# Patient Record
Sex: Female | Born: 1985 | Race: Black or African American | Hispanic: No | Marital: Single | Smoking: Never smoker
Health system: Southern US, Community
[De-identification: ages and names within clinical notes are randomized; demographics above are authoritative.]

## PROBLEM LIST (undated history)

## (undated) DIAGNOSIS — Z17421 Hormone receptor negative with human epidermal growth factor receptor 2 negative status: Secondary | ICD-10-CM

## (undated) DIAGNOSIS — Z7901 Long term (current) use of anticoagulants: Secondary | ICD-10-CM

## (undated) DIAGNOSIS — D696 Thrombocytopenia, unspecified: Secondary | ICD-10-CM

## (undated) DIAGNOSIS — D259 Leiomyoma of uterus, unspecified: Secondary | ICD-10-CM

## (undated) DIAGNOSIS — C50919 Malignant neoplasm of unspecified site of unspecified female breast: Secondary | ICD-10-CM

## (undated) DIAGNOSIS — R7881 Bacteremia: Secondary | ICD-10-CM

## (undated) DIAGNOSIS — B9561 Methicillin susceptible Staphylococcus aureus infection as the cause of diseases classified elsewhere: Secondary | ICD-10-CM

## (undated) DIAGNOSIS — I38 Endocarditis, valve unspecified: Secondary | ICD-10-CM

## (undated) DIAGNOSIS — A64 Unspecified sexually transmitted disease: Secondary | ICD-10-CM

## (undated) DIAGNOSIS — A419 Sepsis, unspecified organism: Secondary | ICD-10-CM

## (undated) DIAGNOSIS — Z1501 Genetic susceptibility to malignant neoplasm of breast: Secondary | ICD-10-CM

## (undated) DIAGNOSIS — N63 Unspecified lump in unspecified breast: Secondary | ICD-10-CM

## (undated) DIAGNOSIS — T829XXA Unspecified complication of cardiac and vascular prosthetic device, implant and graft, initial encounter: Secondary | ICD-10-CM

## (undated) DIAGNOSIS — D509 Iron deficiency anemia, unspecified: Secondary | ICD-10-CM

## (undated) DIAGNOSIS — I2699 Other pulmonary embolism without acute cor pulmonale: Secondary | ICD-10-CM

## (undated) DIAGNOSIS — D84821 Immunodeficiency due to drugs: Secondary | ICD-10-CM

## (undated) DIAGNOSIS — D219 Benign neoplasm of connective and other soft tissue, unspecified: Secondary | ICD-10-CM

## (undated) DIAGNOSIS — Z7969 Long term (current) use of other immunomodulators and immunosuppressants: Secondary | ICD-10-CM

## (undated) DIAGNOSIS — J188 Other pneumonia, unspecified organism: Secondary | ICD-10-CM

## (undated) DIAGNOSIS — Z15068 Genetic susceptibility to other malignant neoplasm of digestive system: Secondary | ICD-10-CM

## (undated) DIAGNOSIS — J189 Pneumonia, unspecified organism: Secondary | ICD-10-CM

## (undated) HISTORY — DX: Unspecified lump in unspecified breast: N63.0

## (undated) HISTORY — PX: TUBAL LIGATION: SHX77

## (undated) HISTORY — DX: Benign neoplasm of connective and other soft tissue, unspecified: D21.9

## (undated) MED FILL — Fosaprepitant Dimeglumine For IV Infusion 150 MG (Base Eq): INTRAVENOUS | Qty: 5 | Status: AC

---
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
## 2006-03-03 ENCOUNTER — Inpatient Hospital Stay: Payer: Self-pay | Admitting: Obstetrics and Gynecology

## 2007-01-19 ENCOUNTER — Emergency Department: Payer: Self-pay | Admitting: Emergency Medicine

## 2007-09-02 ENCOUNTER — Ambulatory Visit: Payer: Self-pay | Admitting: Family Medicine

## 2011-07-29 ENCOUNTER — Encounter: Payer: Self-pay | Admitting: Obstetrics and Gynecology

## 2011-12-04 ENCOUNTER — Observation Stay: Payer: Self-pay | Admitting: Obstetrics and Gynecology

## 2011-12-04 LAB — URINALYSIS, COMPLETE
Bacteria: NONE SEEN
Bilirubin,UR: NEGATIVE
Blood: NEGATIVE
Glucose,UR: NEGATIVE mg/dL (ref 0–75)
Ketone: NEGATIVE
Leukocyte Esterase: NEGATIVE
Nitrite: NEGATIVE

## 2011-12-04 LAB — CBC
HCT: 31.6 % — ABNORMAL LOW (ref 35.0–47.0)
HGB: 10.6 g/dL — ABNORMAL LOW (ref 12.0–16.0)
MCH: 29.5 pg (ref 26.0–34.0)
MCHC: 33.6 g/dL (ref 32.0–36.0)

## 2011-12-04 LAB — COMPREHENSIVE METABOLIC PANEL
Albumin: 2.8 g/dL — ABNORMAL LOW (ref 3.4–5.0)
Alkaline Phosphatase: 79 U/L (ref 50–136)
Anion Gap: 8 (ref 7–16)
BUN: 5 mg/dL — ABNORMAL LOW (ref 7–18)
Bilirubin,Total: 0.3 mg/dL (ref 0.2–1.0)
Calcium, Total: 8.5 mg/dL (ref 8.5–10.1)
Creatinine: 0.38 mg/dL — ABNORMAL LOW (ref 0.60–1.30)
EGFR (Non-African Amer.): >60
Glucose: 77 mg/dL (ref 65–99)
Potassium: 3.8 mmol/L (ref 3.5–5.1)
SGPT (ALT): 17 U/L (ref 12–78)
Total Protein: 6.8 g/dL (ref 6.4–8.2)

## 2011-12-28 LAB — URINE CULTURE

## 2011-12-29 ENCOUNTER — Observation Stay: Payer: Self-pay | Admitting: Obstetrics and Gynecology

## 2012-02-06 ENCOUNTER — Ambulatory Visit: Payer: Self-pay | Admitting: Obstetrics and Gynecology

## 2012-02-06 LAB — CBC WITH DIFFERENTIAL/PLATELET
Basophil #: 0 10*3/uL (ref 0.0–0.1)
Eosinophil #: 0.1 10*3/uL (ref 0.0–0.7)
HCT: 34.6 % — ABNORMAL LOW (ref 35.0–47.0)
Lymphocyte #: 1.4 10*3/uL (ref 1.0–3.6)
MCH: 28.9 pg (ref 26.0–34.0)
MCHC: 32.7 g/dL (ref 32.0–36.0)
MCV: 88 fL (ref 80–100)
Monocyte #: 0.7 x10 3/mm (ref 0.2–0.9)
Monocyte %: 7.7 %
Neutrophil #: 6.5 10*3/uL (ref 1.4–6.5)
Platelet: 169 10*3/uL (ref 150–440)
RDW: 17.4 % — ABNORMAL HIGH (ref 11.5–14.5)

## 2012-02-07 ENCOUNTER — Inpatient Hospital Stay: Payer: Self-pay | Admitting: Obstetrics and Gynecology

## 2012-02-08 LAB — HEMATOCRIT: HCT: 34.1 % — ABNORMAL LOW (ref 35.0–47.0)

## 2012-02-10 LAB — PATHOLOGY REPORT

## 2013-10-31 ENCOUNTER — Emergency Department: Payer: Self-pay | Admitting: Emergency Medicine

## 2014-05-31 NOTE — Op Note (Signed)
PATIENT NAME:  Penny Gomez, Penny Gomez MR#:  [id number] DATE OF BIRTH:  26-Nov-1985  DATE OF PROCEDURE:  02/07/2012  PREOPERATIVE DIAGNOSES:  1. Prior cesarean section.  2. Term for repeat cesarean section.  3. Desires sterilization.  POSTOPERATIVE DIAGNOSES:  1. Prior cesarean section.  2. Term for repeat cesarean section.  3. Desires sterilization.  PROCEDURE: Repeat low transverse cesarean and tubal ligation.   SURGEON: Ricky L. Amalia Hailey, MD  ASSISTANT: Scrub tech, [place].  ANESTHESIA:  Spinal by Dr. Marcello Moores  FINDINGS: Postsurgical abdomen, grossly normal uterus, grossly normal ovaries, left fallopian tube very thin and poorly defined.   ESTIMATED BLOOD LOSS: 650 mL.   COMPLICATIONS: None.   SPECIMENS: Portion of bilateral fallopian tubes, right mid ampullary and the left fimbria.   DRAINS: Foley.   ANTIBIOTICS: One gram of Ancef given IV preoperatively.   PROCEDURE IN DETAIL: The patient was consented. Preoperative antibiotics were given. She was taken to the operating room and placed in the sitting position where spinal was placed and then placed in the supine position, prepped and draped in the usual sterile fashion, after insertion of Foley catheter, and after assuring adequate anesthesia a #10 blade was used to create a Pfannenstiel incision approximately a centimeter below prior incision. Uterine incision was made. Uterine cavity was entered bluntly and bluntly extended. Head of infant was delivered with the aid of vacuum extractor. There was actually seen to be a small laceration on the temporal region. I am unsure how this happened as I made blunt entry into the uterine cavity. The uterus was exteriorized after delivering placenta. Cavity was curettaged with lap. The cervix was open. The incision was closed with running interlocking 0 chromic.   The right tube was visualized and double ligated using 0 plain and a portion was excised, in a relatively avascular portion in the mid  ampullary region. The ends of the tube were cauterized and then a portion was sent to pathology.   Inspection the left fallopian tube again showed a poorly defined tube. Therefore, I elected to proceed with fimbria which was discernible. It was removed in its entirety and clamped, cut and ligated with 0 Vicryl. The stump was seen to be hemostatic. The uterus was returned to the abdominal cavity. Areas were irrigated and all three incision sites were seen to be hemostatic. The rectus muscle was hemostatic. The fascia was closed left to right with 0 Vicryl, subcutaneous made hemostatic with cautery and the skin was closed with clips.   The patient tolerated the procedure well. The pediatrician felt that the laceration on the temple was not deep enough to require a suture and just recommended keeping it clean and Neosporin. I anticipate a routine postoperative course.  ____________________________ Rockey Situ. Amalia Hailey, MD rle:sb D: 02/07/2012 08:56:50 ET T: 02/07/2012 14:05:55 ET JOB#: [id number]  cc: Ricky L. Amalia Hailey, MD, <Dictator> Selmer Dominion MD ELECTRONICALLY SIGNED 02/08/2012 9:38

## 2014-06-03 NOTE — Discharge Summary (Signed)
PATIENT NAME:  Penny Gomez, Penny Gomez MR#:  [id number] DATE OF BIRTH:  25-Aug-1985  DATE OF ADMISSION:  02/07/2012 DATE OF DISCHARGE:  02/09/2012  ADMISSION DIAGNOSES:  1.  Term gestation.  2.  Prior cesarean section, desires repeat.  3.  Desires sterilization.  DISCHARGE DIAGNOSIS:  1.  Term gestation.  2.  Prior cesarean section, desires repeat.  3.  Desires sterilization.  PROCEDURE: Repeat cesarean section and bilateral tubal ligation. Please see operative note.   HOSPITAL COURSE: Unremarkable. She was tolerating diet from day 1. She was discharged home on day 3 with routine prescriptions, precautions, and followup.    ____________________________ Rockey Situ. Amalia Hailey, MD rle:aw D: 02/19/2012 08:07:19 ET T: 02/19/2012 09:19:00 ET JOB#: [id number]  cc: Ricky L. Amalia Hailey, MD, <Dictator> Selmer Dominion MD ELECTRONICALLY SIGNED 02/21/2012 17:43

## 2014-12-01 ENCOUNTER — Encounter: Payer: Self-pay | Admitting: Emergency Medicine

## 2014-12-01 ENCOUNTER — Emergency Department
Admission: EM | Admit: 2014-12-01 | Discharge: 2014-12-01 | Disposition: A | Discharge status: Home / Self Care | Payer: BC Managed Care – PPO | Attending: Emergency Medicine | Admitting: Emergency Medicine

## 2014-12-01 DIAGNOSIS — B373 Candidiasis of vulva and vagina: Secondary | ICD-10-CM

## 2014-12-01 DIAGNOSIS — Z3202 Encounter for pregnancy test, result negative: Secondary | ICD-10-CM | POA: Insufficient documentation

## 2014-12-01 DIAGNOSIS — B3731 Acute candidiasis of vulva and vagina: Secondary | ICD-10-CM

## 2014-12-01 DIAGNOSIS — M545 Low back pain: Secondary | ICD-10-CM | POA: Insufficient documentation

## 2014-12-01 LAB — URINALYSIS COMPLETE WITH MICROSCOPIC (ARMC ONLY)
Bilirubin Urine: NEGATIVE
Glucose, UA: NEGATIVE mg/dL
HGB URINE DIPSTICK: NEGATIVE
Ketones, ur: NEGATIVE mg/dL
Nitrite: NEGATIVE
PH: 5 (ref 5.0–8.0)
Protein, ur: NEGATIVE mg/dL
Specific Gravity, Urine: 1.024 (ref 1.005–1.030)

## 2014-12-01 LAB — CHLAMYDIA/NGC RT PCR (ARMC ONLY)
CHLAMYDIA TR: NOT DETECTED
N gonorrhoeae: NOT DETECTED

## 2014-12-01 LAB — WET PREP, GENITAL
CLUE CELLS WET PREP: NONE SEEN
TRICH WET PREP: NONE SEEN

## 2014-12-01 LAB — POCT PREGNANCY, URINE: Preg Test, Ur: NEGATIVE

## 2014-12-01 MED ORDER — IBUPROFEN 600 MG PO TABS: 600.0000 mg | ORAL_TABLET | Freq: Three times a day (TID) | ORAL | Status: DC

## 2014-12-01 MED ORDER — FLUCONAZOLE 150 MG PO TABS: 150.0000 mg | ORAL_TABLET | Freq: Every day | ORAL | Status: DC

## 2014-12-01 NOTE — ED Notes (Signed)
Developed lower back pain w/o injury about 1 week ago. Ambulates well to treatment room.also had some urinary sx's several days ago

## 2014-12-01 NOTE — ED Provider Notes (Signed)
[facility] Emergency Department Provider Note  ____________________________________________  Time seen: Approximately 7:44 AM  I have reviewed the triage vital signs and the nursing notes.   HISTORY  Chief Complaint Back Pain   HPI Penny Gomez is a 29 y.o. female is here with complaint of low back pain for one week without history of injury. Patient also has urinary symptoms for several days. She states that there is some burning with urination and vaginal discharge. Patient admits that she has multiple sex partners and does not always use a condom. Yesterday she used some Monistat without relief.Patient also has some low back pain without history of injury. Back pain has been going on for approximately [redacted] weeks along with her dysuria. Patient rates her pain currently is 8 out of 10.   History reviewed. No pertinent past medical history.  There are no active problems to display for this patient.   History reviewed. No pertinent past surgical history.  Current Outpatient Rx  Name  Route  Sig  Dispense  Refill  . fluconazole (DIFLUCAN) 150 MG tablet   Oral   Take 1 tablet (150 mg total) by mouth daily.   1 tablet   0   . ibuprofen (ADVIL,MOTRIN) 600 MG tablet   Oral   Take 1 tablet (600 mg total) by mouth 3 (three) times daily.   30 tablet   0     Allergies Review of patient's allergies indicates no known allergies.  History reviewed. No pertinent family history.  Social History Social History  Substance Use Topics  . Smoking status: Never Smoker   . Smokeless tobacco: None  . Alcohol Use: No    Review of Systems Constitutional: No fever/chills Eyes: No visual changes. ENT: No sore throat. Cardiovascular: Denies chest pain. Respiratory: Denies shortness of breath. Gastrointestinal: No abdominal pain.  No nausea, no vomiting. . Genitourinary: Negative for dysuria. Musculoskeletal: Positive for low back pain Skin: Negative for  rash. Neurological: Negative for headaches, focal weakness or numbness.  10-point ROS otherwise negative.  ____________________________________________   PHYSICAL EXAM:  VITAL SIGNS: ED Triage Vitals  Enc Vitals Group     BP 12/01/14 0723 129/72 mmHg     Pulse Rate 12/01/14 0723 100     Resp 12/01/14 0723 20     Temp 12/01/14 0723 98.8 F (37.1 C)     Temp Source 12/01/14 0723 Oral     SpO2 12/01/14 0723 96 %     Weight 12/01/14 0723 133 lb (60.328 kg)     Height 12/01/14 0723 5' (1.524 m)     Head Cir --      Peak Flow --      Pain Score 12/01/14 0724 8     Pain Loc --      Pain Edu? --      Excl. in [place]? --     Constitutional: Alert and oriented. Well appearing and in no acute distress. Eyes: Conjunctivae are normal. PERRL. EOMI. Head: Atraumatic. Nose: No congestion/rhinnorhea. Neck: No stridor.   Cardiovascular: Normal rate, regular rhythm. Grossly normal heart sounds.  Good peripheral circulation. Respiratory: Normal respiratory effort.  No retractions. Lungs CTAB. Gastrointestinal: Soft and nontender. No distention.  Musculoskeletal: No lower extremity tenderness nor edema.   Neurologic:  Normal speech and language. No gross focal neurologic deficits are appreciated. No gait instability. Skin:  Skin is warm, dry and intact. No rash noted. Psychiatric: Mood and affect are normal. Speech and behavior are normal.  ____________________________________________   LABS (all labs ordered are listed, but only abnormal results are displayed)  Labs Reviewed  WET PREP, GENITAL - Abnormal; Notable for the following:    Yeast Wet Prep HPF POC MODERATE (*)    WBC, Wet Prep HPF POC MANY (*)    All other components within normal limits  URINALYSIS COMPLETEWITH MICROSCOPIC ([facility] ONLY) - Abnormal; Notable for the following:    Color, Urine YELLOW (*)    APPearance HAZY (*)    Leukocytes, UA 1+ (*)    Bacteria, UA RARE (*)    Squamous Epithelial / LPF 6-30 (*)    All  other components within normal limits  CHLAMYDIA/NGC RT PCR ([facility] ONLY)  POC URINE PREG, ED  POCT PREGNANCY, URINE    PROCEDURES  Procedure(s) performed: None  Critical Care performed: No  ____________________________________________   INITIAL IMPRESSION / ASSESSMENT AND PLAN / ED COURSE  Pertinent labs & imaging results that were available during my care of the patient were reviewed by me and considered in my medical decision making (see chart for details).  Patient was made aware that her urinalysis did not show any suspicion for urinary tract infection. She did however have moderate yeast on her wet prep. Patient was treated with Diflucan and told to follow-up with her PCP or health Department. We also discussed a visit to the health Department because of her multiple sex partners and occasional use of condoms. Patient was told that should her gonorrhea and chlamydia culture come back positive she would get a phone call informing her that she would need to come back for treatment. ____________________________________________   FINAL CLINICAL IMPRESSION(S) / ED DIAGNOSES  Final diagnoses:  Yeast vaginitis  Low back pain without sciatica, unspecified back pain laterality      Johnn Hai, PA-C 12/01/14 [place], MD 12/01/14 1250

## 2014-12-01 NOTE — Discharge Instructions (Signed)
Follow-up with the health department if any continued problems. Take Diflucan as directed. Sexual activity for one week

## 2014-12-01 NOTE — ED Notes (Signed)
Pt to ed with c/o lower back pain x 2 weeks.  Pt states about 2 weeks ago she had burning with urination and took something over the counter, then had vaginal discharge, took monistat but back pain has remained.  Pt denies injury.

## 2016-01-12 ENCOUNTER — Encounter: Payer: Self-pay | Admitting: Emergency Medicine

## 2016-01-12 ENCOUNTER — Emergency Department
Admission: EM | Admit: 2016-01-12 | Discharge: 2016-01-12 | Disposition: A | Discharge status: Home / Self Care | Payer: BC Managed Care – PPO | Attending: Emergency Medicine | Admitting: Emergency Medicine

## 2016-01-12 DIAGNOSIS — N898 Other specified noninflammatory disorders of vagina: Secondary | ICD-10-CM | POA: Diagnosis present

## 2016-01-12 DIAGNOSIS — Z791 Long term (current) use of non-steroidal anti-inflammatories (NSAID): Secondary | ICD-10-CM | POA: Diagnosis not present

## 2016-01-12 DIAGNOSIS — B373 Candidiasis of vulva and vagina: Secondary | ICD-10-CM | POA: Insufficient documentation

## 2016-01-12 DIAGNOSIS — A5901 Trichomonal vulvovaginitis: Secondary | ICD-10-CM | POA: Diagnosis not present

## 2016-01-12 DIAGNOSIS — B3731 Acute candidiasis of vulva and vagina: Secondary | ICD-10-CM

## 2016-01-12 DIAGNOSIS — A599 Trichomoniasis, unspecified: Secondary | ICD-10-CM

## 2016-01-12 LAB — URINALYSIS COMPLETE WITH MICROSCOPIC (ARMC ONLY)
Bilirubin Urine: NEGATIVE
Glucose, UA: NEGATIVE mg/dL
Hgb urine dipstick: NEGATIVE
Ketones, ur: NEGATIVE mg/dL
Nitrite: NEGATIVE
Protein, ur: NEGATIVE mg/dL
Specific Gravity, Urine: 1.016 (ref 1.005–1.030)
pH: 5 (ref 5.0–8.0)

## 2016-01-12 LAB — WET PREP, GENITAL
Clue Cells Wet Prep HPF POC: NONE SEEN
Sperm: NONE SEEN

## 2016-01-12 LAB — CHLAMYDIA/NGC RT PCR (ARMC ONLY)
Chlamydia Tr: NOT DETECTED
N gonorrhoeae: NOT DETECTED

## 2016-01-12 LAB — PREGNANCY, URINE: Preg Test, Ur: NEGATIVE

## 2016-01-12 MED ORDER — FLUCONAZOLE 150 MG PO TABS
150.0000 mg | ORAL_TABLET | Freq: Every day | ORAL | 0 refills | Status: DC
Start: 2016-01-12 — End: 2019-05-15

## 2016-01-12 MED ORDER — METRONIDAZOLE 500 MG PO TABS: 500.0000 mg | ORAL_TABLET | Freq: Two times a day (BID) | ORAL | 0 refills | Status: DC

## 2016-01-12 NOTE — ED Provider Notes (Signed)
[facility] Emergency Department Provider Note  ____________________________________________   First MD Initiated Contact with Patient 01/12/16 1459     (approximate)  I have reviewed the triage vital signs and the nursing notes.   HISTORY  Chief Complaint Dysuria and Vaginal Discharge    HPI Penny Gomez is a 30 y.o. female presenting to the emergency department with increased vaginal discharge for the past week. Patient has a history of yeast vaginitis. She was diagnosed with trichomoniasis approximately 2 years ago. Patient states that she has been sexually active with one partner who is a "friend". Patient has had unprotected sex on multiple occasions. Patient reports dysuria. She denies costovertebral angle tenderness and fever. She denies nausea and vomiting. She has not attempted alleviating measures.   No past medical history on file.  There are no active problems to display for this patient.   No past surgical history on file.  Prior to Admission medications   Medication Sig Start Date End Date Taking? Authorizing Provider  fluconazole (DIFLUCAN) 150 MG tablet Take 1 tablet (150 mg total) by mouth daily. 01/12/16   Lannie Fields, PA-C  ibuprofen (ADVIL,MOTRIN) 600 MG tablet Take 1 tablet (600 mg total) by mouth 3 (three) times daily. 12/01/14   Johnn Hai, PA-C  metroNIDAZOLE (FLAGYL) 500 MG tablet Take 1 tablet (500 mg total) by mouth 2 (two) times daily. 01/12/16   Lannie Fields, PA-C    Allergies Patient has no known allergies.  No family history on file.  Social History Social History  Substance Use Topics  . Smoking status: Never Smoker  . Smokeless tobacco: Not on file  . Alcohol use No    Review of Systems Constitutional: No fever/chills ENT: No sore throat. Cardiovascular: Denies chest pain. Respiratory: Denies shortness of breath. Gastrointestinal: No abdominal pain.  No nausea, no vomiting.  No diarrhea.  No  constipation. Genitourinary: Patient has dysuria and increased vaginal discharge. Musculoskeletal: Negative for back pain. Skin: Negative for rash. Neurological: Negative for headaches, focal weakness or numbness.  10-point ROS otherwise negative.  ____________________________________________   PHYSICAL EXAM:  VITAL SIGNS: ED Triage Vitals  Enc Vitals Group     BP 01/12/16 1402 138/77     Pulse Rate 01/12/16 1402 82     Resp 01/12/16 1402 16     Temp 01/12/16 1402 99.4 F (37.4 C)     Temp Source 01/12/16 1402 Oral     SpO2 01/12/16 1402 100 %     Weight 01/12/16 1403 136 lb (61.7 kg)     Height 01/12/16 1403 5\' 1"  (1.549 m)     Head Circumference --      Peak Flow --      Pain Score --      Pain Loc --      Pain Edu? --      Excl. in [place]? --    Constitutional: Alert and oriented. Well appearing and in no acute distress. Hematological/Lymphatic/Immunilogical: No cervical lymphadenopathy. Cardiovascular: Normal rate, regular rhythm. Grossly normal heart sounds.  Good peripheral circulation. Respiratory: Normal respiratory effort.  No retractions. Lungs CTAB. Gastrointestinal: Soft and nontender. No distention. No abdominal bruits. No CVA tenderness. Genitourinary: Patient has no cervical motion tenderness on physical exam. Excessive mucopurulent discharge is visualized. Labia minora is erythematous bilaterally. No punctate lesions visualized. No other findings on pelvic exam. Skin:  Skin is warm, dry and intact. No rash noted. Psychiatric: Mood and affect are normal. Speech and behavior  are normal.  ____________________________________________   LABS (all labs ordered are listed, but only abnormal results are displayed)  Labs Reviewed  WET PREP, GENITAL - Abnormal; Notable for the following:       Result Value   Yeast Wet Prep HPF POC PRESENT (*)    Trich, Wet Prep PRESENT (*)    WBC, Wet Prep HPF POC MODERATE (*)    All other components within normal limits    URINALYSIS COMPLETEWITH MICROSCOPIC ([facility] ONLY) - Abnormal; Notable for the following:    Color, Urine YELLOW (*)    APPearance HAZY (*)    Leukocytes, UA 3+ (*)    Bacteria, UA RARE (*)    Squamous Epithelial / LPF 6-30 (*)    All other components within normal limits  CHLAMYDIA/NGC RT PCR ([facility] ONLY)  PREGNANCY, URINE   ____________________________________________  Procedures Pelvic Exam    ____________________________________________   INITIAL IMPRESSION / ASSESSMENT AND PLAN / ED COURSE  Pertinent labs & imaging results that were available during my care of the patient were reviewed by me and considered in my medical decision making (see chart for details).   Clinical Course    Assessment and plan: Trichomoniasis Yeast vaginitis Differential diagnosis includes yeast vaginitis, UTI, bacterial vaginosis, and sexually transmitted disease. She has no cervical motion tenderness, fever, nausea or vomiting. I am not suspicious for PID at this time. Findings on urinalysis do not support urinary tract infection. Wet prep findings are consistent with yeast vaginitis and trichomoniasis. Patient was treated with metronidazole and Diflucan. Patient's vital signs are reassuring at this time. She was advised to follow up with her primary care provider. Explicit patient education was given regarding safe sex practices. Strict return precautions were given. All patient questions were answered.   ____________________________________________   FINAL CLINICAL IMPRESSION(S) / ED DIAGNOSES  Final diagnoses:  Yeast vaginitis  Trichimoniasis      NEW MEDICATIONS STARTED DURING THIS VISIT:  New Prescriptions   FLUCONAZOLE (DIFLUCAN) 150 MG TABLET    Take 1 tablet (150 mg total) by mouth daily.   METRONIDAZOLE (FLAGYL) 500 MG TABLET    Take 1 tablet (500 mg total) by mouth 2 (two) times daily.     Note:  This document was prepared using Dragon voice recognition software and  may include unintentional dictation errors.    Lannie Fields, PA-C 01/12/16 [place], MD 01/12/16 2158

## 2016-01-12 NOTE — ED Triage Notes (Signed)
Pt reports pain upon urination that began today. Pt reports white vaginal discharge for approximately one week. Pt reports she has had unprotected sex. Pt denies NVD. Pt denies abdominal pain. Pt denies fever at home.

## 2017-05-05 ENCOUNTER — Other Ambulatory Visit: Payer: Self-pay | Admitting: Family Medicine

## 2017-05-05 DIAGNOSIS — Z803 Family history of malignant neoplasm of breast: Secondary | ICD-10-CM

## 2019-02-25 ENCOUNTER — Other Ambulatory Visit: Payer: Self-pay | Admitting: Family Medicine

## 2019-02-25 DIAGNOSIS — Z1231 Encounter for screening mammogram for malignant neoplasm of breast: Secondary | ICD-10-CM

## 2019-04-07 ENCOUNTER — Ambulatory Visit
Admission: RE | Admit: 2019-04-07 | Discharge: 2019-04-07 | Disposition: A | Discharge status: Home / Self Care | Payer: Managed Care, Other (non HMO) | Source: Ambulatory Visit | Attending: Family Medicine | Admitting: Family Medicine

## 2019-04-07 DIAGNOSIS — Z1231 Encounter for screening mammogram for malignant neoplasm of breast: Secondary | ICD-10-CM | POA: Insufficient documentation

## 2019-04-09 ENCOUNTER — Other Ambulatory Visit: Payer: Self-pay | Admitting: Family Medicine

## 2019-04-09 DIAGNOSIS — R928 Other abnormal and inconclusive findings on diagnostic imaging of breast: Secondary | ICD-10-CM

## 2019-04-09 DIAGNOSIS — N6310 Unspecified lump in the right breast, unspecified quadrant: Secondary | ICD-10-CM

## 2019-05-05 ENCOUNTER — Ambulatory Visit
Admission: RE | Admit: 2019-05-05 | Discharge: 2019-05-05 | Disposition: A | Discharge status: Home / Self Care | Payer: Managed Care, Other (non HMO) | Source: Ambulatory Visit | Attending: Family Medicine | Admitting: Family Medicine

## 2019-05-05 DIAGNOSIS — R928 Other abnormal and inconclusive findings on diagnostic imaging of breast: Secondary | ICD-10-CM

## 2019-05-05 DIAGNOSIS — N6310 Unspecified lump in the right breast, unspecified quadrant: Secondary | ICD-10-CM | POA: Insufficient documentation

## 2019-05-13 ENCOUNTER — Other Ambulatory Visit: Payer: Self-pay | Admitting: Family Medicine

## 2019-05-13 DIAGNOSIS — R928 Other abnormal and inconclusive findings on diagnostic imaging of breast: Secondary | ICD-10-CM

## 2019-05-13 DIAGNOSIS — N6489 Other specified disorders of breast: Secondary | ICD-10-CM

## 2019-05-15 ENCOUNTER — Emergency Department
Admission: EM | Admit: 2019-05-15 | Discharge: 2019-05-15 | Disposition: A | Discharge status: Home / Self Care | Payer: Managed Care, Other (non HMO) | Attending: Emergency Medicine | Admitting: Emergency Medicine

## 2019-05-15 ENCOUNTER — Other Ambulatory Visit: Payer: Self-pay

## 2019-05-15 ENCOUNTER — Encounter: Payer: Self-pay | Admitting: Emergency Medicine

## 2019-05-15 DIAGNOSIS — B9689 Other specified bacterial agents as the cause of diseases classified elsewhere: Secondary | ICD-10-CM | POA: Diagnosis not present

## 2019-05-15 DIAGNOSIS — N760 Acute vaginitis: Secondary | ICD-10-CM | POA: Diagnosis not present

## 2019-05-15 DIAGNOSIS — N898 Other specified noninflammatory disorders of vagina: Secondary | ICD-10-CM | POA: Diagnosis present

## 2019-05-15 LAB — WET PREP, GENITAL
Sperm: NONE SEEN
Trich, Wet Prep: NONE SEEN
Yeast Wet Prep HPF POC: NONE SEEN

## 2019-05-15 LAB — URINALYSIS, COMPLETE (UACMP) WITH MICROSCOPIC
Bilirubin Urine: NEGATIVE
Glucose, UA: NEGATIVE mg/dL
Hgb urine dipstick: NEGATIVE
Ketones, ur: NEGATIVE mg/dL
Leukocytes,Ua: NEGATIVE
Nitrite: NEGATIVE
Protein, ur: NEGATIVE mg/dL
Specific Gravity, Urine: 1.005 (ref 1.005–1.030)
pH: 7 (ref 5.0–8.0)

## 2019-05-15 LAB — CHLAMYDIA/NGC RT PCR (ARMC ONLY): Chlamydia Tr: NOT DETECTED

## 2019-05-15 LAB — POCT PREGNANCY, URINE: Preg Test, Ur: NEGATIVE

## 2019-05-15 LAB — CHLAMYDIA/NGC RT PCR (ARMC ONLY)??????????: N gonorrhoeae: NOT DETECTED

## 2019-05-15 MED ORDER — METRONIDAZOLE 500 MG PO TABS
500.0000 mg | ORAL_TABLET | Freq: Once | ORAL | Status: AC
  Administered 2019-05-15: 500 mg via ORAL
  Filled 2019-05-15: qty 1

## 2019-05-15 MED ORDER — METRONIDAZOLE 500 MG PO TABS: 500.0000 mg | ORAL_TABLET | Freq: Two times a day (BID) | ORAL | 0 refills | Status: AC

## 2019-05-15 NOTE — Discharge Instructions (Addendum)
Take the antibiotic as directed. Follow-up with your provider for ongoing symptoms. Return as needed.

## 2019-05-15 NOTE — ED Provider Notes (Addendum)
[facility] Emergency Department Provider Note ____________________________________________  Time seen: 69  I have reviewed the triage vital signs and the nursing notes.  HISTORY  Chief Complaint  Vaginal Discharge  HPI Penny Gomez is a 34 y.o. female presents herself to the ED for evaluation of 2 to 3-day complaint of malodorous vaginal discharge.  Patient denies any pelvic pain, dysuria, hematuria, or fevers.   History reviewed. No pertinent past medical history.  There are no problems to display for this patient.  History reviewed. No pertinent surgical history.  Prior to Admission medications   Medication Sig Start Date End Date Taking? Authorizing Provider  metroNIDAZOLE (FLAGYL) 500 MG tablet Take 1 tablet (500 mg total) by mouth 2 (two) times daily for 7 days. 05/15/19 05/22/19  Jarrin Staley, Dannielle Karvonen, PA-C    Allergies Patient has no known allergies.  Family History  Problem Relation Age of Onset  . Breast cancer Mother 60  . Breast cancer Maternal Aunt 33  . Breast cancer Cousin 80    Social History Social History   Tobacco Use  . Smoking status: Never Smoker  . Smokeless tobacco: Never Used  Substance Use Topics  . Alcohol use: No  . Drug use: No    Review of Systems  Constitutional: Negative for fever. Cardiovascular: Negative for chest pain. Respiratory: Negative for shortness of breath. Gastrointestinal: Negative for abdominal pain, vomiting and diarrhea. Genitourinary: Negative for dysuria. Reports vaginal discharge as above. Musculoskeletal: Negative for back pain. Skin: Negative for rash. Neurological: Negative for headaches, focal weakness or numbness. ____________________________________________  PHYSICAL EXAM:  VITAL SIGNS: ED Triage Vitals [05/15/19 1735]  Enc Vitals Group     BP (!) 144/86     Pulse Rate 86     Resp 16     Temp 98.6 F (37 C)     Temp src      SpO2 100 %     Weight 142 lb (64.4 kg)      Height 5\' 1"  (1.549 m)     Head Circumference      Peak Flow      Pain Score 0     Pain Loc      Pain Edu?      Excl. in [place]?     Constitutional: Alert and oriented. Well appearing and in no distress. Head: Normocephalic and atraumatic. Eyes: Conjunctivae are normal. Normal extraocular movements Cardiovascular: Normal rate, regular rhythm. Normal distal pulses. Respiratory: Normal respiratory effort. No wheezes/rales/rhonchi. GU: normal external genitalia. Milky, white discharge noted in the vault. Cervix with a mucoid discharge at the os. No CMT or adnexal masses noted.  Musculoskeletal: Nontender with normal range of motion in all extremities.  Neurologic:  Normal gait without ataxia. Normal speech and language. No gross focal neurologic deficits are appreciated. Skin:  Skin is warm, dry and intact. No rash noted. Psychiatric: Mood and affect are normal. Patient exhibits appropriate insight and judgment. ____________________________________________   LABS (pertinent positives/negatives)  Labs Reviewed  WET PREP, GENITAL - Abnormal; Notable for the following components:      Result Value   Clue Cells Wet Prep HPF POC PRESENT (*)    WBC, Wet Prep HPF POC FEW (*)    All other components within normal limits  URINALYSIS, COMPLETE (UACMP) WITH MICROSCOPIC - Abnormal; Notable for the following components:   Color, Urine YELLOW (*)    APPearance HAZY (*)    Bacteria, UA RARE (*)    All other components  within normal limits  CHLAMYDIA/NGC RT PCR ([facility] ONLY)  POC URINE PREG, ED  POCT PREGNANCY, URINE  ____________________________________________  PROCEDURES  Metronidazole 500 mg p.o.  Procedures ____________________________________________  INITIAL IMPRESSION / ASSESSMENT AND PLAN / ED COURSE  Patient with ED evaluation of a 2 to 3-day complaint of malodorous vaginal discharge.  Patient clinical picture was without any signs of acute PID.  Patient did have a wet  prep that revealed some clue cells.  She was treated with metronidazole orally.  She will follow up with her primary provider return to the ED as necessary.  GC culture is pending at time of discharge.  Penny Gomez was evaluated in Emergency Department on 05/15/2019 for the symptoms described in the history of present illness. She was evaluated in the context of the global COVID-19 pandemic, which necessitated consideration that the patient might be at risk for infection with the SARS-CoV-2 virus that causes COVID-19. Institutional protocols and algorithms that pertain to the evaluation of patients at risk for COVID-19 are in a state of rapid change based on information released by regulatory bodies including the CDC and federal and state organizations. These policies and algorithms were followed during the patient's care in the ED. ____________________________________________  FINAL CLINICAL IMPRESSION(S) / ED DIAGNOSES  Final diagnoses:  BV (bacterial vaginosis)      Carmie End, Dannielle Karvonen, PA-C 05/15/19 [address] Dannielle Karvonen, PA-C 05/15/19 1958    [name], MD 05/15/19 2104

## 2019-05-15 NOTE — ED Triage Notes (Signed)
Pt states vaginal discharge x 3-4 days that has an odor to it

## 2019-05-15 NOTE — ED Notes (Addendum)
Pelvic cart placed at bedside   Pt provided urine specimen cup and ambulated to restroom with steady gait.

## 2019-05-20 ENCOUNTER — Ambulatory Visit
Admission: RE | Admit: 2019-05-20 | Discharge: 2019-05-20 | Disposition: A | Discharge status: Home / Self Care | Payer: Managed Care, Other (non HMO) | Source: Ambulatory Visit | Attending: Family Medicine | Admitting: Family Medicine

## 2019-05-20 DIAGNOSIS — N6489 Other specified disorders of breast: Secondary | ICD-10-CM | POA: Diagnosis present

## 2019-05-20 DIAGNOSIS — R928 Other abnormal and inconclusive findings on diagnostic imaging of breast: Secondary | ICD-10-CM | POA: Diagnosis present

## 2019-05-20 HISTORY — PX: BREAST BIOPSY: SHX20

## 2019-05-21 LAB — SURGICAL PATHOLOGY

## 2020-03-20 ENCOUNTER — Other Ambulatory Visit: Payer: Self-pay

## 2020-03-20 ENCOUNTER — Emergency Department: Payer: Self-pay

## 2020-03-20 ENCOUNTER — Emergency Department
Admission: EM | Admit: 2020-03-20 | Discharge: 2020-03-20 | Disposition: A | Discharge status: Home / Self Care | Payer: Self-pay | Attending: Emergency Medicine | Admitting: Emergency Medicine

## 2020-03-20 DIAGNOSIS — Q524 Other congenital malformations of vagina: Secondary | ICD-10-CM | POA: Insufficient documentation

## 2020-03-20 DIAGNOSIS — R52 Pain, unspecified: Secondary | ICD-10-CM

## 2020-03-20 DIAGNOSIS — R109 Unspecified abdominal pain: Secondary | ICD-10-CM | POA: Insufficient documentation

## 2020-03-20 LAB — CBC WITH DIFFERENTIAL/PLATELET
Abs Immature Granulocytes: 0.03 10*3/uL (ref 0.00–0.07)
Basophils Absolute: 0 10*3/uL (ref 0.0–0.1)
Basophils Relative: 0 %
Eosinophils Absolute: 0 10*3/uL (ref 0.0–0.5)
Eosinophils Relative: 0 %
HCT: 35.8 % — ABNORMAL LOW (ref 36.0–46.0)
Hemoglobin: 11.4 g/dL — ABNORMAL LOW (ref 12.0–15.0)
Immature Granulocytes: 0 %
Lymphocytes Relative: 19 %
Lymphs Abs: 2.1 10*3/uL (ref 0.7–4.0)
MCH: 25.3 pg — ABNORMAL LOW (ref 26.0–34.0)
MCHC: 31.8 g/dL (ref 30.0–36.0)
MCV: 79.4 fL — ABNORMAL LOW (ref 80.0–100.0)
Monocytes Absolute: 0.6 10*3/uL (ref 0.1–1.0)
Monocytes Relative: 6 %
Neutro Abs: 8.3 10*3/uL — ABNORMAL HIGH (ref 1.7–7.7)
Neutrophils Relative %: 75 %
Platelets: 259 10*3/uL (ref 150–400)
RBC: 4.51 MIL/uL (ref 3.87–5.11)
RDW: 15.6 % — ABNORMAL HIGH (ref 11.5–15.5)
WBC: 11.1 10*3/uL — ABNORMAL HIGH (ref 4.0–10.5)
nRBC: 0 % (ref 0.0–0.2)

## 2020-03-20 LAB — URINALYSIS, COMPLETE (UACMP) WITH MICROSCOPIC
Bilirubin Urine: NEGATIVE
Glucose, UA: NEGATIVE mg/dL
Ketones, ur: NEGATIVE mg/dL
Nitrite: NEGATIVE
Protein, ur: >=300 mg/dL — AB
RBC / HPF: >50 RBC/hpf — ABNORMAL HIGH (ref 0–5)
Specific Gravity, Urine: 1.019 (ref 1.005–1.030)
pH: 6 (ref 5.0–8.0)

## 2020-03-20 LAB — PREGNANCY, URINE: Preg Test, Ur: NEGATIVE

## 2020-03-20 LAB — WET PREP, GENITAL
Clue Cells Wet Prep HPF POC: NONE SEEN
Sperm: NONE SEEN
Trich, Wet Prep: NONE SEEN
Yeast Wet Prep HPF POC: NONE SEEN

## 2020-03-20 LAB — COMPREHENSIVE METABOLIC PANEL
ALT: 14 U/L (ref 0–44)
AST: 20 U/L (ref 15–41)
Albumin: 4 g/dL (ref 3.5–5.0)
Alkaline Phosphatase: 68 U/L (ref 38–126)
Anion gap: 9 (ref 5–15)
BUN: 13 mg/dL (ref 6–20)
CO2: 20 mmol/L — ABNORMAL LOW (ref 22–32)
Calcium: 8.8 mg/dL — ABNORMAL LOW (ref 8.9–10.3)
Chloride: 107 mmol/L (ref 98–111)
Creatinine, Ser: 0.64 mg/dL (ref 0.44–1.00)
GFR, Estimated: >60 mL/min (ref >60)
Glucose, Bld: 101 mg/dL — ABNORMAL HIGH (ref 70–99)
Potassium: 3.9 mmol/L (ref 3.5–5.1)
Sodium: 136 mmol/L (ref 135–145)
Total Bilirubin: 0.5 mg/dL (ref 0.3–1.2)
Total Protein: 7.8 g/dL (ref 6.5–8.1)

## 2020-03-20 LAB — CHLAMYDIA/NGC RT PCR (ARMC ONLY)
Chlamydia Tr: NOT DETECTED
N gonorrhoeae: NOT DETECTED

## 2020-03-20 IMAGING — CT CT ABD-PELV W/ CM
2 of 4 series · 15 of 46 positions shown, 17 images · IV contrast (APPLIED)
Comparison: None.

CLINICAL DATA: Dysuria, discharge

EXAM:
CT ABDOMEN AND PELVIS WITH CONTRAST
TECHNIQUE: Multidetector CT imaging of the abdomen and pelvis was performed
using the standard protocol following bolus administration of
intravenous contrast.
CONTRAST:  100mL OMNIPAQUE IOHEXOL 300 MG/ML  SOLN

[Series 2: routine abd/pel with · axial · 0.66mm/px · z∈[-472,-52]mm · 12 of 92 slices shown, 14 images]
[im 4/92  soft-tissue]
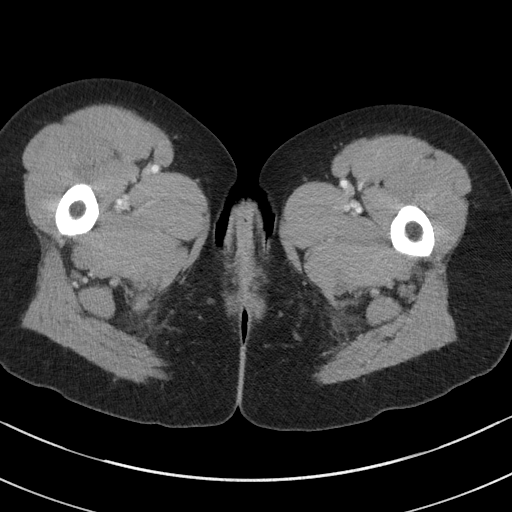
[im 4/92  bone]
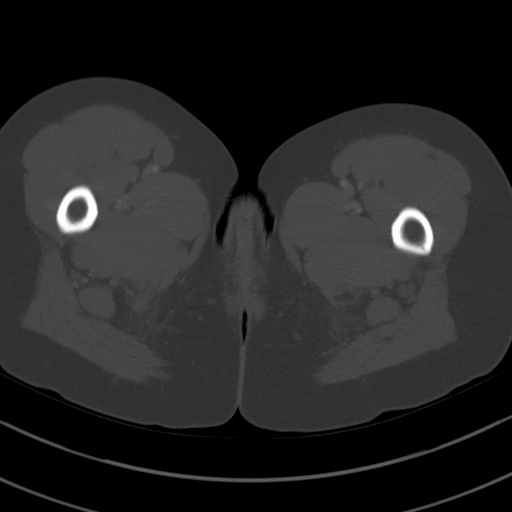
[im 12/92  soft-tissue]
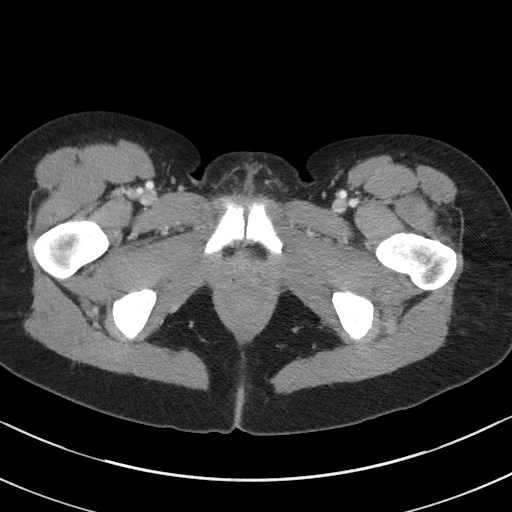
[im 19/92  soft-tissue]
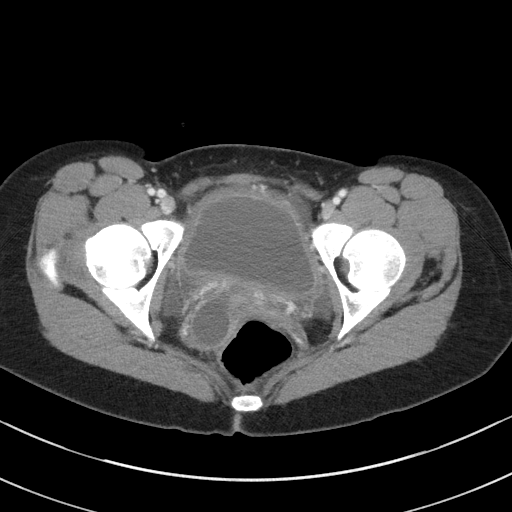
[im 27/92  soft-tissue]
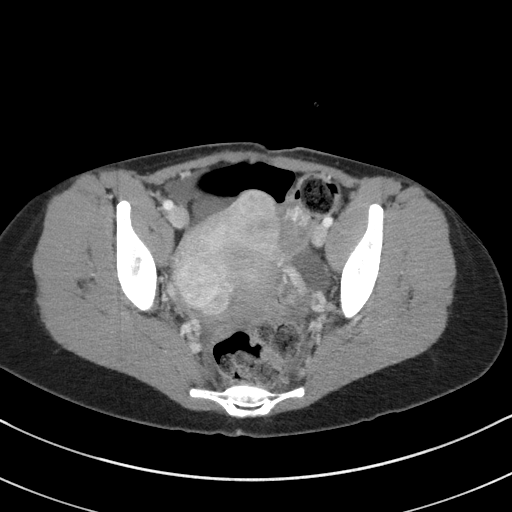
[im 35/92  soft-tissue]
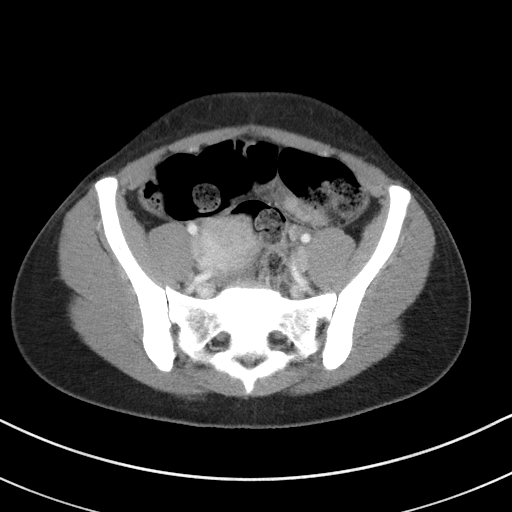
[im 42/92  soft-tissue]
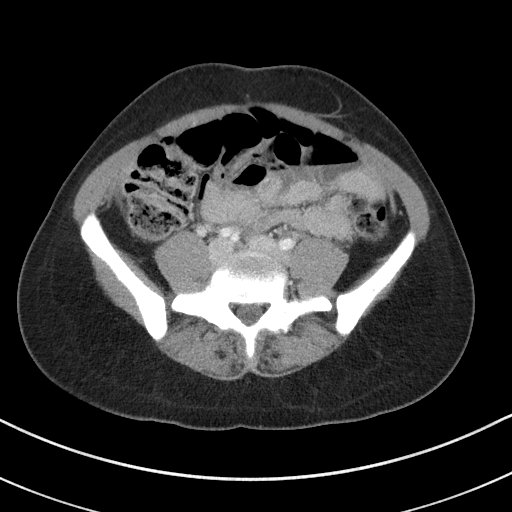
[im 50/92  soft-tissue]
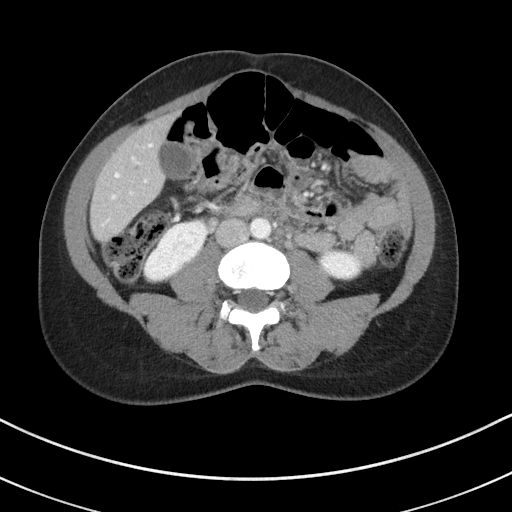
[im 57/92  soft-tissue]
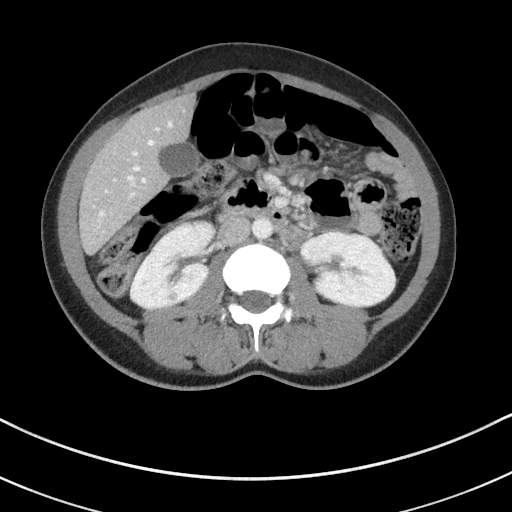
[im 65/92  soft-tissue]
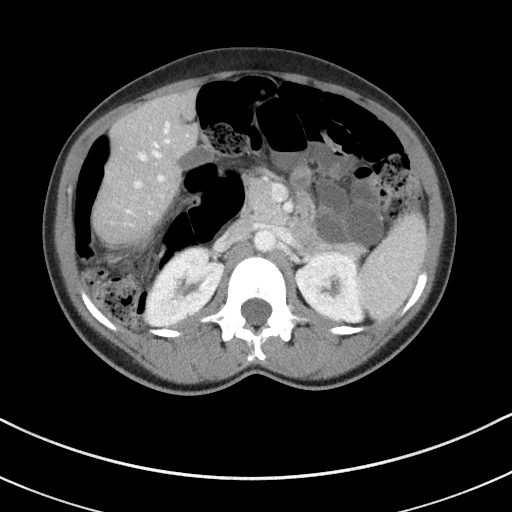
[im 65/92  bone]
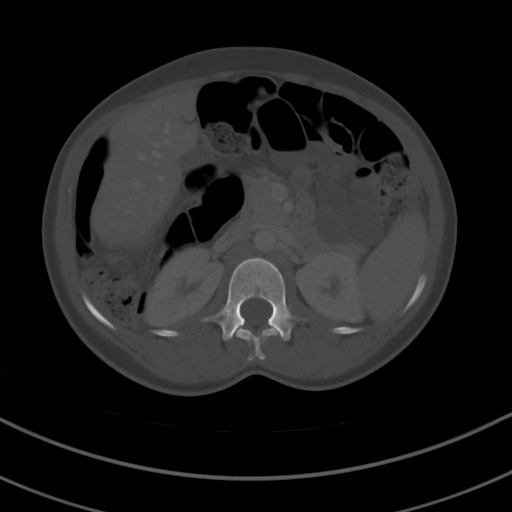
[im 73/92  soft-tissue]
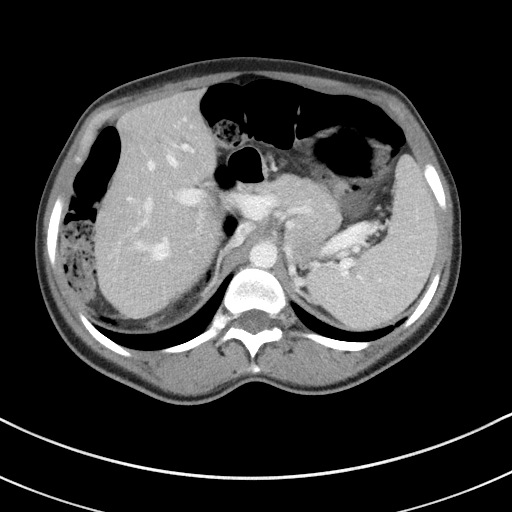
[im 80/92  soft-tissue]
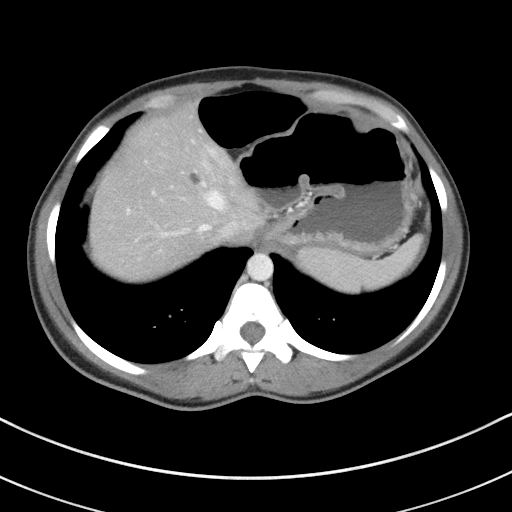
[im 88/92  soft-tissue]
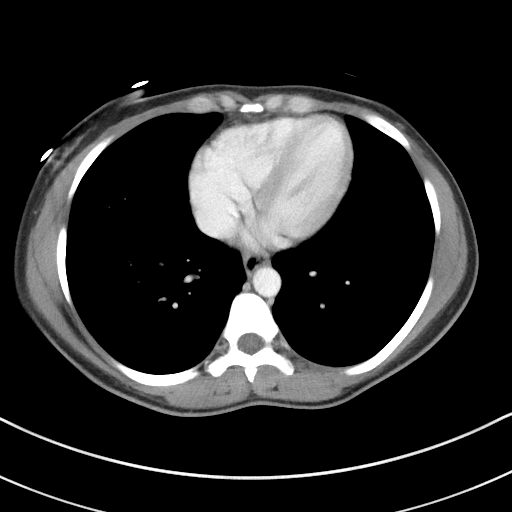

[Series 5: coronal st · coronal · 0.68mm/px · 3 of 89 slices shown]
[im 30/89  soft-tissue]
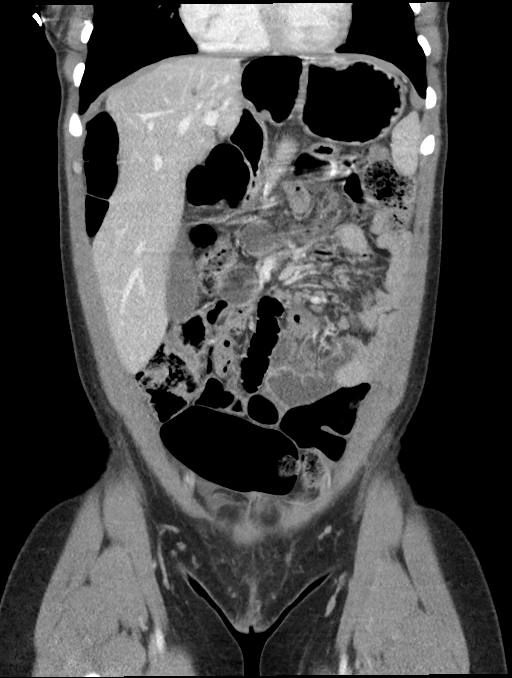
[im 40/89  soft-tissue]
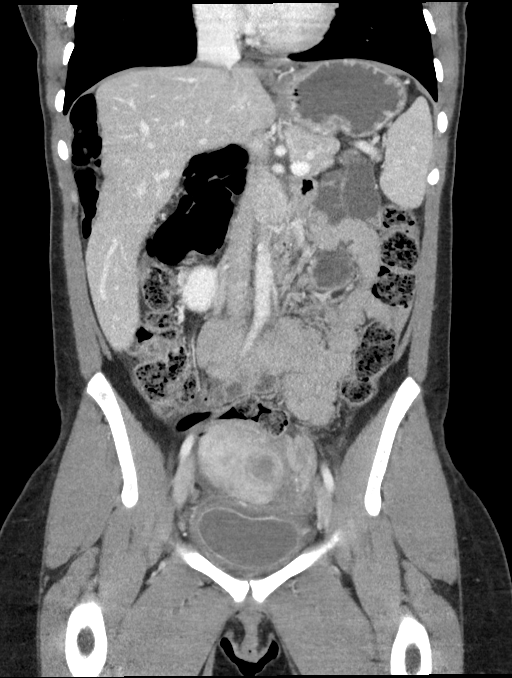
[im 49/89  soft-tissue]
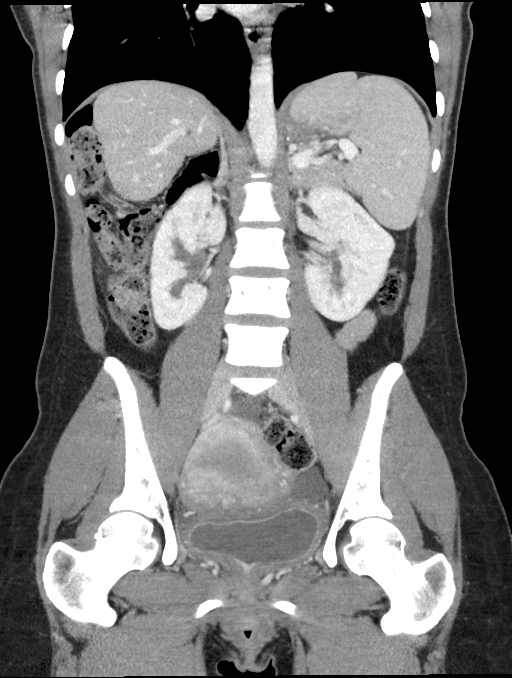

[15 of 46 positions shown; findings below may reference images not displayed]

FINDINGS: Lower chest: No acute pleural or parenchymal lung disease.

Hepatobiliary: No focal liver abnormality is seen. No gallstones,
gallbladder wall thickening, or biliary dilatation.

Pancreas: Unremarkable. No pancreatic ductal dilatation or
surrounding inflammatory changes.

Spleen: Normal in size without focal abnormality.

Adrenals/Urinary Tract: The kidneys enhance normally and
symmetrically. No urinary tract calculi or obstructive uropathy.

There is diffuse bladder wall thickening with perivesicular fat
stranding compatible with cystitis.

The adrenals are unremarkable.

Stomach/Bowel: No bowel obstruction or ileus. Moderate retained
stool throughout the colon. Normal appendix right lower quadrant. No
bowel wall thickening or inflammatory change.

Vascular/Lymphatic: No significant vascular findings are present. No
enlarged abdominal or pelvic lymph nodes.

Reproductive: The uterus is enlarged and heterogeneous, with
multiple hypodense masses consistent with uterine fibroids.

There is a tubular fluid-filled structure within the right adnexa,
measuring approximately 2.2 x 2.5 x 7.1 cm in size. This is
concerning for pelvic inflammatory disease, with right-sided
hydrosalpinx or pyosalpinx. No other adnexal masses.

Other: There is a small amount of free fluid within the pelvis. No
free intraperitoneal gas. Small fat containing umbilical hernia. No
bowel herniation.

Musculoskeletal: No acute or destructive bony lesions. Reconstructed
images demonstrate no additional findings.
IMPRESSION: 1. Tubular fluid-filled structure within the right lower pelvis,
concerning for pelvic inflammatory disease and
pyosalpinx/hydrosalpinx. Pelvic ultrasound may be useful for further
evaluation.
2. Diffuse bladder wall thickening consistent with cystitis.
3. Trace pelvic free fluid.
4. Fibroid uterus.

## 2020-03-20 MED ORDER — CEFTRIAXONE SODIUM 1 G IJ SOLR
1.0000 g | Freq: Once | INTRAMUSCULAR | Status: AC
  Administered 2020-03-20: 1 g via INTRAMUSCULAR
  Filled 2020-03-20: qty 10

## 2020-03-20 MED ORDER — IOHEXOL 300 MG/ML  SOLN
100.0000 mL | Freq: Once | INTRAMUSCULAR | Status: AC | PRN
  Administered 2020-03-20: 100 mL via INTRAVENOUS
  Filled 2020-03-20: qty 100

## 2020-03-20 MED ORDER — METRONIDAZOLE 500 MG PO TABS: 500.0000 mg | ORAL_TABLET | Freq: Two times a day (BID) | ORAL | 0 refills | Status: AC

## 2020-03-20 MED ORDER — SODIUM CHLORIDE 0.9 % IV BOLUS
1000.0000 mL | Freq: Once | INTRAVENOUS | Status: AC
  Administered 2020-03-20: 1000 mL via INTRAVENOUS

## 2020-03-20 MED ORDER — DOXYCYCLINE HYCLATE 100 MG PO TABS: 100.0000 mg | ORAL_TABLET | Freq: Two times a day (BID) | ORAL | 0 refills | Status: AC

## 2020-03-20 NOTE — ED Triage Notes (Signed)
Pt comes with c/o urinary tingling. Pt states this started yesterday. Pt states she noticed some white substance in her urine and is concerned.  Pt denies any burning or itching.

## 2020-03-20 NOTE — ED Provider Notes (Signed)
-----------------------------------------   1930 on 03/20/2020 -----------------------------------------  Blood pressure 113/66, pulse 62, temperature 99.2 F (37.3 C), temperature source Oral, resp. rate 17, height 5\' 1"  (1.549 m), weight 64.4 kg, last menstrual period 03/20/2020, SpO2 100 %.  Assuming care from [facility] PA-C.  In short, Penny Gomez is a 35 y.o. female with a chief complaint of Urinary Tingling .  Refer to the original H&P for additional details.  The current plan of care is to await imaging.  Patient presented to the emergency department with vague nonspecific, intermittent pelvic discomfort as well as a description of urinary tingling.  Patient had been evaluated by my colleague and had no significant physical exam findings initially.  Given the urinary symptoms, urinalysis was obtained.  Large amount of protein was concerning for nephrotic versus nephrotic syndrome and further labs and imaging was obtained.  Labs are reassuring without evidence of nephritic or nephrotic syndrome.  The initial CT scan revealed a tubular structure that was concerning for hydrosalpinx versus pyosalpinx.  Radiology recommended follow-up with ultrasound which was pending on time of handoff.  Current plan was to await imaging and determine action based off of these findings.  I talked with the on-call OB/GYN, Dr. Glennon Mac.  There is concern of hydrosalpinx versus pyosalpinx versus Gartner's cyst versus vaginal abscess.  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  At this time no high concern for infectious process.  At OB/GYN's recommendation, we will treat for PID.  Patient will have Rocephin here,  Flagyl and doxycycline at home.  Patient is given strict return precautions for fevers, chills, pelvic pain or other significant urinary or vaginal changes.  Patient will follow up with OB/GYN and likely be referred to urogynecologist.  At this time, patient is stable for discharge.  She is given follow-up instructions with OB/GYN.    ED diagnosis:  Gartner cyst  ED medications at discharge: Doxycycline Metronidazole    Darletta Moll, PA-C 03/20/20 2320    Duffy Bruce, MD 03/22/20 [id number]

## 2020-03-20 NOTE — ED Notes (Signed)
See triage note  Presents with urinary discomfort  Describes as tingling after urination  States she did start her period this am

## 2020-03-21 NOTE — ED Provider Notes (Signed)
[facility] Emergency Department Provider Note  ____________________________________________   Event Date/Time   First MD Initiated Contact with Patient 03/20/20 1429     (approximate)  I have reviewed the triage vital signs and the nursing notes.   HISTORY  Chief Complaint Urinary Tingling  HPI Penny Gomez is a 35 y.o. female who presents to the emergency department for evaluation of "urinary tingling".  Patient states this is accompanied by urinary frequency.  She does report noting bright red blood when wiping, was initially unsure if this was vaginal or urinary in origin, however she later determined this to be urinary.  Symptoms began yesterday, and have been unchanged since that time.  She denies any urinary burning, vaginal discharge, abdominal pain, fevers or other systemic symptoms.  She denies any associated back pain.  Patient reports history of bilateral tubal ligation approximately 8 years ago after the birth of a child.  She does report remote history of treatment for STD, most recently of which was treatment for trichomoniasis several years ago.  She states no concern for current STD risk.        History reviewed. No pertinent past medical history.  There are no problems to display for this patient.   Past Surgical History:  Procedure Laterality Date  . BREAST BIOPSY Right 05/20/2019   Affirm Bx- X- clip, path pending    Prior to Admission medications   Medication Sig Start Date End Date Taking? Authorizing Provider  doxycycline (VIBRA-TABS) 100 MG tablet Take 1 tablet (100 mg total) by mouth 2 (two) times daily for 14 days. 03/20/20 04/03/20 Yes Cuthriell, Charline Bills, PA-C  metroNIDAZOLE (FLAGYL) 500 MG tablet Take 1 tablet (500 mg total) by mouth 2 (two) times daily for 14 days. 03/20/20 04/03/20 Yes Cuthriell, Charline Bills, PA-C    Allergies Patient has no known allergies.  Family History  Problem Relation Age of Onset  . Breast  cancer Mother 61  . Breast cancer Maternal Aunt 33  . Breast cancer Cousin 27    Social History Social History   Tobacco Use  . Smoking status: Never Smoker  . Smokeless tobacco: Never Used  Substance Use Topics  . Alcohol use: No  . Drug use: No    Review of Systems Constitutional: No fever/chills Eyes: No visual changes. ENT: No sore throat. Cardiovascular: Denies chest pain. Respiratory: Denies shortness of breath. Gastrointestinal: No abdominal pain.  No nausea, no vomiting.  No diarrhea.  No constipation. Genitourinary: + Urinary discomfort, urinary frequency Musculoskeletal: Negative for back pain. Skin: Negative for rash. Neurological: Negative for headaches, focal weakness or numbness.   ____________________________________________   PHYSICAL EXAM:  VITAL SIGNS: ED Triage Vitals  Enc Vitals Group     BP 03/20/20 1210 135/87     Pulse Rate 03/20/20 1210 83     Resp 03/20/20 1212 18     Temp 03/20/20 1210 99 F (37.2 C)     Temp Source 03/20/20 1210 Oral     SpO2 03/20/20 1210 100 %     Weight 03/20/20 1323 141 lb 15.6 oz (64.4 kg)     Height 03/20/20 1323 5\' 1"  (1.549 m)     Head Circumference --      Peak Flow --      Pain Score 03/20/20 1210 0     Pain Loc --      Pain Edu? --      Excl. in [place]? --     Constitutional: Alert  and oriented. Well appearing and in no acute distress. Eyes: Conjunctivae are normal. PERRL. EOMI. Head: Atraumatic. Nose: No congestion/rhinnorhea. Mouth/Throat: Mucous membranes are moist.   Neck: No stridor.   Cardiovascular: Normal rate, regular rhythm. Grossly normal heart sounds.  Good peripheral circulation. Respiratory: Normal respiratory effort.  No retractions. Lungs CTAB. Gastrointestinal: Soft and nontender. No distention. No abdominal bruits. No CVA tenderness. Musculoskeletal: No lower extremity tenderness nor edema.  No joint effusions. Neurologic:  Normal speech and language. No gross focal neurologic deficits  are appreciated. No gait instability. Skin:  Skin is warm, dry and intact. No rash noted. Psychiatric: Mood and affect are normal. Speech and behavior are normal.  ____________________________________________   LABS (all labs ordered are listed, but only abnormal results are displayed)  Labs Reviewed  WET PREP, GENITAL - Abnormal; Notable for the following components:      Result Value   WBC, Wet Prep HPF POC MODERATE (*)    All other components within normal limits  URINALYSIS, COMPLETE (UACMP) WITH MICROSCOPIC - Abnormal; Notable for the following components:   Color, Urine YELLOW (*)    APPearance CLOUDY (*)    Hgb urine dipstick LARGE (*)    Protein, ur >=300 (*)    Leukocytes,Ua SMALL (*)    RBC / HPF >50 (*)    Bacteria, UA RARE (*)    All other components within normal limits  CBC WITH DIFFERENTIAL/PLATELET - Abnormal; Notable for the following components:   WBC 11.1 (*)    Hemoglobin 11.4 (*)    HCT 35.8 (*)    MCV 79.4 (*)    MCH 25.3 (*)    RDW 15.6 (*)    Neutro Abs 8.3 (*)    All other components within normal limits  COMPREHENSIVE METABOLIC PANEL - Abnormal; Notable for the following components:   CO2 20 (*)    Glucose, Bld 101 (*)    Calcium 8.8 (*)    All other components within normal limits  CHLAMYDIA/NGC RT PCR ([facility] ONLY)  URINE CULTURE  PREGNANCY, URINE   ____________________________________________  RADIOLOGY  Official radiology report(s): CT ABDOMEN PELVIS W CONTRAST  Result Date: 03/20/2020 CLINICAL DATA:  Dysuria, discharge EXAM: CT ABDOMEN AND PELVIS WITH CONTRAST TECHNIQUE: Multidetector CT imaging of the abdomen and pelvis was performed using the standard protocol following bolus administration of intravenous contrast. CONTRAST:  13mL OMNIPAQUE IOHEXOL 300 MG/ML  SOLN COMPARISON:  None. FINDINGS: Lower chest: No acute pleural or parenchymal lung disease. Hepatobiliary: No focal liver abnormality is seen. No gallstones, gallbladder  wall thickening, or biliary dilatation. Pancreas: Unremarkable. No pancreatic ductal dilatation or surrounding inflammatory changes. Spleen: Normal in size without focal abnormality. Adrenals/Urinary Tract: The kidneys enhance normally and symmetrically. No urinary tract calculi or obstructive uropathy. There is diffuse bladder wall thickening with perivesicular fat stranding compatible with cystitis. The adrenals are unremarkable. Stomach/Bowel: No bowel obstruction or ileus. Moderate retained stool throughout the colon. Normal appendix right lower quadrant. No bowel wall thickening or inflammatory change. Vascular/Lymphatic: No significant vascular findings are present. No enlarged abdominal or pelvic lymph nodes. Reproductive: The uterus is enlarged and heterogeneous, with multiple hypodense masses consistent with uterine fibroids. There is a tubular fluid-filled structure within the right adnexa, measuring approximately 2.2 x 2.5 x 7.1 cm in size. This is concerning for pelvic inflammatory disease, with right-sided hydrosalpinx or pyosalpinx. No other adnexal masses. Other: There is a small amount of free fluid within the pelvis. No free intraperitoneal gas. Small fat containing umbilical  hernia. No bowel herniation. Musculoskeletal: No acute or destructive bony lesions. Reconstructed images demonstrate no additional findings. IMPRESSION: 1. Tubular fluid-filled structure within the right lower pelvis, concerning for pelvic inflammatory disease and pyosalpinx/hydrosalpinx. Pelvic ultrasound may be useful for further evaluation. 2. Diffuse bladder wall thickening consistent with cystitis. 3. Trace pelvic free fluid. 4. Fibroid uterus. Electronically Signed   By: Randa Ngo M.D.   On: 03/20/2020 16:51   US PELVIC COMPLETE WITH TRANSVAGINAL  Result Date: 03/20/2020 CLINICAL DATA:  Abscess.  Pelvic pain x1 day.  Pain with urination. EXAM: TRANSABDOMINAL AND TRANSVAGINAL ULTRASOUND OF PELVIS TECHNIQUE: Both  transabdominal and transvaginal ultrasound examinations of the pelvis were performed. Transabdominal technique was performed for global imaging of the pelvis including uterus, ovaries, adnexal regions, and pelvic cul-de-sac. It was necessary to proceed with endovaginal exam following the transabdominal exam to visualize the ovaries. COMPARISON:  CT dated 03/20/2020 FINDINGS: Uterus Measurements: 10.4 x 6 x 6.3 cm = volume: 204 mL. No fibroids or other mass visualized. Endometrium Thickness: 20. Uterine fibroids are noted measuring up to approximately 4.3 cm. Right ovary Measurements: 2.2 x 2 x 2.6 cm = volume: 6 mL. Normal appearance/no adnexal mass. Left ovary Measurements: 3.4 x 1.6 x 3 cm = volume: 10 mL. Normal appearance/no adnexal mass. Other findings Adjacent to the right vaginal vault is a tubular structure measuring 8.2 x 1.3 x 1.7 cm. This structure appears to be fluid-filled with internal debris. Incidentally noted is wall thickening of the urinary bladder. There is a small volume of pelvic free fluid. IMPRESSION: 1. Again identified is a fluid-filled tubular structure in the patient's right hemipelvis. This structure is located inferiorly within the right hemipelvis adjacent to the vaginal vault. While this may represent a tubo-ovarian abscess in the appropriate clinical setting, it appears to be somewhat low in location. 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 2. Fibroid uterus. 3. Incidentally noted wall thickening of the urinary bladder. Findings are concerning for cystitis in the appropriate clinical setting. Correlation with urinalysis is recommended. 4. Small volume pelvic free fluid likely reactive or physiologic. Electronically Signed   By: Constance Holster M.D.   On: 03/20/2020 20:17    ____________________________________________   INITIAL IMPRESSION / ASSESSMENT AND PLAN / ED COURSE  As part of my medical decision making, I reviewed the following data within the [place] notes reviewed and incorporated, Labs reviewed, Patient signed out to [employer], PA-C, A consult was requested and obtained from this/these consultant(s) OB/GYN and Notes from prior ED visits        Patient is a 35 year old female who reports to the emergency department for evaluation of "urinary tingling" associated with hematuria and urinary frequency.  She denies any abdominal pain, vaginal discharge, fever or other systemic symptoms.  See HPI for further details.  In triage, the patient has a temperature of 99.2, otherwise has normal vital signs.  On physical exam, the patient has no acute findings, no tenderness to palpation of the abdomen, no CVA tenderness, or other.  Initial work-up began with urinalysis which revealed a large amount of hemoglobin, greater than 300 proteins, small number of leukocytes, greater than 50 RBCs as well as rare bacteria.  This urine will be sent for culture.  Given the amount of proteinuria in her urine without any history of renal disease, laboratory evaluation was begun with CBC, CMP.  CBC does reveal a mild leukocytosis of 11.1 as well as a mild anemia  of 11.4, otherwise is grossly unremarkable.  CMP is grossly unremarkable with BUN of 13 and creatinine of 0.64.  Given the absence of abnormal renal labs and strong proteinuria still present in the urine, a CT of the abdomen pelvis with contrast was performed.  This revealed a tubular, fluid-filled structure in the right lower pelvis concerning for possible PID versus pyosalpinx versus hydrosalpinx.  There was also the presence of some diffuse bladder wall thickening.  Given these findings, a pelvic ultrasound was recommended for further evaluation, which was performed.  In the interim given the concern for PID, a wet prep as well as GC and chlamydia swab were obtained.  The patient's GC and Chlamydia are negative, and wet prep demonstrated white cells without any clue cells, trichomoniasis  or other abnormal findings.  The pelvic ultrasound is still pending at this time, the patient will be handed off to Betha Loa, PA-C with current plan to await imaging and discuss with OB/GYN.      ____________________________________________   FINAL CLINICAL IMPRESSION(S) / ED DIAGNOSES  Final diagnoses:  Gartner's duct cyst     ED Discharge Orders         Ordered    doxycycline (VIBRA-TABS) 100 MG tablet  2 times daily        03/20/20 2317    metroNIDAZOLE (FLAGYL) 500 MG tablet  2 times daily        03/20/20 2317          *Please note:  Penny Gomez was evaluated in Emergency Department on 03/21/2020 for the symptoms described in the history of present illness. She was evaluated in the context of the global COVID-19 pandemic, which necessitated consideration that the patient might be at risk for infection with the SARS-CoV-2 virus that causes COVID-19. Institutional protocols and algorithms that pertain to the evaluation of patients at risk for COVID-19 are in a state of rapid change based on information released by regulatory bodies including the CDC and federal and state organizations. These policies and algorithms were followed during the patient's care in the ED.  Some ED evaluations and interventions may be delayed as a result of limited staffing during and the pandemic.*   Note:  This document was prepared using Dragon voice recognition software and may include unintentional dictation errors.   Marlana Salvage, PA 03/21/20 1630    Carrie Mew, MD 03/21/20 2330

## 2020-03-23 LAB — URINE CULTURE: Culture: >=100000 — AB

## 2020-03-24 NOTE — Progress Notes (Signed)
ED Antimicrobial Stewardship Positive Culture Follow Up   Penny Gomez is an 35 y.o. female who presented to [facility] on 03/20/2020 with a chief complaint of  Chief Complaint  Patient presents with  . Urinary Tingling    Recent Results (from the past 720 hour(s))  Chlamydia/NGC rt PCR ([facility] only)     Status: None   Collection Time: 03/20/20  5:50 PM   Specimen: Cervical/Vaginal swab  Result Value Ref Range Status   Specimen source GC/Chlam ENDOCERVICAL  Final   Chlamydia Tr NOT DETECTED NOT DETECTED Final   N gonorrhoeae NOT DETECTED NOT DETECTED Final    Comment: (NOTE) This CT/NG assay has not been evaluated in patients with a history of  hysterectomy. Performed at [facility], [address] [place], [place] [address]   Wet prep, genital     Status: Abnormal   Collection Time: 03/20/20  5:50 PM   Specimen: Cervical/Vaginal swab  Result Value Ref Range Status   Yeast Wet Prep HPF POC NONE SEEN NONE SEEN Final   Trich, Wet Prep NONE SEEN NONE SEEN Final   Clue Cells Wet Prep HPF POC NONE SEEN NONE SEEN Final   WBC, Wet Prep HPF POC MODERATE (A) NONE SEEN Final   Sperm NONE SEEN  Final    Comment: Performed at [facility], [address] [place], [place] [address]  Urine culture     Status: Abnormal   Collection Time: 03/20/20  5:53 PM   Specimen: Urine, Random  Result Value Ref Range Status   Specimen Description   Final    URINE, RANDOM Performed at [facility], [address] [place], [place] [address]    Special Requests   Final    NONE Performed at [facility], [address] [place], [place] [address]    Culture >=100,000 COLONIES/mL ESCHERICHIA COLI (A)  Final   Report Status 03/23/2020 FINAL  Final   Organism ID, Bacteria ESCHERICHIA COLI (A)  Final      Susceptibility   Escherichia coli - MIC*    AMPICILLIN 4 SENSITIVE Sensitive     CEFAZOLIN <=4 SENSITIVE Sensitive     CEFEPIME <=0.12 SENSITIVE Sensitive      CEFTRIAXONE <=0.25 SENSITIVE Sensitive     CIPROFLOXACIN <=0.25 SENSITIVE Sensitive     GENTAMICIN <=1 SENSITIVE Sensitive     IMIPENEM <=0.25 SENSITIVE Sensitive     NITROFURANTOIN <=16 SENSITIVE Sensitive     TRIMETH/SULFA <=20 SENSITIVE Sensitive     AMPICILLIN/SULBACTAM <=2 SENSITIVE Sensitive     PIP/TAZO <=4 SENSITIVE Sensitive     * >=100,000 COLONIES/mL ESCHERICHIA COLI    35 yo F presenting with urinary tingling. Pt thought to have Gartner cyst, treated for PID with doxycycline 100 mg BID and metronidazole 500 mg BID. Pt reports that urinary symptoms are improving, will not send in different antibiotics at this time.   New antibiotic prescription: None  ED Provider: Sandrea Hughs, PharmD Pharmacy Resident  03/24/2020 2:48 PM

## 2020-03-31 ENCOUNTER — Other Ambulatory Visit: Payer: Self-pay

## 2020-03-31 ENCOUNTER — Ambulatory Visit (INDEPENDENT_AMBULATORY_CARE_PROVIDER_SITE_OTHER): Payer: Self-pay | Admitting: Obstetrics and Gynecology

## 2020-03-31 ENCOUNTER — Encounter: Payer: Self-pay | Admitting: Obstetrics and Gynecology

## 2020-03-31 ENCOUNTER — Other Ambulatory Visit (HOSPITAL_COMMUNITY)
Admission: RE | Admit: 2020-03-31 | Discharge: 2020-03-31 | Disposition: A | Discharge status: Home / Self Care | Payer: Self-pay | Source: Ambulatory Visit | Attending: Obstetrics and Gynecology | Admitting: Obstetrics and Gynecology

## 2020-03-31 VITALS — BP 124/74 | Ht 60.0 in | Wt 139.0 lb

## 2020-03-31 DIAGNOSIS — Z124 Encounter for screening for malignant neoplasm of cervix: Secondary | ICD-10-CM | POA: Insufficient documentation

## 2020-03-31 DIAGNOSIS — R160 Hepatomegaly, not elsewhere classified: Secondary | ICD-10-CM

## 2020-03-31 DIAGNOSIS — Q524 Other congenital malformations of vagina: Secondary | ICD-10-CM

## 2020-03-31 NOTE — Progress Notes (Signed)
Obstetrics & Gynecology Office Visit   Chief Complaint  Patient presents with  . Follow-up  ER follow up from 03/20/2020  History of Present Illness: 35 y.o. [id number] female who presents in follow up from an ER visit on 03/20/2020.  She went to the ER she had a pain when voiding and she noted white "pebbles" and blood in her urine.  She was diagnosed with UTI and she was treated.  She feels better from this standpoint.   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  She had negative STD screening in the hospital.    Past Medical History:  Diagnosis Date  . Breast lump     Past Surgical History:  Procedure Laterality Date  . BREAST BIOPSY Right 05/20/2019   Affirm Bx- X- clip, path pending  . CESAREAN SECTION    . TUBAL LIGATION      Gynecologic History: Patient's last menstrual period was 03/20/2020.  Obstetric History: [id number]  Family History  Problem Relation Age of Onset  . Breast cancer Mother 42  . Breast cancer Maternal Aunt 33  . Breast cancer Cousin 10    Social History   Socioeconomic History  . Marital status: Single    Spouse name: Not on file  . Number of children: Not on file  . Years of education: Not on file  . Highest education level: Not on file  Occupational History  . Not on file  Tobacco Use  . Smoking status: Never Smoker  . Smokeless tobacco: Never Used  Vaping Use  . Vaping Use: Never used  Substance and Sexual Activity  . Alcohol use: Yes  . Drug use: No  . Sexual activity: Yes    Birth control/protection: None  Other Topics Concern  . Not on file  Social History Narrative  . Not on file   Social Determinants of Health   Financial Resource Strain: Not on file  Food Insecurity: Not on file  Transportation Needs: Not on file  Physical Activity: Not on file  Stress: Not on file  Social Connections: Not on file  Intimate Partner Violence: Not on file    No Known  Allergies  Prior to Admission medications   Medication Sig Start Date End Date Taking? Authorizing Provider  doxycycline (VIBRA-TABS) 100 MG tablet Take 1 tablet (100 mg total) by mouth 2 (two) times daily for 14 days. 03/20/20 04/03/20 Yes Cuthriell, Charline Bills, PA-C  metroNIDAZOLE (FLAGYL) 500 MG tablet Take 1 tablet (500 mg total) by mouth 2 (two) times daily for 14 days. 03/20/20 04/03/20 Yes Cuthriell, Charline Bills, PA-C    Review of Systems  Constitutional: Negative.   HENT: Negative.   Eyes: Negative.   Respiratory: Negative.   Cardiovascular: Negative.   Gastrointestinal: Negative.   Genitourinary: Negative.   Musculoskeletal: Negative.   Skin: Negative.   Neurological: Negative.   Psychiatric/Behavioral: Negative.      Physical Exam BP 124/74   Ht 5' (1.524 m)   Wt 139 lb (63 kg)   LMP 03/20/2020 Comment: preg waiver  BMI 27.15 kg/m  Patient's last menstrual period was 03/20/2020. Physical Exam Constitutional:      General: She is not in acute distress.    Appearance: Normal appearance. She is well-developed.  Genitourinary:     Vulva, bladder and urethral meatus normal.     There are lesions in the vagina.     Right Labia: No rash, tenderness, lesions, skin  changes or Bartholin's cyst.    Left Labia: No tenderness, skin changes, Bartholin's cyst or rash.    No inguinal adenopathy present in the right or left side.    Pelvic Tanner Score: 5/5.    No vaginal erythema, tenderness, bleeding or ulceration.     Vaginal exam comments: Right wall cyst noted extended from lateral to the cervix distally by about 4-5 cm, about 2 cm in width..      Right Adnexa: not tender, not full and no mass present.    Left Adnexa: not tender, not full and no mass present.    No cervical motion tenderness, friability, lesion or polyp.     Uterus is enlarged and irregular.     Uterus is not fixed or tender.     Uterine mass present.    Uterus is anteverted.     No urethral tenderness or  mass present.     Pelvic exam was performed with patient in the lithotomy position.  HENT:     Head: Normocephalic and atraumatic.  Eyes:     General: No scleral icterus.    Conjunctiva/sclera: Conjunctivae normal.  Cardiovascular:     Rate and Rhythm: Normal rate and regular rhythm.     Heart sounds: No murmur heard. No friction rub. No gallop.   Pulmonary:     Effort: Pulmonary effort is normal. No respiratory distress.     Breath sounds: Normal breath sounds. No wheezing or rales.  Abdominal:     General: Bowel sounds are normal. There is no distension.     Palpations: Abdomen is soft. There is hepatomegaly. There is no mass.     Tenderness: There is no abdominal tenderness. There is no guarding or rebound.     Hernia: There is no hernia in the left inguinal area or right inguinal area.    Musculoskeletal:        General: Normal range of motion.     Cervical back: Normal range of motion and neck supple.  Lymphadenopathy:     Lower Body: No right inguinal adenopathy. No left inguinal adenopathy.  Neurological:     General: No focal deficit present.     Mental Status: She is alert and oriented to person, place, and time.     Cranial Nerves: No cranial nerve deficit.  Skin:    General: Skin is warm and dry.     Findings: No erythema.  Psychiatric:        Mood and Affect: Mood normal.        Behavior: Behavior normal.        Judgment: Judgment normal.    Female chaperone present for pelvic and breast  portions of the physical exam  U/S image showing tubular structure lateral to cervix, under bladder, lateral to vagina.   Assessment: 35 y.o. G12P2002 female here for  1. Gartner duct, cyst   2. Pap smear for cervical cancer screening      Plan: Problem List Items Addressed This Visit   None   Visit Diagnoses    Gartner duct, cyst    -  Primary   Relevant Orders   Ambulatory referral to Urogynecology   Pap smear for cervical cancer screening       Relevant Orders    Cytology - PAP     Will refer to urogyn for treatment.  A total of 35 minutes were spent face-to-face with the patient as well as preparation, review of images, communication, and documentation during this  encounter.    Hepatomegaly, confirmed on imaging, of uncertain significance.  Will consider after referral to Urogyn.  Prentice Docker, MD 03/31/2020 2:20 PM

## 2020-04-05 LAB — CYTOLOGY - PAP
Comment: NEGATIVE
Diagnosis: NEGATIVE
High risk HPV: NEGATIVE

## 2020-05-08 NOTE — Progress Notes (Deleted)
[facility] New Patient Evaluation and Consultation  Referring Provider: Will Bonnet, MD PCP: [place] [facility] Date of Service: 05/10/2020  SUBJECTIVE Chief Complaint: No chief complaint on file.  History of Present Illness: Penny Gomez is a 35 y.o. White or Caucasian female seen in consultation at the request of Dr. Glennon Mac for evaluation of gartner's duct cyst.    Review of records in Epic/ from Dr Glennon Mac significant for: Martin Majestic to ER on 03/20/20 for urinary frequency and hematuria. Was noted to have significant proteinuria and so had CT abdomen and pelvis. Was found to have a tubular fluid filled structure in the right lower pelvis. She was treated for possible PID (doxycycline and flagyl). Urine culture returned with >100,000 E.Coli. Imaging was performed below.   CT A/P 03/20/20 IMPRESSION: 1. Tubular fluid-filled structure within the right lower pelvis, concerning for pelvic inflammatory disease and pyosalpinx/hydrosalpinx. Pelvic ultrasound may be useful for further evaluation. 2. Diffuse bladder wall thickening consistent with cystitis. 3. Trace pelvic free fluid. 4. Fibroid uterus.  US Pelvis 03/20/20: IMPRESSION: 1. Again identified is a fluid-filled tubular structure in the patient's right hemipelvis. This structure is located inferiorly within the right hemipelvis adjacent to the vaginal vault. While this may represent a tubo-ovarian abscess in the appropriate clinical setting, it appears to be somewhat low in location. 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 2. Fibroid uterus. 3. Incidentally noted wall thickening of the urinary bladder. Findings are concerning for cystitis in the appropriate clinical setting. Correlation with urinalysis is recommended. 4. Small volume pelvic free fluid likely reactive or physiologic.  She saw Dr Glennon Mac on 03/31/20 and was noted to have a right wall cyst extending lateral from the  cervix.   Urinary Symptoms: {urine leakage?:24754} Leaks *** time(s) per {days/wks/mos/yrs:310907}.  Pad use: {NUMBERS 1-10:18281} {pad option:24752} per day.   She {ACTION; IS/IS YOV:78588502} bothered by her UI symptoms.  Day time voids ***.  Nocturia: *** times per night to void. Voiding dysfunction: she {empties:24755} her bladder well.  {DOES NOT does:27190::"does not"} use a catheter to empty bladder.  When urinating, she feels {urine symptoms:24756} Drinks: *** per day  UTIs: {NUMBERS 1-10:18281} UTI's in the last year.   {ACTIONS;DENIES/REPORTS:21021675::"Denies"} history of {urologic concerns:24757}  Pelvic Organ Prolapse Symptoms:                  She {denies/ admits to:24761} a feeling of a bulge the vaginal area. It has been present for {NUMBER 1-10:22536} {days/wks/mos/yrs:310907}.  She {denies/ admits to:24761} seeing a bulge.  This bulge {ACTION; IS/IS DXA:12878676} bothersome.  Bowel Symptom: Bowel movements: *** time(s) per {Time; day/week/month:13537} Stool consistency: {stool consistency:24758} Straining: {yes/no:19897}.  Splinting: {yes/no:19897}.  Incomplete evacuation: {yes/no:19897}.  She {denies/ admits to:24761} accidental bowel leakage / fecal incontinence  Occurs: *** time(s) per {Time; day/week/month:13537}  Consistency with leakage: {stool consistency:24758} Bowel regimen: {bowel regimen:24759} Last colonoscopy: Date ***, Results ***  Sexual Function Sexually active: {yes/no:19897}.  Sexual orientation: {Sexual Orientation:909 296 5126} Pain with sex: {pain with sex:24762}  Pelvic Pain {denies/ admits to:24761} pelvic pain Location: *** Pain occurs: *** Prior pain treatment: *** Improved by: *** Worsened by: ***   Past Medical History:  Past Medical History:  Diagnosis Date  . Breast lump      Past Surgical History:   Past Surgical History:  Procedure Laterality Date  . BREAST BIOPSY Right 05/20/2019   Affirm Bx- X- clip, path  pending  . CESAREAN SECTION    .  TUBAL LIGATION       Past OB/GYN History: G{NUMBERS 1-10:18281} P{NUMBERS 1-10:18281} Vaginal deliveries: ***,  Forceps/ Vacuum deliveries: ***, Cesarean section: *** Menopausal: {menopausal:24763} Contraception: ***. Last pap smear was ***.  Any history of abnormal pap smears: {yes/no:19897}.   Medications: She currently has no medications in their medication list.   Allergies: Patient has No Known Allergies.   Social History:  Social History   Tobacco Use  . Smoking status: Never Smoker  . Smokeless tobacco: Never Used  Vaping Use  . Vaping Use: Never used  Substance Use Topics  . Alcohol use: Yes  . Drug use: No    Relationship status: {relationship status:24764} She lives with ***.   She {ACTION; IS/IS TDD:22025427} employed ***. Regular exercise: {Yes/No:304960894} History of abuse: {Yes/No:304960894}  Family History:   Family History  Problem Relation Age of Onset  . Breast cancer Mother 45  . Breast cancer Maternal Aunt 33  . Breast cancer Cousin 33     Review of Systems: ROS   OBJECTIVE Physical Exam: There were no vitals filed for this visit.  Physical Exam   GU / Detailed Urogynecologic Evaluation:  Pelvic Exam: Normal external female genitalia; Bartholin's and Skene's glands normal in appearance; urethral meatus normal in appearance, no urethral masses or discharge.   CST: {gen negative/positive:315881}  Reflexes: bulbocavernosis {DESC; PRESENT/NOT PRESENT:21021351}, anocutaneous {DESC; PRESENT/NOT PRESENT:21021351} ***bilaterally.  Speculum exam reveals normal vaginal mucosa {With/Without:20273} atrophy. Cervix {exam; gyn cervix:30847}. Uterus {exam; pelvic uterus:30849}. Adnexa {exam; adnexa:12223}.    s/p hysterectomy: Speculum exam reveals normal vaginal mucosa {With/Without:20273}  atrophy and normal vaginal cuff.  Adnexa {exam; adnexa:12223}.    With apex supported, anterior compartment defect was  {reduced:24765}  Pelvic floor strength {Roman # I-V:19040}/V, puborectalis {Roman # I-V:19040}/V external anal sphincter {Roman # I-V:19040}/V  Pelvic floor musculature: Right levator {Tender/Non-tender:20250}, Right obturator {Tender/Non-tender:20250}, Left levator {Tender/Non-tender:20250}, Left obturator {Tender/Non-tender:20250}  POP-Q:   POP-Q                                               Aa                                               Ba                                                 C                                                Gh                                               Pb  tvl                                                Ap                                               Bp                                                 D     Rectal Exam:  Normal sphincter tone, {rectocele:24766} distal rectocele, enterocoele {DESC; PRESENT/NOT PRESENT:21021351}, no rectal masses, {sign of:24767} dyssynergia when asking the patient to bear down.  Post-Void Residual (PVR) by Bladder Scan: In order to evaluate bladder emptying, we discussed obtaining a postvoid residual and she agreed to this procedure.  Procedure: The ultrasound unit was placed on the patient's abdomen in the suprapubic region after the patient had voided. A PVR of *** ml was obtained by bladder scan.  Laboratory Results: @ENCLABS @   ***I visualized the urine specimen, noting the specimen to be {urine color:24768}  ASSESSMENT AND PLAN Penny Gomez is a 35 y.o. with: No diagnosis found.    Jaquita Folds, MD   Medical Decision Making:  - Reviewed/ ordered a clinical laboratory test - Reviewed/ ordered a radiologic study - Reviewed/ ordered medicine test - Decision to obtain old records - Discussion of management of or test interpretation with an external physician / other healthcare professional  - Assessment requiring independent  historian - Review and summation of prior records - Independent review of image, tracing or specimen

## 2020-05-10 ENCOUNTER — Ambulatory Visit: Payer: Self-pay | Admitting: Obstetrics and Gynecology

## 2020-09-07 NOTE — Progress Notes (Signed)
[facility] New Patient Evaluation and Consultation  Referring Provider: Will Bonnet, MD PCP: [facility] Date of Service: 09/12/2020  SUBJECTIVE Chief Complaint: New Patient (Initial Visit)- cyst  History of Present Illness: Penny Gomez is a 35 y.o. Black or African-American female seen in consultation at the request of Dr. Glennon Mac for evaluation of gartner's duct cyst.    Review of records from Dr Glennon Mac and in [place] significant for: Presented to ED for UTI symptoms and was noted to have a vaginal cyst on ultrasound, thought to be a gartner's duct cyst.  U/S image 03/20/20 showing tubular structure lateral to cervix, under bladder, lateral to vagina.   IMPRESSION: 1. Again identified is a fluid-filled tubular structure in the patient's right hemipelvis. This structure is located inferiorly within the right hemipelvis adjacent to the vaginal vault. While this may represent a tubo-ovarian abscess in the appropriate clinical setting, it appears to be somewhat low in location. 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 2. Fibroid uterus. 3. Incidentally noted wall thickening of the urinary bladder. Findings are concerning for cystitis in the appropriate clinical setting. Correlation with urinalysis is recommended. 4. Small volume pelvic free fluid likely reactive or physiologic.   CT A/P on 03/20/20 noted:  IMPRESSION: 1. Tubular fluid-filled structure within the right lower pelvis, concerning for pelvic inflammatory disease and pyosalpinx/hydrosalpinx. Pelvic ultrasound may be useful for further evaluation. 2. Diffuse bladder wall thickening consistent with cystitis. 3. Trace pelvic free fluid. 4. Fibroid uterus.  Urinary Symptoms: Does not leak urine.   Day time voids- normal.  Nocturia: 1 times per night to void. Voiding dysfunction: she empties her bladder well.  does not use a catheter to empty bladder.  When  urinating, she feels she has no difficulties   UTIs: 1 UTI's in the last year.   Denies history of blood in urine and kidney or bladder stones  Pelvic Organ Prolapse Symptoms:                  She Denies a feeling of a bulge the vaginal area.   Bowel Symptom: Bowel movements: 1 time(s) per week Stool consistency: hard Straining: yes, sometimes Splinting: no.  Incomplete evacuation: no.  She Denies accidental bowel leakage / fecal incontinence   Sexual Function Sexually active: yes.  Sexual orientation: Straight Pain with sex: No  Pelvic Pain Denies pelvic pain  Had gone to ED in Feb for pelvic pain and urinary symptoms. Symptoms more on the right side. She was treated with antibiotics. Imaging suggested TOA vs Gartner's duct cyst. Has not had symptoms since that time. Denies vaginal or pelvic pain.   Past Medical History:  Past Medical History:  Diagnosis Date   Breast lump      Past Surgical History:   Past Surgical History:  Procedure Laterality Date   BREAST BIOPSY Right 05/20/2019   Affirm Bx- X- clip, path pending   CESAREAN SECTION     TUBAL LIGATION       Past OB/GYN History: OB History  Gravida Para Term Preterm AB Living  '2 2 2     2  '$ SAB IAB Ectopic Multiple Live Births          2    # Outcome Date GA Lbr Len/2nd Weight Sex Delivery Anes PTL Lv  2 Term           1 Term  Menopausal: No,  Patient's last menstrual period was 08/26/2020.   Medications: She has a current medication list which includes the following prescription(s): fluconazole.   Allergies: Patient has No Known Allergies.   Social History:  Social History   Tobacco Use   Smoking status: Never   Smokeless tobacco: Never  Vaping Use   Vaping Use: Never used  Substance Use Topics   Alcohol use: Not Currently   Drug use: No    Relationship status: single She lives with mom and 2 kids.   She is employed   Family History:   Family History  Problem Relation  Age of Onset   Breast cancer Mother 66   Breast cancer Maternal Aunt 33   Breast cancer Cousin 33     Review of Systems: Review of Systems  Constitutional:  Negative for fever, malaise/fatigue and weight loss.  Respiratory:  Negative for cough, shortness of breath and wheezing.   Cardiovascular:  Negative for chest pain, palpitations and leg swelling.  Gastrointestinal:  Negative for abdominal pain and blood in stool.  Genitourinary:  Negative for dysuria.  Musculoskeletal:  Negative for myalgias.  Skin:  Negative for rash.  Neurological:  Negative for dizziness and headaches.  Endo/Heme/Allergies:  Does not bruise/bleed easily.  Psychiatric/Behavioral:  Negative for depression. The patient is not nervous/anxious.     OBJECTIVE Physical Exam: Vitals:   09/12/20 0830  BP: 121/82  Pulse: 69  Weight: 133 lb (60.3 kg)  Height: 5' (1.524 m)    Physical Exam Constitutional:      General: She is not in acute distress. Pulmonary:     Effort: Pulmonary effort is normal.  Abdominal:     General: There is no distension.     Palpations: Abdomen is soft.     Tenderness: There is no abdominal tenderness. There is no rebound.  Musculoskeletal:        General: No swelling. Normal range of motion.  Skin:    General: Skin is warm and dry.     Findings: No rash.  Neurological:     Mental Status: She is alert and oriented to person, place, and time.  Psychiatric:        Mood and Affect: Mood normal.        Behavior: Behavior normal.     GU / Detailed Urogynecologic Evaluation:  Pelvic Exam: Normal external female genitalia; Bartholin's and Skene's glands normal in appearance; urethral meatus normal in appearance, no urethral masses or discharge.   CST: negative   Speculum exam reveals normal vaginal mucosa without atrophy. Cervix normal appearance. Uterus normal single, nontender. Adnexa normal adnexa.  On bimanual, no anterior wall masses palpable.   Pelvic floor musculature:  Right levator non-tender, Right obturator non-tender, Left levator non-tender, Left obturator non-tender  POP-Q:  deferred  Rectal Exam:  Normal external rectum  Post-Void Residual (PVR) by Bladder Scan: In order to evaluate bladder emptying, we discussed obtaining a postvoid residual and she agreed to this procedure.  Procedure: The ultrasound unit was placed on the patient's abdomen in the suprapubic region after the patient had voided. A PVR of 35 ml was obtained by bladder scan.  Laboratory Results: POC urine: negative  I visualized the urine specimen, noting the specimen to be clear yellow  ASSESSMENT AND PLAN Penny Gomez is a 35 y.o. with:  1. Pelvic mass   2. Urinary frequency    - Unclear diagnosis of Gartner duct cyst vs TOA based on prior imaging of CT A/P  and TVUS. I independently reviewed both imaging and simple cystic structure noted of unclear origin.  - As she is no longer having pain symptoms after taking antibiotics, suspect TOA more than Gartner's duct cyst. However, will repeat TVUS imaging since it has been several months and it may have resolved. If cyst is persistent, and she has no symptoms, then no intervention is needed at this time. If symptoms return, and it is palpable vaginally, then can consider resection. TVUS ordered.   Will call with results of ultrasound.   Jaquita Folds, MD   Medical Decision Making:  - Reviewed/ ordered a clinical laboratory test - Reviewed/ ordered a radiologic study - Review and summation of prior records - Independent review of image, tracing or specimen

## 2020-09-11 ENCOUNTER — Telehealth: Payer: Self-pay

## 2020-09-11 NOTE — Telephone Encounter (Signed)
Penny Gomez is a 35 y.o. female was called and contacted re: New pt Pre appt call to collect history information. Pt verified using 2 identifiers. Confirmation that I am speaking with the correct person.  Chart was updated: -Allergy -Medication -Confirm pharmacy -OB history  Pt was notified to arrive 15 min early and we will need a urine sample when she arrives. Pt verbalized understanding.

## 2020-09-12 ENCOUNTER — Encounter: Payer: Self-pay | Admitting: Obstetrics and Gynecology

## 2020-09-12 ENCOUNTER — Ambulatory Visit (INDEPENDENT_AMBULATORY_CARE_PROVIDER_SITE_OTHER): Payer: BLUE CROSS/BLUE SHIELD | Admitting: Obstetrics and Gynecology

## 2020-09-12 ENCOUNTER — Other Ambulatory Visit: Payer: Self-pay

## 2020-09-12 VITALS — BP 121/82 | HR 69 | Ht 60.0 in | Wt 133.0 lb

## 2020-09-12 DIAGNOSIS — R350 Frequency of micturition: Secondary | ICD-10-CM

## 2020-09-12 DIAGNOSIS — R1900 Intra-abdominal and pelvic swelling, mass and lump, unspecified site: Secondary | ICD-10-CM | POA: Diagnosis not present

## 2020-09-12 LAB — POCT URINALYSIS DIPSTICK
Appearance: NORMAL
Bilirubin, UA: NEGATIVE
Blood, UA: NEGATIVE
Glucose, UA: NEGATIVE
Ketones, UA: NEGATIVE
Leukocytes, UA: NEGATIVE
Nitrite, UA: NEGATIVE
Protein, UA: NEGATIVE
Spec Grav, UA: 1.025 (ref 1.010–1.025)
Urobilinogen, UA: 0.2 E.U./dL
pH, UA: 6.5 (ref 5.0–8.0)

## 2020-09-18 ENCOUNTER — Other Ambulatory Visit: Payer: Self-pay

## 2020-09-18 ENCOUNTER — Ambulatory Visit
Admission: RE | Admit: 2020-09-18 | Discharge: 2020-09-18 | Disposition: A | Discharge status: Home / Self Care | Payer: BLUE CROSS/BLUE SHIELD | Source: Ambulatory Visit | Attending: Obstetrics and Gynecology | Admitting: Obstetrics and Gynecology

## 2020-09-18 DIAGNOSIS — R1900 Intra-abdominal and pelvic swelling, mass and lump, unspecified site: Secondary | ICD-10-CM | POA: Diagnosis present

## 2020-11-19 ENCOUNTER — Emergency Department
Admission: EM | Admit: 2020-11-19 | Discharge: 2020-11-19 | Disposition: A | Discharge status: Home / Self Care | Payer: BLUE CROSS/BLUE SHIELD | Attending: Emergency Medicine | Admitting: Emergency Medicine

## 2020-11-19 ENCOUNTER — Other Ambulatory Visit: Payer: Self-pay

## 2020-11-19 DIAGNOSIS — A549 Gonococcal infection, unspecified: Secondary | ICD-10-CM | POA: Insufficient documentation

## 2020-11-19 LAB — RAPID HIV SCREEN (HIV 1/2 AB+AG)
HIV 1/2 Antibodies: NONREACTIVE
HIV-1 P24 Antigen - HIV24: NONREACTIVE

## 2020-11-19 LAB — COMPREHENSIVE METABOLIC PANEL
ALT: 14 U/L (ref 0–44)
AST: 20 U/L (ref 15–41)
Albumin: 4 g/dL (ref 3.5–5.0)
Alkaline Phosphatase: 64 U/L (ref 38–126)
Anion gap: 7 (ref 5–15)
BUN: 9 mg/dL (ref 6–20)
CO2: 22 mmol/L (ref 22–32)
Calcium: 9 mg/dL (ref 8.9–10.3)
Chloride: 106 mmol/L (ref 98–111)
Creatinine, Ser: 0.72 mg/dL (ref 0.44–1.00)
GFR, Estimated: >60 mL/min (ref >60)
Glucose, Bld: 100 mg/dL — ABNORMAL HIGH (ref 70–99)
Potassium: 3.7 mmol/L (ref 3.5–5.1)
Sodium: 135 mmol/L (ref 135–145)
Total Bilirubin: 0.6 mg/dL (ref 0.3–1.2)
Total Protein: 7.4 g/dL (ref 6.5–8.1)

## 2020-11-19 LAB — CBC
HCT: 34.2 % — ABNORMAL LOW (ref 36.0–46.0)
Hemoglobin: 10.8 g/dL — ABNORMAL LOW (ref 12.0–15.0)
MCH: 23.7 pg — ABNORMAL LOW (ref 26.0–34.0)
MCHC: 31.6 g/dL (ref 30.0–36.0)
MCV: 75 fL — ABNORMAL LOW (ref 80.0–100.0)
Platelets: 273 10*3/uL (ref 150–400)
RBC: 4.56 MIL/uL (ref 3.87–5.11)
RDW: 17.6 % — ABNORMAL HIGH (ref 11.5–15.5)
WBC: 9.9 10*3/uL (ref 4.0–10.5)
nRBC: 0 % (ref 0.0–0.2)

## 2020-11-19 LAB — CHLAMYDIA/NGC RT PCR (ARMC ONLY)
Chlamydia Tr: NOT DETECTED
N gonorrhoeae: DETECTED — AB

## 2020-11-19 LAB — WET PREP, GENITAL
Clue Cells Wet Prep HPF POC: NONE SEEN
Sperm: NONE SEEN
Trich, Wet Prep: NONE SEEN
Yeast Wet Prep HPF POC: NONE SEEN

## 2020-11-19 MED ORDER — HYDROCORTISONE ACETATE 25 MG RE SUPP: 25.0000 mg | Freq: Two times a day (BID) | RECTAL | 1 refills | Status: DC

## 2020-11-19 MED ORDER — DOXYCYCLINE HYCLATE 100 MG PO CAPS: 100.0000 mg | ORAL_CAPSULE | Freq: Two times a day (BID) | ORAL | 0 refills | Status: AC

## 2020-11-19 MED ORDER — HYDROCORTISONE ACETATE 25 MG RE SUPP: 25.0000 mg | Freq: Once | RECTAL | Status: DC

## 2020-11-19 MED ORDER — CEFTRIAXONE SODIUM 1 G IJ SOLR
500.0000 mg | Freq: Once | INTRAMUSCULAR | Status: AC
  Administered 2020-11-19: 500 mg via INTRAMUSCULAR
  Filled 2020-11-19: qty 10

## 2020-11-19 MED ORDER — HYDROCORTISONE ACETATE 25 MG RE SUPP: 25.0000 mg | Freq: Two times a day (BID) | RECTAL | 1 refills | Status: AC

## 2020-11-19 MED ORDER — DOXYCYCLINE HYCLATE 100 MG PO CAPS: 100.0000 mg | ORAL_CAPSULE | Freq: Two times a day (BID) | ORAL | 0 refills | Status: DC

## 2020-11-19 NOTE — ED Provider Notes (Signed)
[facility] Emergency Department Provider Note   ____________________________________________   I have reviewed the triage vital signs and the nursing notes.   HISTORY  Chief Complaint GI Bleeding   History limited by: Not Limited   HPI Penny Gomez is a 35 y.o. female who presents to the emergency department today because of concerns for both GI bleeding as well as abnormal vaginal discharge.  Patient states over the past couple days she has noticed some bright red blood with her stool.  She will then noticed this on the toilet paper as well.  The patient says she has a history of external hemorrhoids but has not appreciated any external hemorrhoids currently.  No rectal pain with this.  Patient does state that she strains and spends a lot of time on the toilet.  Additionally the patient is concerned because of abnormal vaginal discharge.  She has noticed some discomfort and bad odor.  She states she has had new sexual partners.  Records reviewed. Per medical record review patient has a history of bacterial vaginosis.   Past Medical History:  Diagnosis Date   Breast lump     There are no problems to display for this patient.   Past Surgical History:  Procedure Laterality Date   BREAST BIOPSY Right 05/20/2019   Affirm Bx- X- clip, path pending   CESAREAN SECTION     TUBAL LIGATION      Prior to Admission medications   Medication Sig Start Date End Date Taking? Authorizing Provider  fluconazole (DIFLUCAN) 150 MG tablet Take 1 tablet now, repeat in 3 days if symptoms still present. 05/29/19   [provider]    Allergies Patient has no known allergies.  Family History  Problem Relation Age of Onset   Breast cancer Mother 53   Breast cancer Maternal Aunt 80   Breast cancer Cousin 73    Social History Social History   Tobacco Use   Smoking status: Never   Smokeless tobacco: Never  Vaping Use   Vaping Use: Never used  Substance  Use Topics   Alcohol use: Not Currently   Drug use: No    Review of Systems Constitutional: No fever/chills Eyes: No visual changes. ENT: No sore throat. Cardiovascular: Denies chest pain. Respiratory: Denies shortness of breath. Gastrointestinal: Positive for rectal bleeding.  Genitourinary: Positive for vaginal discharge.  Musculoskeletal: Negative for back pain. Skin: Negative for rash. Neurological: Negative for headaches, focal weakness or numbness.  ____________________________________________   PHYSICAL EXAM:  VITAL SIGNS: ED Triage Vitals [11/19/20 1853]  Enc Vitals Group     BP 117/88     Pulse Rate (!) 104     Resp 17     Temp 98.2 F (36.8 C)     Temp Source Oral     SpO2 100 %     Weight 137 lb (62.1 kg)     Height 5' (1.524 m)     Head Circumference      Peak Flow      Pain Score 0   Constitutional: Alert and oriented.  Eyes: Conjunctivae are normal.  ENT      Head: Normocephalic and atraumatic.      Nose: No congestion/rhinnorhea.      Mouth/Throat: Mucous membranes are moist.      Neck: No stridor. Hematological/Lymphatic/Immunilogical: No cervical lymphadenopathy. Cardiovascular: Normal rate, regular rhythm.  No murmurs, rubs, or gallops.  Respiratory: Normal respiratory effort without tachypnea nor retractions. Breath sounds are clear and  equal bilaterally. No wheezes/rales/rhonchi. Gastrointestinal: Soft and non tender. No rebound. No guarding.  Genitourinary: Deferred Musculoskeletal: Normal range of motion in all extremities. No lower extremity edema. Neurologic:  Normal speech and language. No gross focal neurologic deficits are appreciated.  Skin:  Skin is warm, dry and intact. No rash noted. Psychiatric: Mood and affect are normal. Speech and behavior are normal. Patient exhibits appropriate insight and judgment.  ____________________________________________    LABS (pertinent positives/negatives)  Gonorrhea positive HIV  negative CMP wnl except glu 100 CBC wbc 9.9, hgb 10.8, plt 273  ____________________________________________   EKG  None  ____________________________________________    RADIOLOGY  None  ____________________________________________   PROCEDURES  Procedures  ____________________________________________   INITIAL IMPRESSION / ASSESSMENT AND PLAN / ED COURSE  Pertinent labs & imaging results that were available during my care of the patient were reviewed by me and considered in my medical decision making (see chart for details).   Patient presented to the emergency department today because of concern for rectal bleeding and abnormal vaginal discharge. Do think likely hemorrhoid causing the bleeding. In terms of abnormal vaginal discharge patient did test positive for gonorrhea. Discussed this with the patient. Discussed care with antibiotics and discussing with sexual partners.   ____________________________________________   FINAL CLINICAL IMPRESSION(S) / ED DIAGNOSES  Final diagnoses:  Gonorrhea     Note: This dictation was prepared with Dragon dictation. Any transcriptional errors that result from this process are unintentional     [name], MD 11/19/20 2325

## 2020-11-19 NOTE — Discharge Instructions (Addendum)
Please seek medical attention for any high fevers, chest pain, shortness of breath, change in behavior, persistent vomiting, bloody stool or any other new or concerning symptoms.  

## 2020-11-19 NOTE — ED Triage Notes (Signed)
Pt to ER via POV with complaints of possible GI bleeding. Pt reports for the last several days she has been seeing red streaks of blood in her stool, today was significantly worse. Also reports blood on toilet paper after wiping.  Pt also reports foul smelling vaginal discharge that started today. Reports is concerned for possible STD due to new sexual partners.

## 2020-11-20 LAB — TYPE AND SCREEN
ABO/RH(D): B POS
Antibody Screen: NEGATIVE

## 2020-11-20 LAB — RPR: RPR Ser Ql: NONREACTIVE

## 2021-08-25 ENCOUNTER — Encounter: Payer: Self-pay | Admitting: Emergency Medicine

## 2021-08-25 ENCOUNTER — Other Ambulatory Visit: Payer: Self-pay

## 2021-08-25 ENCOUNTER — Emergency Department
Admission: EM | Admit: 2021-08-25 | Discharge: 2021-08-25 | Disposition: A | Discharge status: Home / Self Care | Payer: BC Managed Care – PPO | Attending: Emergency Medicine | Admitting: Emergency Medicine

## 2021-08-25 DIAGNOSIS — N390 Urinary tract infection, site not specified: Secondary | ICD-10-CM | POA: Diagnosis not present

## 2021-08-25 DIAGNOSIS — R300 Dysuria: Secondary | ICD-10-CM | POA: Diagnosis present

## 2021-08-25 LAB — URINALYSIS, ROUTINE W REFLEX MICROSCOPIC
Bilirubin Urine: NEGATIVE
Glucose, UA: NEGATIVE mg/dL
Ketones, ur: NEGATIVE mg/dL
Nitrite: NEGATIVE
Protein, ur: 100 mg/dL — AB
RBC / HPF: >50 RBC/hpf — ABNORMAL HIGH (ref 0–5)
Specific Gravity, Urine: 1.019 (ref 1.005–1.030)
WBC, UA: >50 WBC/hpf — ABNORMAL HIGH (ref 0–5)
pH: 6 (ref 5.0–8.0)

## 2021-08-25 LAB — POC URINE PREG, ED: Preg Test, Ur: NEGATIVE

## 2021-08-25 MED ORDER — PHENAZOPYRIDINE HCL 200 MG PO TABS: 200.0000 mg | ORAL_TABLET | Freq: Three times a day (TID) | ORAL | 0 refills | Status: DC | PRN

## 2021-08-25 MED ORDER — CEFDINIR 300 MG PO CAPS: 300.0000 mg | ORAL_CAPSULE | Freq: Two times a day (BID) | ORAL | 0 refills | Status: AC

## 2021-08-25 NOTE — Discharge Instructions (Signed)
Follow-up with your regular doctor if not improving in 3 days.  Return emergency department worsening.

## 2021-08-25 NOTE — ED Triage Notes (Signed)
C/O burning with urination.  Onset of symptoms yesterday, worse today.

## 2021-08-25 NOTE — ED Provider Notes (Signed)
[facility] Provider Note    Event Date/Time   First MD Initiated Contact with Patient 08/25/21 [phone]     (approximate)   History   Dysuria   HPI  Penny Gomez is a 36 y.o. female with no pertinent past medical history presents emergency department complaining of dysuria.  No vaginal discharge.  Patient states she is currently on her menstrual cycle.  Patient denies fever, chills, vomiting.      Physical Exam   Triage Vital Signs: ED Triage Vitals  Enc Vitals Group     BP 08/25/21 0901 126/75     Pulse Rate 08/25/21 0901 88     Resp 08/25/21 0901 16     Temp 08/25/21 0901 98.3 F (36.8 C)     Temp Source 08/25/21 0901 Oral     SpO2 08/25/21 0901 100 %     Weight 08/25/21 0859 136 lb 14.5 oz (62.1 kg)     Height 08/25/21 0859 5' (1.524 m)     Head Circumference --      Peak Flow --      Pain Score 08/25/21 0859 0     Pain Loc --      Pain Edu? --      Excl. in [place]? --     Most recent vital signs: Vitals:   08/25/21 0901  BP: 126/75  Pulse: 88  Resp: 16  Temp: 98.3 F (36.8 C)  SpO2: 100%     General: Awake, no distress.   CV:  Good peripheral perfusion. regular rate and  rhythm Resp:  Normal effort. Abd:  No distention.  Nontender, no CVA tenderness Other:      ED Results / Procedures / Treatments   Labs (all labs ordered are listed, but only abnormal results are displayed) Labs Reviewed  URINALYSIS, ROUTINE W REFLEX MICROSCOPIC - Abnormal; Notable for the following components:      Result Value   Color, Urine YELLOW (*)    APPearance CLOUDY (*)    Hgb urine dipstick LARGE (*)    Protein, ur 100 (*)    Leukocytes,Ua MODERATE (*)    RBC / HPF >50 (*)    WBC, UA >50 (*)    Bacteria, UA RARE (*)    All other components within normal limits  URINE CULTURE  POC URINE PREG, ED     EKG     RADIOLOGY     PROCEDURES:   Procedures   MEDICATIONS ORDERED IN ED: Medications - No data to  display   IMPRESSION / MDM / [place] / ED COURSE  I reviewed the triage vital signs and the nursing notes.                              Differential diagnosis includes, but is not limited to, UTI, STI, dysuria, cystitis  Patient's presentation is most consistent with acute complicated illness / injury requiring diagnostic workup.   Patient's urinalysis is consistent with infection, POC pregnancy is negative.  I did order a urine culture to assess that I have her on the correct antibiotic.  She was placed on Omnicef twice daily for 7 days.  She was also given Pyridium for pain.  She is to follow-up with her regular doctor if not improving in 3 days.  Return emergency department worsening.  Patient is in agreement treatment plan.  She is discharged stable condition.  FINAL CLINICAL IMPRESSION(S) / ED DIAGNOSES   Final diagnoses:  Acute UTI     Rx / DC Orders   ED Discharge Orders          Ordered    cefdinir (OMNICEF) 300 MG capsule  2 times daily        08/25/21 1024    phenazopyridine (PYRIDIUM) 200 MG tablet  3 times daily PRN        08/25/21 1024             Note:  This document was prepared using Dragon voice recognition software and may include unintentional dictation errors.    Versie Starks, PA-C 08/25/21 1025    [name], MD 08/25/21 1459

## 2021-08-28 LAB — URINE CULTURE: Culture: 50000 — AB

## 2021-09-10 ENCOUNTER — Other Ambulatory Visit: Payer: Self-pay

## 2021-09-10 ENCOUNTER — Emergency Department
Admission: EM | Admit: 2021-09-10 | Discharge: 2021-09-10 | Disposition: A | Discharge status: Home / Self Care | Payer: BC Managed Care – PPO | Attending: Emergency Medicine | Admitting: Emergency Medicine

## 2021-09-10 DIAGNOSIS — H1089 Other conjunctivitis: Secondary | ICD-10-CM | POA: Diagnosis not present

## 2021-09-10 DIAGNOSIS — H5711 Ocular pain, right eye: Secondary | ICD-10-CM | POA: Diagnosis present

## 2021-09-10 MED ORDER — EYE WASH OP SOLN
1.0000 [drp] | OPHTHALMIC | Status: DC | PRN
  Filled 2021-09-10: qty 118

## 2021-09-10 MED ORDER — TOBRAMYCIN 0.3 % OP SOLN: 2.0000 [drp] | OPHTHALMIC | 0 refills | Status: DC

## 2021-09-10 MED ORDER — FLUORESCEIN SODIUM 1 MG OP STRP
1.0000 | ORAL_STRIP | Freq: Once | OPHTHALMIC | Status: AC
  Administered 2021-09-10: 1 via OPHTHALMIC
  Filled 2021-09-10: qty 1

## 2021-09-10 MED ORDER — TETRACAINE HCL 0.5 % OP SOLN
1.0000 [drp] | Freq: Once | OPHTHALMIC | Status: AC
  Administered 2021-09-10: 1 [drp] via OPHTHALMIC
  Filled 2021-09-10: qty 4

## 2021-09-10 NOTE — ED Notes (Signed)
See triage note   Presents with pain to right eye  States pain started yesterday

## 2021-09-10 NOTE — Discharge Instructions (Signed)
Follow-up with Dr. Wallace Going who is on-call for [facility] if you continue to have problems with your eye.  A prescription for Tobrex was sent to the pharmacy to use 2 drops every 4 hours while awake.  Should your left eye began getting red you will need to use it in your left eye as well.

## 2021-09-10 NOTE — ED Provider Notes (Signed)
[facility] Provider Note    Event Date/Time   First MD Initiated Contact with Patient 09/10/21 1056     (approximate)   History   Eye Pain (Right /)   HPI  Penny Gomez is a 36 y.o. female   presents to the ED with complaint of some mild pinkish discoloration to her right sclera today.  Patient denies any photophobia, visual changes or injury.  She denies the sensation of a foreign body.  Patient denies her eyelashes matting shut first thing this morning.  She is not aware of any exposure to pinkeye.      Physical Exam   Triage Vital Signs: ED Triage Vitals  Enc Vitals Group     BP 09/10/21 1033 126/78     Pulse Rate 09/10/21 1033 93     Resp 09/10/21 1033 19     Temp 09/10/21 1033 98.7 F (37.1 C)     Temp Source 09/10/21 1033 Oral     SpO2 09/10/21 1033 98 %     Weight 09/10/21 1034 136 lb 11 oz (62 kg)     Height 09/10/21 1034 5' (1.524 m)     Head Circumference --      Peak Flow --      Pain Score 09/10/21 1033 5     Pain Loc --      Pain Edu? --      Excl. in [place]? --     Most recent vital signs: Vitals:   09/10/21 1033  BP: 126/78  Pulse: 93  Resp: 19  Temp: 98.7 F (37.1 C)  SpO2: 98%     General: Awake, no distress.  CV:  Good peripheral perfusion.  Resp:  Normal effort.  Abd:  No distention.  Other:  Right conjunctive a is mildly injected.  There is some minimal exudate noted to the inner corner of the right eye which is not seen in the left.  Tetracaine was placed in the eye and upper lid was inverted.  No foreign body noted.  Fluorescein strip was applied and no corneal abrasion was noted.  Visual acuity noted with glasses 20/20 bilaterally. PERRLA, EOM's intact and without pain.  Irrigated with eyewash.   ED Results / Procedures / Treatments   Labs (all labs ordered are listed, but only abnormal results are displayed) Labs Reviewed - No data to display    PROCEDURES:  Critical Care performed:    Procedures   MEDICATIONS ORDERED IN ED: Medications  eye wash ((SODIUM/POTASSIUM/SOD CHLORIDE)) ophthalmic solution 1 drop (has no administration in time range)  tetracaine (PONTOCAINE) 0.5 % ophthalmic solution 1 drop (1 drop Right Eye Given by Other 09/10/21 1140)  fluorescein ophthalmic strip 1 strip (1 strip Left Eye Given 09/10/21 1140)     IMPRESSION / MDM / ASSESSMENT AND PLAN / ED COURSE  I reviewed the triage vital signs and the nursing notes.   Differential diagnosis includes, but is not limited to, bacterial conjunctivitis, allergic conjunctivitis, viral conjunctivitis, corneal abrasion, foreign body.  36 year old female presents to the ED with complaint of minimal white eye discoloration this morning when she woke without pain or history of injury.  Visual acuity was 20/20 bilaterally with her glasses.  Physical exam was benign with the exception of some very small drainage noted to the right eye without involvement of the lashes.  Patient was made aware that this should clear in approximately 24 hours with the Tobrex ophthalmic solution.  She was also  given the name of the ophthalmologist on-call for [facility] if she continues to have problems with her right eye.      Patient's presentation is most consistent with acute complicated illness / injury requiring diagnostic workup.  FINAL CLINICAL IMPRESSION(S) / ED DIAGNOSES   Final diagnoses:  Other conjunctivitis of right eye     Rx / DC Orders   ED Discharge Orders          Ordered    tobramycin (TOBREX) 0.3 % ophthalmic solution  Every 4 hours        09/10/21 1158             Note:  This document was prepared using Dragon voice recognition software and may include unintentional dictation errors.   Johnn Hai, PA-C 09/10/21 1214    Harvest Dark, MD 09/10/21 1442

## 2021-09-10 NOTE — ED Triage Notes (Signed)
Pt states she has no blurry or double vision, denies injury. Pt states right eye began hurting yesterday and sclera is pink today with sharp pain more with light exposure.

## 2021-09-12 ENCOUNTER — Emergency Department
Admission: EM | Admit: 2021-09-12 | Discharge: 2021-09-12 | Disposition: A | Discharge status: Home / Self Care | Payer: BC Managed Care – PPO | Attending: Emergency Medicine | Admitting: Emergency Medicine

## 2021-09-12 ENCOUNTER — Other Ambulatory Visit: Payer: Self-pay

## 2021-09-12 DIAGNOSIS — N898 Other specified noninflammatory disorders of vagina: Secondary | ICD-10-CM | POA: Insufficient documentation

## 2021-09-12 DIAGNOSIS — R300 Dysuria: Secondary | ICD-10-CM | POA: Insufficient documentation

## 2021-09-12 DIAGNOSIS — R350 Frequency of micturition: Secondary | ICD-10-CM | POA: Diagnosis not present

## 2021-09-12 DIAGNOSIS — D72829 Elevated white blood cell count, unspecified: Secondary | ICD-10-CM | POA: Diagnosis not present

## 2021-09-12 LAB — URINALYSIS, ROUTINE W REFLEX MICROSCOPIC
Bilirubin Urine: NEGATIVE
Glucose, UA: NEGATIVE mg/dL
Hgb urine dipstick: NEGATIVE
Ketones, ur: NEGATIVE mg/dL
Nitrite: NEGATIVE
Protein, ur: NEGATIVE mg/dL
Specific Gravity, Urine: 1.023 (ref 1.005–1.030)
pH: 5 (ref 5.0–8.0)

## 2021-09-12 LAB — WET PREP, GENITAL
Clue Cells Wet Prep HPF POC: NONE SEEN
Sperm: NONE SEEN
Trich, Wet Prep: NONE SEEN
WBC, Wet Prep HPF POC: >=10 — AB (ref <10)
Yeast Wet Prep HPF POC: NONE SEEN

## 2021-09-12 LAB — CHLAMYDIA/NGC RT PCR (ARMC ONLY)
Chlamydia Tr: NOT DETECTED
N gonorrhoeae: NOT DETECTED

## 2021-09-12 LAB — PREGNANCY, URINE: Preg Test, Ur: NEGATIVE

## 2021-09-12 MED ORDER — LIDOCAINE HCL (PF) 1 % IJ SOLN
INTRAMUSCULAR | Status: AC
  Administered 2021-09-12: 2.1 mL
  Filled 2021-09-12: qty 5

## 2021-09-12 MED ORDER — DOXYCYCLINE MONOHYDRATE 100 MG PO TABS: 100.0000 mg | ORAL_TABLET | Freq: Two times a day (BID) | ORAL | 0 refills | Status: AC

## 2021-09-12 MED ORDER — NITROFURANTOIN MONOHYD MACRO 100 MG PO CAPS: 100.0000 mg | ORAL_CAPSULE | Freq: Two times a day (BID) | ORAL | 0 refills | Status: AC

## 2021-09-12 MED ORDER — CEFTRIAXONE SODIUM 1 G IJ SOLR
500.0000 mg | Freq: Once | INTRAMUSCULAR | Status: AC
  Administered 2021-09-12: 500 mg via INTRAMUSCULAR
  Filled 2021-09-12: qty 10

## 2021-09-12 MED ORDER — DOXYCYCLINE HYCLATE 100 MG PO TABS
100.0000 mg | ORAL_TABLET | Freq: Once | ORAL | Status: AC
  Administered 2021-09-12: 100 mg via ORAL
  Filled 2021-09-12: qty 1

## 2021-09-12 NOTE — ED Triage Notes (Signed)
Pt c/o "knot" in R groin area, vaginal discharge, and dysuria.  Pt reports discharge is "watery" and orderless.  Pain score 6/10.

## 2021-09-12 NOTE — ED Provider Notes (Signed)
[place] EMERGENCY DEPARTMENT Provider Note   CSN: [id number] Arrival date & time: 09/12/21  1529     History  Chief Complaint  Patient presents with   Groin Pain   Vaginal Discharge   Dysuria    Penny Gomez is a 36 y.o. female presents to the emergency department for evaluation of a sore nodule in her right groin along with watery vaginal discharge and slight dysuria with increase in urinary frequency.  Symptoms been ongoing since Sunday, 2 days.  No fevers, nausea, vomiting or back pain.  No new sexual partners or concerns for STDs.  HPI     Home Medications Prior to Admission medications   Medication Sig Start Date End Date Taking? Authorizing Provider  fluconazole (DIFLUCAN) 150 MG tablet Take 1 tablet now, repeat in 3 days if symptoms still present. 05/29/19   [provider]  hydrocortisone (ANUSOL-HC) 25 MG suppository Place 1 suppository (25 mg total) rectally every 12 (twelve) hours. 11/19/20 11/19/21  [name], MD  phenazopyridine (PYRIDIUM) 200 MG tablet Take 1 tablet (200 mg total) by mouth 3 (three) times daily as needed for pain. 08/25/21 08/25/22  Fisher, Linden Dolin, PA-C  tobramycin (TOBREX) 0.3 % ophthalmic solution Place 2 drops into the right eye every 4 (four) hours. While awake 09/10/21   Johnn Hai, PA-C      Allergies    Patient has no known allergies.    Review of Systems   Review of Systems  Physical Exam Updated Vital Signs BP 138/79 (BP Location: Left Arm)   Pulse 77   Temp 98.3 F (36.8 C) (Oral)   Resp 16   Ht 5' (1.524 m)   Wt 61.7 kg   LMP 08/21/2021 (Exact Date)   SpO2 100%   BMI 26.56 kg/m  Physical Exam Constitutional:      Appearance: She is well-developed.  HENT:     Head: Normocephalic and atraumatic.  Eyes:     Conjunctiva/sclera: Conjunctivae normal.  Cardiovascular:     Rate and Rhythm: Normal rate.  Pulmonary:     Effort: Pulmonary effort is normal. No respiratory distress.   Abdominal:     General: There is no distension.     Tenderness: There is no abdominal tenderness. There is no right CVA tenderness, left CVA tenderness or guarding.  Genitourinary:    Vagina: Vaginal discharge present.     Comments: Mild palpable lymphadenopathy right groin, no fluctuant abscess, warmth redness or induration.  No vulvar lesions, whitish discharge present, no cervical motion tenderness.  No pain on exam. Musculoskeletal:        General: Normal range of motion.     Cervical back: Normal range of motion.  Skin:    General: Skin is warm.     Findings: No rash.  Neurological:     General: No focal deficit present.     Mental Status: She is alert and oriented to person, place, and time. Mental status is at baseline.  Psychiatric:        Behavior: Behavior normal.        Thought Content: Thought content normal.     ED Results / Procedures / Treatments   Labs (all labs ordered are listed, but only abnormal results are displayed) Labs Reviewed  URINALYSIS, ROUTINE W REFLEX MICROSCOPIC - Abnormal; Notable for the following components:      Result Value   Color, Urine YELLOW (*)    APPearance HAZY (*)    Leukocytes,Ua  MODERATE (*)    Bacteria, UA FEW (*)    All other components within normal limits  WET PREP, GENITAL  CHLAMYDIA/NGC RT PCR ([facility] ONLY)            PREGNANCY, URINE    EKG None  Radiology No results found.  Procedures Procedures    Medications Ordered in ED Medications - No data to display  ED Course/ Medical Decision Making/ A&P                           Medical Decision Making Amount and/or Complexity of Data Reviewed Labs: ordered.   36 year old female with dysuria, vaginal discharge.  No abdominal pain, back pain, fevers, nausea or vomiting.  Urinalysis ordered and reviewed by me today shows no sign of pregnancy, she has moderate leukocytes with elevated WBCs.  A wet prep was obtained showing no signs of BV, trichomonas, yeast.  WBCs  were present and vaginal discharge culture.  I suspect high probability for STD based on discharge, will treat with ceftriaxone and doxycycline.  We will also place on a 5-day course of Macrobid for possible UTI.  Patient will notify all sexual partners and avoid social interaction.  She understands signs symptoms return to the ER for. Final Clinical Impression(s) / ED Diagnoses Final diagnoses:  None    Rx / DC Orders ED Discharge Orders     None         Renata Caprice 09/12/21 1844    Carrie Mew, MD 09/12/21 [phone]

## 2021-09-12 NOTE — Discharge Instructions (Signed)
Please take all antibiotics as prescribed.  Return to the ER for any increasing pain, fevers, worsening symptoms or any urgent changes in your health.  Please avoid any sexual activity until all symptoms resolve.

## 2021-09-12 NOTE — ED Provider Triage Note (Signed)
Emergency Medicine Provider Triage Evaluation Note  Penny Gomez, a 36 y.o. female  was evaluated in triage.  Pt complains of vaginal discharge as well as a tender nodule on the right side of her groin.  Patient also believes she has a tender lesion in her vulvar area.  Review of Systems  Positive: Vag discharge, reactive lymph node Negative: FCS  Physical Exam  BP 138/79 (BP Location: Left Arm)   Pulse 77   Temp 98.3 F (36.8 C) (Oral)   Resp 16   LMP 08/21/2021 (Exact Date)   SpO2 100%  Gen:   Awake, no distress  NAD Resp:  Normal effort  MSK:   Moves extremities without difficulty  Other:    Medical Decision Making  Medically screening exam initiated at 4:08 PM.  Appropriate orders placed.  Penny Gomez was informed that the remainder of the evaluation will be completed by another provider, this initial triage assessment does not replace that evaluation, and the importance of remaining in the ED until their evaluation is complete.  Patient to the ED for evaluation of a vaginal discharge as well as a tender lymph node to the right groin.   Melvenia Needles, PA-C 09/12/21 1609

## 2021-12-03 ENCOUNTER — Encounter: Payer: Self-pay | Admitting: *Deleted

## 2021-12-22 ENCOUNTER — Emergency Department
Admission: EM | Admit: 2021-12-22 | Discharge: 2021-12-22 | Disposition: A | Discharge status: Home / Self Care | Payer: BC Managed Care – PPO | Attending: Emergency Medicine | Admitting: Emergency Medicine

## 2021-12-22 ENCOUNTER — Other Ambulatory Visit: Payer: Self-pay

## 2021-12-22 ENCOUNTER — Encounter: Payer: Self-pay | Admitting: Emergency Medicine

## 2021-12-22 DIAGNOSIS — N760 Acute vaginitis: Secondary | ICD-10-CM | POA: Insufficient documentation

## 2021-12-22 DIAGNOSIS — B9689 Other specified bacterial agents as the cause of diseases classified elsewhere: Secondary | ICD-10-CM | POA: Insufficient documentation

## 2021-12-22 LAB — WET PREP, GENITAL
Sperm: NONE SEEN
Trich, Wet Prep: NONE SEEN
WBC, Wet Prep HPF POC: <10 (ref <10)
Yeast Wet Prep HPF POC: NONE SEEN

## 2021-12-22 LAB — URINALYSIS, ROUTINE W REFLEX MICROSCOPIC
Bilirubin Urine: NEGATIVE
Glucose, UA: NEGATIVE mg/dL
Ketones, ur: NEGATIVE mg/dL
Leukocytes,Ua: NEGATIVE
Nitrite: NEGATIVE
Protein, ur: 30 mg/dL — AB
Specific Gravity, Urine: 1.026 (ref 1.005–1.030)
pH: 5 (ref 5.0–8.0)

## 2021-12-22 MED ORDER — METRONIDAZOLE 500 MG PO TABS: 500.0000 mg | ORAL_TABLET | Freq: Two times a day (BID) | ORAL | 0 refills | Status: AC

## 2021-12-22 NOTE — ED Provider Notes (Signed)
[facility] Provider Note    Event Date/Time   First MD Initiated Contact with Patient 12/22/21 [phone]     (approximate)   History   Chief Complaint No chief complaint on file.   HPI Penny Gomez is a 36 y.o. female, no significant medical history, presents to the emergency department for evaluation of vaginal irritation.  She states that she is having itchiness and pain along the vaginal region.  She states she had sex last weekend and says that it was extremely painful.  She states that she has never felt like this before.  Denies fever/chills, chest pain, vaginal bleeding, vaginal discharge, shortness of breath, abdominal pain, flank pain, nausea/vomiting, diarrhea, dysuria, or rash/lesions.  History Limitations: No limitations.        Physical Exam  Triage Vital Signs: ED Triage Vitals  Enc Vitals Group     BP 12/22/21 0937 (!) 130/109     Pulse Rate 12/22/21 0937 78     Resp 12/22/21 0937 18     Temp 12/22/21 0937 98.2 F (36.8 C)     Temp Source 12/22/21 0937 Oral     SpO2 12/22/21 0937 100 %     Weight 12/22/21 0940 130 lb (59 kg)     Height 12/22/21 0940 5' (1.524 m)     Head Circumference --      Peak Flow --      Pain Score 12/22/21 0940 5     Pain Loc --      Pain Edu? --      Excl. in [place]? --     Most recent vital signs: Vitals:   12/22/21 0937  BP: (!) 130/109  Pulse: 78  Resp: 18  Temp: 98.2 F (36.8 C)  SpO2: 100%    General: Awake, NAD.  Skin: Warm, dry. No rashes or lesions.  Eyes: PERRL. Conjunctivae normal.  CV: Good peripheral perfusion.  Resp: Normal effort.  Abd: Soft, non-tender. No distention.  Neuro: At baseline. No gross neurological deficits.  Musculoskeletal: Normal ROM of all extremities.   Physical Exam    ED Results / Procedures / Treatments  Labs (all labs ordered are listed, but only abnormal results are displayed) Labs Reviewed  WET PREP, GENITAL - Abnormal; Notable for the following  components:      Result Value   Clue Cells Wet Prep HPF POC PRESENT (*)    All other components within normal limits  URINALYSIS, ROUTINE W REFLEX MICROSCOPIC - Abnormal; Notable for the following components:   Color, Urine YELLOW (*)    APPearance HAZY (*)    Hgb urine dipstick LARGE (*)    Protein, ur 30 (*)    Bacteria, UA RARE (*)    All other components within normal limits     EKG NA    RADIOLOGY  ED Provider Interpretation: N/A  No results found.  PROCEDURES:  Critical Care performed: N/A  Procedures    MEDICATIONS ORDERED IN ED: Medications - No data to display   IMPRESSION / MDM / [place] / ED COURSE  I reviewed the triage vital signs and the nursing notes.                              Differential diagnosis includes, but is not limited to, bacterial vaginosis, trichomoniasis, cystitis, vaginal candidiasis.  Assessment/Plan Presentation consistent with bacterial vaginosis, confirmed by wet prep.  No evidence of  urinary tract infection on urinalysis.  She otherwise appears clinically stable.  Vitals are within normal limits.  She is afebrile.  We will provide her with a prescription for metronidazole.  Encouraged her to follow-up with her OB/GYN as needed.  Will discharge.  Provided the patient with anticipatory guidance, return precautions, and educational material. Encouraged the patient to return to the emergency department at any time if they begin to experience any new or worsening symptoms. Patient expressed understanding and agreed with the plan.   Patient's presentation is most consistent with acute complicated illness / injury requiring diagnostic workup.       FINAL CLINICAL IMPRESSION(S) / ED DIAGNOSES   Final diagnoses:  Bacterial vaginosis     Rx / DC Orders   ED Discharge Orders          Ordered    metroNIDAZOLE (FLAGYL) 500 MG tablet  2 times daily        12/22/21 1112             Note:  This document was  prepared using Dragon voice recognition software and may include unintentional dictation errors.   Teodoro Spray, [place] 12/22/21 1538    Carrie Mew, MD 12/24/21 [phone]

## 2021-12-22 NOTE — ED Notes (Signed)
See triage note. Pt self swabbed for wet prep.

## 2021-12-22 NOTE — ED Triage Notes (Addendum)
Pt reports woke up this am with vaginal irritation. Pt states it is her time of the month but she has never felt like this. Pt admits to having unprotected sex last weekend and states that it was painful

## 2021-12-22 NOTE — Discharge Instructions (Addendum)
-  Please take the full course of the metronidazole as prescribed.  Do not consume any alcoholic beverages while you are taking this medication as may make you very sick.  -Return to the emergency department anytime if you begin to experience any new or worsening symptoms.

## 2022-01-27 ENCOUNTER — Other Ambulatory Visit: Payer: Self-pay

## 2022-01-27 ENCOUNTER — Emergency Department
Admission: EM | Admit: 2022-01-27 | Discharge: 2022-01-27 | Disposition: A | Discharge status: Home / Self Care | Payer: Self-pay | Attending: Emergency Medicine | Admitting: Emergency Medicine

## 2022-01-27 ENCOUNTER — Emergency Department: Payer: Self-pay

## 2022-01-27 DIAGNOSIS — D72829 Elevated white blood cell count, unspecified: Secondary | ICD-10-CM | POA: Insufficient documentation

## 2022-01-27 DIAGNOSIS — Z3A14 14 weeks gestation of pregnancy: Secondary | ICD-10-CM | POA: Insufficient documentation

## 2022-01-27 DIAGNOSIS — O26851 Spotting complicating pregnancy, first trimester: Secondary | ICD-10-CM | POA: Insufficient documentation

## 2022-01-27 DIAGNOSIS — O3680X0 Pregnancy with inconclusive fetal viability, not applicable or unspecified: Secondary | ICD-10-CM

## 2022-01-27 DIAGNOSIS — O26891 Other specified pregnancy related conditions, first trimester: Secondary | ICD-10-CM | POA: Insufficient documentation

## 2022-01-27 LAB — COMPREHENSIVE METABOLIC PANEL
ALT: 13 U/L (ref 0–44)
AST: 19 U/L (ref 15–41)
Albumin: 3.7 g/dL (ref 3.5–5.0)
Alkaline Phosphatase: 51 U/L (ref 38–126)
Anion gap: 5 (ref 5–15)
BUN: 14 mg/dL (ref 6–20)
CO2: 24 mmol/L (ref 22–32)
Calcium: 9 mg/dL (ref 8.9–10.3)
Chloride: 107 mmol/L (ref 98–111)
Creatinine, Ser: 0.52 mg/dL (ref 0.44–1.00)
GFR, Estimated: >60 mL/min (ref >60)
Glucose, Bld: 110 mg/dL — ABNORMAL HIGH (ref 70–99)
Potassium: 3.5 mmol/L (ref 3.5–5.1)
Sodium: 136 mmol/L (ref 135–145)
Total Bilirubin: 0.5 mg/dL (ref 0.3–1.2)
Total Protein: 7.6 g/dL (ref 6.5–8.1)

## 2022-01-27 LAB — CBC WITH DIFFERENTIAL/PLATELET
Abs Immature Granulocytes: 0.03 10*3/uL (ref 0.00–0.07)
Basophils Absolute: 0 10*3/uL (ref 0.0–0.1)
Basophils Relative: 0 %
Eosinophils Absolute: 0 10*3/uL (ref 0.0–0.5)
Eosinophils Relative: 0 %
HCT: 29 % — ABNORMAL LOW (ref 36.0–46.0)
Hemoglobin: 8.1 g/dL — ABNORMAL LOW (ref 12.0–15.0)
Immature Granulocytes: 0 %
Lymphocytes Relative: 28 %
Lymphs Abs: 3.2 10*3/uL (ref 0.7–4.0)
MCH: 18.8 pg — ABNORMAL LOW (ref 26.0–34.0)
MCHC: 27.9 g/dL — ABNORMAL LOW (ref 30.0–36.0)
MCV: 67.3 fL — ABNORMAL LOW (ref 80.0–100.0)
Monocytes Absolute: 0.7 10*3/uL (ref 0.1–1.0)
Monocytes Relative: 6 %
Neutro Abs: 7.6 10*3/uL (ref 1.7–7.7)
Neutrophils Relative %: 66 %
Platelets: 372 10*3/uL (ref 150–400)
RBC: 4.31 MIL/uL (ref 3.87–5.11)
RDW: 22 % — ABNORMAL HIGH (ref 11.5–15.5)
WBC: 11.5 10*3/uL — ABNORMAL HIGH (ref 4.0–10.5)
nRBC: 0 % (ref 0.0–0.2)

## 2022-01-27 LAB — URINALYSIS, ROUTINE W REFLEX MICROSCOPIC
Bilirubin Urine: NEGATIVE
Glucose, UA: NEGATIVE mg/dL
Ketones, ur: NEGATIVE mg/dL
Nitrite: NEGATIVE
Protein, ur: NEGATIVE mg/dL
Specific Gravity, Urine: 1.021 (ref 1.005–1.030)
pH: 7 (ref 5.0–8.0)

## 2022-01-27 LAB — HCG, QUANTITATIVE, PREGNANCY: hCG, Beta Chain, Quant, S: 2724 m[IU]/mL — ABNORMAL HIGH (ref <5)

## 2022-01-27 LAB — POC URINE PREG, ED: Preg Test, Ur: POSITIVE — AB

## 2022-01-27 LAB — ABO/RH: ABO/RH(D): B POS

## 2022-01-27 LAB — LIPASE, BLOOD: Lipase: 59 U/L — ABNORMAL HIGH (ref 11–51)

## 2022-01-27 NOTE — Discharge Instructions (Signed)
Please call and schedule appointment with Dr. Marcelline Mates.

## 2022-01-27 NOTE — ED Provider Triage Note (Signed)
Emergency Medicine Provider Triage Evaluation Note  Penny Gomez , a 36 y.o. female  was evaluated in triage.  Pt complains of spotting.  She was recently treated for UTI and completed her antibiotic yesterday.  She also has some intermittent left lower quadrant pain.  She has not had a regular menstrual cycle since early November.  History of tubal ligation.Marland Kitchen   Physical Exam  BP 122/79   Pulse 90   Temp 98.5 F (36.9 C) (Oral)   Resp 14   Ht 5' (1.524 m)   Wt 59 kg   LMP 12/19/2021 (Approximate)   SpO2 97%   BMI 25.39 kg/m  Gen:   Awake, no distress   Resp:  Normal effort  MSK:   Moves extremities without difficulty  Other:    Medical Decision Making  Medically screening exam initiated at 5:57 PM.  Appropriate orders placed.  Penny Gomez was informed that the remainder of the evaluation will be completed by another provider, this initial triage assessment does not replace that evaluation, and the importance of remaining in the ED until their evaluation is complete.    Victorino Dike, FNP 01/27/22 1859

## 2022-01-27 NOTE — ED Provider Notes (Signed)
[facility] Provider Note  Patient Contact: 7:43 PM (approximate)   History   Vaginal Bleeding   HPI  Penny Gomez is a 36 y.o. female with a history of tubal ligation in 2012, presents to the emergency department with concern for feeling off.  Patient states that she has noticed some light vaginal bleeding that she has noticed in her underwear and states that she does not anticipate her menstrual cycle for another 2 weeks.  She denies suprapubic, pelvic or abdominal pain.  No low back pain.  She states that several weeks ago, she had a urinary tract infection that seemed to resolve without.  She denies chest pain, chest tightness or shortness of breath.      Physical Exam   Triage Vital Signs: ED Triage Vitals  Enc Vitals Group     BP 01/27/22 1748 122/79     Pulse Rate 01/27/22 1748 90     Resp 01/27/22 1748 14     Temp 01/27/22 1748 98.5 F (36.9 C)     Temp Source 01/27/22 1748 Oral     SpO2 01/27/22 1748 97 %     Weight 01/27/22 1749 130 lb (59 kg)     Height 01/27/22 1749 5' (1.524 m)     Head Circumference --      Peak Flow --      Pain Score 01/27/22 1748 0     Pain Loc --      Pain Edu? --      Excl. in [place]? --     Most recent vital signs: Vitals:   01/27/22 1748  BP: 122/79  Pulse: 90  Resp: 14  Temp: 98.5 F (36.9 C)  SpO2: 97%     General: Alert and in no acute distress. Eyes:  PERRL. EOMI. Head: No acute traumatic findings ENT:      Nose: No congestion/rhinnorhea.      Mouth/Throat: Mucous membranes are moist. Neck: No stridor. No cervical spine tenderness to palpation. Hematological/Lymphatic/Immunilogical: No cervical lymphadenopathy. Cardiovascular:  Good peripheral perfusion Respiratory: Normal respiratory effort without tachypnea or retractions. Lungs CTAB. Good air entry to the bases with no decreased or absent breath sounds. Gastrointestinal: Bowel sounds 4 quadrants. Soft and nontender to palpation. No  guarding or rigidity. No palpable masses. No distention. No CVA tenderness. Musculoskeletal: Full range of motion to all extremities.  Neurologic:  No gross focal neurologic deficits are appreciated.  Skin:   No rash noted Other:   ED Results / Procedures / Treatments   Labs (all labs ordered are listed, but only abnormal results are displayed) Labs Reviewed  URINALYSIS, ROUTINE W REFLEX MICROSCOPIC - Abnormal; Notable for the following components:      Result Value   Color, Urine YELLOW (*)    APPearance CLOUDY (*)    Hgb urine dipstick MODERATE (*)    Leukocytes,Ua MODERATE (*)    Bacteria, UA RARE (*)    All other components within normal limits  CBC WITH DIFFERENTIAL/PLATELET - Abnormal; Notable for the following components:   WBC 11.5 (*)    Hemoglobin 8.1 (*)    HCT 29.0 (*)    MCV 67.3 (*)    MCH 18.8 (*)    MCHC 27.9 (*)    RDW 22.0 (*)    All other components within normal limits  COMPREHENSIVE METABOLIC PANEL - Abnormal; Notable for the following components:   Glucose, Bld 110 (*)    All other components within normal limits  LIPASE, BLOOD - Abnormal; Notable for the following components:   Lipase 59 (*)    All other components within normal limits  HCG, QUANTITATIVE, PREGNANCY - Abnormal; Notable for the following components:   hCG, Beta Chain, Quant, S 2,724 (*)    All other components within normal limits  POC URINE PREG, ED - Abnormal; Notable for the following components:   Preg Test, Ur POSITIVE (*)    All other components within normal limits  ABO/RH      RADIOLOGY  I personally viewed and evaluated these images as part of my medical decision making, as well as reviewing the written report by the radiologist.  ED Provider Interpretation: Pregnancy of unknown location.  No acute findings on dedicated OB ultrasound   PROCEDURES:  Critical Care performed: No  Procedures   MEDICATIONS ORDERED IN ED: Medications - No data to  display   IMPRESSION / MDM / [place] / ED COURSE  I reviewed the triage vital signs and the nursing notes.                              Assessment and plan: Pregnancy of unknown location 36 year old female presents to the emergency department with concern for feeling off.  Patient's vital signs were reassuring at triage.  On exam, patient was alert, active and nontoxic-appearing.  Patient had moderate blood on her urinalysis consistent with her complaint of light vaginal bleeding.  Urine pregnancy test was positive.  Patient had mildly elevated white blood cell count and elevated beta hCG.  Dedicated OB ultrasound showed no acute findings.  I reached out to OB/GYN on-call, Dr. Marcelline Mates.  Very much appreciate time and consult.  Dr. Marcelline Mates anticipate seeing patient in the office in 2 days for reassessment.  Recommended return to the emergency department for reevaluation if symptoms worsen at home.  FINAL CLINICAL IMPRESSION(S) / ED DIAGNOSES   Final diagnoses:  Pregnancy of unknown anatomic location     Rx / DC Orders   ED Discharge Orders     None        Note:  This document was prepared using Dragon voice recognition software and may include unintentional dictation errors.   Vallarie Mare [place], PA-C 01/27/22 2106    Rada Hay, MD 01/30/22 1534

## 2022-01-27 NOTE — ED Triage Notes (Addendum)
Pt to ED POV for vaginal spotting since 5 days ago, finished abx yesterday for UTI, period is about 1 week late, LNMP was around 12/19/21.  Pt states that L lower abdominal area is tender to palpation and has a "knot".   Denies UTI symptoms. Had tubes tied after last child born.

## 2022-01-29 ENCOUNTER — Telehealth: Payer: Self-pay | Admitting: Obstetrics and Gynecology

## 2022-01-29 NOTE — Telephone Encounter (Signed)
Reached out to pt to schedule repeat BHCG.  No answer and the mailbox was full.

## 2022-01-29 NOTE — Telephone Encounter (Signed)
I contacted patient via phone. I left messge for patient to call back to scheduled and lab only appointment per [facility]. Then we will scheduled follow up.

## 2022-01-29 NOTE — Telephone Encounter (Signed)
Patient has been scheduled 01/30/2022 for repeat HCG.

## 2022-01-29 NOTE — Telephone Encounter (Signed)
-----   Message from [place] Lodge sent at 01/29/2022  7:45 AM EST ----- Please bring the patient in for repeat BHCG today. ----- Message ----- From: [name], MD Sent: 01/29/2022   6:19 AM EST To: [place] Lodge  Patient only needs repeat BHCG at this time.  Can follow up in office after resulted to determine plan for pregnancy of unknown location.  ----- Message ----- From: [place] Lodge Sent: 01/28/2022   1:26 PM EST To: Judson Roch, MD  Tomorrow, 12/19 @ 4:15pm? ----- Message ----- From: Audree Camel Sent: 01/28/2022  11:45 AM EST To: Judson Roch, MD  Patient is calling to scheduled ER follow up for pregnancy location unknown. Please advise scheduling. No opening in the scheduled patient was advised to follow up with Dr Marcelline Mates.

## 2022-01-30 ENCOUNTER — Other Ambulatory Visit: Payer: Self-pay

## 2022-01-30 DIAGNOSIS — Z3200 Encounter for pregnancy test, result unknown: Secondary | ICD-10-CM

## 2022-01-31 LAB — BETA HCG QUANT (REF LAB): hCG Quant: 1824 m[IU]/mL

## 2022-02-01 ENCOUNTER — Other Ambulatory Visit: Payer: Self-pay

## 2022-02-01 DIAGNOSIS — O200 Threatened abortion: Secondary | ICD-10-CM

## 2022-02-05 ENCOUNTER — Other Ambulatory Visit: Payer: Self-pay

## 2022-02-05 ENCOUNTER — Encounter: Payer: Self-pay | Admitting: Obstetrics and Gynecology

## 2022-02-05 DIAGNOSIS — O200 Threatened abortion: Secondary | ICD-10-CM

## 2022-02-05 NOTE — Telephone Encounter (Signed)
Patient is scheduled for 12/26 for labs HCG

## 2022-02-07 ENCOUNTER — Ambulatory Visit: Payer: Self-pay | Admitting: Obstetrics and Gynecology

## 2022-02-08 ENCOUNTER — Ambulatory Visit (INDEPENDENT_AMBULATORY_CARE_PROVIDER_SITE_OTHER): Payer: Self-pay | Admitting: Obstetrics and Gynecology

## 2022-02-08 VITALS — BP 120/63 | HR 99 | Resp 16 | Ht 60.0 in | Wt 132.1 lb

## 2022-02-08 DIAGNOSIS — O3680X0 Pregnancy with inconclusive fetal viability, not applicable or unspecified: Secondary | ICD-10-CM

## 2022-02-08 DIAGNOSIS — Z9851 Tubal ligation status: Secondary | ICD-10-CM

## 2022-02-08 DIAGNOSIS — Z3A01 Less than 8 weeks gestation of pregnancy: Secondary | ICD-10-CM

## 2022-02-08 NOTE — Progress Notes (Addendum)
GYNECOLOGY PROGRESS NOTE  Subjective:    Patient ID: Penny Gomez, female    DOB: 1985/11/02, 36 y.o.   MRN: [id number]  HPI  Patient is a 36 y.o. [id number] female who presents for evaluation for possible miscarriage vs pregnancy of unknown location. She has a past history tubal ligation in 2012. She was evaluated at ED on 01/27/2022 with concerns for "feeling off". Patient states that she has noticed some light vaginal bleeding that she has noticed in her underwear and states that she does not anticipate her menstrual cycle for another 2 weeks. Urine HCG was performed and it resulted as positive. HCG quantitative test was done and the levels were 2,724. Ultrasound was done and the results revealed: Pregnancy of unknown location. Recommend serial hCG measurements. Beta HCG done on 01/30/2022 and levels were 1824. She has levels checked again on 02/05/2022, results still pending.     OB History  Gravida Para Term Preterm AB Living  '2 2 2     2  '$ SAB IAB Ectopic Multiple Live Births          2    # Outcome Date GA Lbr Len/2nd Weight Sex Delivery Anes PTL Lv  2 Term 2013     CS-LTranv     1 Term 2008     CS-LTranv       GYN History:  Patient's last menstrual period was 12/19/2021 (approximate). Last pap smear: 03/31/2020    The following portions of the patient's history were reviewed and updated as appropriate:  She  has a past medical history of Breast lump and Fibroids.  She  has a past surgical history that includes Breast biopsy (Right, 05/20/2019); Cesarean section; and Tubal ligation.  Her family history includes Breast cancer (age of onset: 65) in her cousin, maternal aunt, and mother. She  reports that she has never smoked. She has never used smokeless tobacco. She reports current alcohol use. She reports that she does not use drugs.  No current outpatient medications on file prior to visit.   No current facility-administered medications on file prior to visit.   She  has No Known Allergies..  Review of Systems A comprehensive review of systems was negative except for: breast tenderness   Objective:   Blood pressure 120/63, pulse 99, resp. rate 16, height 5' (1.524 m), weight 132 lb 1.6 oz (59.9 kg), last menstrual period 12/19/2021. Body mass index is 25.8 kg/m. Blood pressure 120/63, pulse 99, resp. rate 16, height 5' (1.524 m), weight 132 lb 1.6 oz (59.9 kg), last menstrual period 12/19/2021.  General appearance: alert and no distress Abdomen: soft, non-tender; bowel sounds normal; no masses,  no organomegaly Pelvic: deferred   Labs:  Lab Results  Component Value Date   [facility]  01/27/2022    B POS Performed at [facility], [address] [place], [place] [address]      Latest Reference Range & Units 01/27/22 19:30 01/30/22 15:00  hCG Quant mIU/mL  1,824  HCG, Beta Chain, Quant, S <5 mIU/mL 2,724 (H)   (H): Data is abnormally high   Imaging:  CLINICAL DATA:  Pelvic pain. Positive urine pregnancy test. History of tubal ligation.   EXAM: OBSTETRIC <14 WK ULTRASOUND   TECHNIQUE: Transabdominal ultrasound was performed for evaluation of the gestation as well as the maternal uterus and adnexal regions.   COMPARISON:  Pelvic ultrasound 09/18/2020, 03/20/2020; CT abdomen pelvis 03/20/2020   FINDINGS: Intrauterine gestational sac: None   Maternal uterus:  Heterogeneous appearance secondary to multiple leiomyomas as demonstrated on prior exams, measuring up to 5.9 cm intramural leiomyoma at the right aspect of the uterine fundus. The endometrium is not well visualized secondary to the multiple leiomyomas.   Right ovary: A corpus luteal cyst is noted in the right ovary.   Left ovary: Within normal limits.   Other: No abnormal free fluid.   IMPRESSION: 1. Pregnancy of unknown location. Recommend serial hCG measurements. 2. Multiple uterine fibroids, as seen on prior exams.   Assessment:   1. Pregnancy of unknown  anatomic location   2. History of tubal ligation      Plan:   1. Pregnancy of unknown anatomic location - Patient with pregnancy of unknown location. Discussed possibility of miscarriage vs ectopic pregnancy, vs viable IUP.  Initial recent BHCG level trending downward, last one pending. If still trending downward, patient may have recent miscarriage vs spontaneous resolution of ectopic.  If levels trend upward, discussed need for treatment with Methotrexate for ectopic. Given strict ectopic precautions.   2. History of tubal ligation - Patient with h/o piror tubal ligation. Discussed option of further management of fertility. Notes that she still no longer desires fertility.  Discussed option of bilateral salpingectomy vs LARC (IUD). Patient would like to think over her options but would likely lean towards salpingectomy.    A total of 30 minutes were spent during this encounter, including review of previous progress notes, recent imaging and labs, face-to-face with time with patient involving counseling and coordination of care, as well as documentation for current visit.    [name], MD [place]

## 2022-02-09 ENCOUNTER — Encounter: Payer: Self-pay | Admitting: Obstetrics and Gynecology

## 2022-02-09 ENCOUNTER — Emergency Department: Payer: BC Managed Care – PPO

## 2022-02-09 ENCOUNTER — Encounter
Admission: EM | Disposition: A | Discharge status: Home / Self Care | Payer: Self-pay | Source: Home / Self Care | Attending: Emergency Medicine

## 2022-02-09 ENCOUNTER — Telehealth: Payer: Self-pay | Admitting: Obstetrics and Gynecology

## 2022-02-09 ENCOUNTER — Encounter: Payer: Self-pay | Admitting: Anesthesiology

## 2022-02-09 ENCOUNTER — Other Ambulatory Visit: Payer: Self-pay

## 2022-02-09 ENCOUNTER — Emergency Department: Payer: BC Managed Care – PPO | Admitting: Anesthesiology

## 2022-02-09 ENCOUNTER — Ambulatory Visit
Admission: EM | Admit: 2022-02-09 | Discharge: 2022-02-09 | Disposition: A | Discharge status: Home / Self Care | Payer: BC Managed Care – PPO | Attending: Emergency Medicine | Admitting: Emergency Medicine

## 2022-02-09 DIAGNOSIS — O009 Unspecified ectopic pregnancy without intrauterine pregnancy: Secondary | ICD-10-CM | POA: Diagnosis present

## 2022-02-09 DIAGNOSIS — O00101 Right tubal pregnancy without intrauterine pregnancy: Secondary | ICD-10-CM | POA: Insufficient documentation

## 2022-02-09 DIAGNOSIS — N736 Female pelvic peritoneal adhesions (postinfective): Secondary | ICD-10-CM | POA: Insufficient documentation

## 2022-02-09 DIAGNOSIS — D259 Leiomyoma of uterus, unspecified: Secondary | ICD-10-CM | POA: Insufficient documentation

## 2022-02-09 DIAGNOSIS — Z9851 Tubal ligation status: Secondary | ICD-10-CM | POA: Insufficient documentation

## 2022-02-09 DIAGNOSIS — Z3A01 Less than 8 weeks gestation of pregnancy: Secondary | ICD-10-CM

## 2022-02-09 HISTORY — DX: Leiomyoma of uterus, unspecified: D25.9

## 2022-02-09 HISTORY — PX: LAPAROSCOPIC BILATERAL SALPINGECTOMY: SHX5889

## 2022-02-09 LAB — COMPREHENSIVE METABOLIC PANEL
ALT: 11 U/L (ref 0–44)
AST: 20 U/L (ref 15–41)
Albumin: 3.8 g/dL (ref 3.5–5.0)
Alkaline Phosphatase: 60 U/L (ref 38–126)
Anion gap: 5 (ref 5–15)
BUN: 7 mg/dL (ref 6–20)
CO2: 22 mmol/L (ref 22–32)
Calcium: 8.7 mg/dL — ABNORMAL LOW (ref 8.9–10.3)
Chloride: 108 mmol/L (ref 98–111)
Creatinine, Ser: 0.58 mg/dL (ref 0.44–1.00)
GFR, Estimated: >60 mL/min (ref >60)
Glucose, Bld: 137 mg/dL — ABNORMAL HIGH (ref 70–99)
Potassium: 3.2 mmol/L — ABNORMAL LOW (ref 3.5–5.1)
Sodium: 135 mmol/L (ref 135–145)
Total Bilirubin: 0.4 mg/dL (ref 0.3–1.2)
Total Protein: 7.4 g/dL (ref 6.5–8.1)

## 2022-02-09 LAB — URINALYSIS, ROUTINE W REFLEX MICROSCOPIC
Bilirubin Urine: NEGATIVE
Glucose, UA: NEGATIVE mg/dL
Ketones, ur: NEGATIVE mg/dL
Nitrite: NEGATIVE
Protein, ur: NEGATIVE mg/dL
Specific Gravity, Urine: 1.023 (ref 1.005–1.030)
pH: 5 (ref 5.0–8.0)

## 2022-02-09 LAB — CBC WITH DIFFERENTIAL/PLATELET
Abs Immature Granulocytes: 0.06 10*3/uL (ref 0.00–0.07)
Basophils Absolute: 0 10*3/uL (ref 0.0–0.1)
Basophils Relative: 0 %
Eosinophils Absolute: 0 10*3/uL (ref 0.0–0.5)
Eosinophils Relative: 0 %
HCT: 29.6 % — ABNORMAL LOW (ref 36.0–46.0)
Hemoglobin: 8.3 g/dL — ABNORMAL LOW (ref 12.0–15.0)
Immature Granulocytes: 0 %
Lymphocytes Relative: 15 %
Lymphs Abs: 2.2 10*3/uL (ref 0.7–4.0)
MCH: 18.9 pg — ABNORMAL LOW (ref 26.0–34.0)
MCHC: 28 g/dL — ABNORMAL LOW (ref 30.0–36.0)
MCV: 67.3 fL — ABNORMAL LOW (ref 80.0–100.0)
Monocytes Absolute: 0.4 10*3/uL (ref 0.1–1.0)
Monocytes Relative: 3 %
Neutro Abs: 11.7 10*3/uL — ABNORMAL HIGH (ref 1.7–7.7)
Neutrophils Relative %: 82 %
Platelets: 264 10*3/uL (ref 150–400)
RBC: 4.4 MIL/uL (ref 3.87–5.11)
RDW: 22.4 % — ABNORMAL HIGH (ref 11.5–15.5)
WBC: 14.4 10*3/uL — ABNORMAL HIGH (ref 4.0–10.5)
nRBC: 0 % (ref 0.0–0.2)

## 2022-02-09 LAB — BETA HCG QUANT (REF LAB): hCG Quant: 2606 m[IU]/mL

## 2022-02-09 LAB — HCG, QUANTITATIVE, PREGNANCY: hCG, Beta Chain, Quant, S: 5363 m[IU]/mL — ABNORMAL HIGH (ref <5)

## 2022-02-09 LAB — ABO/RH: ABO/RH(D): B POS

## 2022-02-09 LAB — POC URINE PREG, ED: Preg Test, Ur: POSITIVE — AB

## 2022-02-09 SURGERY — SALPINGECTOMY, BILATERAL, LAPAROSCOPIC
Anesthesia: General | Laterality: Bilateral

## 2022-02-09 MED ORDER — METHOTREXATE FOR ECTOPIC PREGNANCY
50.0000 mg/m2 | Freq: Once | INTRAMUSCULAR | Status: DC
  Filled 2022-02-09: qty 3.2

## 2022-02-09 MED ORDER — FENTANYL CITRATE (PF) 100 MCG/2ML IJ SOLN
INTRAMUSCULAR | Status: AC
  Filled 2022-02-09: qty 2

## 2022-02-09 MED ORDER — DEXAMETHASONE SODIUM PHOSPHATE 10 MG/ML IJ SOLN
INTRAMUSCULAR | Status: AC
  Filled 2022-02-09: qty 1

## 2022-02-09 MED ORDER — KETOROLAC TROMETHAMINE 30 MG/ML IJ SOLN
INTRAMUSCULAR | Status: DC | PRN
  Administered 2022-02-09: 30 mg via INTRAVENOUS

## 2022-02-09 MED ORDER — ACETAMINOPHEN 500 MG PO TABS: 1000.0000 mg | ORAL_TABLET | Freq: Four times a day (QID) | ORAL | 0 refills | Status: DC | PRN

## 2022-02-09 MED ORDER — LACTATED RINGERS IV SOLN: INTRAVENOUS | Status: DC

## 2022-02-09 MED ORDER — ROCURONIUM BROMIDE 100 MG/10ML IV SOLN
INTRAVENOUS | Status: DC | PRN
  Administered 2022-02-09: 40 mg via INTRAVENOUS

## 2022-02-09 MED ORDER — GABAPENTIN 300 MG PO CAPS
300.0000 mg | ORAL_CAPSULE | ORAL | Status: AC
  Administered 2022-02-09: 300 mg via ORAL

## 2022-02-09 MED ORDER — ROCURONIUM BROMIDE 10 MG/ML (PF) SYRINGE
PREFILLED_SYRINGE | INTRAVENOUS | Status: AC
  Filled 2022-02-09: qty 10

## 2022-02-09 MED ORDER — OXYCODONE HCL 5 MG PO TABS
10.0000 mg | ORAL_TABLET | Freq: Once | ORAL | Status: AC
  Administered 2022-02-09: 10 mg via ORAL

## 2022-02-09 MED ORDER — SUGAMMADEX SODIUM 200 MG/2ML IV SOLN
INTRAVENOUS | Status: DC | PRN
  Administered 2022-02-09: 150 mg via INTRAVENOUS

## 2022-02-09 MED ORDER — BUPIVACAINE HCL (PF) 0.5 % IJ SOLN
INTRAMUSCULAR | Status: AC
  Filled 2022-02-09: qty 30

## 2022-02-09 MED ORDER — GABAPENTIN 300 MG PO CAPS
ORAL_CAPSULE | ORAL | Status: AC
  Filled 2022-02-09: qty 1

## 2022-02-09 MED ORDER — LACTATED RINGERS IV SOLN: INTRAVENOUS | Status: DC | PRN

## 2022-02-09 MED ORDER — ACETAMINOPHEN 10 MG/ML IV SOLN
INTRAVENOUS | Status: DC | PRN
  Administered 2022-02-09: 1000 mg via INTRAVENOUS

## 2022-02-09 MED ORDER — MEPERIDINE HCL 25 MG/ML IJ SOLN
INTRAMUSCULAR | Status: AC
  Filled 2022-02-09: qty 1

## 2022-02-09 MED ORDER — DOCUSATE SODIUM 100 MG PO CAPS: 100.0000 mg | ORAL_CAPSULE | Freq: Two times a day (BID) | ORAL | 0 refills | Status: DC | PRN

## 2022-02-09 MED ORDER — MEPERIDINE HCL 25 MG/ML IJ SOLN
6.2500 mg | INTRAMUSCULAR | Status: DC | PRN
  Administered 2022-02-09: 12.5 mg via INTRAVENOUS

## 2022-02-09 MED ORDER — ACETAMINOPHEN 10 MG/ML IV SOLN
INTRAVENOUS | Status: AC
  Filled 2022-02-09: qty 100

## 2022-02-09 MED ORDER — FENTANYL CITRATE (PF) 100 MCG/2ML IJ SOLN
25.0000 ug | INTRAMUSCULAR | Status: DC | PRN
  Administered 2022-02-09: 50 ug via INTRAVENOUS

## 2022-02-09 MED ORDER — PROPOFOL 10 MG/ML IV BOLUS
INTRAVENOUS | Status: DC | PRN
  Administered 2022-02-09: 130 mg via INTRAVENOUS

## 2022-02-09 MED ORDER — DEXAMETHASONE SODIUM PHOSPHATE 10 MG/ML IJ SOLN
INTRAMUSCULAR | Status: DC | PRN
  Administered 2022-02-09: 10 mg via INTRAVENOUS

## 2022-02-09 MED ORDER — OXYCODONE HCL 5 MG PO TABS
ORAL_TABLET | ORAL | Status: AC
  Filled 2022-02-09: qty 2

## 2022-02-09 MED ORDER — ACETAMINOPHEN 500 MG PO TABS: 1000.0000 mg | ORAL_TABLET | Freq: Four times a day (QID) | ORAL | Status: DC | PRN

## 2022-02-09 MED ORDER — DROPERIDOL 2.5 MG/ML IJ SOLN: 0.6250 mg | Freq: Once | INTRAMUSCULAR | Status: DC | PRN

## 2022-02-09 MED ORDER — IBUPROFEN 600 MG PO TABS: 600.0000 mg | ORAL_TABLET | Freq: Four times a day (QID) | ORAL | 1 refills | Status: DC | PRN

## 2022-02-09 MED ORDER — MIDAZOLAM HCL 2 MG/2ML IJ SOLN
INTRAMUSCULAR | Status: AC
  Filled 2022-02-09: qty 2

## 2022-02-09 MED ORDER — ONDANSETRON HCL 4 MG/2ML IJ SOLN
INTRAMUSCULAR | Status: DC | PRN
  Administered 2022-02-09: 4 mg via INTRAVENOUS

## 2022-02-09 MED ORDER — DEXMEDETOMIDINE HCL IN NACL 200 MCG/50ML IV SOLN
INTRAVENOUS | Status: DC | PRN
  Administered 2022-02-09: 8 ug via INTRAVENOUS
  Administered 2022-02-09: 4 ug via INTRAVENOUS

## 2022-02-09 MED ORDER — LIDOCAINE HCL (CARDIAC) PF 100 MG/5ML IV SOSY
PREFILLED_SYRINGE | INTRAVENOUS | Status: DC | PRN
  Administered 2022-02-09: 60 mg via INTRAVENOUS

## 2022-02-09 MED ORDER — OXYCODONE HCL 5 MG PO TABS: 5.0000 mg | ORAL_TABLET | Freq: Four times a day (QID) | ORAL | 0 refills | Status: DC | PRN

## 2022-02-09 MED ORDER — ONDANSETRON HCL 4 MG/2ML IJ SOLN
INTRAMUSCULAR | Status: AC
  Filled 2022-02-09: qty 2

## 2022-02-09 MED ORDER — PROPOFOL 10 MG/ML IV BOLUS
INTRAVENOUS | Status: AC
  Filled 2022-02-09: qty 20

## 2022-02-09 MED ORDER — CELECOXIB 200 MG PO CAPS: 400.0000 mg | ORAL_CAPSULE | ORAL | Status: DC

## 2022-02-09 MED ORDER — BUPIVACAINE HCL 0.5 % IJ SOLN
INTRAMUSCULAR | Status: DC | PRN
  Administered 2022-02-09: 17 mL

## 2022-02-09 MED ORDER — ACETAMINOPHEN 500 MG PO TABS: 1000.0000 mg | ORAL_TABLET | ORAL | Status: DC

## 2022-02-09 MED ORDER — FENTANYL CITRATE (PF) 100 MCG/2ML IJ SOLN
INTRAMUSCULAR | Status: DC | PRN
  Administered 2022-02-09 (×3): 50 ug via INTRAVENOUS

## 2022-02-09 MED ORDER — MIDAZOLAM HCL 2 MG/2ML IJ SOLN
INTRAMUSCULAR | Status: DC | PRN
  Administered 2022-02-09: 2 mg via INTRAVENOUS

## 2022-02-09 MED ORDER — ONDANSETRON HCL 4 MG/2ML IJ SOLN: 4.0000 mg | Freq: Once | INTRAMUSCULAR | Status: DC | PRN

## 2022-02-09 MED ORDER — HYDROCODONE-ACETAMINOPHEN 7.5-325 MG PO TABS
1.0000 | ORAL_TABLET | Freq: Once | ORAL | Status: DC | PRN
  Filled 2022-02-09: qty 1

## 2022-02-09 MED ORDER — ACETAMINOPHEN 500 MG PO TABS
ORAL_TABLET | ORAL | Status: AC
  Filled 2022-02-09: qty 2

## 2022-02-09 SURGICAL SUPPLY — 41 items
BLADE SURG SZ11 CARB STEEL (BLADE) ×2 IMPLANT
CATH ROBINSON RED A/P 16FR (CATHETERS) ×2 IMPLANT
CHLORAPREP W/TINT 26 (MISCELLANEOUS) ×2 IMPLANT
CORD MONOPOLAR M/FML 12FT (MISCELLANEOUS) IMPLANT
DERMABOND ADVANCED .7 DNX12 (GAUZE/BANDAGES/DRESSINGS) ×2 IMPLANT
GAUZE 4X4 16PLY ~~LOC~~+RFID DBL (SPONGE) ×4 IMPLANT
GLOVE BIO SURGEON STRL SZ 6.5 (GLOVE) ×2 IMPLANT
GLOVE PI ORTHO PRO STRL 7.5 (GLOVE) ×2 IMPLANT
GLOVE SURG UNDER LTX SZ7 (GLOVE) ×2 IMPLANT
GOWN STRL REUS W/ TWL LRG LVL3 (GOWN DISPOSABLE) ×6 IMPLANT
GOWN STRL REUS W/TWL LRG LVL3 (GOWN DISPOSABLE) ×6
GOWN STRL REUS W/TWL XL LVL4 (GOWN DISPOSABLE) ×2 IMPLANT
GRASPER SUT TROCAR 14GX15 (MISCELLANEOUS) IMPLANT
IRRIGATION STRYKERFLOW (MISCELLANEOUS) ×2 IMPLANT
IRRIGATOR STRYKERFLOW (MISCELLANEOUS) ×2
IV LACTATED RINGERS 1000ML (IV SOLUTION) ×2 IMPLANT
KIT PINK PAD W/HEAD ARE REST (MISCELLANEOUS) ×2 IMPLANT
KIT PINK PAD W/HEAD ARM REST (MISCELLANEOUS) ×2 IMPLANT
KIT TURNOVER CYSTO (KITS) ×2 IMPLANT
MANIFOLD NEPTUNE II (INSTRUMENTS) ×2 IMPLANT
NS IRRIG 500ML POUR BTL (IV SOLUTION) ×2 IMPLANT
PACK GYN LAPAROSCOPIC (MISCELLANEOUS) ×2 IMPLANT
PAD OB MATERNITY 4.3X12.25 (PERSONAL CARE ITEMS) ×2 IMPLANT
PAD PREP 24X41 OB/GYN DISP (PERSONAL CARE ITEMS) ×2 IMPLANT
POUCH ENDO CATCH 10MM SPEC (MISCELLANEOUS) IMPLANT
SCISSORS METZENBAUM CVD 33 (INSTRUMENTS) IMPLANT
SCRUB CHG 4% DYNA-HEX 4OZ (MISCELLANEOUS) ×2 IMPLANT
SET TUBE SMOKE EVAC HIGH FLOW (TUBING) ×2 IMPLANT
SHEARS HARMONIC ACE PLUS 36CM (ENDOMECHANICALS) ×1 IMPLANT
SLEEVE Z-THREAD 5X100MM (TROCAR) ×2 IMPLANT
SUT VIC AB 3-0 SH 27 (SUTURE)
SUT VIC AB 3-0 SH 27X BRD (SUTURE) IMPLANT
SUT VIC AB 4-0 FS2 27 (SUTURE) ×2 IMPLANT
SUT VICRYL 0 UR6 27IN ABS (SUTURE) ×2 IMPLANT
SYS BAG RETRIEVAL 10MM (BASKET) ×2
SYSTEM BAG RETRIEVAL 10MM (BASKET) ×1 IMPLANT
TRAP FLUID SMOKE EVACUATOR (MISCELLANEOUS) ×2 IMPLANT
TROCAR XCEL NON-BLD 5MMX100MML (ENDOMECHANICALS) ×2 IMPLANT
TROCAR XCEL UNIV SLVE 11M 100M (ENDOMECHANICALS) ×1 IMPLANT
TROCAR Z-THRD FIOS HNDL 11X100 (TROCAR) ×2 IMPLANT
WATER STERILE IRR 500ML POUR (IV SOLUTION) ×2 IMPLANT

## 2022-02-09 NOTE — Anesthesia Procedure Notes (Signed)
Procedure Name: Intubation Date/Time: 02/09/2022 8:12 PM  Performed by: Jonna Clark, CRNAPre-anesthesia Checklist: Patient identified, Patient being monitored, Timeout performed, Emergency Drugs available and Suction available Patient Re-evaluated:Patient Re-evaluated prior to induction Oxygen Delivery Method: Circle system utilized Preoxygenation: Pre-oxygenation with 100% oxygen Induction Type: IV induction and Rapid sequence Ventilation: Mask ventilation without difficulty Laryngoscope Size: 3 and McGraph Grade View: Grade I Tube type: Oral Tube size: 6.5 mm Number of attempts: 1 Placement Confirmation: ETT inserted through vocal cords under direct vision, positive ETCO2 and breath sounds checked- equal and bilateral Secured at: 21 cm Tube secured with: Tape Dental Injury: Teeth and Oropharynx as per pre-operative assessment

## 2022-02-09 NOTE — Discharge Instructions (Signed)

## 2022-02-09 NOTE — Consult Note (Addendum)
Reason for Consult: Ectopic Pregnancy Referring Physician: Delman Kitten, MD (ER Physician)   HPI:  Penny Gomez is an 36 y.o. [phone] female with ectopic pregnancy who initially presented to the ER for Methotrexate therapy for suspected ectopic pregnancy. She has a history of tubal ligation over 10 years ago. Patient has been followed outpatient with serial BHCG levels, initially trending downward, however most recent lab showed upward trend (although delay in reporting last lab of 12/26 from [facility] until late last night). Patient was notified earlier today of of results and instructed to report to the Emergency Room. Still noting some mild spotting, but now also noting some LLQ pain, not previously present.   Pertinent Gynecological History: Menses: regular every month without intermenstrual spotting Bleeding: intermenstrual bleeding in November.  Contraception: tubal ligation DES exposure: denies Blood transfusions: none Sexually transmitted diseases: no past history Last pap: normal Date: 03/31/2020    OB History  Gravida Para Term Preterm AB Living  '2 2 2     2  '$ SAB IAB Ectopic Multiple Live Births          2    # Outcome Date GA Lbr Len/2nd Weight Sex Delivery Anes PTL Lv  2 Term 2013     CS-LTranv     1 Term 2008     CS-LTranv        Menstrual History: Menarche age: 31 Patient's last menstrual period was 12/19/2021 (approximate).    Past Medical History:  Diagnosis Date   Breast lump    Fibroids     Past Surgical History:  Procedure Laterality Date   BREAST BIOPSY Right 05/20/2019   Affirm Bx- X- clip, path pending   CESAREAN SECTION     TUBAL LIGATION      Family History  Problem Relation Age of Onset   Breast cancer Mother 48   Breast cancer Maternal Aunt 34   Breast cancer Cousin 67    Social History:  reports that she has never smoked. She has never used smokeless tobacco. She reports current alcohol use. She reports that she does not use  drugs.  Allergies: No Known Allergies  Medications: Prior to Admission: (Not in a hospital admission)   Review of Systems  Constitutional:  Negative for appetite change, fatigue and fever.  HENT: Negative.    Eyes: Negative.   Respiratory: Negative.    Cardiovascular: Negative.   Gastrointestinal: Negative.   Endocrine: Negative.   Genitourinary:  Positive for pelvic pain and vaginal bleeding. Negative for dysuria, urgency and vaginal discharge.       LLQ pain  Musculoskeletal: Negative.   Skin: Negative.   Neurological: Negative.   Psychiatric/Behavioral: Negative.      Blood pressure 119/74, pulse 99, temperature 98.2 F (36.8 C), temperature source Oral, resp. rate 16, height 5' (1.524 m), weight 59.9 kg, last menstrual period 12/19/2021, SpO2 100 %. Physical Exam Constitutional:      Appearance: Normal appearance. She is normal weight.  HENT:     Head: Normocephalic.     Nose: Nose normal.     Mouth/Throat:     Mouth: Mucous membranes are dry.  Eyes:     Extraocular Movements: Extraocular movements intact.     Pupils: Pupils are equal, round, and reactive to light.  Cardiovascular:     Rate and Rhythm: Normal rate and regular rhythm.     Pulses: Normal pulses.     Heart sounds: Normal heart sounds.  Pulmonary:     Effort: Pulmonary  effort is normal.     Breath sounds: Normal breath sounds.  Abdominal:     General: Abdomen is flat. There is no distension.     Palpations: Abdomen is soft.     Tenderness: There is abdominal tenderness. There is no guarding or rebound.  Genitourinary:    General: Normal vulva.     Rectum: Normal.  Musculoskeletal:        General: Normal range of motion.     Cervical back: Normal range of motion and neck supple.  Skin:    General: Skin is warm and dry.  Neurological:     General: No focal deficit present.     Mental Status: She is alert and oriented to person, place, and time.  Psychiatric:        Mood and Affect: Mood normal.         Behavior: Behavior normal.     Labs:  Lab Results  Component Value Date   WBC 14.4 (H) 02/09/2022   HGB 8.3 (L) 02/09/2022   HCT 29.6 (L) 02/09/2022   MCV 67.3 (L) 02/09/2022   PLT 264 02/09/2022    CMP     Latest Ref Rng & Units 02/09/2022    2:58 PM  CMP  Glucose 70 - 99 mg/dL 137   BUN 6 - 20 mg/dL 7   Creatinine 0.44 - 1.00 mg/dL 0.58   Sodium 135 - 145 mmol/L 135   Potassium 3.5 - 5.1 mmol/L 3.2   Chloride 98 - 111 mmol/L 108   CO2 22 - 32 mmol/L 22   Calcium 8.9 - 10.3 mg/dL 8.7   Total Protein 6.5 - 8.1 g/dL 7.4   Total Bilirubin 0.3 - 1.2 mg/dL 0.4   Alkaline Phos 38 - 126 U/L 60   AST 15 - 41 U/L 20   ALT 0 - 44 U/L 11       Latest Reference Range & Units 01/27/22 19:30 01/30/22 15:00 02/05/22 10:42 02/09/22 14:58  hCG Quant mIU/mL  1,824 2,606   HCG, Beta Chain, Quant, S <5 mIU/mL 2,724 (H)   5,363 (H)  (H): Data is abnormally high    Lab Results  Component Value Date   [facility]  02/09/2022    B POS Performed at [facility], [address], [place], [place] [address]    Imaging:  [place] Transvaginal  Result Date: 02/09/2022 CLINICAL DATA:  History of tubal ligation 12 years ago. Positive urine pregnancy test leading to prior 01/27/2022 early OB ultrasound not demonstrating an intrauterine pregnancy. Possible ectopic pregnancy. Beta HCG 5263 today, 2606 on 02/05/2022, 1824 on 01/30/2022, and 2724 on 01/27/2022. Last menstrual period 12/19/2021, corresponding to 7 weeks 3 days estimated gestational age. EXAM: OBSTETRIC <14 WK [place] AND TRANSVAGINAL OB US TECHNIQUE: Both transabdominal and transvaginal ultrasound examinations were performed for complete evaluation of the gestation as well as the maternal uterus, adnexal regions, and pelvic cul-de-sac. Transvaginal technique was performed to assess early pregnancy. COMPARISON:  Early OB ultrasound 01/27/2022. FINDINGS: Intrauterine gestational sac: None Yolk sac:  Not Visualized. Embryo:  Not  Visualized. Cardiac Activity: Not Visualized. Heart Rate: Not applicable Subchorionic hemorrhage:  None visualized. Maternal uterus/adnexae: A corpus luteum cyst is again seen within the right ovary. Adjacent to the right ovary there is a 2.6 x 2.3 x 2.4 cm thick-walled peripherally echogenic and centrally anechoic mass with increased peripheral color flow vascularity. The endometrial stripe measures up to approximately 12 mm. No intrauterine pregnancy is visualized. Numerous uterine  fibroids are again seen. The largest measured on the current study measures up to 5.7 cm within the uterine fundus. IMPRESSION: Adjacent to the right ovary there is a newly visualized peripherally thick walled and centrally anechoic structure suspicious for an ectopic pregnancy. Critical Value/emergent results were called by telephone at the time of interpretation on 02/09/2022 at 6:14 pm to provider Ashok Cordia, PA-C in the ER, who verbally acknowledged these results. Electronically Signed   By: Yvonne Kendall M.D.   On: 02/09/2022 18:17   US OB Comp < 14 Wks  Result Date: 02/09/2022 CLINICAL DATA:  History of tubal ligation 12 years ago. Positive urine pregnancy test leading to prior 01/27/2022 early OB ultrasound not demonstrating an intrauterine pregnancy. Possible ectopic pregnancy. Beta HCG 5263 today, 2606 on 02/05/2022, 1824 on 01/30/2022, and 2724 on 01/27/2022. Last menstrual period 12/19/2021, corresponding to 7 weeks 3 days estimated gestational age. EXAM: OBSTETRIC <14 WK [place] AND TRANSVAGINAL OB US TECHNIQUE: Both transabdominal and transvaginal ultrasound examinations were performed for complete evaluation of the gestation as well as the maternal uterus, adnexal regions, and pelvic cul-de-sac. Transvaginal technique was performed to assess early pregnancy. COMPARISON:  Early OB ultrasound 01/27/2022. FINDINGS: Intrauterine gestational sac: None Yolk sac:  Not Visualized. Embryo:  Not Visualized. Cardiac Activity: Not  Visualized. Heart Rate: Not applicable Subchorionic hemorrhage:  None visualized. Maternal uterus/adnexae: A corpus luteum cyst is again seen within the right ovary. Adjacent to the right ovary there is a 2.6 x 2.3 x 2.4 cm thick-walled peripherally echogenic and centrally anechoic mass with increased peripheral color flow vascularity. The endometrial stripe measures up to approximately 12 mm. No intrauterine pregnancy is visualized. Numerous uterine fibroids are again seen. The largest measured on the current study measures up to 5.7 cm within the uterine fundus. IMPRESSION: Adjacent to the right ovary there is a newly visualized peripherally thick walled and centrally anechoic structure suspicious for an ectopic pregnancy. Critical Value/emergent results were called by telephone at the time of interpretation on 02/09/2022 at 6:14 pm to provider Ashok Cordia, PA-C in the ER, who verbally acknowledged these results. Electronically Signed   By: Yvonne Kendall M.D.   On: 02/09/2022 18:17    Assessment/Plan: 36 y.o. [id number] with ectopic pregnancy, now identified on right side (previously not seen 2 weeks ago).   On exam, she had stable vital signs, and no evidence of rupture  . Patient was previously counseled on Methotrexate therapy, however now desires surgical intervention with bilateral salpingectomy.  Risks of surgery including bleeding which may require transfusion or reoperation, infection, injury to bowel or other surrounding organs, need for additional procedures including (laparoscopy or) laparotomy were explained to patient and written informed consent was obtained.  Patient has been NPO since 1:00 pm and she will remain NPO for procedure. Anesthesia and OR aware.  Preoperative prophylactic antibiotics and SCDs ordered on call to the OR.  To OR when ready.   [name], MD [facility] 02/09/2022, 7:20 PM

## 2022-02-09 NOTE — ED Triage Notes (Signed)
Pt here with abnormal labs. Pt was recently told she was pregnant and now has discovered that her pregnancy is in her fallopian tubes. Pt states her hcg levels have been elevated as well. Pt has been spotting over 2 weeks. Pt denies pain.

## 2022-02-09 NOTE — Anesthesia Preprocedure Evaluation (Signed)
Anesthesia Evaluation  Patient identified by MRN, date of birth, ID band Patient awake    Reviewed: Allergy & Precautions, NPO status , Patient's Chart, lab work & pertinent test results  History of Anesthesia Complications Negative for: history of anesthetic complications  Airway Mallampati: II  TM Distance: >3 FB Neck ROM: Full    Dental no notable dental hx.    Pulmonary neg sleep apnea, neg COPD, Patient abstained from smoking.Not current smoker   Pulmonary exam normal breath sounds clear to auscultation       Cardiovascular Exercise Tolerance: Good (-) hypertension(-) Past MI, (-) Cardiac Stents and (-) CHF Normal cardiovascular exam(-) dysrhythmias  Rhythm:Regular Rate:Normal     Neuro/Psych neg Seizures    GI/Hepatic negative GI ROS, Neg liver ROS,neg GERD  ,,  Endo/Other  negative endocrine ROSneg diabetes    Renal/GU negative Renal ROS     Musculoskeletal   Abdominal   Peds  Hematology negative hematology ROS (+)   Anesthesia Other Findings Past Medical History: No date: Breast lump No date: Fibroids   Reproductive/Obstetrics (+) Pregnancy Ectopic                             Anesthesia Physical Anesthesia Plan  ASA: 1 and emergent  Anesthesia Plan: General ETT   Post-op Pain Management: Ofirmev IV (intra-op)* and Dilaudid IV   Induction: Intravenous  PONV Risk Score and Plan:   Airway Management Planned: Oral ETT  Additional Equipment:   Intra-op Plan:   Post-operative Plan: Extubation in OR  Informed Consent: I have reviewed the patients History and Physical, chart, labs and discussed the procedure including the risks, benefits and alternatives for the proposed anesthesia with the patient or authorized representative who has indicated his/her understanding and acceptance.     Dental Advisory Given  Plan Discussed with: Anesthesiologist, CRNA and  Surgeon  Anesthesia Plan Comments: (RSI, routine monitors  Patient consented for risks of anesthesia including but not limited to:  - adverse reactions to medications - damage to eyes, teeth, lips or other oral mucosa - nerve damage due to positioning  - sore throat or hoarseness - Damage to heart, brain, nerves, lungs, other parts of body or loss of life  Patient voiced understanding.)       Anesthesia Quick Evaluation

## 2022-02-09 NOTE — ED Provider Notes (Signed)
[facility] Provider Note    Event Date/Time   First MD Initiated Contact with Patient 02/09/22 1803     (approximate)   History   Abnormal Labs   HPI  Penny Gomez is a 36 y.o. female been experiencing about 2 weeks of small amount of vaginal bleeding saw OB/GYN and was referred here for concerns of a possible "ectopic pregnancy"  Fevers no chills no pain.  Small amount of bleeding lighter than a period for about 2 weeks.  No vaginal discharge other than some scant amount of bleeding.  No pain or burning with urination    Per OB note yesterday":  Patient is a 36 y.o. G79P2002 female who presents for evaluation for possible miscarriage vs pregnancy of unknown location. She has a past history tubal ligation in 2012. She was evaluated at ED on 01/27/2022 with concerns for "feeling off". Patient states that she has noticed some light vaginal bleeding that she has noticed in her underwear and states that she does not anticipate her menstrual cycle for another 2 weeks. Urine HCG was performed and it resulted as positive. HCG quantitative test was done and the levels were 2,724. Ultrasound was done and the results revealed: Pregnancy of unknown location. Recommend serial hCG measurements. Beta HCG done on 01/30/2022 and levels were 1824. She has levels checked again on 02/05/2022, results still pending. "    Physical Exam   Triage Vital Signs: ED Triage Vitals  Enc Vitals Group     BP 02/09/22 1457 127/75     Pulse Rate 02/09/22 1457 (!) 101     Resp 02/09/22 1457 18     Temp 02/09/22 1458 98.8 F (37.1 C)     Temp Source 02/09/22 1458 Oral     SpO2 02/09/22 1457 100 %     Weight 02/09/22 1457 132 lb 0.9 oz (59.9 kg)     Height 02/09/22 1457 5' (1.524 m)     Head Circumference --      Peak Flow --      Pain Score 02/09/22 1457 0     Pain Loc --      Pain Edu? --      Excl. in [place]? --     Most recent vital signs: Vitals:   02/09/22 1458 02/09/22 1706   BP:  119/74  Pulse:  99  Resp:  16  Temp: 98.8 F (37.1 C) 98.2 F (36.8 C)  SpO2:  100%     General: Awake, no distress.  Seated, moves to bed for examination easily and comfortably.  Denies any pain or distress CV:  Good peripheral perfusion.  Resp:  Normal effort.  Abd:  No distention.  Abdomen soft nontender nondistended throughout. Other:     ED Results / Procedures / Treatments   Labs (all labs ordered are listed, but only abnormal results are displayed) Labs Reviewed  URINALYSIS, ROUTINE W REFLEX MICROSCOPIC - Abnormal; Notable for the following components:      Result Value   Color, Urine YELLOW (*)    APPearance HAZY (*)    Hgb urine dipstick LARGE (*)    Leukocytes,Ua TRACE (*)    Bacteria, UA RARE (*)    All other components within normal limits  HCG, QUANTITATIVE, PREGNANCY - Abnormal; Notable for the following components:   hCG, Beta Chain, Quant, S 5,363 (*)    All other components within normal limits  COMPREHENSIVE METABOLIC PANEL - Abnormal; Notable for the following components:  Potassium 3.2 (*)    Glucose, Bld 137 (*)    Calcium 8.7 (*)    All other components within normal limits  CBC WITH DIFFERENTIAL/PLATELET - Abnormal; Notable for the following components:   WBC 14.4 (*)    Hemoglobin 8.3 (*)    HCT 29.6 (*)    MCV 67.3 (*)    MCH 18.9 (*)    MCHC 28.0 (*)    RDW 22.4 (*)    Neutro Abs 11.7 (*)    All other components within normal limits  POC URINE PREG, ED - Abnormal; Notable for the following components:   Preg Test, Ur POSITIVE (*)    All other components within normal limits  ABO/RH   Labs reveal Rh status positive  Positive pregnancy test.  hCG 5363 uptrending compared to 13 days ago  Hemoglobin is stable at 8, white count is elevated 14,000  EKG     RADIOLOGY  US OB Transvaginal  Result Date: 02/09/2022 CLINICAL DATA:  History of tubal ligation 12 years ago. Positive urine pregnancy test leading to prior  01/27/2022 early OB ultrasound not demonstrating an intrauterine pregnancy. Possible ectopic pregnancy. Beta HCG 5263 today, 2606 on 02/05/2022, 1824 on 01/30/2022, and 2724 on 01/27/2022. Last menstrual period 12/19/2021, corresponding to 7 weeks 3 days estimated gestational age. EXAM: OBSTETRIC <14 WK [place] AND TRANSVAGINAL OB US TECHNIQUE: Both transabdominal and transvaginal ultrasound examinations were performed for complete evaluation of the gestation as well as the maternal uterus, adnexal regions, and pelvic cul-de-sac. Transvaginal technique was performed to assess early pregnancy. COMPARISON:  Early OB ultrasound 01/27/2022. FINDINGS: Intrauterine gestational sac: None Yolk sac:  Not Visualized. Embryo:  Not Visualized. Cardiac Activity: Not Visualized. Heart Rate: Not applicable Subchorionic hemorrhage:  None visualized. Maternal uterus/adnexae: A corpus luteum cyst is again seen within the right ovary. Adjacent to the right ovary there is a 2.6 x 2.3 x 2.4 cm thick-walled peripherally echogenic and centrally anechoic mass with increased peripheral color flow vascularity. The endometrial stripe measures up to approximately 12 mm. No intrauterine pregnancy is visualized. Numerous uterine fibroids are again seen. The largest measured on the current study measures up to 5.7 cm within the uterine fundus. IMPRESSION: Adjacent to the right ovary there is a newly visualized peripherally thick walled and centrally anechoic structure suspicious for an ectopic pregnancy. Critical Value/emergent results were called by telephone at the time of interpretation on 02/09/2022 at 6:14 pm to provider Ashok Cordia, PA-C in the ER, who verbally acknowledged these results. Electronically Signed   By: Yvonne Kendall M.D.   On: 02/09/2022 18:17   US OB Comp < 14 Wks  Result Date: 02/09/2022 CLINICAL DATA:  History of tubal ligation 12 years ago. Positive urine pregnancy test leading to prior 01/27/2022 early OB ultrasound not  demonstrating an intrauterine pregnancy. Possible ectopic pregnancy. Beta HCG 5263 today, 2606 on 02/05/2022, 1824 on 01/30/2022, and 2724 on 01/27/2022. Last menstrual period 12/19/2021, corresponding to 7 weeks 3 days estimated gestational age. EXAM: OBSTETRIC <14 WK [place] AND TRANSVAGINAL OB US TECHNIQUE: Both transabdominal and transvaginal ultrasound examinations were performed for complete evaluation of the gestation as well as the maternal uterus, adnexal regions, and pelvic cul-de-sac. Transvaginal technique was performed to assess early pregnancy. COMPARISON:  Early OB ultrasound 01/27/2022. FINDINGS: Intrauterine gestational sac: None Yolk sac:  Not Visualized. Embryo:  Not Visualized. Cardiac Activity: Not Visualized. Heart Rate: Not applicable Subchorionic hemorrhage:  None visualized. Maternal uterus/adnexae: A corpus luteum cyst is again seen within  the right ovary. Adjacent to the right ovary there is a 2.6 x 2.3 x 2.4 cm thick-walled peripherally echogenic and centrally anechoic mass with increased peripheral color flow vascularity. The endometrial stripe measures up to approximately 12 mm. No intrauterine pregnancy is visualized. Numerous uterine fibroids are again seen. The largest measured on the current study measures up to 5.7 cm within the uterine fundus. IMPRESSION: Adjacent to the right ovary there is a newly visualized peripherally thick walled and centrally anechoic structure suspicious for an ectopic pregnancy. Critical Value/emergent results were called by telephone at the time of interpretation on 02/09/2022 at 6:14 pm to provider Ashok Cordia, PA-C in the ER, who verbally acknowledged these results. Electronically Signed   By: Yvonne Kendall M.D.   On: 02/09/2022 18:17      Discussed results of ultrasound imaging with OB/GYN consultant team   PROCEDURES:  Critical Care performed: No  Procedures   MEDICATIONS ORDERED IN ED: Medications  methotrexate (for ectopic pregnancy) 25  mg/mL chemo injection (has no administration in time range)     IMPRESSION / MDM / ASSESSMENT AND PLAN / ED COURSE  I reviewed the triage vital signs and the nursing notes.                              Differential diagnosis includes, but is not limited to, possible ectopic pregnancy, molar pregnancy, subchorionic hemorrhage, placenta previa, etc.  Based on ultrasound imaging clinical history and review of notes it is highly concerning for ectopic pregnancy.  ----------------------------------------- 6:31 PM on 02/09/2022 -----------------------------------------  Consult placed with OB/GYN, acknowledged by Dr. Marcelline Mates at this time.   Patient's presentation is most consistent with acute presentation with potential threat to life or bodily function.  ----------------------------------------- 7:01 PM on 02/09/2022 ----------------------------------------- OB/GYN team advised they will be admitting for further care and treatment.  FINAL CLINICAL IMPRESSION(S) / ED DIAGNOSES   Final diagnoses:  Ectopic pregnancy, unspecified location, unspecified whether intrauterine pregnancy present     Rx / DC Orders   ED Discharge Orders     None        Note:  This document was prepared using Dragon voice recognition software and may include unintentional dictation errors.   Delman Kitten, MD 02/09/22 1901

## 2022-02-09 NOTE — ED Notes (Addendum)
Pt to ED with family member for ongoing issues with elevated and fluctuating Hcg levels after seen here recently. Hcg is around 5,000 on labs today, pt states was around 2000 2 days ago. Pt has tenderness to palpation to LLQ and has had bilateral tubal ligation. Ultrasound done already.  Pt saw OB yesterday who said probably ectopic pregnancy according to Hcg levels but did not prescribe methotrexate.

## 2022-02-09 NOTE — Op Note (Signed)
Procedure(s): LAPAROSCOPIC BILATERAL SALPINGECTOMY, LYSIS OF ADHESIONS Procedure Note  Penny Gomez female 36 y.o. 02/09/2022  Indications: The patient is a 36 y.o. G1P2002 female with right ectopic pregnancy. Also with history of fibroid uterus and prior surgical history of C-section x 2 and tubal ligation.   Pre-operative Diagnosis: Right ectopic pregnancy, history of tubal ligation, fibroid uterus  Post-operative Diagnosis: Same, with pelvic adhesions  Surgeon: [name], MD  Assistants:  None.   Anesthesia: General endotracheal anesthesia  Findings: The uterus was sounded to 14 cm Uterus enlarged with multiple fibroids, appear subserosal and submucosal. Dense adhesion band of anterior surface of the uterus to anterior abdominal wall.  Filmy adhesions of lateral uterine surfaces to pelvic sidewalls bilaterally. Posterior cul-de-sac with few adhesions and peritoneal window on left.  Right fallopian tube with ectopic pregnancy in infundibular portion of fallopian tube. Right ovary with small simple cyst.  Left fallopian tube and ovary densely adherent to anterior and lateral surface of the uterus.   Procedure Details: The patient was seen in the Holding Room. The risks, benefits, complications, treatment options, and expected outcomes were discussed with the patient.  The patient concurred with the proposed plan, giving informed consent.  The site of surgery properly noted/marked. The patient was taken to the Operating Room, identified as Penny Gomez and the procedure verified as Procedure(s) (LRB): LAPAROSCOPIC BILATERAL SALPINGECTOMY (Bilateral). A Time Out was held and the above information confirmed.  She was then placed under general anesthesia without difficulty. She was placed in the dorsal lithotomy position, and was prepped and draped in a sterile manner.  A straight catheterization was performed. A sterile speculum was inserted into the vagina and the cervix was  grasped at the anterior lip using a single-toothed tenaculum.  The uterus was sounded to 14 cm, and a Hulka clamp was placed for uterine manipulation.  The speculum and tenaculum were then removed. After an adequate timeout was performed, attention was turned to the abdomen where an umbilical incision was made with the scalpel.  The Optiview 5-mm trocar and sleeve were then advanced without difficulty with the laparoscope under direct visualization into the abdomen.  The abdomen was then insufflated with carbon dioxide gas and adequate pneumoperitoneum was obtained. A 5-mm left lower quadrant port and an 11-mm right lower quadrant port were then placed under direct visualization.  A survey of the patient's pelvis and abdomen revealed the findings as above.     Attention was then turned to the right fallopian tube which was grasped and ligated from the underlying mesosalpinx and uterine attachment using the Harmonic instrument.  Good hemostasis was noted.  The small ovarian cyst on the right ovary was incised and allowed to drain, with small amount of serous fluid noted.  Attention was then turned to the left fallopian tube which was noted to be densely adherent to the anterior surface of the uterus.  The tube was dissected from the adhesions and from the ovarian ligament, however the fimbriated end was unable to be adequately identified.  The segment of tube that was able to be freed from adhesions was grasped and ligated from the underlying mesosalpinx and uterine attachment using the Harmonic instrument.  The specimen was placed in an EndoCatch bag and removed from the abdomen intact. The thick adhesion band noted at the anterior abdominal wall to the uterus was ligated down to the level of the lower uterine segment was performed using the Harmonic instrument.   The 11-mm trochar was  removed and a cone device was inserted. The PMI suture closure system was used to approximate the fascia. The abdomen was  desufflated, and all other instruments and trochars were removed.  All skin incisions were closed with 4-0 Monocryl and Dermabond.The incisions were injected with a total of 15 ml of 0.5% Sensorcaine.   The patient tolerated the procedure well.  Sponge, lap, and needle counts were correct times three.  The patient was then taken to the recovery room awake, extubated and in stable condition.   The patient will be discharged to home as per PACU criteria.  Routine postoperative instructions given.  She was prescribed Percocet, Ibuprofen and Colace.  She will follow up in the office in about 2 weeks for postoperative evaluation.   Estimated Blood Loss:  minimal      Drains: straight catheterization prior to procedure with 150 ml of clear urine         Total IV Fluids: 500  ml  Specimens:  Right fallopian tube with ectopic, segment of left fallopian tube.          Implants: None         Complications:  None; patient tolerated the procedure well.         Disposition: PACU - hemodynamically stable.         Condition: stable   [name], MD [facility]

## 2022-02-09 NOTE — ED Provider Triage Note (Signed)
Emergency Medicine Provider Triage Evaluation Note  Penny Gomez , a 36 y.o. female  was evaluated in triage.  Pt complains of elevated HCG, likely has ectopic pregnancy. Patient reports called by her OBGYN. No abdominal pain. Vaginal spotting x2 weeks. Had a tubal ligation.  Review of Systems  Positive: Vaginal spotting Negative: Abdominal pain  Physical Exam  LMP 12/19/2021 (Approximate)  Gen:   Awake, no distress   Resp:  Normal effort MSK:   Moves extremities without difficulty  Other:    Medical Decision Making  Medically screening exam initiated at 2:56 PM.  Appropriate orders placed.  BRITNIE COLV[place]LE was informed that the remainder of the evaluation will be completed by another provider, this initial triage assessment does not replace that evaluation, and the importance of remaining in the ED until their evaluation is complete.     Marquette Old, PA-C 02/09/22 1458

## 2022-02-09 NOTE — Transfer of Care (Signed)
Immediate Anesthesia Transfer of Care Note  Patient: Penny Gomez  Procedure(s) Performed: LAPAROSCOPIC BILATERAL SALPINGECTOMY (Bilateral)  Patient Location: PACU  Anesthesia Type:General  Level of Consciousness: drowsy and patient cooperative  Airway & Oxygen Therapy: Patient Spontanous Breathing and Patient connected to nasal cannula oxygen  Post-op Assessment: Report given to RN and Post -op Vital signs reviewed and stable  Post vital signs: Reviewed and stable  Last Vitals:  Vitals Value Taken Time  BP 104/75 02/09/22 2145  Temp 36.2 C 02/09/22 2141  Pulse 68 02/09/22 2145  Resp 11 02/09/22 2145  SpO2 100 % 02/09/22 2145  Vitals shown include unvalidated device data.  Last Pain:  Vitals:   02/09/22 2141  TempSrc:   PainSc: Asleep         Complications: No notable events documented.

## 2022-02-09 NOTE — Telephone Encounter (Signed)
Contacted patient about recent lab results.  Advised that is suspected that she has an ectopic pregnancy at this time. Discussed need for treatment with Methotrexate. Can report to MAU in [facility] or ER at [facility] for treatment.  Patient notes understanding.    [name], MD [facility] at [facility]

## 2022-02-10 ENCOUNTER — Encounter: Payer: Self-pay | Admitting: Obstetrics and Gynecology

## 2022-02-10 NOTE — Progress Notes (Signed)
This is your patient. Thank you

## 2022-02-10 NOTE — Anesthesia Postprocedure Evaluation (Signed)
Anesthesia Post Note  Patient: Penny Gomez  Procedure(s) Performed: LAPAROSCOPIC BILATERAL SALPINGECTOMY (Bilateral)  Patient location during evaluation: PACU Anesthesia Type: General Level of consciousness: awake and alert Pain management: pain level controlled Vital Signs Assessment: post-procedure vital signs reviewed and stable Respiratory status: spontaneous breathing, nonlabored ventilation, respiratory function stable and patient connected to nasal cannula oxygen Cardiovascular status: blood pressure returned to baseline and stable Postop Assessment: no apparent nausea or vomiting Anesthetic complications: no   No notable events documented.   Last Vitals:  Vitals:   02/09/22 2230 02/09/22 2243  BP: (!) 124/93 122/82  Pulse: 78 79  Resp: 18 16  Temp:  (!) 36.4 C  SpO2: 100% 99%    Last Pain:  Vitals:   02/09/22 2243  TempSrc: Temporal  PainSc: 3                  Tonny Bollman

## 2022-02-12 ENCOUNTER — Telehealth: Payer: Self-pay | Admitting: Obstetrics and Gynecology

## 2022-02-12 NOTE — Telephone Encounter (Signed)
Pt called and confirmed the post op appt.

## 2022-02-12 NOTE — Telephone Encounter (Addendum)
I contacted patient via phone. No answer, Voicemail is full unable to leave message. Patient is scheduled for post op with Dr. Marcelline Mates on 02/19/22 at 3:15 pm.

## 2022-02-14 LAB — SURGICAL PATHOLOGY

## 2022-02-18 NOTE — Progress Notes (Unsigned)
    OBSTETRICS/GYNECOLOGY POST-OPERATIVE CLINIC VISIT  Subjective:     Penny Gomez is a 37 y.o. female who presents to the clinic 2 weeks status post LAPAROSCOPIC BILATERAL SALPINGECTOMY for right ectopic pregnancy t hat occurred with remote h/o BTL . Eating a regular diet without difficulty. Bowel movements are normal. The patient is not having any pain.  The following portions of the patient's history were reviewed and updated as appropriate: allergies, current medications, past family history, past medical history, past social history, past surgical history, and problem list.  Review of Systems Pertinent items noted in HPI and remainder of comprehensive ROS otherwise negative.   Objective:   BP 118/73   Pulse 81   Resp 16   Wt 129 lb 8 oz (58.7 kg)   LMP 12/19/2021 (Approximate)   BMI 25.29 kg/m  Body mass index is 25.29 kg/m.  General:  alert and no distress  Abdomen: soft, bowel sounds active, non-tender  Incision:   healing well, no drainage, no erythema, no hernia, no seroma, no swelling, no dehiscence, incision well approximated    Pathology:  FALLOPIAN TUBE (SIDE NOT SPECIFIED); SALPINGECTOMY:  - DILATED FALLOPIAN TUBE WITH INTRALUMINAL IMMATURE CHORIONIC VILLI,  DECIDUAL TISSUE AND BLOOD CLOT, CONSISTENT WITH ECTOPIC PREGNANCY.   Assessment:   Patient s/p LAPAROSCOPIC BILATERAL SALPINGECTOMY (surgery)  Doing well postoperatively.  Enlarged fibroid uterus  Plan:   1. Continue any current medications as instructed by provider. 2. Wound care discussed. 3. Operative findings again reviewed. Pathology report discussed. 4. Activity restrictions: none 5. Anticipated return to work: not applicable, patient reports she lost her job this morning. 6.  Patient notes that her fibroids are causing her any issues at this time no intervention required currently. 7. Follow up: 6  months  for annual exam.  Discussed that if patient is still not patient insurance by that  time, can still be seen as a self-pay patient, or can opt for referral to the health department for cost effectiveness. Will also get previous medical records from [facility].     [name], MD [place]

## 2022-02-19 ENCOUNTER — Encounter: Payer: Self-pay | Admitting: Obstetrics and Gynecology

## 2022-02-19 ENCOUNTER — Ambulatory Visit (INDEPENDENT_AMBULATORY_CARE_PROVIDER_SITE_OTHER): Payer: Self-pay | Admitting: Obstetrics and Gynecology

## 2022-02-19 VITALS — BP 118/73 | HR 81 | Resp 16 | Wt 129.5 lb

## 2022-02-19 DIAGNOSIS — Z9079 Acquired absence of other genital organ(s): Secondary | ICD-10-CM

## 2022-02-19 DIAGNOSIS — D251 Intramural leiomyoma of uterus: Secondary | ICD-10-CM

## 2022-02-19 DIAGNOSIS — D250 Submucous leiomyoma of uterus: Secondary | ICD-10-CM

## 2022-02-19 DIAGNOSIS — Z4889 Encounter for other specified surgical aftercare: Secondary | ICD-10-CM

## 2022-02-20 ENCOUNTER — Telehealth: Payer: Self-pay

## 2022-02-20 ENCOUNTER — Encounter: Payer: Self-pay | Admitting: Obstetrics and Gynecology

## 2022-02-20 NOTE — Telephone Encounter (Signed)
Pt had surgery 02/09/22 and would like to know how long can she expect to bleed after surgery. Per Dr. Marcelline Mates, she can bleed up to 2 weeks after surgery. Pt called triage and said as of yesterday her bleeding had completely stopped and this morning she woke up bleeding. Says bleeding is like a period flow, about to change her first pad of the day. Pain of 4 from 1-10 with 10 being the worse. Dr. Marcelline Mates also advised this could be the beginning of her period. Advised vag bleeding protocol.

## 2022-03-18 NOTE — Progress Notes (Signed)
    GYNECOLOGY PROGRESS NOTE  Subjective:    Patient ID: Penny Gomez, female    DOB: August 13, 1985, 37 y.o.   MRN: [id number]  HPI  Patient is a 37 y.o. G35P2002 female who presents for evaluation for vaginal irritation.  {Common ambulatory SmartLinks:19316}  Review of Systems {ros; complete:30496}   Objective:   There were no vitals taken for this visit. There is no height or weight on file to calculate BMI. General appearance: {general exam:16600} Abdomen: {abdominal exam:16834} Pelvic: {pelvic exam:16852::"cervix normal in appearance","external genitalia normal","no adnexal masses or tenderness","no cervical motion tenderness","rectovaginal septum normal","uterus normal size, shape, and consistency","vagina normal without discharge"} Extremities: {extremity exam:5109} Neurologic: {neuro exam:17854}   Assessment:   No diagnosis found.   Plan:   There are no diagnoses linked to this encounter.     [name], MD [place]

## 2022-03-19 ENCOUNTER — Ambulatory Visit (INDEPENDENT_AMBULATORY_CARE_PROVIDER_SITE_OTHER): Payer: Self-pay | Admitting: Obstetrics and Gynecology

## 2022-03-19 ENCOUNTER — Encounter: Payer: Self-pay | Admitting: Obstetrics and Gynecology

## 2022-03-19 VITALS — BP 118/76 | HR 72 | Resp 16 | Ht 60.0 in | Wt 129.6 lb

## 2022-03-19 DIAGNOSIS — R350 Frequency of micturition: Secondary | ICD-10-CM | POA: Insufficient documentation

## 2022-03-19 DIAGNOSIS — R399 Unspecified symptoms and signs involving the genitourinary system: Secondary | ICD-10-CM

## 2022-03-19 DIAGNOSIS — N898 Other specified noninflammatory disorders of vagina: Secondary | ICD-10-CM

## 2022-03-19 LAB — POCT URINALYSIS DIPSTICK
Bilirubin, UA: NEGATIVE
Glucose, UA: NEGATIVE
Ketones, UA: NEGATIVE
Leukocytes, UA: NEGATIVE
Nitrite, UA: NEGATIVE
Protein, UA: NEGATIVE
Spec Grav, UA: 1.015 (ref 1.010–1.025)
Urobilinogen, UA: 0.2 E.U./dL
pH, UA: 7 (ref 5.0–8.0)

## 2022-03-20 ENCOUNTER — Telehealth: Payer: Self-pay

## 2022-03-20 ENCOUNTER — Encounter: Payer: Self-pay | Admitting: Obstetrics and Gynecology

## 2022-03-20 ENCOUNTER — Telehealth (INDEPENDENT_AMBULATORY_CARE_PROVIDER_SITE_OTHER): Payer: Self-pay | Admitting: Obstetrics and Gynecology

## 2022-03-20 VITALS — Resp 16 | Ht 60.0 in | Wt 129.0 lb

## 2022-03-20 DIAGNOSIS — B009 Herpesviral infection, unspecified: Secondary | ICD-10-CM

## 2022-03-20 DIAGNOSIS — F439 Reaction to severe stress, unspecified: Secondary | ICD-10-CM

## 2022-03-20 DIAGNOSIS — R45851 Suicidal ideations: Secondary | ICD-10-CM

## 2022-03-20 LAB — HSV 1 AND 2 AB, IGG
HSV 1 Glycoprotein G Ab, IgG: 1.3 index — ABNORMAL HIGH (ref 0.00–0.90)
HSV 2 IgG, Type Spec: 13.1 index — ABNORMAL HIGH (ref 0.00–0.90)

## 2022-03-20 MED ORDER — VALACYCLOVIR HCL 1 G PO TABS: 1000.0000 mg | ORAL_TABLET | Freq: Two times a day (BID) | ORAL | 3 refills | Status: DC

## 2022-03-20 NOTE — Progress Notes (Signed)
Virtual Visit via Video Note  I connected with Penny Gomez on 03/20/22 at  4:15 PM EST by a video enabled telemedicine application and verified that I am speaking with the correct person using two identifiers.  Location: Patient: Musician Provider: Office   I discussed the limitations of evaluation and management by telemedicine and the availability of in person appointments. The patient expressed understanding and agreed to proceed.  History of Present Illness: Patient is a 37 y.o. G66P2012 female who presents for further discussion of her test results.  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  Patient at that time became very distraught, and expressed that she "could not handle anything else and no longer desired to live".  I reviewed patient's diagnosis and informed her that this diagnosis although lifelong was not life-threatening.  Patient continued to express her feelings of being overwhelmed by this.  Notes that she is afraid to tell her partner as she is concerned he will leave.  She also reports dealing with several significant life stressors at this time including the loss of her job early last month, being behind on her bills, and the negative impact her prior relationship had on her mental psyche.  Currently has a new partner and is concerned about her possible transmission of this infection to him.   Observations/Objective: Resp. rate 16, height 5' (1.524 m), weight 129 lb (58.5 kg). [name] App: patient in obvious emotional distress, tearful.  Psych: emotional, irrational thoughts, tearful, anxious mood.      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  Strongly encouraged patient to discuss with her new partner and her own time.  Also encouraged safe sex practices with use of barrier methods until infection is cleared or at least for 2 weeks.  Encouraged partner testing.  2. Social stressors with  passive suicidal ideation -patient with several significant social stressors at this time.  Strongly encouraged to consider counseling as she is not just dealing with recent diagnosis, but feelings of anxiety, hopelessness, and being overwhelmed. Counseled on 1-800 help lines, and will also help to locate walk-in mental health clinics if needed (currently in [place]).  Also encouraged to check in with family/friends for support.  Advised on applying for Medicaid to help with medical needs during her time of unemployment, and notes she has already filed for unemployment pay. GIven offer to utilize medications at this time for her symptoms, but patient declines.    Follow Up Instructions: Advised to f/u with counseling.    I discussed the assessment and treatment plan with the patient. The patient was provided an opportunity to ask questions and all were answered. The patient agreed with the plan and demonstrated an understanding of the instructions.   The patient was advised to call back or seek an in-person evaluation if the symptoms worsen or if the condition fails to improve as anticipated.  I provided 24 minutes of non-face-to-face time during this encounter.   [name], MD [facility]

## 2022-03-20 NOTE — Telephone Encounter (Signed)
Patient contacted office upset stating that she has reviewed over her results on my chart for recent HSV test.Patient is requesting a call back as soon as possible from Dr. Marcelline Mates to discuss results and next steps. Please review labs and advise. KW

## 2022-03-20 NOTE — Telephone Encounter (Signed)
Patient has contacted office back requesting a call back in regards to lab results. KW

## 2022-03-20 NOTE — Telephone Encounter (Signed)
Contacted patient regarding lab results as requested.  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  Patient extremely upset, states that  "this is too much to handle, and that I can't tell my partner this".  Notes that she has been through so much over the past year with her ex-partner and infidelity, and now that she has a new partner does not want to mess up her relationship.  States that this her breaking point and "does not feel like going on living".  I strongly encouraged patient to come in to the office for further assessment, notes that she cannot come this afternoon as she has to pick up her kids from school.  Will schedule emergency telehealth visit.    [name], MD [facility]

## 2022-03-26 ENCOUNTER — Encounter: Payer: Self-pay | Admitting: Obstetrics and Gynecology

## 2022-08-28 ENCOUNTER — Other Ambulatory Visit: Payer: Self-pay

## 2022-08-28 DIAGNOSIS — N6323 Unspecified lump in the left breast, lower outer quadrant: Secondary | ICD-10-CM

## 2022-09-23 ENCOUNTER — Other Ambulatory Visit: Payer: Self-pay | Admitting: Obstetrics and Gynecology

## 2022-09-23 ENCOUNTER — Ambulatory Visit: Payer: Self-pay | Attending: Hematology and Oncology | Admitting: Hematology and Oncology

## 2022-09-23 ENCOUNTER — Ambulatory Visit
Admission: RE | Admit: 2022-09-23 | Discharge: 2022-09-23 | Disposition: A | Discharge status: Home / Self Care | Payer: Medicaid Other | Source: Ambulatory Visit | Attending: Obstetrics and Gynecology | Admitting: Obstetrics and Gynecology

## 2022-09-23 VITALS — BP 113/73 | Wt 134.2 lb

## 2022-09-23 DIAGNOSIS — N6323 Unspecified lump in the left breast, lower outer quadrant: Secondary | ICD-10-CM

## 2022-09-23 DIAGNOSIS — N63 Unspecified lump in unspecified breast: Secondary | ICD-10-CM

## 2022-09-23 DIAGNOSIS — R599 Enlarged lymph nodes, unspecified: Secondary | ICD-10-CM

## 2022-09-23 DIAGNOSIS — R928 Other abnormal and inconclusive findings on diagnostic imaging of breast: Secondary | ICD-10-CM

## 2022-09-23 NOTE — Progress Notes (Signed)
Ms. NIDIA FEATHER is a 37 y.o. female who presents to [facility] clinic today with complaint of left breast mass.    Pap Smear: Pap not smear completed today. Last Pap smear was 03/31/20 at [facility] clinic and was normal. Per patient has no history of an abnormal Pap smear. Last Pap smear result is available in Epic.   Physical exam: Breasts Breasts symmetrical. No skin abnormalities bilateral breasts. No nipple retraction bilateral breasts. No nipple discharge bilateral breasts. No lymphadenopathy. No lumps palpated right breast. Left breast with mass noted in lower inner quadrant. MM RT BREAST BX W LOC DEV 1ST LESION IMAGE BX SPEC STEREO GUIDE  Addendum Date: 05/25/2019   ADDENDUM REPORT: 05/24/2019 11:46 ADDENDUM: PATHOLOGY revealed: A. RIGHT BREAST, UPPER INNER QUADRANT; STEREOTACTIC BIOPSY: - BENIGN BREAST TISSUE WITH PATCHY STROMAL FIBROSIS. - NEGATIVE FOR ATYPIA AND MALIGNANCY. Pathology results are CONCORDANT with imaging findings, per Dr. Edwin Cap. Pathology results and recommendations below were discussed with patient by telephone on 05/21/2019. Patient reported biopsy site doing well with slight tenderness at the site. Post biopsy care instructions were reviewed and questions were answered. Patient was instructed to call [facility] if any concerns or questions arise related to the biopsy. Recommendation: Patient instructed to return for annual bilateral screening mammogram which will be due March 2022. In addition, given strong family history of breast cancer, including in her mother diagnosed with breast cancer in her 30s, genetic counseling is recommended if this has not already been performed. The American Cancer Society recommends annual MRI and mammography in patients with an estimated lifetime risk of developing breast cancer greater than 20 - 25%, or who are known or suspected to be positive for the breast cancer gene. Addendum by Randa Lynn RN on 05/24/2019.  Electronically Signed   By: Edwin Cap M.D.   On: 05/24/2019 11:46   Result Date: 05/25/2019 CLINICAL DATA:  37 year old female with an indeterminate asymmetry within the upper inner quadrant of the right breast. EXAM: RIGHT BREAST STEREOTACTIC CORE NEEDLE BIOPSY COMPARISON:  Previous exams. FINDINGS: The patient and I discussed the procedure of stereotactic-guided biopsy including benefits and alternatives. We discussed the high likelihood of a successful procedure. We discussed the risks of the procedure including infection, bleeding, tissue injury, clip migration, and inadequate sampling. Informed written consent was given. The usual time out protocol was performed immediately prior to the procedure. Using sterile technique and 1% Lidocaine as local anesthetic, under stereotactic guidance, a 9 gauge vacuum assisted device was used to perform core needle biopsy of the asymmetry within the upper inner right breast using a superior to inferior approach. Lesion quadrant: Upper inner At the conclusion of the procedure, and X shaped tissue marker clip was deployed into the biopsy cavity. Follow-up 2-view mammogram was performed and dictated separately. IMPRESSION: Stereotactic-guided biopsy of the asymmetry within the upper inner right breast. No apparent complications. Electronically Signed: By: Edwin Cap M.D. On: 05/20/2019 13:25   MM CLIP PLACEMENT RIGHT  Result Date: 05/20/2019 CLINICAL DATA:  Post stereotactic guided biopsy of an asymmetry in the upper inner right breast. EXAM: DIAGNOSTIC RIGHT MAMMOGRAM POST STEREOTACTIC BIOPSY COMPARISON:  Previous exams. FINDINGS: Mammographic images were obtained following stereotactic guided biopsy of an asymmetry in the upper inner right breast. An X shaped biopsy marking clip is present at the site of the biopsied asymmetry in the upper inner right breast. IMPRESSION: X shaped biopsy marking clip at site of biopsied asymmetry in the upper inner right  breast.  Final Assessment: Post Procedure Mammograms for Marker Placement Electronically Signed   By: Edwin Cap M.D.   On: 05/20/2019 13:30   MM DIAG BREAST TOMO UNI RIGHT  Result Date: 05/05/2019 CLINICAL DATA:  Patient recalled from screening for right breast asymmetry. Patient is high risk with multiple family members diagnosed with breast cancer in their early 30s. EXAM: DIGITAL DIAGNOSTIC RIGHT MAMMOGRAM WITH CAD AND TOMO ULTRASOUND RIGHT BREAST COMPARISON:  Previous exam(s). ACR Breast Density Category c: The breast tissue is heterogeneously dense, which may obscure small masses. FINDINGS: There is a persistent focal asymmetry demonstrated best on the cc view within the medial aspect of the right breast middle depth. Mammographic images were processed with CAD. On physical exam, no discrete mass is palpated within the medial right breast. Targeted ultrasound is performed, showing normal dense tissue without suspicious mass within the medial right breast. No right axillary adenopathy. IMPRESSION: Sonographically occult focal asymmetry within the medial aspect of the right breast. Given the patient's family history, further evaluation with biopsy is recommended. RECOMMENDATION: Indeterminate focal asymmetry medial right breast. Given the patient's family history and elevated lifetime risk, recommend stereotactic guided core needle biopsy. I have discussed the findings and recommendations with the patient. If applicable, a reminder letter will be sent to the patient regarding the next appointment. BI-RADS CATEGORY  4: Suspicious. Electronically Signed   By: Annia Belt M.D.   On: 05/05/2019 15:32   MM 3D SCREEN BREAST BILATERAL  Result Date: 04/07/2019 CLINICAL DATA:  Screening. EXAM: DIGITAL SCREENING BILATERAL MAMMOGRAM WITH TOMO AND CAD COMPARISON:  None. ACR Breast Density Category c: The breast tissue is heterogeneously dense, which may obscure small masses. FINDINGS: In the right breast, a  possible mass warrants further evaluation. In the left breast, no findings suspicious for malignancy. Images were processed with CAD. IMPRESSION: Further evaluation is suggested for possible mass in the right breast. RECOMMENDATION: 1. Diagnostic mammogram and possibly ultrasound of the right breast. (Code:[id number]) 2. Consider genetics assessment to determine the patient's lifetime risk of breast cancer given her family history, if this has not already been performed. Per American Cancer Society guidelines, if the patient has a calculated lifetime risk of developing breast cancer of greater than 20%, annual screening MRI of the breasts would be recommended at the time of screening mammography. The patient will be contacted regarding the findings, and additional imaging will be scheduled. BI-RADS CATEGORY  0: Incomplete. Need additional imaging evaluation and/or prior mammograms for comparison. Electronically Signed   By: Frederico Hamman M.D.   On: 04/07/2019 15:45          Pelvic/Bimanual Pap is not indicated today    Smoking History: Patient has never smoked and was not referred to quit line.    Patient Navigation: Patient education provided. Access to services provided for patient through [facility] program. No interpreter provided. No transportation provided   Colorectal Cancer Screening: Per patient has never had colonoscopy completed No complaints today.    Breast and Cervical Cancer Risk Assessment: Patient has family history of breast cancer, with her mother (33yo), Maternal aunt (60 yo), and maternal cousin (40 yo). Patient does not have history of cervical dysplasia, immunocompromised, or DES exposure in-utero.  Risk Assessment   No risk assessment data     A: [facility] exam without pap smear Complaint of left lower inner quadrant mass, painful for about 1 month.   P: Referred patient to the [facility] for a diagnostic mammogram. Appointment scheduled  09/23/2022.  Pascal Lux, NP 09/23/2022 8:49 AM

## 2022-09-23 NOTE — Patient Instructions (Signed)
Taught Penny Gomez about self breast awareness and gave educational materials to take home. Patient did not need a Pap smear today due to last Pap smear was in 03/31/20 per patient. Let her know [facility] will cover Pap smears every 5 years unless has a history of abnormal Pap smears. Referred patient to the [facility] for screening mammogram. Appointment scheduled for 09/23/2022. Patient aware of appointment and will be there. Let patient know will follow up with her within the next couple weeks with results. Felicity Pellegrini Schraeder verbalized understanding.  Pascal Lux, NP 9:12 AM

## 2022-09-25 ENCOUNTER — Other Ambulatory Visit: Payer: Self-pay | Admitting: Obstetrics and Gynecology

## 2022-09-25 DIAGNOSIS — N63 Unspecified lump in unspecified breast: Secondary | ICD-10-CM

## 2022-09-25 DIAGNOSIS — R928 Other abnormal and inconclusive findings on diagnostic imaging of breast: Secondary | ICD-10-CM

## 2022-09-25 DIAGNOSIS — R599 Enlarged lymph nodes, unspecified: Secondary | ICD-10-CM

## 2022-10-02 ENCOUNTER — Ambulatory Visit
Admission: RE | Admit: 2022-10-02 | Discharge: 2022-10-02 | Disposition: A | Discharge status: Home / Self Care | Payer: Medicaid Other | Source: Ambulatory Visit | Attending: Obstetrics and Gynecology | Admitting: Obstetrics and Gynecology

## 2022-10-02 DIAGNOSIS — R928 Other abnormal and inconclusive findings on diagnostic imaging of breast: Secondary | ICD-10-CM | POA: Insufficient documentation

## 2022-10-02 DIAGNOSIS — R599 Enlarged lymph nodes, unspecified: Secondary | ICD-10-CM | POA: Insufficient documentation

## 2022-10-02 DIAGNOSIS — N63 Unspecified lump in unspecified breast: Secondary | ICD-10-CM | POA: Insufficient documentation

## 2022-10-02 HISTORY — PX: BREAST BIOPSY: SHX20

## 2022-10-02 MED ORDER — LIDOCAINE 1 % OPTIME INJ - NO CHARGE
2.0000 mL | Freq: Once | INTRAMUSCULAR | Status: AC
  Administered 2022-10-02: 2 mL via INTRADERMAL
  Filled 2022-10-02: qty 2

## 2022-10-02 MED ORDER — LIDOCAINE-EPINEPHRINE 1 %-1:100000 IJ SOLN
5.0000 mL | Freq: Once | INTRAMUSCULAR | Status: AC
  Administered 2022-10-02: 5 mL

## 2022-10-02 MED ORDER — LIDOCAINE-EPINEPHRINE 1 %-1:100000 IJ SOLN
5.0000 mL | Freq: Once | INTRAMUSCULAR | Status: AC
  Administered 2022-10-02: 5 mL
  Filled 2022-10-02: qty 5

## 2022-10-02 MED ORDER — LIDOCAINE 1 % OPTIME INJ - NO CHARGE
2.0000 mL | Freq: Once | INTRAMUSCULAR | Status: AC
  Administered 2022-10-02: 2 mL via INTRADERMAL

## 2022-10-04 ENCOUNTER — Telehealth: Payer: Self-pay

## 2022-10-04 ENCOUNTER — Encounter: Payer: Self-pay | Admitting: *Deleted

## 2022-10-04 DIAGNOSIS — N6323 Unspecified lump in the left breast, lower outer quadrant: Secondary | ICD-10-CM

## 2022-10-04 DIAGNOSIS — C50919 Malignant neoplasm of unspecified site of unspecified female breast: Secondary | ICD-10-CM

## 2022-10-04 NOTE — Telephone Encounter (Signed)
Patient contacted regarding completing [facility] Medicaid application. Patient was in the process of taking her kids to the doctor. Patient informed that I  will contact her on 10/07/2022 to complete, patient to sign form at 10/10/2022 [facility] visit.

## 2022-10-04 NOTE — Progress Notes (Signed)
Received referral for newly diagnosed breast cancer from [facility].  Navigation initiated.  She will see Dr. Cathie Hoops at 9:30 on 8/29 per patient preference.   She needed early morning because she works 3rd shift.  Referral placed to [facility], their office will call her with the appointment.

## 2022-10-10 ENCOUNTER — Inpatient Hospital Stay: Payer: Medicaid Other | Attending: Oncology | Admitting: Oncology

## 2022-10-10 ENCOUNTER — Inpatient Hospital Stay: Payer: Medicaid Other

## 2022-10-10 ENCOUNTER — Encounter: Payer: Self-pay | Admitting: Oncology

## 2022-10-10 ENCOUNTER — Encounter: Payer: Self-pay | Admitting: *Deleted

## 2022-10-10 VITALS — BP 112/61 | HR 80 | Temp 97.0°F | Resp 18 | Wt 135.6 lb

## 2022-10-10 DIAGNOSIS — C50919 Malignant neoplasm of unspecified site of unspecified female breast: Secondary | ICD-10-CM

## 2022-10-10 DIAGNOSIS — Z79899 Other long term (current) drug therapy: Secondary | ICD-10-CM | POA: Insufficient documentation

## 2022-10-10 DIAGNOSIS — Z803 Family history of malignant neoplasm of breast: Secondary | ICD-10-CM | POA: Diagnosis not present

## 2022-10-10 DIAGNOSIS — Z800 Family history of malignant neoplasm of digestive organs: Secondary | ICD-10-CM | POA: Diagnosis not present

## 2022-10-10 DIAGNOSIS — Z809 Family history of malignant neoplasm, unspecified: Secondary | ICD-10-CM | POA: Insufficient documentation

## 2022-10-10 DIAGNOSIS — C50312 Malignant neoplasm of lower-inner quadrant of left female breast: Secondary | ICD-10-CM

## 2022-10-10 DIAGNOSIS — D509 Iron deficiency anemia, unspecified: Secondary | ICD-10-CM | POA: Diagnosis not present

## 2022-10-10 DIAGNOSIS — Z170 Estrogen receptor positive status [ER+]: Secondary | ICD-10-CM | POA: Diagnosis not present

## 2022-10-10 DIAGNOSIS — Z79624 Long term (current) use of inhibitors of nucleotide synthesis: Secondary | ICD-10-CM | POA: Diagnosis not present

## 2022-10-10 DIAGNOSIS — Z7189 Other specified counseling: Secondary | ICD-10-CM | POA: Insufficient documentation

## 2022-10-10 LAB — COMPREHENSIVE METABOLIC PANEL
ALT: 12 U/L (ref 0–44)
AST: 18 U/L (ref 15–41)
Albumin: 3.9 g/dL (ref 3.5–5.0)
Alkaline Phosphatase: 61 U/L (ref 38–126)
Anion gap: 5 (ref 5–15)
BUN: 12 mg/dL (ref 6–20)
CO2: 22 mmol/L (ref 22–32)
Calcium: 8.9 mg/dL (ref 8.9–10.3)
Chloride: 109 mmol/L (ref 98–111)
Creatinine, Ser: 0.59 mg/dL (ref 0.44–1.00)
GFR, Estimated: >60 mL/min (ref >60)
Glucose, Bld: 101 mg/dL — ABNORMAL HIGH (ref 70–99)
Potassium: 3.8 mmol/L (ref 3.5–5.1)
Sodium: 136 mmol/L (ref 135–145)
Total Bilirubin: 0.4 mg/dL (ref 0.3–1.2)
Total Protein: 7.3 g/dL (ref 6.5–8.1)

## 2022-10-10 LAB — CBC WITH DIFFERENTIAL/PLATELET
Abs Immature Granulocytes: 0.02 10*3/uL (ref 0.00–0.07)
Basophils Absolute: 0 10*3/uL (ref 0.0–0.1)
Basophils Relative: 0 %
Eosinophils Absolute: 0 10*3/uL (ref 0.0–0.5)
Eosinophils Relative: 0 %
HCT: 26.8 % — ABNORMAL LOW (ref 36.0–46.0)
Hemoglobin: 7.5 g/dL — ABNORMAL LOW (ref 12.0–15.0)
Immature Granulocytes: 0 %
Lymphocytes Relative: 28 %
Lymphs Abs: 2.2 10*3/uL (ref 0.7–4.0)
MCH: 18.8 pg — ABNORMAL LOW (ref 26.0–34.0)
MCHC: 28 g/dL — ABNORMAL LOW (ref 30.0–36.0)
MCV: 67.3 fL — ABNORMAL LOW (ref 80.0–100.0)
Monocytes Absolute: 0.6 10*3/uL (ref 0.1–1.0)
Monocytes Relative: 7 %
Neutro Abs: 4.9 10*3/uL (ref 1.7–7.7)
Neutrophils Relative %: 65 %
Platelets: 249 10*3/uL (ref 150–400)
RBC: 3.98 MIL/uL (ref 3.87–5.11)
RDW: 22.6 % — ABNORMAL HIGH (ref 11.5–15.5)
WBC: 7.7 10*3/uL (ref 4.0–10.5)
nRBC: 0 % (ref 0.0–0.2)

## 2022-10-10 NOTE — Assessment & Plan Note (Signed)
Curative intent. Discussed with patient.  

## 2022-10-10 NOTE — Progress Notes (Signed)
Accompanied patient and family to initial medical oncology appointment.   Reviewed Breast Cancer treatment handbook.   Care plan summary given to patient.   Reviewed outreach programs and cancer center services.   

## 2022-10-10 NOTE — Progress Notes (Signed)
Hematology/Oncology Consult Note [phone] [phone]     REFERRING PROVIDER: Catalina Antigua, MD    CHIEF COMPLAINTS/PURPOSE OF CONSULTATION:  Left triple negative breast cancer.   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  Images and pathology results were reviewed and discussed with patient.  Recommend bilateral MRI breast w wo contrast for further evaluation of extent of disease given her dense breast tissue.  Recommend neoadjuvant chemotherapy with ddAC x4 followed by Taxol weekly x 12 followed by surgery. Adjuvant chemotherapy plan based on pathology response.  If MRI showed more extensive disease, Stage II, will add carboplatin and immunotherapy.  I explained to the patient the risks and benefits of chemotherapy including all but not limited to infusion reaction, hair loss, hearing loss, mouth sore, nausea, vomiting, low blood counts, bleeding, heart failure, kidney failure and risk of life threatening infection and even death, secondary malignancy etc.   Patient voices understanding and willing to proceed chemotherapy.   # Chemotherapy education; she needs Medi port placement, discussed with Dr. Aleen Campi.  Antiemetics-Zofran and Compazine; EMLA cream sent to pharmacy Supportive care measures are necessary for patient well-being and will be provided as necessary. We spent sufficient time to discuss many aspect of care, questions were answered to patient's satisfaction.   Check cbc cmp CA 27.29, CA 15.3  Goals of care, counseling/discussion Curative intent. Discussed with patient.   Family history of cancer Refer to genetic counseling.   Microcytic anemia Likely due to iron  deficiency.  Will add on iron, TIBC, ferritin She has heavy menstrual period and anemia is likely secondary to chronic blood loss. Recommend IV Venofer treatments I discussed about the potential risks including but not limited to allergic reactions/infusion reactions including anaphylactic reactions, phlebitis, high blood pressure, wheezing, SOB, skin rash, weight gain,dark urine, leg swelling, back pain, headache, nausea and fatigue, etc. Patient tolerates oral iron supplement poorly and desires to achieved higher level of iron faster for adequate hematopoesis. Plan IV venofer weekly x 4 -5  Orders Placed This Encounter  Procedures   MR BREAST BILATERAL W WO CONTRAST INC CAD    Standing Status:   Future    Standing Expiration Date:   10/10/2023    Order Specific Question:   If indicated for the ordered procedure, I authorize the administration of contrast media per Radiology protocol    Answer:   Yes    Order Specific Question:   What is the patient's sedation requirement?    Answer:   No Sedation    Order Specific Question:   Does the patient have a pacemaker or implanted devices?    Answer:   No    Order Specific Question:   Radiology Contrast Protocol - do NOT remove file path    Answer:   [email or web address]    Order Specific Question:   Preferred imaging location?    Answer:   [facility] (table limit - 550lbs)   CBC with Differential/Platelet    Standing Status:   Future    Number of Occurrences:   1    Standing Expiration Date:   10/10/2023   Comprehensive metabolic panel    Standing Status:   Future    Number of Occurrences:  1    Standing Expiration Date:   10/10/2023   Cancer antigen 27.29    Standing Status:   Future    Number of Occurrences:   1    Standing Expiration Date:   10/10/2023   Cancer antigen 15-3    Standing Status:   Future    Number of Occurrences:   1    Standing Expiration Date:   10/10/2023   Ambulatory referral  to Genetics    Referral Priority:   Routine    Referral Type:   Consultation    Referral Reason:   Specialty Services Required    Number of Visits Requested:   1   ECHOCARDIOGRAM COMPLETE    Standing Status:   Future    Standing Expiration Date:   10/10/2023    Order Specific Question:   Where should this test be performed    Answer:   [facility]    Order Specific Question:   Perflutren DEFINITY (image enhancing agent) should be administered unless hypersensitivity or allergy exist    Answer:   Administer Perflutren    Order Specific Question:   Reason for exam-Echo    Answer:   Chemo  Z09   Follow-up 2 weeks to start chemotherapy. All questions were answered. The patient knows to call the clinic with any problems, questions or concerns.  [name], MD, PhD [facility] Hematology Oncology 10/10/2022    HISTORY OF PRESENTING ILLNESS:  Penny Gomez 37 y.o. female presents to establish care for left triple negative breast cancer I have reviewed her chart and materials related to her cancer extensively and collaborated history with the patient. Summary of oncologic history is as follows: Oncology History  Invasive carcinoma of breast (HCC)  07/24/2022 Mammogram   She noticed left breast mass for 1 month.  Bilateral diagnostic mammogram showed Suspicious palpable left breast mass 8 o'clock position. Cortically thickened left axillary lymph node   10/10/2022 Initial Diagnosis   Invasive carcinoma of breast (HCC)  10/02/2022  Left breast mass biopsy and left axillary lymph node biopsy.  Diagnosis 1. Breast, left, needle core biopsy, 8 o'clock 6 cmfn, heart clip - INVASIVE MAMMARY CARCINOMA, NO SPECIAL TYPE. - TUBULE FORMATION: SCORE 3 - NUCLEAR PLEOMORPHISM: SCORE 3 - MITOTIC COUNT: SCORE 3 - TOTAL SCORE: 9 - OVERALL GRADE: 3 - LYMPHOVASCULAR INVASION: NOT IDENTIFIED - CANCER LENGTH: 11 MM - CALCIFICATIONS: NOT IDENTIFIED - DUCTAL CARCINOMA IN SITU: PRESENT,  HIGH-GRADE - ER-, PR- HER2 - [IHC 1+] 2. Lymph node, needle/core biopsy, left axillary, hydromark - LYMPH NODE WITH REACTIVE CHANGES; NEGATIVE FOR MALIGNANCY.  Menarche at age of 67 First live birth at age of 80 OCP use: no History of hysterectomy: no Menopausal status: premenopausal History of HRT use: no History of chest radiation: no Number of previous breast biopsies:  right breast biopsy 05/20/2019 negative for malignancy Strong family history of cancer mother breast cancer diagnosed in 69s, maternal aunts x 2 breast cancer, maternal cousins breast cancer x 2, maternal grandmother pancreatic cancer.    10/10/2022 Cancer Staging   Staging form: Breast, AJCC 8th Edition - Clinical stage from 10/10/2022: Stage IB (cT1c, cN0, cM0, G3, ER-, PR-, HER2-) - Signed by [name], MD on 10/10/2022 Stage prefix: Initial diagnosis Nuclear grade: G3 Histologic grading system: 3 grade system    Today she was accompanied by her mother.   MEDICAL HISTORY:  Past Medical History:  Diagnosis Date   Breast lump    Fibroid, uterine  SURGICAL HISTORY: Past Surgical History:  Procedure Laterality Date   BREAST BIOPSY Right 05/20/2019   Affirm Bx- X- clip, neg   BREAST BIOPSY Left 10/02/2022   [place] Bx, path pending   BREAST BIOPSY Left 10/02/2022   [place] Bx Node- path pending   BREAST BIOPSY Left 10/02/2022   [place] LT BREAST BX W LOC DEV 1ST LESION IMG BX SPEC US GUIDE 10/02/2022 [facility]   CESAREAN SECTION     LAPAROSCOPIC BILATERAL SALPINGECTOMY Bilateral 02/09/2022   Procedure: LAPAROSCOPIC BILATERAL SALPINGECTOMY;  Surgeon: Hildred Laser, MD;  Location: [facility] ORS;  Service: Gynecology;  Laterality: Bilateral;   TUBAL LIGATION      SOCIAL HISTORY: Social History   Socioeconomic History   Marital status: Single    Spouse name: Not on file   Number of children: 2   Years of education: Not on file   Highest education level: High school graduate  Occupational History   Not on file   Tobacco Use   Smoking status: Never   Smokeless tobacco: Never  Vaping Use   Vaping status: Never Used  Substance and Sexual Activity   Alcohol use: Yes    Comment: occ   Drug use: No   Sexual activity: Yes    Birth control/protection: Surgical  Other Topics Concern   Not on file  Social History Narrative   Not on file   Social Determinants of Health   Financial Resource Strain: Low Risk  (10/10/2022)   Overall Financial Resource Strain (CARDIA)    Difficulty of Paying Living Expenses: Not very hard  Food Insecurity: No Food Insecurity (10/10/2022)   Hunger Vital Sign    Worried About Running Out of Food in the Last Year: Never true    Ran Out of Food in the Last Year: Never true  Transportation Needs: No Transportation Needs (09/23/2022)   PRAPARE - Administrator, Civil Service (Medical): No    Lack of Transportation (Non-Medical): No  Physical Activity: Not on file  Stress: No Stress Concern Present (10/10/2022)   [employer] of Occupational Health - Occupational Stress Questionnaire    Feeling of Stress : Only a little  Social Connections: Not on file  Intimate Partner Violence: Not on file    FAMILY HISTORY: Family History  Problem Relation Age of Onset   Breast cancer Mother 59   Cancer Maternal Grandfather    Pancreatic cancer Maternal Grandfather    Breast cancer Maternal Aunt 33   Cancer Maternal Uncle    Breast cancer Cousin 57   Cancer Other     ALLERGIES:  has No Known Allergies.  MEDICATIONS:  Current Outpatient Medications  Medication Sig Dispense Refill   valACYclovir (VALTREX) 1000 MG tablet Take 1 tablet (1,000 mg total) by mouth 2 (two) times daily. Take for ten days. (Patient not taking: Reported on 10/10/2022) 20 tablet 3   No current facility-administered medications for this visit.    Review of Systems  Constitutional:  Negative for appetite change, chills, fatigue and fever.  HENT:   Negative for hearing loss and voice  change.   Eyes:  Negative for eye problems.  Respiratory:  Negative for chest tightness and cough.   Cardiovascular:  Negative for chest pain.  Gastrointestinal:  Negative for abdominal distention, abdominal pain and blood in stool.  Endocrine: Negative for hot flashes.  Genitourinary:  Negative for difficulty urinating and frequency.   Musculoskeletal:  Negative for arthralgias.  Skin:  Negative for itching and rash.  Neurological:  Negative for extremity weakness.  Hematological:  Negative for adenopathy.  Psychiatric/Behavioral:  Negative for confusion.      PHYSICAL EXAMINATION: ECOG PERFORMANCE STATUS: 0 - Asymptomatic  Vitals:   10/10/22 0920  BP: 112/61  Pulse: 80  Resp: 18  Temp: (!) 97 F (36.1 C)  SpO2: 100%   Filed Weights   10/10/22 0920  Weight: 135 lb 9.6 oz (61.5 kg)    Physical Exam Constitutional:      General: She is not in acute distress.    Appearance: She is not diaphoretic.  HENT:     Head: Normocephalic and atraumatic.  Eyes:     General: No scleral icterus.    Pupils: Pupils are equal, round, and reactive to light.  Cardiovascular:     Rate and Rhythm: Normal rate and regular rhythm.     Heart sounds: No murmur heard. Pulmonary:     Effort: Pulmonary effort is normal. No respiratory distress.     Breath sounds: No wheezing.  Abdominal:     General: There is no distension.     Palpations: Abdomen is soft.     Tenderness: There is no abdominal tenderness.  Musculoskeletal:        General: Normal range of motion.     Cervical back: Normal range of motion and neck supple.  Skin:    General: Skin is warm and dry.     Findings: No erythema.  Neurological:     Mental Status: She is alert and oriented to person, place, and time.     Cranial Nerves: No cranial nerve deficit.     Motor: No abnormal muscle tone.     Coordination: Coordination normal.  Psychiatric:        Mood and Affect: Mood and affect normal.     Comments: tearful    Small palpable left inner lower quadrant mass.  No palpable axillary lymphadenopathy bilaterally.  No palpable mass in right breast.  LABORATORY DATA:  I have reviewed the data as listed    Latest Ref Rng & Units 10/10/2022   10:45 AM 02/09/2022    2:58 PM 01/27/2022    7:30 PM  CBC  WBC 4.0 - 10.5 K/uL 7.7  14.4  11.5   Hemoglobin 12.0 - 15.0 g/dL 7.5  8.3  8.1   Hematocrit 36.0 - 46.0 % 26.8  29.6  29.0   Platelets 150 - 400 K/uL 249  264  372       Latest Ref Rng & Units 10/10/2022   10:45 AM 02/09/2022    2:58 PM 01/27/2022    7:30 PM  CMP  Glucose 70 - 99 mg/dL 161  096  045   BUN 6 - 20 mg/dL 12  7  14    Creatinine 0.44 - 1.00 mg/dL 4.09  8.11  9.14   Sodium 135 - 145 mmol/L 136  135  136   Potassium 3.5 - 5.1 mmol/L 3.8  3.2  3.5   Chloride 98 - 111 mmol/L 109  108  107   CO2 22 - 32 mmol/L 22  22  24    Calcium 8.9 - 10.3 mg/dL 8.9  8.7  9.0   Total Protein 6.5 - 8.1 g/dL 7.3  7.4  7.6   Total Bilirubin 0.3 - 1.2 mg/dL 0.4  0.4  0.5   Alkaline Phos 38 - 126 U/L 61  60  51   AST 15 - 41 U/L 18  20  19  ALT 0 - 44 U/L 12  11  13       RADIOGRAPHIC STUDIES: I have personally reviewed the radiological images as listed and agreed with the findings in the report. [place] LT BREAST BX W LOC DEV 1ST LESION IMG BX SPEC US GUIDE  Addendum Date: 10/03/2022   ADDENDUM REPORT: 10/03/2022 13:35 ADDENDUM: PATHOLOGY revealed: Site 1. Breast, left, needle core biopsy, 8 o'clock 6 cmfn, heart clip - INVASIVE MAMMARY CARCINOMA, NO SPECIAL TYPE. - OVERALL GRADE: 3 - LYMPHOVASCULAR INVASION: NOT IDENTIFIED. - CANCER LENGTH: 11 MM - CALCIFICATIONS: NOT IDENTIFIED. - DUCTAL CARCINOMA IN SITU: PRESENT, HIGH-GRADE. Pathology results are CONCORDANT with imaging findings, per Dr. Meda Klinefelter. PATHOLOGY revealed: Site 2. Lymph node, needle/core biopsy, left axillary, hydromark - LYMPH NODE WITH REACTIVE CHANGES; NEGATIVE FOR MALIGNANCY. Pathology results are CONCORDANT with imaging findings,  per Dr. Meda Klinefelter. Pathology results and recommendations below were discussed with patient and her mother Vayah Blot) by telephone on 10/03/2022. Patient reported biopsy site within normal limits with slight tenderness at the site. Post biopsy care instructions were reviewed, questions were answered and my direct phone number was provided to patient. Patient was instructed to call [facility] if any concerns or questions arise related to the biopsy. RECOMMENDATIONS: 1. Surgical and oncological consultation. Request for surgical and oncological consultation relayed to Irving Shows RN at [facility] by Randa Lynn RN on 10/03/2022. 2. Recommend pretreatment bilateral breast MRI with and without contrast to determine extent of breast disease given breast density and patient's young age. Pathology results reported by Randa Lynn RN on 10/03/2022. Electronically Signed   By: Meda Klinefelter M.D.   On: 10/03/2022 13:35   Result Date: 10/03/2022 CLINICAL DATA:  Palpable LEFT breast mass. Indeterminate LEFT axillary lymph node. Strong family history of breast cancer. EXAM: ULTRASOUND GUIDED LEFT BREAST CORE NEEDLE BIOPSY [place] AXILLARY NODE CORE BIOPSY LEFT COMPARISON:  Previous exam(s). PROCEDURE: I met with the patient and we discussed the procedure of ultrasound-guided biopsy, including benefits and alternatives. We discussed the high likelihood of a successful procedure. We discussed the risks of the procedure, including infection, bleeding, tissue injury, clip migration, and inadequate sampling. Informed written consent was given. The usual time-out protocol was performed immediately prior to the procedure. Site 1: LEFT breast 8 o'clock 6 cm from the nipple. Lesion quadrant: Lower inner quadrant Using sterile technique and 1% lidocaine and 1% lidocaine with epinephrine as local anesthetic, under direct ultrasound visualization, a 14 gauge spring-loaded device was used to perform  biopsy of a mass at 8 o'clock using a medial approach. At the conclusion of the procedure a heart shaped tissue marker clip was deployed into the biopsy cavity. Follow up 2 view mammogram was performed and dictated separately. Site 2: LEFT axillary lymph node Using sterile technique and 1% lidocaine and 1% lidocaine with epinephrine as local anesthetic, under direct ultrasound visualization, a 14 gauge spring-loaded device was used to perform biopsy of a LEFT axillary lymph node using a lateral approach. At the conclusion of the procedure a HYDROMARK 4 butterfly shaped tissue marker clip was deployed into the biopsy cavity. Follow up 2 view mammogram was performed and dictated separately. IMPRESSION: Ultrasound guided biopsy of a LEFT breast mass and LEFT axillary lymph node. No apparent complications. Electronically Signed: By: Meda Klinefelter M.D. On: 10/02/2022 09:58   [place] AXILLARY NODE CORE BIOPSY LEFT  Addendum Date: 10/03/2022   ADDENDUM REPORT: 10/03/2022 13:35 ADDENDUM: PATHOLOGY revealed: Site 1. Breast, left,  needle core biopsy, 8 o'clock 6 cmfn, heart clip - INVASIVE MAMMARY CARCINOMA, NO SPECIAL TYPE. - OVERALL GRADE: 3 - LYMPHOVASCULAR INVASION: NOT IDENTIFIED. - CANCER LENGTH: 11 MM - CALCIFICATIONS: NOT IDENTIFIED. - DUCTAL CARCINOMA IN SITU: PRESENT, HIGH-GRADE. Pathology results are CONCORDANT with imaging findings, per Dr. Meda Klinefelter. PATHOLOGY revealed: Site 2. Lymph node, needle/core biopsy, left axillary, hydromark - LYMPH NODE WITH REACTIVE CHANGES; NEGATIVE FOR MALIGNANCY. Pathology results are CONCORDANT with imaging findings, per Dr. Meda Klinefelter. Pathology results and recommendations below were discussed with patient and her mother Wrynn Gobrecht) by telephone on 10/03/2022. Patient reported biopsy site within normal limits with slight tenderness at the site. Post biopsy care instructions were reviewed, questions were answered and my direct phone number was provided to  patient. Patient was instructed to call [facility] if any concerns or questions arise related to the biopsy. RECOMMENDATIONS: 1. Surgical and oncological consultation. Request for surgical and oncological consultation relayed to Irving Shows RN at [facility] by Randa Lynn RN on 10/03/2022. 2. Recommend pretreatment bilateral breast MRI with and without contrast to determine extent of breast disease given breast density and patient's young age. Pathology results reported by Randa Lynn RN on 10/03/2022. Electronically Signed   By: Meda Klinefelter M.D.   On: 10/03/2022 13:35   Result Date: 10/03/2022 CLINICAL DATA:  Palpable LEFT breast mass. Indeterminate LEFT axillary lymph node. Strong family history of breast cancer. EXAM: ULTRASOUND GUIDED LEFT BREAST CORE NEEDLE BIOPSY [place] AXILLARY NODE CORE BIOPSY LEFT COMPARISON:  Previous exam(s). PROCEDURE: I met with the patient and we discussed the procedure of ultrasound-guided biopsy, including benefits and alternatives. We discussed the high likelihood of a successful procedure. We discussed the risks of the procedure, including infection, bleeding, tissue injury, clip migration, and inadequate sampling. Informed written consent was given. The usual time-out protocol was performed immediately prior to the procedure. Site 1: LEFT breast 8 o'clock 6 cm from the nipple. Lesion quadrant: Lower inner quadrant Using sterile technique and 1% lidocaine and 1% lidocaine with epinephrine as local anesthetic, under direct ultrasound visualization, a 14 gauge spring-loaded device was used to perform biopsy of a mass at 8 o'clock using a medial approach. At the conclusion of the procedure a heart shaped tissue marker clip was deployed into the biopsy cavity. Follow up 2 view mammogram was performed and dictated separately. Site 2: LEFT axillary lymph node Using sterile technique and 1% lidocaine and 1% lidocaine with epinephrine as local anesthetic,  under direct ultrasound visualization, a 14 gauge spring-loaded device was used to perform biopsy of a LEFT axillary lymph node using a lateral approach. At the conclusion of the procedure a HYDROMARK 4 butterfly shaped tissue marker clip was deployed into the biopsy cavity. Follow up 2 view mammogram was performed and dictated separately. IMPRESSION: Ultrasound guided biopsy of a LEFT breast mass and LEFT axillary lymph node. No apparent complications. Electronically Signed: By: Meda Klinefelter M.D. On: 10/02/2022 09:58   MS CLIP PLACEMENT LEFT  Result Date: 10/02/2022 CLINICAL DATA:  Status post ultrasound-guided biopsy. EXAM: DIAGNOSTIC LEFT MAMMOGRAM POST ULTRASOUND BIOPSY COMPARISON:  Previous exam(s). FINDINGS: Mammographic images were obtained following ultrasound guided biopsy of a LEFT breast mass at 8 o'clock. The heart biopsy marking clip is in expected position at the site of biopsy. Mammographic images were obtained following ultrasound guided biopsy of a LEFT axillary lymph node. The [facility] 4 butterfly biopsy marking clip is in expected position at the site of biopsy. IMPRESSION:  1. Appropriate positioning of the heart shaped biopsy marking clip at the site of biopsy in the inner breast. 2. Appropriate positioning of the [facility] shaped biopsy marking clip at the site of biopsy in the LEFT axilla. Final Assessment: Post Procedure Mammograms for Marker Placement Electronically Signed   By: Meda Klinefelter M.D.   On: 10/02/2022 09:54   MS 3D DIAG MAMMO BILAT BR (aka MM)  Result Date: 09/23/2022 CLINICAL DATA:  Patient presents for palpable mass within the left breast. EXAM: DIGITAL DIAGNOSTIC BILATERAL MAMMOGRAM WITH TOMOSYNTHESIS AND CAD; ULTRASOUND LEFT BREAST LIMITED TECHNIQUE: Bilateral digital diagnostic mammography and breast tomosynthesis was performed. The images were evaluated with computer-aided detection. ; Targeted ultrasound examination of the left breast was performed.  COMPARISON:  Previous exam(s). ACR Breast Density Category c: The breasts are heterogeneously dense, which may obscure small masses. FINDINGS: Underlying the palpable marker within the inner left breast is a small partially obscured mass. No additional masses, calcifications or distortion identified within either breast. Stable position biopsy clip right breast. On physical exam, there is a small palpable mass lower inner left breast Targeted ultrasound is performed, showing a 16 x 13 x 26 mm irregular hypoechoic mass left breast 8 o'clock position 6 cm from nipple at the site of concern. There is a mildly thickened left axillary lymph node with a cortex of 5 mm. IMPRESSION: Suspicious palpable left breast mass 8 o'clock position. Cortically thickened left axillary lymph node. RECOMMENDATION: Ultrasound guided core needle biopsy palpable left breast mass 8 o'clock position. Ultrasound-guided core needle biopsy cortically thickened left axillary lymph node. I have discussed the findings and recommendations with the patient. If applicable, a reminder letter will be sent to the patient regarding the next appointment. BI-RADS CATEGORY  4: Suspicious. Electronically Signed   By: Annia Belt M.D.   On: 09/23/2022 10:53   [place] LIMITED ULTRASOUND INCLUDING AXILLA LEFT BREAST   Result Date: 09/23/2022 CLINICAL DATA:  Patient presents for palpable mass within the left breast. EXAM: DIGITAL DIAGNOSTIC BILATERAL MAMMOGRAM WITH TOMOSYNTHESIS AND CAD; ULTRASOUND LEFT BREAST LIMITED TECHNIQUE: Bilateral digital diagnostic mammography and breast tomosynthesis was performed. The images were evaluated with computer-aided detection. ; Targeted ultrasound examination of the left breast was performed. COMPARISON:  Previous exam(s). ACR Breast Density Category c: The breasts are heterogeneously dense, which may obscure small masses. FINDINGS: Underlying the palpable marker within the inner left breast is a small partially obscured  mass. No additional masses, calcifications or distortion identified within either breast. Stable position biopsy clip right breast. On physical exam, there is a small palpable mass lower inner left breast Targeted ultrasound is performed, showing a 16 x 13 x 26 mm irregular hypoechoic mass left breast 8 o'clock position 6 cm from nipple at the site of concern. There is a mildly thickened left axillary lymph node with a cortex of 5 mm. IMPRESSION: Suspicious palpable left breast mass 8 o'clock position. Cortically thickened left axillary lymph node. RECOMMENDATION: Ultrasound guided core needle biopsy palpable left breast mass 8 o'clock position. Ultrasound-guided core needle biopsy cortically thickened left axillary lymph node. I have discussed the findings and recommendations with the patient. If applicable, a reminder letter will be sent to the patient regarding the next appointment. BI-RADS CATEGORY  4: Suspicious. Electronically Signed   By: Annia Belt M.D.   On: 09/23/2022 10:53

## 2022-10-10 NOTE — Assessment & Plan Note (Addendum)
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  Images and pathology results were reviewed and discussed with patient.  Recommend bilateral MRI breast w wo contrast for further evaluation of extent of disease given her dense breast tissue.  Recommend neoadjuvant chemotherapy with ddAC x4 followed by Taxol weekly x 12 followed by surgery. Adjuvant chemotherapy plan based on pathology response.  If MRI showed more extensive disease, Stage II, will add carboplatin and immunotherapy.  I explained to the patient the risks and benefits of chemotherapy including all but not limited to infusion reaction, hair loss, hearing loss, mouth sore, nausea, vomiting, low blood counts, bleeding, heart failure, kidney failure and risk of life threatening infection and even death, secondary malignancy etc.   Patient voices understanding and willing to proceed chemotherapy.   # Chemotherapy education; she needs Medi port placement, discussed with Dr. Aleen Campi.  Antiemetics-Zofran and Compazine; EMLA cream sent to pharmacy Supportive care measures are necessary for patient well-being and will be provided as necessary. We spent sufficient time to discuss many aspect of care, questions were answered to patient's satisfaction.   Check cbc cmp CA 27.29, CA 15.3

## 2022-10-10 NOTE — Assessment & Plan Note (Signed)
Likely due to iron deficiency.  Will add on iron, TIBC, ferritin She has heavy menstrual period and anemia is likely secondary to chronic blood loss. Recommend IV Venofer treatments I discussed about the potential risks including but not limited to allergic reactions/infusion reactions including anaphylactic reactions, phlebitis, high blood pressure, wheezing, SOB, skin rash, weight gain,dark urine, leg swelling, back pain, headache, nausea and fatigue, etc. Patient tolerates oral iron supplement poorly and desires to achieved higher level of iron faster for adequate hematopoesis. Plan IV venofer weekly x 4 -5

## 2022-10-10 NOTE — Addendum Note (Signed)
Addended by: [name] on: 10/10/2022 07:18 PM   Modules accepted: Orders

## 2022-10-10 NOTE — Progress Notes (Signed)
START ON PATHWAY REGIMEN - Breast     Cycles 1 through 4: A cycle is every 14 days:     Doxorubicin      Cyclophosphamide      Pegfilgrastim-xxxx    Cycles 5 through 16: A cycle is every 7 days:     Paclitaxel   **Always confirm dose/schedule in your pharmacy ordering system**  Patient Characteristics: Preoperative or Nonsurgical Candidate, M0 (Clinical Staging), Up to cT4c, Any N, M0, Neoadjuvant Therapy followed by Surgery, Invasive Disease, Chemotherapy, HER2 Negative, ER Negative, Platinum Therapy Not Indicated Therapeutic Status: Preoperative or Nonsurgical Candidate, M0 (Clinical Staging) AJCC M Category: cM0 AJCC Grade: G3 ER Status: Negative (-) AJCC 8 Stage Grouping: IB HER2 Status: Negative (-) AJCC T Category: cT1c AJCC N Category: cN0 PR Status: Negative (-) Breast Surgical Plan: Neoadjuvant Therapy followed by Surgery Intent of Therapy: Curative Intent, Discussed with Patient

## 2022-10-10 NOTE — Assessment & Plan Note (Signed)
Refer to genetic counseling. 

## 2022-10-10 NOTE — Addendum Note (Signed)
Addended by: Hulen Luster on: 10/10/2022 11:23 AM   Modules accepted: Orders

## 2022-10-11 ENCOUNTER — Ambulatory Visit (INDEPENDENT_AMBULATORY_CARE_PROVIDER_SITE_OTHER): Payer: Medicaid Other | Admitting: Surgery

## 2022-10-11 ENCOUNTER — Telehealth: Payer: Self-pay

## 2022-10-11 ENCOUNTER — Other Ambulatory Visit: Payer: Self-pay

## 2022-10-11 ENCOUNTER — Inpatient Hospital Stay: Payer: Medicaid Other

## 2022-10-11 ENCOUNTER — Encounter: Payer: Self-pay | Admitting: Oncology

## 2022-10-11 ENCOUNTER — Encounter: Payer: Self-pay | Admitting: Surgery

## 2022-10-11 VITALS — BP 119/74 | HR 96 | Temp 99.1°F | Ht 60.0 in | Wt 135.4 lb

## 2022-10-11 DIAGNOSIS — C50919 Malignant neoplasm of unspecified site of unspecified female breast: Secondary | ICD-10-CM

## 2022-10-11 DIAGNOSIS — D509 Iron deficiency anemia, unspecified: Secondary | ICD-10-CM

## 2022-10-11 DIAGNOSIS — C50312 Malignant neoplasm of lower-inner quadrant of left female breast: Secondary | ICD-10-CM | POA: Diagnosis not present

## 2022-10-11 LAB — RETIC PANEL
Immature Retic Fract: 13.4 % (ref 2.3–15.9)
RBC.: 4.09 MIL/uL (ref 3.87–5.11)
Retic Count, Absolute: 42.1 K/uL (ref 19.0–186.0)
Retic Ct Pct: 1 % (ref 0.4–3.1)
Reticulocyte Hemoglobin: 16.5 pg — ABNORMAL LOW

## 2022-10-11 LAB — CANCER ANTIGEN 15-3: CA 15-3: 10.5 U/mL (ref 0.0–25.0)

## 2022-10-11 LAB — IRON AND TIBC
Iron: 14 ug/dL — ABNORMAL LOW (ref 28–170)
Saturation Ratios: 3 % — ABNORMAL LOW (ref 10.4–31.8)
TIBC: 511 ug/dL — ABNORMAL HIGH (ref 250–450)
UIBC: 497 ug/dL

## 2022-10-11 LAB — FERRITIN: Ferritin: 2 ng/mL — ABNORMAL LOW (ref 11–307)

## 2022-10-11 LAB — CANCER ANTIGEN 27.29: CA 27.29: <9 U/mL (ref 0.0–38.6)

## 2022-10-11 NOTE — Progress Notes (Signed)
[facility] Clinical Social Work  Initial Assessment   Penny Gomez is a 37 y.o. year old female contacted by phone. Clinical Social Work was referred by nurse navigator for assessment of psychosocial needs.   SDOH (Social Determinants of Health) assessments performed: Yes   SDOH Screenings   Food Insecurity: No Food Insecurity (10/10/2022)  Housing: Low Risk  (10/10/2022)  Transportation Needs: No Transportation Needs (09/23/2022)  Utilities: Not At Risk (10/10/2022)  Depression (PHQ2-9): Low Risk  (10/10/2022)  Financial Resource Strain: Low Risk  (10/10/2022)  Stress: No Stress Concern Present (10/10/2022)  Tobacco Use: Low Risk  (10/11/2022)  Health Literacy: Adequate Health Literacy (10/10/2022)     Distress Screen completed: Yes     No data to display            Family/Social Information:  Housing Arrangement: patient lives with her mother and two children, ages 60 and 32. Family members/support persons in your life? Family Transportation concerns: no  Employment: Working full time.  She just started a new job.  Income source: Supported by [employer] and [employer] Financial concerns: Yes, current concerns Type of concern: Food Food access concerns: yes Religious or spiritual practice: Yes-Patient attends church. Services Currently in place:  She will be receiving medicaid.  Coping/ Adjustment to diagnosis: Patient understands treatment plan and what happens next? yes Concerns about diagnosis and/or treatment: How I will pay for the services I need Patient reported stressors: Food Hopes and/or priorities: Family Patient enjoys time with family/ friends Current coping skills/ strengths: Capable of independent living , Manufacturing systems engineer , Radio producer fund of knowledge , Motivation for treatment/growth , Religious Affiliation , and Supportive family/friends     SUMMARY: Current SDOH Barriers:  Limited access to food  Clinical Social Work Clinical Goal(s):  Explore community  resource options for unmet needs related to:  Financial Strain   Interventions: Discussed common feeling and emotions when being diagnosed with cancer, and the importance of support during treatment Informed patient of the support team roles and support services at [facility] Provided CSW contact information and encouraged patient to call with any questions or concerns Provided patient with information about the [employer] and other Secondary school teacher.  CSW to mail applications.  Securely Solicitor.  Patient to receive a bag of food and [employer] gift card during next visit.   Follow Up Plan: Patient will contact CSW with any support or resource needs Patient verbalizes understanding of plan: Yes    Dorothey Baseman, LCSW Clinical Social Worker [facility]

## 2022-10-11 NOTE — Patient Instructions (Addendum)
Our surgery scheduler Britta Mccreedy will call you within 24-48 hours to get you scheduled. If you have not heard from her after 48 hours, please call our office. Have the blue sheet available when she calls to write down important information.  If you have any concerns or questions, please feel free to call our office.   Implanted [facility] Guide An implanted port is a device that is placed under the skin. It is usually placed in the chest. The device may vary based on the need. Implanted ports can be used to give IV medicine, to take blood, or to give fluids. You may have an implanted port if: You need IV medicine that would be irritating to the small veins in your hands or arms. You need IV medicines, such as chemotherapy, for a long period of time. You need IV nutrition for a long period of time. You may have fewer limitations when using a port than you would if you used other types of long-term IVs. You will also likely be able to return to normal activities after your incision heals. An implanted port has two main parts: Reservoir. The reservoir is the part where a needle is inserted to give medicines or draw blood. The reservoir is round. After the port is placed, it appears as a small, raised area under your skin. Catheter. The catheter is a small, thin tube that connects the reservoir to a vein. Medicine that is inserted into the reservoir goes into the catheter and then into the vein. How is my port accessed? To access your port: A numbing cream may be placed on the skin over the port site. Your health care provider will put on a mask and sterile gloves. The skin over your port will be cleaned carefully with a germ-killing soap and allowed to dry. Your health care provider will gently pinch the port and insert a needle into it. Your health care provider will check for a blood return to make sure the port is in the vein and is still working (patent). If your port needs to remain accessed to get  medicine continuously (constant infusion), your health care provider will place a clear bandage (dressing) over the needle site. The dressing and needle will need to be changed every week, or as told by your health care provider. What is flushing? Flushing helps keep the port working. Follow instructions from your health care provider about how and when to flush the port. Ports are usually flushed with saline solution or a medicine called heparin. The need for flushing will depend on how the port is used: If the port is only used from time to time to give medicines or draw blood, the port may need to be flushed: Before and after medicines have been given. Before and after blood has been drawn. As part of routine maintenance. Flushing may be recommended every 4-6 weeks. If a constant infusion is running, the port may not need to be flushed. Throw away any syringes in a disposal container that is meant for sharp items (sharps container). You can buy a sharps container from a pharmacy, or you can make one by using an empty hard plastic bottle with a cover. How long will my port stay implanted? The port can stay in for as long as your health care provider thinks it is needed. When it is time for the port to come out, a surgery will be done to remove it. The surgery will be similar to the procedure that  was done to put the port in. Follow these instructions at home: Caring for your port and port site Flush your port as told by your health care provider. If you need an infusion over several days, follow instructions from your health care provider about how to take care of your port site. Make sure you: Change your dressing as told by your health care provider. Wash your hands with soap and water for at least 20 seconds before and after you change your dressing. If soap and water are not available, use alcohol-based hand sanitizer. Place any used dressings or infusion bags into a plastic bag. Throw that  bag in the trash. Keep the dressing that covers the needle clean and dry. Do not get it wet. Do not use scissors or sharp objects near the infusion tubing. Keep any external tubes clamped, unless they are being used. Check your port site every day for signs of infection. Check for: Redness, swelling, or pain. Fluid or blood. Warmth. Pus or a bad smell. Protect the skin around the port site. Avoid wearing bra straps that rub or irritate the site. Protect the skin around your port from seat belts. Place a soft pad over your chest if needed. Bathe or shower as told by your health care provider. The site may get wet as long as you are not actively receiving an infusion. General instructions  Return to your normal activities as told by your health care provider. Ask your health care provider what activities are safe for you. Carry a medical alert card or wear a medical alert bracelet at all times. This will let health care providers know that you have an implanted port in case of an emergency. Where to find more information American Cancer Society: www.cancer.org American Society of Clinical Oncology: www.cancer.net Contact a health care provider if: You have a fever or chills. You have redness, swelling, or pain at the port site. You have fluid or blood coming from your port site. Your incision feels warm to the touch. You have pus or a bad smell coming from the port site. Summary Implanted ports are usually placed in the chest for long-term IV access. Follow instructions from your health care provider about flushing the port and changing bandages (dressings). Take care of the area around your port by avoiding clothing that puts pressure on the area, and by watching for signs of infection. Protect the skin around your port from seat belts. Place a soft pad over your chest if needed. Contact a health care provider if you have a fever or you have redness, swelling, pain, fluid, or a bad  smell at the port site. This information is not intended to replace advice given to you by your health care provider. Make sure you discuss any questions you have with your health care provider. Document Revised: 08/01/2020 Document Reviewed: 08/01/2020 Elsevier Patient Education  2024 [employer].

## 2022-10-11 NOTE — H&P (View-Only) (Signed)
10/11/2022  Reason for Visit:  Triple negative left breast cancer  Requesting Provider:  Shane Crutch, PA-C  History of Present Illness: Penny Gomez is a 37 y.o. female presenting for evaluation of left breast cancer.  She had a palpable mass and pain in the left breast in the lower inner quadrant.  She had a mammogram and ultrasound on 09/23/22 which showed a 1.6 x 1.3 x 2.6 cm mass in the left breast at 8 o'clock, 6 CMFN, as well as a mildly thickened left axillary lymph node.  Both were biopsied on 10/02/22.  Pathology resulted in invasive breast cancer, and is ER/PR/Her2 negative.  She has seen Dr. Cathie Hoops with Oncology and currently she's planning on starting neoadjuvant chemotherapy.  Breast MRI is currently pending to evaluate extent of disease, particularly given the breast density seen on mammogram.  Patient today is doing well.  She reports a strong family history of breast cancer, with cancer in her mom also at a young age, two aunts and two cousins.  She has an appointment with Ms. Cowan on 9/18 for genetic counseling.  Menses started at age 75, had 3 pregnancies including 1 miscarriage, no breastfeeding, and no prior hormone therapy or birth control.  She's had a prior right breast biopsy in 2021 which showed benign breast tissue.  Past Medical History: Past Medical History:  Diagnosis Date   Breast lump    Fibroid, uterine      Past Surgical History: Past Surgical History:  Procedure Laterality Date   BREAST BIOPSY Right 05/20/2019   Affirm Bx- X- clip, neg   BREAST BIOPSY Left 10/02/2022   [place] Bx, path pending   BREAST BIOPSY Left 10/02/2022   [place] Bx Node- path pending   BREAST BIOPSY Left 10/02/2022   [place] LT BREAST BX W LOC DEV 1ST LESION IMG BX SPEC US GUIDE 10/02/2022 [facility]   CESAREAN SECTION     LAPAROSCOPIC BILATERAL SALPINGECTOMY Bilateral 02/09/2022   Procedure: LAPAROSCOPIC BILATERAL SALPINGECTOMY;  Surgeon: Hildred Laser, MD;  Location: [facility] ORS;  Service:  Gynecology;  Laterality: Bilateral;   TUBAL LIGATION      Home Medications: Prior to Admission medications   Not on File    Allergies: No Known Allergies  Social History:  reports that she has never smoked. She has never used smokeless tobacco. She reports current alcohol use. She reports that she does not use drugs.   Family History: Family History  Problem Relation Age of Onset   Breast cancer Mother 39   Cancer Maternal Grandfather    Pancreatic cancer Maternal Grandfather    Breast cancer Maternal Aunt 33   Cancer Maternal Uncle    Breast cancer Cousin 36   Cancer Other     Review of Systems: Review of Systems  Constitutional:  Negative for chills and fever.  HENT:  Negative for hearing loss.   Respiratory:  Negative for shortness of breath.   Cardiovascular:  Negative for chest pain.  Gastrointestinal:  Negative for abdominal pain, nausea and vomiting.  Genitourinary:  Negative for dysuria.  Musculoskeletal:  Negative for myalgias.  Skin:  Negative for rash.  Neurological:  Negative for dizziness.  Psychiatric/Behavioral:  Negative for depression.     Physical Exam BP 119/74   Pulse 96   Temp 99.1 F (37.3 C) (Oral)   Ht 5' (1.524 m)   Wt 135 lb 6.4 oz (61.4 kg)   LMP 09/13/2022 (Exact Date)   SpO2 100%   BMI 26.44 kg/m  CONSTITUTIONAL: No acute distress, well nourished. HEENT:  Normocephalic, atraumatic, extraocular motion intact. NECK: Trachea is midline, and there is no jugular venous distension.  RESPIRATORY:  Lungs are clear, and breath sounds are equal bilaterally. Normal respiratory effort without pathologic use of accessory muscles. CARDIOVASCULAR: Heart is regular without murmurs, gallops, or rubs. BREAST:  Left breast with a small area of palpable abnormality at 8 o'clock position, consistent with mammogram finding.  No new masses, no nipple discharge.  No left axillary lymphadenopathy.  Right breast without palpable masses, skin changes, or  nipple changes.  No right axillary lymphadenopathy. MUSCULOSKELETAL:  Normal muscle strength and tone in all four extremities.  No peripheral edema or cyanosis. SKIN: Skin turgor is normal. There are no pathologic skin lesions.  NEUROLOGIC:  Motor and sensation is grossly normal.  Cranial nerves are grossly intact. PSYCH:  Alert and oriented to person, place and time. Affect is normal.  Laboratory Analysis: Labs from 10/10/22: Na 136, K 3.8, Cl 109, CO2 22, BUN 12, Cr 0.59.  LFTs within normal limits.  WBC 7.7, Hgb 7.5, Hct 26.8, Plt 249.    Left breast/axillary biopsy on 10/02/22: Diagnosis 1. Breast, left, needle core biopsy, 8 o'clock 6 cmfn, heart clip - INVASIVE MAMMARY CARCINOMA, NO SPECIAL TYPE. - TUBULE FORMATION: SCORE 3 - NUCLEAR PLEOMORPHISM: SCORE 3 - MITOTIC COUNT: SCORE 3 - TOTAL SCORE: 9 - OVERALL GRADE: 3 - LYMPHOVASCULAR INVASION: NOT IDENTIFIED - CANCER LENGTH: 11 MM - CALCIFICATIONS: NOT IDENTIFIED - DUCTAL CARCINOMA IN SITU: PRESENT, HIGH-GRADE - SEE NOTE 2. Lymph node, needle/core biopsy, left axillary, hydromark - LYMPH NODE WITH REACTIVE CHANGES; NEGATIVE FOR MALIGNANCY.  Imaging: Mammogram/US on 09/23/22: FINDINGS: Underlying the palpable marker within the inner left breast is a small partially obscured mass. No additional masses, calcifications or distortion identified within either breast. Stable position biopsy clip right breast.   On physical exam, there is a small palpable mass lower inner left breast   Targeted ultrasound is performed, showing a 16 x 13 x 26 mm irregular hypoechoic mass left breast 8 o'clock position 6 cm from nipple at the site of concern.   There is a mildly thickened left axillary lymph node with a cortex of 5 mm.   IMPRESSION: Suspicious palpable left breast mass 8 o'clock position.   Cortically thickened left axillary lymph node.   RECOMMENDATION: Ultrasound guided core needle biopsy palpable left breast mass 8 o'clock  position.   Ultrasound-guided core needle biopsy cortically thickened left axillary lymph node.   I have discussed the findings and recommendations with the patient. If applicable, a reminder letter will be sent to the patient regarding the next appointment.   BI-RADS CATEGORY  4: Suspicious.  Assessment and Plan: This is a 37 y.o. female with triple negative left breast cancer.  --Discussed with the patient the results of her imaging and biopsy.  Discussed with Dr. Cathie Hoops about her case and she's currently planning on doing neoadjuvant chemotherapy, and the patient would need a port-a-cath placement first.  Discussed with the patient the rationale for using a port-a-cath instead of a regular peripheral IV or a PICC line.  Discussed how port-a-caths work and how they are subcutaneous in location, inserting into either subclavian or internal jugular vein.  She's in agreement to proceed --Discussed with her then the plan for a right sided port-a-cath insertion.  Reviewed the surgery at length with her including the planned location/incision, risks of bleeding, infection, injury to surrounding structures including pneumothorax, trying to use  the subclavian vein followed by internal jugular, use of imaging intra-op, that this would be an outpatient surgery, post-operative activity restrictions, pain control, and she's willing to proceed. --Will schedule her for surgery on 10/17/22.  All of her questions have been answered.   I spent 40 minutes dedicated to the care of this patient on the date of this encounter to include pre-visit review of records, face-to-face time with the patient discussing diagnosis and management, and any post-visit coordination of care.   Howie Ill, MD [facility]

## 2022-10-11 NOTE — Telephone Encounter (Signed)
Per Dr. Cathie Hoops [place] message : "iron panel showed IDA. please arrange her to get IV venofer x 2 dose next week, 48 hours apart, followed by 2 doses during week of 9/9. "  Per scheduling there is only room to accomodate 1 infusion next week due to limited chair availabiltiy. Advised to schedule 1 infusion next week, 2 infusions the week of 9/9 and 1 the week of 9/16.   Patient informed of plan and verbalied understanding. She has been contacted with appts.

## 2022-10-11 NOTE — Progress Notes (Signed)
10/11/2022  Reason for Visit:  Triple negative left breast cancer  Requesting Provider:  Shane Crutch, PA-C  History of Present Illness: Penny Gomez is a 37 y.o. female presenting for evaluation of left breast cancer.  She had a palpable mass and pain in the left breast in the lower inner quadrant.  She had a mammogram and ultrasound on 09/23/22 which showed a 1.6 x 1.3 x 2.6 cm mass in the left breast at 8 o'clock, 6 CMFN, as well as a mildly thickened left axillary lymph node.  Both were biopsied on 10/02/22.  Pathology resulted in invasive breast cancer, and is ER/PR/Her2 negative.  She has seen Dr. Cathie Hoops with Oncology and currently she's planning on starting neoadjuvant chemotherapy.  Breast MRI is currently pending to evaluate extent of disease, particularly given the breast density seen on mammogram.  Patient today is doing well.  She reports a strong family history of breast cancer, with cancer in her mom also at a young age, two aunts and two cousins.  She has an appointment with Ms. Cowan on 9/18 for genetic counseling.  Menses started at age 75, had 3 pregnancies including 1 miscarriage, no breastfeeding, and no prior hormone therapy or birth control.  She's had a prior right breast biopsy in 2021 which showed benign breast tissue.  Past Medical History: Past Medical History:  Diagnosis Date   Breast lump    Fibroid, uterine      Past Surgical History: Past Surgical History:  Procedure Laterality Date   BREAST BIOPSY Right 05/20/2019   Affirm Bx- X- clip, neg   BREAST BIOPSY Left 10/02/2022   [place] Bx, path pending   BREAST BIOPSY Left 10/02/2022   [place] Bx Node- path pending   BREAST BIOPSY Left 10/02/2022   [place] LT BREAST BX W LOC DEV 1ST LESION IMG BX SPEC US GUIDE 10/02/2022 [facility]   CESAREAN SECTION     LAPAROSCOPIC BILATERAL SALPINGECTOMY Bilateral 02/09/2022   Procedure: LAPAROSCOPIC BILATERAL SALPINGECTOMY;  Surgeon: Hildred Laser, MD;  Location: [facility] ORS;  Service:  Gynecology;  Laterality: Bilateral;   TUBAL LIGATION      Home Medications: Prior to Admission medications   Not on File    Allergies: No Known Allergies  Social History:  reports that she has never smoked. She has never used smokeless tobacco. She reports current alcohol use. She reports that she does not use drugs.   Family History: Family History  Problem Relation Age of Onset   Breast cancer Mother 39   Cancer Maternal Grandfather    Pancreatic cancer Maternal Grandfather    Breast cancer Maternal Aunt 33   Cancer Maternal Uncle    Breast cancer Cousin 36   Cancer Other     Review of Systems: Review of Systems  Constitutional:  Negative for chills and fever.  HENT:  Negative for hearing loss.   Respiratory:  Negative for shortness of breath.   Cardiovascular:  Negative for chest pain.  Gastrointestinal:  Negative for abdominal pain, nausea and vomiting.  Genitourinary:  Negative for dysuria.  Musculoskeletal:  Negative for myalgias.  Skin:  Negative for rash.  Neurological:  Negative for dizziness.  Psychiatric/Behavioral:  Negative for depression.     Physical Exam BP 119/74   Pulse 96   Temp 99.1 F (37.3 C) (Oral)   Ht 5' (1.524 m)   Wt 135 lb 6.4 oz (61.4 kg)   LMP 09/13/2022 (Exact Date)   SpO2 100%   BMI 26.44 kg/m  CONSTITUTIONAL: No acute distress, well nourished. HEENT:  Normocephalic, atraumatic, extraocular motion intact. NECK: Trachea is midline, and there is no jugular venous distension.  RESPIRATORY:  Lungs are clear, and breath sounds are equal bilaterally. Normal respiratory effort without pathologic use of accessory muscles. CARDIOVASCULAR: Heart is regular without murmurs, gallops, or rubs. BREAST:  Left breast with a small area of palpable abnormality at 8 o'clock position, consistent with mammogram finding.  No new masses, no nipple discharge.  No left axillary lymphadenopathy.  Right breast without palpable masses, skin changes, or  nipple changes.  No right axillary lymphadenopathy. MUSCULOSKELETAL:  Normal muscle strength and tone in all four extremities.  No peripheral edema or cyanosis. SKIN: Skin turgor is normal. There are no pathologic skin lesions.  NEUROLOGIC:  Motor and sensation is grossly normal.  Cranial nerves are grossly intact. PSYCH:  Alert and oriented to person, place and time. Affect is normal.  Laboratory Analysis: Labs from 10/10/22: Na 136, K 3.8, Cl 109, CO2 22, BUN 12, Cr 0.59.  LFTs within normal limits.  WBC 7.7, Hgb 7.5, Hct 26.8, Plt 249.    Left breast/axillary biopsy on 10/02/22: Diagnosis 1. Breast, left, needle core biopsy, 8 o'clock 6 cmfn, heart clip - INVASIVE MAMMARY CARCINOMA, NO SPECIAL TYPE. - TUBULE FORMATION: SCORE 3 - NUCLEAR PLEOMORPHISM: SCORE 3 - MITOTIC COUNT: SCORE 3 - TOTAL SCORE: 9 - OVERALL GRADE: 3 - LYMPHOVASCULAR INVASION: NOT IDENTIFIED - CANCER LENGTH: 11 MM - CALCIFICATIONS: NOT IDENTIFIED - DUCTAL CARCINOMA IN SITU: PRESENT, HIGH-GRADE - SEE NOTE 2. Lymph node, needle/core biopsy, left axillary, hydromark - LYMPH NODE WITH REACTIVE CHANGES; NEGATIVE FOR MALIGNANCY.  Imaging: Mammogram/US on 09/23/22: FINDINGS: Underlying the palpable marker within the inner left breast is a small partially obscured mass. No additional masses, calcifications or distortion identified within either breast. Stable position biopsy clip right breast.   On physical exam, there is a small palpable mass lower inner left breast   Targeted ultrasound is performed, showing a 16 x 13 x 26 mm irregular hypoechoic mass left breast 8 o'clock position 6 cm from nipple at the site of concern.   There is a mildly thickened left axillary lymph node with a cortex of 5 mm.   IMPRESSION: Suspicious palpable left breast mass 8 o'clock position.   Cortically thickened left axillary lymph node.   RECOMMENDATION: Ultrasound guided core needle biopsy palpable left breast mass 8 o'clock  position.   Ultrasound-guided core needle biopsy cortically thickened left axillary lymph node.   I have discussed the findings and recommendations with the patient. If applicable, a reminder letter will be sent to the patient regarding the next appointment.   BI-RADS CATEGORY  4: Suspicious.  Assessment and Plan: This is a 37 y.o. female with triple negative left breast cancer.  --Discussed with the patient the results of her imaging and biopsy.  Discussed with Dr. Cathie Hoops about her case and she's currently planning on doing neoadjuvant chemotherapy, and the patient would need a port-a-cath placement first.  Discussed with the patient the rationale for using a port-a-cath instead of a regular peripheral IV or a PICC line.  Discussed how port-a-caths work and how they are subcutaneous in location, inserting into either subclavian or internal jugular vein.  She's in agreement to proceed --Discussed with her then the plan for a right sided port-a-cath insertion.  Reviewed the surgery at length with her including the planned location/incision, risks of bleeding, infection, injury to surrounding structures including pneumothorax, trying to use  the subclavian vein followed by internal jugular, use of imaging intra-op, that this would be an outpatient surgery, post-operative activity restrictions, pain control, and she's willing to proceed. --Will schedule her for surgery on 10/17/22.  All of her questions have been answered.   I spent 40 minutes dedicated to the care of this patient on the date of this encounter to include pre-visit review of records, face-to-face time with the patient discussing diagnosis and management, and any post-visit coordination of care.   Howie Ill, MD [facility]

## 2022-10-13 ENCOUNTER — Encounter: Payer: Self-pay | Admitting: Oncology

## 2022-10-15 ENCOUNTER — Inpatient Hospital Stay: Payer: Medicaid Other

## 2022-10-15 ENCOUNTER — Inpatient Hospital Stay: Payer: Medicaid Other | Attending: Oncology

## 2022-10-15 ENCOUNTER — Telehealth: Payer: Self-pay | Admitting: Surgery

## 2022-10-15 ENCOUNTER — Telehealth: Payer: Self-pay

## 2022-10-15 VITALS — BP 113/64 | HR 62 | Temp 97.6°F | Resp 16

## 2022-10-15 DIAGNOSIS — Z23 Encounter for immunization: Secondary | ICD-10-CM | POA: Diagnosis not present

## 2022-10-15 DIAGNOSIS — D509 Iron deficiency anemia, unspecified: Secondary | ICD-10-CM | POA: Insufficient documentation

## 2022-10-15 DIAGNOSIS — C50312 Malignant neoplasm of lower-inner quadrant of left female breast: Secondary | ICD-10-CM | POA: Insufficient documentation

## 2022-10-15 DIAGNOSIS — K5909 Other constipation: Secondary | ICD-10-CM | POA: Diagnosis not present

## 2022-10-15 DIAGNOSIS — Z5111 Encounter for antineoplastic chemotherapy: Secondary | ICD-10-CM | POA: Insufficient documentation

## 2022-10-15 DIAGNOSIS — Z5112 Encounter for antineoplastic immunotherapy: Secondary | ICD-10-CM | POA: Diagnosis present

## 2022-10-15 DIAGNOSIS — E860 Dehydration: Secondary | ICD-10-CM | POA: Insufficient documentation

## 2022-10-15 DIAGNOSIS — Z5189 Encounter for other specified aftercare: Secondary | ICD-10-CM | POA: Diagnosis not present

## 2022-10-15 DIAGNOSIS — Z171 Estrogen receptor negative status [ER-]: Secondary | ICD-10-CM | POA: Insufficient documentation

## 2022-10-15 LAB — PREGNANCY, URINE: Preg Test, Ur: NEGATIVE

## 2022-10-15 MED ORDER — SODIUM CHLORIDE 0.9 % IV SOLN
Freq: Once | INTRAVENOUS | Status: AC
  Filled 2022-10-15: qty 250

## 2022-10-15 MED ORDER — SODIUM CHLORIDE 0.9 % IV SOLN
200.0000 mg | Freq: Once | INTRAVENOUS | Status: AC
  Administered 2022-10-15: 200 mg via INTRAVENOUS
  Filled 2022-10-15: qty 200

## 2022-10-15 NOTE — Telephone Encounter (Signed)
Please add lab (urine preg test) today, prior to getting iron infusion.

## 2022-10-15 NOTE — Patient Instructions (Signed)
 Iron Sucrose Injection What is this medication? IRON SUCROSE (EYE ern SOO krose) treats low levels of iron (iron deficiency anemia) in people with kidney disease. Iron is a mineral that plays an important role in making red blood cells, which carry oxygen from your lungs to the rest of your body. This medicine may be used for other purposes; ask your health care provider or pharmacist if you have questions. COMMON BRAND NAME(S): Venofer What should I tell my care team before I take this medication? They need to know if you have any of these conditions: Anemia not caused by low iron levels Heart disease High levels of iron in the blood Kidney disease Liver disease An unusual or allergic reaction to iron, other medications, foods, dyes, or preservatives Pregnant or trying to get pregnant Breastfeeding How should I use this medication? This medication is for infusion into a vein. It is given in a hospital or clinic setting. Talk to your care team about the use of this medication in children. While this medication may be prescribed for children as young as 2 years for selected conditions, precautions do apply. Overdosage: If you think you have taken too much of this medicine contact a poison control center or emergency room at once. NOTE: This medicine is only for you. Do not share this medicine with others. What if I miss a dose? Keep appointments for follow-up doses. It is important not to miss your dose. Call your care team if you are unable to keep an appointment. What may interact with this medication? Do not take this medication with any of the following: Deferoxamine Dimercaprol Other iron products This medication may also interact with the following: Chloramphenicol Deferasirox This list may not describe all possible interactions. Give your health care provider a list of all the medicines, herbs, non-prescription drugs, or dietary supplements you use. Also tell them if you smoke,  drink alcohol, or use illegal drugs. Some items may interact with your medicine. What should I watch for while using this medication? Visit your care team regularly. Tell your care team if your symptoms do not start to get better or if they get worse. You may need blood work done while you are taking this medication. You may need to follow a special diet. Talk to your care team. Foods that contain iron include: whole grains/cereals, dried fruits, beans, or peas, leafy green vegetables, and organ meats (liver, kidney). What side effects may I notice from receiving this medication? Side effects that you should report to your care team as soon as possible: Allergic reactions--skin rash, itching, hives, swelling of the face, lips, tongue, or throat Low blood pressure--dizziness, feeling faint or lightheaded, blurry vision Shortness of breath Side effects that usually do not require medical attention (report to your care team if they continue or are bothersome): Flushing Headache Joint pain Muscle pain Nausea Pain, redness, or irritation at injection site This list may not describe all possible side effects. Call your doctor for medical advice about side effects. You may report side effects to FDA at 1-800-FDA-1088. Where should I keep my medication? This medication is given in a hospital or clinic. It will not be stored at home. NOTE: This sheet is a summary. It may not cover all possible information. If you have questions about this medicine, talk to your doctor, pharmacist, or health care provider.  2024 Elsevier/Gold Standard (2022-07-05 00:00:00)

## 2022-10-15 NOTE — Telephone Encounter (Signed)
Left message for patient to call, please inform her of the following regarding scheduled surgery with Dr. Aleen Campi.   Pre-Admission date/time, and Surgery date at [facility].  Surgery Date: 10/17/22 Preadmission Testing Date: 10/16/22 (phone 1p-4p)  Also patient will need to call at [phone], between 1-3:00pm the day before surgery, to find out what time to arrive for surgery.

## 2022-10-15 NOTE — Telephone Encounter (Signed)
-----   Message from [name] sent at 10/11/2022  9:49 PM EDT ----- Please add a lab encounter before her 9/3 iron infusion to check urine pregnancy test. Thanks.

## 2022-10-15 NOTE — Telephone Encounter (Signed)
Patient calls back, she is now aware of all dates regarding surgery.

## 2022-10-15 NOTE — Telephone Encounter (Signed)
[employer] [place]Medicaid Expansion) approved on 10/11/2022, MID: [id number], Effective dates 09/12/2022-09/11/2022. Patient informed on 10/11/2022.

## 2022-10-16 ENCOUNTER — Encounter
Admission: RE | Admit: 2022-10-16 | Discharge: 2022-10-16 | Disposition: A | Discharge status: Home / Self Care | Payer: Medicaid Other | Source: Ambulatory Visit | Attending: Surgery | Admitting: Surgery

## 2022-10-16 ENCOUNTER — Encounter: Payer: Self-pay | Admitting: Urgent Care

## 2022-10-16 ENCOUNTER — Inpatient Hospital Stay: Payer: Medicaid Other | Admitting: Occupational Therapy

## 2022-10-16 ENCOUNTER — Other Ambulatory Visit: Payer: Self-pay

## 2022-10-16 DIAGNOSIS — Z01812 Encounter for preprocedural laboratory examination: Secondary | ICD-10-CM

## 2022-10-16 DIAGNOSIS — Z171 Estrogen receptor negative status [ER-]: Secondary | ICD-10-CM | POA: Diagnosis not present

## 2022-10-16 DIAGNOSIS — Z01818 Encounter for other preprocedural examination: Secondary | ICD-10-CM | POA: Diagnosis present

## 2022-10-16 DIAGNOSIS — D509 Iron deficiency anemia, unspecified: Secondary | ICD-10-CM | POA: Insufficient documentation

## 2022-10-16 DIAGNOSIS — C50912 Malignant neoplasm of unspecified site of left female breast: Secondary | ICD-10-CM | POA: Diagnosis not present

## 2022-10-16 DIAGNOSIS — C50919 Malignant neoplasm of unspecified site of unspecified female breast: Secondary | ICD-10-CM

## 2022-10-16 HISTORY — DX: Iron deficiency anemia, unspecified: D50.9

## 2022-10-16 HISTORY — DX: Hormone receptor negative with human epidermal growth factor receptor 2 negative status: Z17.421

## 2022-10-16 HISTORY — DX: Unspecified sexually transmitted disease: A64

## 2022-10-16 HISTORY — DX: Malignant neoplasm of unspecified site of unspecified female breast: C50.919

## 2022-10-16 LAB — CBC
HCT: 24.2 % — ABNORMAL LOW (ref 36.0–46.0)
Hemoglobin: 6.9 g/dL — ABNORMAL LOW (ref 12.0–15.0)
MCH: 18.6 pg — ABNORMAL LOW (ref 26.0–34.0)
MCHC: 28.5 g/dL — ABNORMAL LOW (ref 30.0–36.0)
MCV: 65.4 fL — ABNORMAL LOW (ref 80.0–100.0)
Platelets: 370 10*3/uL (ref 150–400)
RBC: 3.7 MIL/uL — ABNORMAL LOW (ref 3.87–5.11)
RDW: 22.3 % — ABNORMAL HIGH (ref 11.5–15.5)
WBC: 7.2 10*3/uL (ref 4.0–10.5)
nRBC: 0 % (ref 0.0–0.2)

## 2022-10-16 LAB — PREPARE RBC (CROSSMATCH)

## 2022-10-16 NOTE — Progress Notes (Addendum)
[facility] Perioperative Services: Pre-Admission/Anesthesia Testing  Abnormal Lab Notification   Date: 10/16/22  Name: Penny Gomez MRN:   [id number]  Re: Abnormal labs noted during PAT appointment   Notified:    Provider Name Provider Role Notification Mode  Henrene Dodge, MD General Surgery Routed and/or faxed via Gaspar Skeeters, MD Hematology / Oncology Routed and/or faxed via [facility]   ABNORMAL LAB VALUE(S):   Lab Results  Component Value Date   HGB 6.9 (L) 10/16/2022   HCT 24.2 (L) 10/16/2022   MCHC 28.5 (L) 10/16/2022   RDW 22.3 (H) 10/16/2022   Clinical Information and Notes:  [name] recently diagnosed with stage IB triple negative LEFT breast cancer. Plans are for neoadjuvant antineoplastic chemotherapy using dose dense doxorubicin + cyclophosphamide q14d x 4 cycles with q7d paclitaxel. In order for her to receive systemic treatment, patient will require placement of an access device (port-a-cath) for administration. Patient has met with general surgery and the plans are for her to undergo access device placement on 10/17/2022 with Dr. Henrene Dodge.   In review of her recent labs, patient noted to be anemic. She is currently receiving intravenous iron infusions in the [facility] as directed by Dr. [name], MD. In efforts to ensure that we have a clear picture of patient's hemodynamic status going into the procedure, she was brought back in to (10/15/2022) for repeat CBC, as previous Hgb level 7.5 g/dL.   Repeat CBC obtained. Patient reporting to be fatigued, weak, and short of breath. I had  lab obtain type and screen in anticipation of potential PRBC transfusion prior to surgery. CBC returned with Hgb even lower at 6.9 g/dL.   Impression and Plan:  DAMA BOGGAN scheduled for port placement tomorrow. In review of the labs, and in the setting of patient being symptomatic, recommending preoperative blood transfusion. Patient  will need at least 1 unit of PRBCs.   Will communicate results to surgeon and hematology to make them aware of results. Again, type and screen obtained today and is active. Based on follow up communication with patient's surgeon, I will place orders and arrange for patient to come in earlier for blood prior to her procedure.   ADDENDUM 10/19/2022 at 1530 PM: Called patient to discuss results. She reports the aforementioned symptoms. (+) BRBPR at times, however patient advises that she is frequently constipated. She has never had a colonoscopy. In the setting of her upcoming port placement tomorrow, followed by initiation of treatment for her newly diagnosed breast malignancy, efforts being made to optimize patient prior to surgery. Discussed indication for PRBC transfusions; questions fielded to patient satisfaction. Confirmed with oncology that patient had no special hematological needs due to her malignancy (irradiated cells); Dr. Cathie Hoops confirmed that blood did not need to be irradiated.  Spoke with SDS charge nurse to obtain arrival time (10 am). Patient has appointments for imaging and "some cardiology visit" tomorrow at 9 and 10 am. Patient to present to SDS immediately after appointments to begin blood transfusion. Orders haven been entered and prepare has been released. Will update surgeon on plan of care.   ADDENDUM 10/18/2022 AT 0627 AM: Reviewed post transfusion labs. Hgb increased to 8.6 g/dL s/p 1 unit of PRBCs. As previously mentioned, patient continues on IV iron treatments in the cancer center. She will receive ongoing optimization of her anemia and treatment for her malignancy in the cancer center with Dr. Cathie Hoops. Appreciate the opportunity to participate  in the care of this young woman. No further needs from my area this time.   Quentin Mulling, MSN, APRN, FNP-C, CEN [facility]  Peri-operative Services Nurse Practitioner Phone: [phone] 10/16/22 1:31 PM  NOTE: This note  has been prepared using Dragon dictation software. Despite my best ability to proofread, there is always the potential that unintentional transcriptional errors may still occur from this process.

## 2022-10-16 NOTE — Patient Instructions (Addendum)
Your procedure is scheduled on: 10/17/2022 Thursday  Report to the Registration Desk on the 1st floor of the Medical [facility]. To find out your arrival time, please call [phone] between 1PM - 3PM on: 10/16/2022 Wednesday  If your arrival time is 6:00 am, do not arrive before that time as the Medical Mall entrance doors do not open until 6:00 am.  REMEMBER: Instructions that are not followed completely may result in serious medical risk, up to and including death; or upon the discretion of your surgeon and anesthesiologist your surgery may need to be rescheduled.  Do not eat food after midnight the night before surgery.  No gum chewing or hard candies.  You may however, drink CLEAR liquids up to 2 hours before you are scheduled to arrive for your surgery. Do not drink anything within 2 hours of your scheduled arrival time.  Clear liquids include: - water  - apple juice without pulp - gatorade (not RED colors) - black coffee or tea (Do NOT add milk or creamers to the coffee or tea) Do NOT drink anything that is not on this list.   One week prior to surgery: Stop Anti-inflammatories (NSAIDS) such as Advil, Aleve, Ibuprofen, Motrin, Naproxen, Naprosyn and Aspirin based products such as Excedrin, Goody's Powder, BC Powder. Stop ANY OVER THE COUNTER supplements until after surgery. You may however, continue to take Tylenol if needed for pain up until the day of surgery.    No Alcohol for 24 hours before or after surgery.  No Smoking including e-cigarettes for 24 hours before surgery.  No chewable tobacco products for at least 6 hours before surgery.  No nicotine patches on the day of surgery.  Do not use any "recreational" drugs for at least a week (preferably 2 weeks) before your surgery.  Please be advised that the combination of cocaine and anesthesia may have negative outcomes, up to and including death. If you test positive for cocaine, your surgery will be cancelled.  On the  morning of surgery brush your teeth with toothpaste and water, you may rinse your mouth with mouthwash if you wish. Do not swallow any toothpaste or mouthwash.  Use CHG Soap or wipes as directed on instruction sheet.-provided for you   Do not wear jewelry, make-up, hairpins, clips or nail polish.  Do not wear lotions, powders, or perfumes.   Do not shave body hair from the neck down 48 hours before surgery.  Contact lenses, hearing aids and dentures may not be worn into surgery.  Do not bring valuables to the hospital. [facility] is not responsible for any missing/lost belongings or valuables.    Notify your doctor if there is any change in your medical condition (cold, fever, infection).  Wear comfortable clothing (specific to your surgery type) to the hospital.  After surgery, you can help prevent lung complications by doing breathing exercises.  Take deep breaths and cough every 1-2 hours. Your doctor may order a device called an Incentive Spirometer to help you take deep breaths.  If you are being discharged the day of surgery, you will not be allowed to drive home. You will need a responsible individual to drive you home and stay with you for 24 hours after surgery.    Please call the Pre-admissions Testing Dept. at [phone] if you have any questions about these instructions.  Surgery Visitation Policy:  Patients having surgery or a procedure may have two visitors.  Children under the age of 18 must have an  adult with them who is not the patient.

## 2022-10-17 ENCOUNTER — Encounter: Payer: Self-pay | Admitting: Emergency Medicine

## 2022-10-17 ENCOUNTER — Ambulatory Visit: Payer: Medicaid Other

## 2022-10-17 ENCOUNTER — Encounter
Admission: RE | Disposition: A | Discharge status: Home / Self Care | Payer: Self-pay | Source: Home / Self Care | Attending: Surgery

## 2022-10-17 ENCOUNTER — Ambulatory Visit: Payer: Medicaid Other | Admitting: Urgent Care

## 2022-10-17 ENCOUNTER — Other Ambulatory Visit: Payer: Self-pay

## 2022-10-17 ENCOUNTER — Ambulatory Visit (HOSPITAL_BASED_OUTPATIENT_CLINIC_OR_DEPARTMENT_OTHER)
Admission: RE | Admit: 2022-10-17 | Discharge: 2022-10-17 | Disposition: A | Discharge status: Home / Self Care | Payer: Medicaid Other | Source: Ambulatory Visit | Attending: Oncology | Admitting: Oncology

## 2022-10-17 ENCOUNTER — Ambulatory Visit
Admission: RE | Admit: 2022-10-17 | Discharge: 2022-10-17 | Disposition: A | Discharge status: Home / Self Care | Payer: Medicaid Other | Source: Ambulatory Visit | Attending: Oncology | Admitting: Oncology

## 2022-10-17 ENCOUNTER — Encounter: Payer: Self-pay | Admitting: Surgery

## 2022-10-17 ENCOUNTER — Ambulatory Visit
Admission: RE | Admit: 2022-10-17 | Discharge: 2022-10-17 | Disposition: A | Discharge status: Home / Self Care | Payer: Medicaid Other | Attending: Surgery | Admitting: Surgery

## 2022-10-17 DIAGNOSIS — D649 Anemia, unspecified: Secondary | ICD-10-CM

## 2022-10-17 DIAGNOSIS — Z79899 Other long term (current) drug therapy: Secondary | ICD-10-CM | POA: Insufficient documentation

## 2022-10-17 DIAGNOSIS — C50919 Malignant neoplasm of unspecified site of unspecified female breast: Secondary | ICD-10-CM | POA: Insufficient documentation

## 2022-10-17 DIAGNOSIS — N644 Mastodynia: Secondary | ICD-10-CM | POA: Diagnosis not present

## 2022-10-17 DIAGNOSIS — C50312 Malignant neoplasm of lower-inner quadrant of left female breast: Secondary | ICD-10-CM | POA: Diagnosis present

## 2022-10-17 DIAGNOSIS — Z803 Family history of malignant neoplasm of breast: Secondary | ICD-10-CM | POA: Diagnosis not present

## 2022-10-17 DIAGNOSIS — Z5181 Encounter for therapeutic drug level monitoring: Secondary | ICD-10-CM | POA: Insufficient documentation

## 2022-10-17 DIAGNOSIS — Z171 Estrogen receptor negative status [ER-]: Secondary | ICD-10-CM | POA: Insufficient documentation

## 2022-10-17 DIAGNOSIS — D509 Iron deficiency anemia, unspecified: Secondary | ICD-10-CM

## 2022-10-17 DIAGNOSIS — Z01812 Encounter for preprocedural laboratory examination: Secondary | ICD-10-CM

## 2022-10-17 HISTORY — PX: PORTACATH PLACEMENT: SHX2246

## 2022-10-17 LAB — ECHOCARDIOGRAM COMPLETE
AR max vel: 2.94 cm2
AV Area VTI: 3.13 cm2
AV Area mean vel: 3.05 cm2
AV Mean grad: 3 mmHg
AV Peak grad: 5.7 mmHg
Ao pk vel: 1.19 m/s
Area-P 1/2: 3.05 cm2
MV VTI: 2.47 cm2
S' Lateral: 2.4 cm

## 2022-10-17 LAB — CBC
HCT: 30.1 % — ABNORMAL LOW (ref 36.0–46.0)
Hemoglobin: 8.6 g/dL — ABNORMAL LOW (ref 12.0–15.0)
MCH: 19.5 pg — ABNORMAL LOW (ref 26.0–34.0)
MCHC: 28.6 g/dL — ABNORMAL LOW (ref 30.0–36.0)
MCV: 68.1 fL — ABNORMAL LOW (ref 80.0–100.0)
Platelets: 371 10*3/uL (ref 150–400)
RBC: 4.42 MIL/uL (ref 3.87–5.11)
RDW: 22.4 % — ABNORMAL HIGH (ref 11.5–15.5)
WBC: 8 10*3/uL (ref 4.0–10.5)
nRBC: 0 % (ref 0.0–0.2)

## 2022-10-17 LAB — POCT PREGNANCY, URINE: Preg Test, Ur: NEGATIVE

## 2022-10-17 SURGERY — INSERTION, TUNNELED CENTRAL VENOUS DEVICE, WITH PORT
Anesthesia: General | Laterality: Right

## 2022-10-17 MED ORDER — OXYCODONE HCL 5 MG PO TABS: 5.0000 mg | ORAL_TABLET | Freq: Four times a day (QID) | ORAL | 0 refills | Status: DC | PRN

## 2022-10-17 MED ORDER — ACETAMINOPHEN 500 MG PO TABS
1000.0000 mg | ORAL_TABLET | ORAL | Status: AC
  Administered 2022-10-17: 1000 mg via ORAL

## 2022-10-17 MED ORDER — LIDOCAINE HCL (PF) 1 % IJ SOLN
INTRAMUSCULAR | Status: AC
  Filled 2022-10-17: qty 30

## 2022-10-17 MED ORDER — ORAL CARE MOUTH RINSE: 15.0000 mL | Freq: Once | OROMUCOSAL | Status: AC

## 2022-10-17 MED ORDER — BUPIVACAINE HCL (PF) 0.5 % IJ SOLN
INTRAMUSCULAR | Status: AC
  Filled 2022-10-17: qty 30

## 2022-10-17 MED ORDER — PROPOFOL 10 MG/ML IV BOLUS
INTRAVENOUS | Status: AC
  Filled 2022-10-17: qty 40

## 2022-10-17 MED ORDER — MIDAZOLAM HCL 2 MG/2ML IJ SOLN
INTRAMUSCULAR | Status: DC | PRN
  Administered 2022-10-17: 2 mg via INTRAVENOUS

## 2022-10-17 MED ORDER — LACTATED RINGERS IV SOLN: INTRAVENOUS | Status: DC

## 2022-10-17 MED ORDER — CEFAZOLIN SODIUM-DEXTROSE 2-4 GM/100ML-% IV SOLN
INTRAVENOUS | Status: AC
  Filled 2022-10-17: qty 100

## 2022-10-17 MED ORDER — IBUPROFEN 600 MG PO TABS: 600.0000 mg | ORAL_TABLET | Freq: Three times a day (TID) | ORAL | 0 refills | Status: DC | PRN

## 2022-10-17 MED ORDER — CHLORHEXIDINE GLUCONATE CLOTH 2 % EX PADS: 6.0000 | MEDICATED_PAD | Freq: Once | CUTANEOUS | Status: DC

## 2022-10-17 MED ORDER — MIDAZOLAM HCL 2 MG/2ML IJ SOLN
INTRAMUSCULAR | Status: AC
  Filled 2022-10-17: qty 2

## 2022-10-17 MED ORDER — HEPARIN SODIUM (PORCINE) 5000 UNIT/ML IJ SOLN
INTRAMUSCULAR | Status: AC
  Filled 2022-10-17: qty 1

## 2022-10-17 MED ORDER — SODIUM CHLORIDE 0.9 % IV SOLN: 10.0000 mL/h | Freq: Once | INTRAVENOUS | Status: DC

## 2022-10-17 MED ORDER — SODIUM CHLORIDE 0.9 % IV SOLN
INTRAVENOUS | Status: DC | PRN
  Administered 2022-10-17: 50 mL via INTRAMUSCULAR

## 2022-10-17 MED ORDER — PROPOFOL 500 MG/50ML IV EMUL
INTRAVENOUS | Status: DC | PRN
  Administered 2022-10-17: 100 ug/kg/min via INTRAVENOUS

## 2022-10-17 MED ORDER — ONDANSETRON HCL 4 MG/2ML IJ SOLN
INTRAMUSCULAR | Status: AC
  Filled 2022-10-17: qty 2

## 2022-10-17 MED ORDER — SODIUM CHLORIDE (PF) 0.9 % IJ SOLN
INTRAMUSCULAR | Status: AC
  Filled 2022-10-17: qty 50

## 2022-10-17 MED ORDER — ACETAMINOPHEN 500 MG PO TABS
ORAL_TABLET | ORAL | Status: AC
  Filled 2022-10-17: qty 2

## 2022-10-17 MED ORDER — FENTANYL CITRATE (PF) 100 MCG/2ML IJ SOLN: 25.0000 ug | INTRAMUSCULAR | Status: DC | PRN

## 2022-10-17 MED ORDER — CHLORHEXIDINE GLUCONATE 0.12 % MT SOLN
15.0000 mL | Freq: Once | OROMUCOSAL | Status: AC
  Administered 2022-10-17: 15 mL via OROMUCOSAL

## 2022-10-17 MED ORDER — CHLORHEXIDINE GLUCONATE CLOTH 2 % EX PADS
6.0000 | MEDICATED_PAD | Freq: Once | CUTANEOUS | Status: AC
  Administered 2022-10-17: 6 via TOPICAL

## 2022-10-17 MED ORDER — PROPOFOL 10 MG/ML IV BOLUS
INTRAVENOUS | Status: DC | PRN
  Administered 2022-10-17: 30 mg via INTRAVENOUS
  Administered 2022-10-17: 20 mg via INTRAVENOUS
  Administered 2022-10-17: 30 mg via INTRAVENOUS

## 2022-10-17 MED ORDER — GABAPENTIN 300 MG PO CAPS
ORAL_CAPSULE | ORAL | Status: AC
  Filled 2022-10-17: qty 1

## 2022-10-17 MED ORDER — FENTANYL CITRATE (PF) 100 MCG/2ML IJ SOLN
INTRAMUSCULAR | Status: AC
  Filled 2022-10-17: qty 2

## 2022-10-17 MED ORDER — CHLORHEXIDINE GLUCONATE 0.12 % MT SOLN
OROMUCOSAL | Status: AC
  Filled 2022-10-17: qty 15

## 2022-10-17 MED ORDER — 0.9 % SODIUM CHLORIDE (POUR BTL) OPTIME
TOPICAL | Status: DC | PRN
  Administered 2022-10-17: 500 mL

## 2022-10-17 MED ORDER — GADOBUTROL 1 MMOL/ML IV SOLN
6.0000 mL | Freq: Once | INTRAVENOUS | Status: AC | PRN
  Administered 2022-10-17: 6 mL via INTRAVENOUS

## 2022-10-17 MED ORDER — FAMOTIDINE 20 MG PO TABS
20.0000 mg | ORAL_TABLET | Freq: Once | ORAL | Status: AC
  Administered 2022-10-17: 20 mg via ORAL

## 2022-10-17 MED ORDER — CEFAZOLIN SODIUM-DEXTROSE 2-4 GM/100ML-% IV SOLN
2.0000 g | INTRAVENOUS | Status: AC
  Administered 2022-10-17: 2 g via INTRAVENOUS

## 2022-10-17 MED ORDER — SODIUM CHLORIDE 0.9% IV SOLUTION: Freq: Once | INTRAVENOUS | Status: AC

## 2022-10-17 MED ORDER — FENTANYL CITRATE (PF) 100 MCG/2ML IJ SOLN
INTRAMUSCULAR | Status: DC | PRN
  Administered 2022-10-17 (×2): 25 ug via INTRAVENOUS
  Administered 2022-10-17: 50 ug via INTRAVENOUS

## 2022-10-17 MED ORDER — ACETAMINOPHEN 500 MG PO TABS: 1000.0000 mg | ORAL_TABLET | Freq: Four times a day (QID) | ORAL | Status: DC | PRN

## 2022-10-17 MED ORDER — GABAPENTIN 300 MG PO CAPS
300.0000 mg | ORAL_CAPSULE | ORAL | Status: AC
  Administered 2022-10-17: 300 mg via ORAL

## 2022-10-17 MED ORDER — LIDOCAINE HCL 1 % IJ SOLN
INTRAMUSCULAR | Status: DC | PRN
  Administered 2022-10-17: 30 mL via SURGICAL_CAVITY

## 2022-10-17 MED ORDER — FAMOTIDINE 20 MG PO TABS
ORAL_TABLET | ORAL | Status: AC
  Filled 2022-10-17: qty 1

## 2022-10-17 MED ORDER — BUPIVACAINE LIPOSOME 1.3 % IJ SUSP: 20.0000 mL | Freq: Once | INTRAMUSCULAR | Status: DC

## 2022-10-17 SURGICAL SUPPLY — 39 items
ADH SKN CLS APL DERMABOND .7 (GAUZE/BANDAGES/DRESSINGS) ×1
APL PRP STRL LF DISP 70% ISPRP (MISCELLANEOUS) ×1
BAG DECANTER FOR FLEXI CONT (MISCELLANEOUS) ×1 IMPLANT
BLADE SURG SZ11 CARB STEEL (BLADE) ×1 IMPLANT
CHLORAPREP W/TINT 26 (MISCELLANEOUS) ×1 IMPLANT
COVER LIGHT HANDLE STERIS (MISCELLANEOUS) ×2 IMPLANT
DERMABOND ADVANCED .7 DNX12 (GAUZE/BANDAGES/DRESSINGS) ×1 IMPLANT
DRAPE C-ARM XRAY 36X54 (DRAPES) ×1 IMPLANT
ELECT CAUTERY BLADE TIP 2.5 (TIP) ×1
ELECT REM PT RETURN 9FT ADLT (ELECTROSURGICAL) ×1
ELECTRODE CAUTERY BLDE TIP 2.5 (TIP) ×1 IMPLANT
ELECTRODE REM PT RTRN 9FT ADLT (ELECTROSURGICAL) ×1 IMPLANT
GAUZE 4X4 16PLY ~~LOC~~+RFID DBL (SPONGE) ×1 IMPLANT
GLOVE SURG SYN 7.0 (GLOVE) ×1
GLOVE SURG SYN 7.0 PF PI (GLOVE) ×1 IMPLANT
GLOVE SURG SYN 7.5 E (GLOVE) ×1
GLOVE SURG SYN 7.5 PF PI (GLOVE) ×1 IMPLANT
GOWN STRL REUS W/ TWL LRG LVL3 (GOWN DISPOSABLE) ×2 IMPLANT
GOWN STRL REUS W/TWL LRG LVL3 (GOWN DISPOSABLE) ×2
IV NS 500ML (IV SOLUTION) ×1
IV NS 500ML BAXH (IV SOLUTION) ×1 IMPLANT
KIT PORT POWER 8FR ISP CVUE (Port) ×1 IMPLANT
KIT TURNOVER KIT A (KITS) ×1 IMPLANT
LABEL OR SOLS (LABEL) ×1 IMPLANT
MANIFOLD NEPTUNE II (INSTRUMENTS) ×1 IMPLANT
NDL FILTER BLUNT 18X1 1/2 (NEEDLE) ×1 IMPLANT
NDL HYPO 22X1.5 SAFETY MO (MISCELLANEOUS) ×1 IMPLANT
NEEDLE FILTER BLUNT 18X1 1/2 (NEEDLE) ×1
NEEDLE HYPO 22X1.5 SAFETY MO (MISCELLANEOUS) ×1
PACK PORT-A-CATH (MISCELLANEOUS) ×1 IMPLANT
SPIKE FLUID TRANSFER (MISCELLANEOUS) ×3 IMPLANT
SUT MNCRL AB 4-0 PS2 18 (SUTURE) ×1 IMPLANT
SUT PROLENE 3 0 SH DA (SUTURE) ×1 IMPLANT
SUT VIC AB 3-0 SH 27 (SUTURE) ×1
SUT VIC AB 3-0 SH 27X BRD (SUTURE) ×1 IMPLANT
SYR 10ML LL (SYRINGE) ×1 IMPLANT
SYR 3ML LL SCALE MARK (SYRINGE) ×1 IMPLANT
TRAP FLUID SMOKE EVACUATOR (MISCELLANEOUS) ×1 IMPLANT
WATER STERILE IRR 500ML POUR (IV SOLUTION) ×1 IMPLANT

## 2022-10-17 NOTE — Progress Notes (Signed)
*  PRELIMINARY RESULTS* Echocardiogram 2D Echocardiogram has been performed.  Penny Gomez 10/17/2022, 10:41 AM

## 2022-10-17 NOTE — Op Note (Signed)
  Procedure Date:  10/17/2022  Pre-operative Diagnosis:  Left breast cancer  Post-operative Diagnosis: Left breast cancer  Procedure:  Right internal jugular  port-a-cath placement  Surgeon:  Howie Ill, MD  Anesthesia:  General endotracheal  Estimated Blood Loss:  10 ml  Specimens:  None  Complications:  None  Indications for Procedure:  This is a 37 y.o. female who requires a port-a-cath for chemotherapy.  The risks of bleeding, infection, injury to surrounding structures, thrombosis, nonfunction, pneumothorax, hemothorax, and need for further procedures were discussed with the patient and was willing to proceed.  Description of Procedure: The patient was correctly identified in the preoperative area and brought into the operating room.  The patient was placed supine with VTE prophylaxis in place.  Appropriate time-outs were performed.  Anesthesia was induced and the patient was intubated.  Appropriate antibiotics were infused.  The right chest and neck were prepped and draped in usual sterile fashion. The patient was placed in Trendelenburg position.  Ultrasound was used to evaluate the right subclavian vein and it was deep and very posterior to the clavicle, and did not allow for needle placement.  Instead, the internal jugular was assessed and was very amenable to catheter.  Using ultrasound guidance, the large bore needle was placed into the internal jugular vein without difficulty and then the Seldinger wire was advanced. Fluoroscopy was utilized to confirm that the Seldinger wire was in the superior vena cava.  The introducer dilator was placed over the Seldinger wire and the wire was removed. The previously flushed catheter was placed into the introducer dilator and the peel-away sheath was removed.  Fluoroscopy was used to confirm catheter location in the superior vena cava.  An incision was made and a port pocket developed with blunt and electrocautery dissection. The  catheter was then tunneled subcutaneously to the port pocket.  The catheter was then cut to appropriate length and attached to the previously flushed port. The port was placed into the pocket. Fluoroscopy again confirmed appropriate location of the catheter in the superior vena cava and revealed no kinking of the catheter at the insertion site.  The port was secured in place with 2-0 Prolenes and flushed for function and heparin locked.  Local anesthetic was injected into the incisions and tunnel.  The wound was closed with interrupted 3-0 Vicryl followed by 4-0 subcuticular Monocryl sutures and sealed with DermaBond.  The patient was emerged from anesthesia and extubated and brought to the recovery room for further management.  A chest x-ray was ordered.  The patient tolerated the procedure well and all counts were correct at the end of the case.   Howie Ill, MD

## 2022-10-17 NOTE — Interval H&P Note (Signed)
History and Physical Interval Note:  10/17/2022 4:25 PM  Penny Gomez  has presented today for surgery, with the diagnosis of Left Breast Cancer.  The various methods of treatment have been discussed with the patient and family. After consideration of risks, benefits and other options for treatment, the patient has consented to  Procedure(s): INSERTION PORT-A-CATH (Right) as a surgical intervention.  The patient's history has been reviewed, patient examined, no change in status, stable for surgery.  I have reviewed the patient's chart and labs.  Questions were answered to the patient's satisfaction.     Kayonna Lawniczak

## 2022-10-17 NOTE — Progress Notes (Signed)
Pharmacist Chemotherapy Monitoring - Initial Assessment    Anticipated start date: 10/25/22   The following has been reviewed per standard work regarding the patient's treatment regimen: The patient's diagnosis, treatment plan and drug doses, and organ/hematologic function Lab orders and baseline tests specific to treatment regimen  The treatment plan start date, drug sequencing, and pre-medications Prior authorization status  Patient's documented medication list, including drug-drug interaction screen and prescriptions for anti-emetics and supportive care specific to the treatment regimen The drug concentrations, fluid compatibility, administration routes, and timing of the medications to be used The patient's access for treatment and lifetime cumulative dose history, if applicable  The patient's medication allergies and previous infusion related reactions, if applicable   Changes made to treatment plan:  N/A  Follow up needed:  N/A Check for home antiemetics on C1D1   Daylene Katayama, [facility], 10/17/2022  2:46 PM

## 2022-10-17 NOTE — Discharge Instructions (Addendum)
Discharge Instructions: 1.  Patient may shower, but do not scrub wounds heavily and dab dry only. 2.  Do not submerge wounds in pool/tub until fully healed. 3.  Do not apply ointments or hydrogen peroxide to the wounds. 4.  May apply ice packs to the wounds for comfort. 5.  Avoid strenuous activity with the right arm for two weeks 6.  Do not drive while taking narcotics for pain control.  Prior to driving, make sure you are able to rotate right and left to look at blindspots without significant pain or discomfort.    AMBULATORY SURGERY  DISCHARGE INSTRUCTIONS   The drugs that you were given will stay in your system until tomorrow so for the next 24 hours you should not:  Drive an automobile Make any legal decisions Drink any alcoholic beverage   You may resume regular meals tomorrow.  Today it is better to start with liquids and gradually work up to solid foods.  You may eat anything you prefer, but it is better to start with liquids, then soup and crackers, and gradually work up to solid foods.   Please notify your doctor immediately if you have any unusual bleeding, trouble breathing, redness and pain at the surgery site, drainage, fever, or pain not relieved by medication.    Additional Instructions:        Please contact your physician with any problems or Same Day Surgery at [phone], Monday through Friday 6 am to 4 pm, or [place] at [facility] number at [phone].

## 2022-10-17 NOTE — Transfer of Care (Signed)
Immediate Anesthesia Transfer of Care Note  Patient: Penny Gomez  Procedure(s) Performed: INSERTION PORT-A-CATH (Right)  Patient Location: PACU  Anesthesia Type:General  Level of Consciousness: drowsy  Airway & Oxygen Therapy: Patient Spontanous Breathing and Patient connected to face mask oxygen  Post-op Assessment: Report given to RN and Post -op Vital signs reviewed and stable  Post vital signs: Reviewed and stable  Last Vitals:  Vitals Value Taken Time  BP 143/78 10/17/22 1800  Temp 36.3 C 10/17/22 1800  Pulse 79 10/17/22 1805  Resp 20 10/17/22 1806  SpO2 100 % 10/17/22 1805  Vitals shown include unfiled device data.  Last Pain:  Vitals:   10/17/22 1800  TempSrc:   PainSc: 0-No pain         Complications: No notable events documented.

## 2022-10-17 NOTE — Anesthesia Preprocedure Evaluation (Signed)
Anesthesia Evaluation  Patient identified by MRN, date of birth, ID band Patient awake    Reviewed: Allergy & Precautions, NPO status , Patient's Chart, lab work & pertinent test results  History of Anesthesia Complications Negative for: history of anesthetic complications  Airway Mallampati: II  TM Distance: >3 FB Neck ROM: Full    Dental no notable dental hx.    Pulmonary neg sleep apnea, neg COPD, Patient abstained from smoking.Not current smoker   Pulmonary exam normal breath sounds clear to auscultation       Cardiovascular Exercise Tolerance: Good (-) hypertension(-) Past MI, (-) Cardiac Stents and (-) CHF Normal cardiovascular exam(-) dysrhythmias  Rhythm:Regular Rate:Normal     Neuro/Psych neg Seizures    GI/Hepatic negative GI ROS, Neg liver ROS,neg GERD  ,,  Endo/Other  negative endocrine ROSneg diabetes    Renal/GU negative Renal ROS     Musculoskeletal   Abdominal   Peds  Hematology  (+) Blood dyscrasia, anemia   Anesthesia Other Findings Past Medical History: No date: Breast lump No date: Fibroids   Reproductive/Obstetrics negative OB ROS                             Anesthesia Physical Anesthesia Plan  ASA: 1  Anesthesia Plan: General   Post-op Pain Management:    Induction: Intravenous  PONV Risk Score and Plan: 3 and Propofol infusion and TIVA  Airway Management Planned: Natural Airway and Simple Face Mask  Additional Equipment:   Intra-op Plan:   Post-operative Plan:   Informed Consent: I have reviewed the patients History and Physical, chart, labs and discussed the procedure including the risks, benefits and alternatives for the proposed anesthesia with the patient or authorized representative who has indicated his/her understanding and acceptance.     Dental Advisory Given  Plan Discussed with: Anesthesiologist, CRNA and Surgeon  Anesthesia Plan  Comments:        Anesthesia Quick Evaluation

## 2022-10-17 NOTE — Anesthesia Procedure Notes (Signed)
Procedure Name: LMA Insertion Date/Time: 10/17/2022 4:51 PM  Performed by: Malva Cogan, CRNAPre-anesthesia Checklist: Patient identified, Patient being monitored, Timeout performed, Emergency Drugs available and Suction available Patient Re-evaluated:Patient Re-evaluated prior to induction Oxygen Delivery Method: Circle system utilized Preoxygenation: Pre-oxygenation with 100% oxygen Induction Type: IV induction Ventilation: Mask ventilation without difficulty LMA: LMA inserted LMA Size: 3.0 Tube type: Oral Number of attempts: 1 Placement Confirmation: positive ETCO2 and breath sounds checked- equal and bilateral Tube secured with: Tape Dental Injury: Teeth and Oropharynx as per pre-operative assessment

## 2022-10-17 NOTE — Anesthesia Procedure Notes (Signed)
Date/Time: 10/17/2022 4:46 PM  Performed by: Malva Cogan, CRNAPre-anesthesia Checklist: Patient identified, Emergency Drugs available, Suction available, Patient being monitored and Timeout performed Patient Re-evaluated:Patient Re-evaluated prior to induction Oxygen Delivery Method: Nasal cannula Induction Type: IV induction Placement Confirmation: CO2 detector and positive ETCO2

## 2022-10-18 ENCOUNTER — Encounter: Payer: Self-pay | Admitting: Urgent Care

## 2022-10-18 ENCOUNTER — Other Ambulatory Visit: Payer: Self-pay | Admitting: Oncology

## 2022-10-18 ENCOUNTER — Other Ambulatory Visit: Payer: Self-pay

## 2022-10-18 ENCOUNTER — Encounter: Payer: Self-pay | Admitting: Surgery

## 2022-10-18 ENCOUNTER — Encounter: Payer: Self-pay | Admitting: Oncology

## 2022-10-18 DIAGNOSIS — C50919 Malignant neoplasm of unspecified site of unspecified female breast: Secondary | ICD-10-CM

## 2022-10-18 LAB — TYPE AND SCREEN
ABO/RH(D): B POS
Antibody Screen: NEGATIVE
Unit division: 0

## 2022-10-18 LAB — BPAM RBC
Blood Product Expiration Date: 202409162359
ISSUE DATE / TIME: 202409051113
Unit Type and Rh: 7300

## 2022-10-18 NOTE — Progress Notes (Signed)
DISCONTINUE ON PATHWAY REGIMEN - Breast     Cycles 1 through 4: A cycle is every 14 days:     Doxorubicin      Cyclophosphamide      Pegfilgrastim-xxxx    Cycles 5 through 16: A cycle is every 7 days:     Paclitaxel   **Always confirm dose/schedule in your pharmacy ordering system**  REASON: Other Reason PRIOR TREATMENT: [id number]: Dose-Dense AC-T (Paclitaxel Weekly) - [Doxorubicin + Cyclophosphamide q14 Days x 4 Cycles, Followed by Paclitaxel 80 mg/m2 Weekly x 12 Weeks] TREATMENT RESPONSE: N/A - Adjuvant Therapy  START ON PATHWAY REGIMEN - Breast     Cycles 1 through 4: A cycle is every 21 days:     Pembrolizumab      Paclitaxel      Carboplatin      Filgrastim-xxxx    Cycles 5 through 8: A cycle is every 21 days:     Pembrolizumab      Doxorubicin      Cyclophosphamide      Pegfilgrastim-xxxx   **Always confirm dose/schedule in your pharmacy ordering system**  Patient Characteristics: Preoperative or Nonsurgical Candidate, M0 (Clinical Staging), Up to cT4c, Any N, M0, Neoadjuvant Therapy followed by Surgery, Invasive Disease, Chemotherapy, HER2 Negative, ER Negative, Platinum Therapy Indicated and Candidate for Checkpoint Inhibitor Therapeutic Status: Preoperative or Nonsurgical Candidate, M0 (Clinical Staging) AJCC M Category: cM0 AJCC Grade: G3 ER Status: Negative (-) AJCC 8 Stage Grouping: IIIB HER2 Status: Negative (-) AJCC T Category: cT3 AJCC N Category: cN0 PR Status: Negative (-) Breast Surgical Plan: Neoadjuvant Therapy followed by Surgery Intent of Therapy: Curative Intent, Discussed with Patient

## 2022-10-19 MED ORDER — PROPOFOL 1000 MG/100ML IV EMUL
INTRAVENOUS | Status: AC
  Filled 2022-10-19: qty 100

## 2022-10-21 ENCOUNTER — Telehealth: Payer: Self-pay | Admitting: *Deleted

## 2022-10-21 ENCOUNTER — Inpatient Hospital Stay: Payer: Medicaid Other

## 2022-10-21 ENCOUNTER — Other Ambulatory Visit: Payer: Self-pay | Admitting: Oncology

## 2022-10-21 ENCOUNTER — Encounter: Payer: Self-pay | Admitting: Oncology

## 2022-10-21 DIAGNOSIS — C50919 Malignant neoplasm of unspecified site of unspecified female breast: Secondary | ICD-10-CM

## 2022-10-21 MED ORDER — PROCHLORPERAZINE MALEATE 10 MG PO TABS: 10.0000 mg | ORAL_TABLET | Freq: Four times a day (QID) | ORAL | 3 refills | Status: DC | PRN

## 2022-10-21 MED ORDER — LIDOCAINE-PRILOCAINE 2.5-2.5 % EX CREA: 1.0000 | TOPICAL_CREAM | CUTANEOUS | 11 refills | Status: DC | PRN

## 2022-10-21 MED ORDER — ONDANSETRON HCL 8 MG PO TABS: 8.0000 mg | ORAL_TABLET | ORAL | 0 refills | Status: DC

## 2022-10-21 NOTE — Progress Notes (Signed)
[facility] CSW Progress Note  Clinical Child psychotherapist returned patient's call.  She requested a letter explaining her diagnosis and treatment plan to submit with her breast cancer grant applications.  CSW provided to patient after he chemo education class today.  Patient expressed no other needs.  Dorothey Baseman, LCSW Clinical Social Worker [facility]    Patient is participating in a Managed Medicaid Plan:  Yes

## 2022-10-21 NOTE — Group Note (Deleted)

## 2022-10-21 NOTE — Progress Notes (Signed)
Enrolled [place] into the [employer]

## 2022-10-21 NOTE — Progress Notes (Signed)
Spoke with Dimples about applying for the [employer], she will turn in proof of income as soon as possible.

## 2022-10-21 NOTE — Anesthesia Postprocedure Evaluation (Signed)
Anesthesia Post Note  Patient: Penny Gomez  Procedure(s) Performed: INSERTION PORT-A-CATH (Right)  Patient location during evaluation: PACU Anesthesia Type: General Level of consciousness: awake and alert Pain management: pain level controlled Vital Signs Assessment: post-procedure vital signs reviewed and stable Respiratory status: spontaneous breathing, nonlabored ventilation, respiratory function stable and patient connected to nasal cannula oxygen Cardiovascular status: blood pressure returned to baseline and stable Postop Assessment: no apparent nausea or vomiting Anesthetic complications: no   No notable events documented.   Last Vitals:  Vitals:   10/17/22 1900 10/17/22 1919  BP: 138/67 115/81  Pulse: 78 (!) 52  Resp: 17   Temp: (!) 36.3 C 36.6 C  SpO2: 100% 100%    Last Pain:  Vitals:   10/18/22 0915  TempSrc:   PainSc: 0-No pain                 Lenard Simmer

## 2022-10-21 NOTE — Telephone Encounter (Signed)
Patient called and stated that she has a note stating that she can go back to work on 10/25/22, she thought Dr Aleen Campi I told her she can stay out another week. She had port placed on 10/17/22.  So she wants another work note putting her out for another week, just call to confirm when she wants to return to work, while on the phone she didn't have a date of return yet.

## 2022-10-22 ENCOUNTER — Inpatient Hospital Stay: Payer: Medicaid Other

## 2022-10-22 ENCOUNTER — Other Ambulatory Visit: Payer: Self-pay | Admitting: Oncology

## 2022-10-22 VITALS — BP 110/45 | HR 67 | Temp 97.0°F | Resp 18

## 2022-10-22 DIAGNOSIS — D509 Iron deficiency anemia, unspecified: Secondary | ICD-10-CM

## 2022-10-22 DIAGNOSIS — Z5112 Encounter for antineoplastic immunotherapy: Secondary | ICD-10-CM | POA: Diagnosis not present

## 2022-10-22 MED ORDER — SODIUM CHLORIDE 0.9 % IV SOLN
200.0000 mg | Freq: Once | INTRAVENOUS | Status: AC
  Administered 2022-10-22: 200 mg via INTRAVENOUS
  Filled 2022-10-22: qty 200

## 2022-10-22 MED ORDER — HEPARIN SOD (PORK) LOCK FLUSH 100 UNIT/ML IV SOLN
500.0000 [IU] | Freq: Once | INTRAVENOUS | Status: AC | PRN
  Administered 2022-10-22: 500 [IU]
  Filled 2022-10-22: qty 5

## 2022-10-22 MED ORDER — INFLUENZA VIRUS VACC SPLIT PF (FLUZONE) 0.5 ML IM SUSY
0.5000 mL | PREFILLED_SYRINGE | Freq: Once | INTRAMUSCULAR | Status: AC
  Administered 2022-10-22: 0.5 mL via INTRAMUSCULAR
  Filled 2022-10-22: qty 0.5

## 2022-10-22 MED ORDER — SODIUM CHLORIDE 0.9 % IV SOLN
Freq: Once | INTRAVENOUS | Status: AC
  Filled 2022-10-22: qty 250

## 2022-10-22 NOTE — Progress Notes (Signed)
Pharmacist Chemotherapy Monitoring - Initial Assessment    Anticipated start date: 10/25/22   The following has been reviewed per standard work regarding the patient's treatment regimen: The patient's diagnosis, treatment plan and drug doses, and organ/hematologic function Lab orders and baseline tests specific to treatment regimen  The treatment plan start date, drug sequencing, and pre-medications Prior authorization status  Patient's documented medication list, including drug-drug interaction screen and prescriptions for anti-emetics and supportive care specific to the treatment regimen The drug concentrations, fluid compatibility, administration routes, and timing of the medications to be used The patient's access for treatment and lifetime cumulative dose history, if applicable  The patient's medication allergies and previous infusion related reactions, if applicable   Changes made to treatment plan:  N/A  Follow up needed:  N/A   Ebony Hail, Pharm.D., CPP 10/22/2022@4 :10 PM

## 2022-10-23 ENCOUNTER — Encounter: Payer: Self-pay | Admitting: Oncology

## 2022-10-23 ENCOUNTER — Encounter: Payer: Self-pay | Admitting: Occupational Therapy

## 2022-10-23 ENCOUNTER — Ambulatory Visit: Payer: Medicaid Other | Attending: Oncology | Admitting: Occupational Therapy

## 2022-10-23 ENCOUNTER — Other Ambulatory Visit: Payer: Self-pay

## 2022-10-23 DIAGNOSIS — R293 Abnormal posture: Secondary | ICD-10-CM | POA: Diagnosis present

## 2022-10-23 NOTE — Therapy (Signed)
OUTPATIENT OCCUPATIONAL THERAPY BREAST CANCER BASELINE EVALUATION   Patient Name: Penny Gomez MRN: [id number] DOB:1986/01/22, 37 y.o., female Today's Date: 10/23/2022  END OF SESSION:  OT End of Session - 10/23/22 1755     Visit Number 1    Number of Visits 3    Date for OT Re-Evaluation 02/24/23    OT Start Time 0900    OT Stop Time 0931    OT Time Calculation (min) 31 min    Activity Tolerance Patient tolerated treatment well    Behavior During Therapy WFL for tasks assessed/performed             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   Past Surgical History:  Procedure Laterality Date   BREAST BIOPSY Right 05/20/2019   Affirm Bx- X- clip, neg   BREAST BIOPSY Left 10/02/2022   [place] Bx, path pending   BREAST BIOPSY Left 10/02/2022   [place] Bx Node- path pending   BREAST BIOPSY Left 10/02/2022   [place] LT BREAST BX W LOC DEV 1ST LESION IMG BX SPEC US GUIDE 10/02/2022 [facility]   CESAREAN SECTION     LAPAROSCOPIC BILATERAL SALPINGECTOMY Bilateral 02/09/2022   Procedure: LAPAROSCOPIC BILATERAL SALPINGECTOMY;  Surgeon: Hildred Laser, MD;  Location: [facility];  Service: Gynecology;  Laterality: Bilateral;   PORTACATH PLACEMENT Right 10/17/2022   Procedure: INSERTION PORT-A-CATH;  Surgeon: Henrene Dodge, MD;  Location: [facility] ORS;  Service: General;  Laterality: Right;   TUBAL LIGATION     Patient Active Problem List   Diagnosis Date Noted   Invasive carcinoma of breast (HCC) 10/10/2022   Goals of care, counseling/discussion 10/10/2022   Family history of cancer 10/10/2022   Microcytic anemia 10/10/2022   Urinary frequency 03/19/2022    PCP: Jesse Sans PA  REFERRING PROVIDER: Dr Obie Dredge DIAG: cT1cN0  THERAPY DIAG:  Abnormal posture  Rationale for Evaluation and Treatment: Rehabilitation  ONSET DATE:  09/23/22  SUBJECTIVE:                                                                                                                                                                                           SUBJECTIVE STATEMENT: Patient reports she is here today after being refer by one of her medical team for her newly diagnosed left breast cancer.   PERTINENT HISTORY:  Patient was diagnosed with left  breast cancer - plan is to have chemo first  and then surgery.  Do not know yet if lumpectomy or mastectomy. PATIENT GOALS:   reduce lymphedema risk and learn post op HEP.   PAIN:  Are you having pain? No  PRECAUTIONS: Active CA      HAND DOMINANCE: right  WEIGHT BEARING RESTRICTIONS: No  FALLS:  Has patient fallen in last 6 months? No  LIVING ENVIRONMENT: Patient lives with: family  OCCUPATION: Works at [employer].  Has a 61 and a 36 year old likes to do things around the house.     OBJECTIVE:  COGNITION: Overall cognitive status: Within functional limits for tasks assessed    POSTURE:  Forward head and rounded shoulders posture  UPPER EXTREMITY AROM/PROM:   Bill Shoulder active range of motion within normal limits   CERVICAL AROM: All within normal limits:      UPPER EXTREMITY STRENGTH: 5/5 for bilateral upper extremity at all joints  LYMPHEDEMA ASSESSMENTS:   LANDMARK RIGHT   eval  15 cm proximal to olecranon process 27.5  Olecranon process 23  15 cm proximal to ulnar styloid process 22.3  Just proximal to ulnar styloid process 13.8  Across hand at thumb web space   At base of 2nd digit   (Blank rows = not tested)  LANDMARK LEFT   eval  15 cm proximal to olecranon process 26.4  Olecranon process 22.5  15 cm proximal to ulnar styloid process 22  Just proximal to ulnar styloid process 13.5  Across hand at thumb web space   At base of 2nd digit   (Blank rows = not tested)  L-DEX LYMPHEDEMA SCREENING:  L- dex score 4.3  Risk  limb L and R hand dominant   The patient was assessed using the L-Dex machine today to produce a lymphedema index baseline score. The patient will be reassessed on a regular basis (typically every 3 months) to obtain new L-Dex scores. If the score is > 6.5 points away from his/her baseline score indicating onset of subclinical lymphedema, it will be recommended to wear a compression garment for 4 weeks, 12 hours per day and then be reassessed. If the score continues to be > 6.5 points from baseline at reassessment, we will initiate lymphedema treatment. Assessing in this manner has a 95% rate of preventing clinically significant lymphedema.     PATIENT EDUCATION:  Education details: Lymphedema risk reduction and post op shoulder/posture HEP Person educated: Patient Education method: Explanation, Demonstration, Handout Education comprehension: Patient verbalized understanding and returned demonstration  HOME EXERCISE PROGRAM: Patient was instructed today in a home exercise program today for post op shoulder range of motion. These included active assist shoulder flexion in sitting/supine, scapular retraction, wall walking/slides with shoulder abduction, and hands behind head external rotation in sitting /supine.  She was encouraged to do these twice a day, holding 3 seconds and repeating 5 times when permitted by her physician/surgeon.   ASSESSMENT:  CLINICAL IMPRESSION: Pt's multidisciplinary medical team has met to assess and determine a recommended treatment plan. She is planning to have chemotherapy first followed by lumpectomy or mastectomy.  She will benefit from a post op OT reassessment to determine needs and from L-Dex screens every 3 months for 2 years to detect subclinical lymphedema.  Pt will benefit from skilled therapeutic intervention to improve on the following deficits: Decreased knowledge of precautions and lymphedema education, impaired UE functional use, pain, decreased ROM,  postural dysfunction.   OT treatment/interventions: ADL/self-care home management, pt/family education, therapeutic exercise,manual therapy  REHAB POTENTIAL: Good  CLINICAL DECISION MAKING: Stable/uncomplicated  EVALUATION COMPLEXITY: Low   GOALS: Goals reviewed with patient? YES  LONG TERM GOALS: (STG=LTG)    Name Target Date Goal status  1 Pt will be able to verbalize understanding of pertinent lymphedema risk reduction practices relevant to her dx specifically related to skin care.  Baseline:  No knowledge 10/23/22 Achieved at eval  2 Pt will be able to return demo and/or verbalize understanding of the post op HEP related to regaining shoulder ROM. Baseline:  No knowledge 10/23/22 Achieved at eval       4 Pt will demo she has regained full shoulder ROM and function post operatively compared to baselines.  Baseline: See objective measurements taken today. 4 wks s/p Initial    PLAN:  OT FREQUENCY/DURATION: EVAL and 2 follow up appointment.   PLAN FOR NEXT SESSION: will reassess 3-4 weeks post op to determine needs.   Patient will follow up at outpatient cancer rehab 3-4 weeks following surgery.  If the patient requires occupational therapy at that time, a specific plan will be dictated and sent to the referring physician for approval. The patient was educated today on appropriate basic range of motion exercises to begin post operatively   She/he verbalized good understanding.    Occupational Therapy Information for After Breast Cancer Surgery/Treatment:  Lymphedema is a swelling condition that you may be at risk for in your arm if you have lymph nodes removed from the armpit area.  After a sentinel node biopsy, the risk is approximately 5-9% and is higher after an axillary node dissection.  There is treatment available for this condition and it is not life-threatening.  Contact your physician or occupational therapist with concerns. You may begin the 4 shoulder/posture exercises  (see additional sheet) when permitted by your physician (typically a week after surgery).  If you have drains, you may need to wait until those are removed before beginning range of motion exercises.  A general recommendation is to not lift your arms above shoulder height until drains are removed.  These exercises should be done to your tolerance and gently.  This is not a "no pain/no gain" type of recovery so listen to your body and stretch into the range of motion that you can tolerate, stopping if you have pain.  If you are having immediate reconstruction, ask your plastic surgeon about doing exercises as he or she may want you to wait. We encourage you to watch the ABC (After Breast Cancer)  video. You will learn information related to lymphedema risk, prevention and treatment and additional exercises to regain mobility following surgery.  While undergoing any medical procedure or treatment, try to avoid blood pressure being taken or needle sticks from occurring on the arm on the side of cancer.   This recommendation begins after surgery and continues for the rest of your life.  This may help reduce your risk of getting lymphedema (swelling in your arm). An excellent resource for those seeking information on lymphedema is the National Lymphedema Network's web site. It can be accessed at www.lymphnet.org If you notice swelling in your hand, arm or breast at any time following surgery (even if it is many years from now), please contact your doctor or occupational therapist to discuss this.  Lymphedema can be treated at any time but it is easier for you if it is treated early on.  If you feel like your shoulder motion is not returning to normal in a reasonable amount of time, please contact  your surgeon or occupational therapist.  [facility] [phone]. [address], [place], [place] [address]     Patient was instructed today in a home exercise program today for post op  shoulder range of motion. These included active assist shoulder flexion in sitting/supine, scapular retraction, wall slides/walking with shoulder abduction, and hands behind head external rotation.  She was encouraged to do these twice a day, holding 3 seconds and repeating 5 times when permitted by her physician.    Oletta Cohn, OTR/L,CLT 10/23/2022, 5:57 PM

## 2022-10-24 ENCOUNTER — Inpatient Hospital Stay: Payer: Medicaid Other

## 2022-10-24 VITALS — BP 104/55 | HR 72 | Temp 96.0°F | Resp 18

## 2022-10-24 DIAGNOSIS — Z5112 Encounter for antineoplastic immunotherapy: Secondary | ICD-10-CM | POA: Diagnosis not present

## 2022-10-24 DIAGNOSIS — D509 Iron deficiency anemia, unspecified: Secondary | ICD-10-CM

## 2022-10-24 MED ORDER — HEPARIN SOD (PORK) LOCK FLUSH 100 UNIT/ML IV SOLN
500.0000 [IU] | Freq: Once | INTRAVENOUS | Status: AC | PRN
  Administered 2022-10-24: 500 [IU]
  Filled 2022-10-24: qty 5

## 2022-10-24 MED ORDER — SODIUM CHLORIDE 0.9 % IV SOLN
Freq: Once | INTRAVENOUS | Status: AC
  Filled 2022-10-24: qty 250

## 2022-10-24 MED ORDER — SODIUM CHLORIDE 0.9 % IV SOLN
200.0000 mg | Freq: Once | INTRAVENOUS | Status: AC
  Administered 2022-10-24: 200 mg via INTRAVENOUS
  Filled 2022-10-24: qty 200

## 2022-10-24 MED FILL — Dexamethasone Sodium Phosphate Inj 100 MG/10ML: INTRAMUSCULAR | Qty: 1 | Status: AC

## 2022-10-24 MED FILL — Fosaprepitant Dimeglumine For IV Infusion 150 MG (Base Eq): INTRAVENOUS | Qty: 5 | Status: AC

## 2022-10-25 ENCOUNTER — Inpatient Hospital Stay: Payer: Medicaid Other

## 2022-10-25 ENCOUNTER — Encounter: Payer: Self-pay | Admitting: Oncology

## 2022-10-25 ENCOUNTER — Inpatient Hospital Stay: Payer: Medicaid Other | Admitting: Oncology

## 2022-10-25 ENCOUNTER — Ambulatory Visit: Payer: Medicaid Other

## 2022-10-25 ENCOUNTER — Inpatient Hospital Stay (HOSPITAL_BASED_OUTPATIENT_CLINIC_OR_DEPARTMENT_OTHER): Payer: Medicaid Other | Admitting: Oncology

## 2022-10-25 VITALS — BP 102/68 | HR 68 | Temp 96.2°F | Resp 16 | Wt 134.0 lb

## 2022-10-25 VITALS — BP 115/72 | HR 70 | Resp 18

## 2022-10-25 DIAGNOSIS — Z809 Family history of malignant neoplasm, unspecified: Secondary | ICD-10-CM

## 2022-10-25 DIAGNOSIS — C50919 Malignant neoplasm of unspecified site of unspecified female breast: Secondary | ICD-10-CM

## 2022-10-25 DIAGNOSIS — Z5111 Encounter for antineoplastic chemotherapy: Secondary | ICD-10-CM | POA: Diagnosis not present

## 2022-10-25 DIAGNOSIS — Z5112 Encounter for antineoplastic immunotherapy: Secondary | ICD-10-CM | POA: Diagnosis not present

## 2022-10-25 DIAGNOSIS — Z95828 Presence of other vascular implants and grafts: Secondary | ICD-10-CM | POA: Insufficient documentation

## 2022-10-25 DIAGNOSIS — D500 Iron deficiency anemia secondary to blood loss (chronic): Secondary | ICD-10-CM | POA: Diagnosis not present

## 2022-10-25 LAB — CMP (CANCER CENTER ONLY)
ALT: 12 U/L (ref 0–44)
AST: 20 U/L (ref 15–41)
Albumin: 3.8 g/dL (ref 3.5–5.0)
Alkaline Phosphatase: 75 U/L (ref 38–126)
Anion gap: 4 — ABNORMAL LOW (ref 5–15)
BUN: 9 mg/dL (ref 6–20)
CO2: 24 mmol/L (ref 22–32)
Calcium: 9 mg/dL (ref 8.9–10.3)
Chloride: 107 mmol/L (ref 98–111)
Creatinine: 0.56 mg/dL (ref 0.44–1.00)
GFR, Estimated: >60 mL/min (ref >60)
Glucose, Bld: 118 mg/dL — ABNORMAL HIGH (ref 70–99)
Potassium: 3.6 mmol/L (ref 3.5–5.1)
Sodium: 135 mmol/L (ref 135–145)
Total Bilirubin: 0.4 mg/dL (ref 0.3–1.2)
Total Protein: 7.2 g/dL (ref 6.5–8.1)

## 2022-10-25 LAB — CBC WITH DIFFERENTIAL (CANCER CENTER ONLY)
Abs Immature Granulocytes: 0.01 10*3/uL (ref 0.00–0.07)
Basophils Absolute: 0 10*3/uL (ref 0.0–0.1)
Basophils Relative: 0 %
Eosinophils Absolute: 0 10*3/uL (ref 0.0–0.5)
Eosinophils Relative: 0 %
HCT: 34.9 % — ABNORMAL LOW (ref 36.0–46.0)
Hemoglobin: 9.9 g/dL — ABNORMAL LOW (ref 12.0–15.0)
Immature Granulocytes: 0 %
Lymphocytes Relative: 26 %
Lymphs Abs: 1.4 10*3/uL (ref 0.7–4.0)
MCH: 20.5 pg — ABNORMAL LOW (ref 26.0–34.0)
MCHC: 28.4 g/dL — ABNORMAL LOW (ref 30.0–36.0)
MCV: 72.4 fL — ABNORMAL LOW (ref 80.0–100.0)
Monocytes Absolute: 0.4 10*3/uL (ref 0.1–1.0)
Monocytes Relative: 7 %
Neutro Abs: 3.5 10*3/uL (ref 1.7–7.7)
Neutrophils Relative %: 67 %
Platelet Count: 219 10*3/uL (ref 150–400)
RBC: 4.82 MIL/uL (ref 3.87–5.11)
RDW: 26.2 % — ABNORMAL HIGH (ref 11.5–15.5)
WBC Count: 5.3 10*3/uL (ref 4.0–10.5)
nRBC: 0 % (ref 0.0–0.2)

## 2022-10-25 LAB — TSH: TSH: 1.368 u[IU]/mL (ref 0.350–4.500)

## 2022-10-25 LAB — PREGNANCY, URINE: Preg Test, Ur: NEGATIVE

## 2022-10-25 MED ORDER — DIPHENHYDRAMINE HCL 50 MG/ML IJ SOLN
50.0000 mg | Freq: Once | INTRAMUSCULAR | Status: AC
  Administered 2022-10-25: 50 mg via INTRAVENOUS
  Filled 2022-10-25: qty 1

## 2022-10-25 MED ORDER — SODIUM CHLORIDE 0.9 % IV SOLN
200.0000 mg | Freq: Once | INTRAVENOUS | Status: AC
  Administered 2022-10-25: 200 mg via INTRAVENOUS
  Filled 2022-10-25: qty 200

## 2022-10-25 MED ORDER — SODIUM CHLORIDE 0.9 % IV SOLN
Freq: Once | INTRAVENOUS | Status: AC
  Filled 2022-10-25: qty 250

## 2022-10-25 MED ORDER — SODIUM CHLORIDE 0.9 % IV SOLN
596.0000 mg | Freq: Once | INTRAVENOUS | Status: AC
  Administered 2022-10-25: 600 mg via INTRAVENOUS
  Filled 2022-10-25: qty 60

## 2022-10-25 MED ORDER — SODIUM CHLORIDE 0.9 % IV SOLN
150.0000 mg | Freq: Once | INTRAVENOUS | Status: AC
  Administered 2022-10-25: 150 mg via INTRAVENOUS
  Filled 2022-10-25: qty 150

## 2022-10-25 MED ORDER — LORAZEPAM 0.5 MG PO TABS: 0.5000 mg | ORAL_TABLET | Freq: Three times a day (TID) | ORAL | 0 refills | Status: DC | PRN

## 2022-10-25 MED ORDER — FAMOTIDINE IN NACL 20-0.9 MG/50ML-% IV SOLN
20.0000 mg | Freq: Once | INTRAVENOUS | Status: AC
  Administered 2022-10-25: 20 mg via INTRAVENOUS
  Filled 2022-10-25: qty 50

## 2022-10-25 MED ORDER — SODIUM CHLORIDE 0.9 % IV SOLN
80.0000 mg/m2 | Freq: Once | INTRAVENOUS | Status: AC
  Administered 2022-10-25: 126 mg via INTRAVENOUS
  Filled 2022-10-25: qty 21

## 2022-10-25 MED ORDER — PALONOSETRON HCL INJECTION 0.25 MG/5ML
0.2500 mg | Freq: Once | INTRAVENOUS | Status: AC
  Administered 2022-10-25: 0.25 mg via INTRAVENOUS
  Filled 2022-10-25: qty 5

## 2022-10-25 MED ORDER — SODIUM CHLORIDE 0.9 % IV SOLN
10.0000 mg | Freq: Once | INTRAVENOUS | Status: AC
  Administered 2022-10-25: 10 mg via INTRAVENOUS
  Filled 2022-10-25: qty 10

## 2022-10-25 MED ORDER — HEPARIN SOD (PORK) LOCK FLUSH 100 UNIT/ML IV SOLN
500.0000 [IU] | Freq: Once | INTRAVENOUS | Status: DC | PRN
  Filled 2022-10-25: qty 5

## 2022-10-25 NOTE — Patient Instructions (Addendum)
  Take Compazine for nausea if needed until Sunday. Starting Sunday can start taking Zofran or Compazine if needed.  [place] [facility]  Discharge Instructions: Thank you for choosing [facility] to provide your oncology and hematology care.  If you have a lab appointment with the Cancer Center, please go directly to the Cancer Center and check in at the registration area.  Wear comfortable clothing and clothing appropriate for easy access to any Portacath or PICC line.   We strive to give you quality time with your provider. You may need to reschedule your appointment if you arrive late (15 or more minutes).  Arriving late affects you and other patients whose appointments are after yours.  Also, if you miss three or more appointments without notifying the office, you may be dismissed from the clinic at the provider's discretion.      For prescription refill requests, have your pharmacy contact our office and allow 72 hours for refills to be completed.    Today you received the following chemotherapy and/or immunotherapy agents Keytruda, Taxol, Paraplatin      To help prevent nausea and vomiting after your treatment, we encourage you to take your nausea medication as directed.  BELOW ARE SYMPTOMS THAT SHOULD BE REPORTED IMMEDIATELY: *FEVER GREATER THAN 100.4 F (38 C) OR HIGHER *CHILLS OR SWEATING *NAUSEA AND VOMITING THAT IS NOT CONTROLLED WITH YOUR NAUSEA MEDICATION *UNUSUAL SHORTNESS OF BREATH *UNUSUAL BRUISING OR BLEEDING *URINARY PROBLEMS (pain or burning when urinating, or frequent urination) *BOWEL PROBLEMS (unusual diarrhea, constipation, pain near the anus) TENDERNESS IN MOUTH AND THROAT WITH OR WITHOUT PRESENCE OF ULCERS (sore throat, sores in mouth, or a toothache) UNUSUAL RASH, SWELLING OR PAIN  UNUSUAL VAGINAL DISCHARGE OR ITCHING   Items with * indicate a potential emergency and should be followed up as soon as possible or go to the  Emergency Department if any problems should occur.  Please show the CHEMOTHERAPY ALERT CARD or IMMUNOTHERAPY ALERT CARD at check-in to the Emergency Department and triage nurse.  Should you have questions after your visit or need to cancel or reschedule your appointment, please contact [place] [facility]  [phone] and follow the prompts.  Office hours are 8:00 a.m. to 4:30 p.m. Monday - Friday. Please note that voicemails left after 4:00 p.m. may not be returned until the following business day.  We are closed weekends and major holidays. You have access to a nurse at all times for urgent questions. Please call the main number to the clinic [phone] and follow the prompts.  For any non-urgent questions, you may also contact your provider using MyChart. We now offer e-Visits for anyone 69 and older to request care online for non-urgent symptoms. For details visit [email or web address].   Also download the MyChart app! Go to the app store, search "MyChart", open the app, select [facility], and log in with your MyChart username and password.

## 2022-10-25 NOTE — Assessment & Plan Note (Signed)
Refer to genetic counseling.

## 2022-10-25 NOTE — Assessment & Plan Note (Addendum)
Labs are reviewed and discussed with patient. Lab Results  Component Value Date   HGB 9.9 (L) 10/25/2022   TIBC 511 (H) 10/11/2022   IRONPCTSAT 3 (L) 10/11/2022   FERRITIN 2 (L) 10/11/2022    Hemoglobin has improved after 3 doses of Venofer Continue planned venofer for another 2 doses.

## 2022-10-25 NOTE — Assessment & Plan Note (Signed)
Chemotherapy plan as listed above 

## 2022-10-25 NOTE — Progress Notes (Signed)
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  Images and pathology results were reviewed and discussed with patient.  MRI showed  6.7 cm of abnormal enhancement, predominantly in the lower-inner inferior left breast, with the ultrasound-guided biopsy marking clip centrally positioned within the enhancement. Suspicious for involvement by cancer, cT3 disease.  Patient desires mastectomy.  I recommend Carboplatin, taxol Keytruda followed by [facility] [place] Q3 weeks x 4 Rationale and side effects were reviewed with patient.  Discussed about antiemetics instruction.  She agrees with the plan.  Proceed with cycle 1 carbo/taxol/Keytruda.  Patient has no desire of preserving fertility.    Family history of cancer Refer to genetic counseling.   IDA (iron deficiency anemia) Labs are reviewed and discussed with patient. Lab Results  Component Value Date   HGB 9.9 (L) 10/25/2022   TIBC 511 (H) 10/11/2022   IRONPCTSAT 3 (L) 10/11/2022   FERRITIN 2 (L) 10/11/2022    Hemoglobin has improved after 3 doses of Venofer Continue planned venofer for another 2 doses.   Encounter for antineoplastic chemotherapy Chemotherapy plan as listed above.   No orders of the defined types were placed in this encounter.  Follow-up 1 week lab MD chemotherapy. All questions were answered. The patient knows to call the clinic with any problems, questions or concerns.  [name], MD, PhD [facility] Hematology Oncology 10/25/2022    HISTORY OF PRESENTING ILLNESS:  Penny Gomez 37 y.o.  female presents to establish care for left triple negative breast cancer I have reviewed her chart and materials related to her cancer extensively and collaborated history with the patient. Summary of oncologic history is as follows: Oncology History  Invasive carcinoma of breast (HCC)  07/24/2022 Mammogram   She noticed left breast mass for 1 month.  Bilateral diagnostic mammogram showed Suspicious palpable left breast mass 8 o'clock position. Cortically thickened left axillary lymph node   10/10/2022 Initial Diagnosis   Invasive carcinoma of breast (HCC)  10/02/2022  Left breast mass biopsy and left axillary lymph node biopsy.  Diagnosis 1. Breast, left, needle core biopsy, 8 o'clock 6 cmfn, heart clip - INVASIVE MAMMARY CARCINOMA, NO SPECIAL TYPE. - TUBULE FORMATION: SCORE 3 - NUCLEAR PLEOMORPHISM: SCORE 3 - MITOTIC COUNT: SCORE 3 - TOTAL SCORE: 9 - OVERALL GRADE: 3 - LYMPHOVASCULAR INVASION: NOT IDENTIFIED - CANCER LENGTH: 11 MM - CALCIFICATIONS: NOT IDENTIFIED - DUCTAL CARCINOMA IN SITU: PRESENT, HIGH-GRADE - ER-, PR- HER2 - [IHC 1+] 2. Lymph node, needle/core biopsy, left axillary, hydromark - LYMPH NODE WITH REACTIVE CHANGES; NEGATIVE FOR MALIGNANCY.  Menarche at age of 38 First live birth at age of 42 OCP use: no History of hysterectomy: no Menopausal status: premenopausal History of HRT use: no History of chest radiation: no Number of previous breast biopsies:  right breast biopsy 05/20/2019 negative for malignancy Strong family history of cancer mother breast cancer diagnosed in 110s, maternal aunts x 2 breast cancer, maternal cousins breast cancer x 2, maternal grandmother pancreatic cancer.    10/10/2022 Cancer Staging   Staging form: Breast, AJCC 8th Edition - Clinical stage from 10/10/2022: Stage IIIB (  cT3, cN0, cM0, G3, ER-, PR-, HER2-) - Signed by [name], MD on 10/18/2022 Stage prefix: Initial diagnosis Nuclear grade: G3 Histologic grading system: 3 grade system    10/17/2022 Imaging   Bilateral MRI breasts w wo contrast  1. In total, there is approximately 6.7 cm of abnormal enhancement, predominantly in the lower-inner inferior left breast, with the ultrasound-guided biopsy marking clip centrally positioned within the enhancement. There are scattered small enhancing masses in the lower inner and lower outer anterior left (included in the measurement above).   2.  No evidence of right breast malignancy.   3. The axillary lymph nodes are not included within the field of view of this MRI for adequate assessment of lymphadenopathy.    10/17/2022 Procedure   S/p medi port placement.    10/25/2022 - 10/25/2022 Chemotherapy   Patient is on Treatment Plan : BREAST ADJUVANT DOSE DENSE AC q14d / PACLitaxel q7d     10/25/2022 -  Chemotherapy   Patient is on Treatment Plan : BREAST Pembrolizumab (200) D1 + Carboplatin (5) D1 + Paclitaxel (80) D1,8,15 q21d X 4 cycles / Pembrolizumab (200) D1 + AC D1 q21d x 4 cycles      Today she was accompanied by her mother. She has had chemotherapy class and has antiemetics at home.  S/p medi port placement    MEDICAL HISTORY:  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    SURGICAL HISTORY: Past Surgical History:  Procedure Laterality Date   BREAST BIOPSY Right 05/20/2019   Affirm Bx- X- clip, neg   BREAST BIOPSY Left 10/02/2022   [place] Bx, path pending   BREAST BIOPSY Left 10/02/2022   [place] Bx Node- path pending   BREAST BIOPSY Left 10/02/2022   [place] LT BREAST BX W LOC DEV 1ST LESION IMG BX SPEC US GUIDE 10/02/2022 [facility]   CESAREAN SECTION     LAPAROSCOPIC BILATERAL SALPINGECTOMY Bilateral 02/09/2022   Procedure: LAPAROSCOPIC BILATERAL SALPINGECTOMY;  Surgeon: Hildred Laser, MD;  Location: [facility];  Service: Gynecology;  Laterality: Bilateral;    PORTACATH PLACEMENT Right 10/17/2022   Procedure: INSERTION PORT-A-CATH;  Surgeon: Henrene Dodge, MD;  Location: [facility] OR[facility];  Service: General;  Laterality: Right;   TUBAL LIGATION      SOCIAL HISTORY: Social History   Socioeconomic History   Marital status: Single    Spouse name: Not on file   Number of children: 2   Years of education: Not on file   Highest education level: High school graduate  Occupational History   Not on file  Tobacco Use   Smoking status: Never   Smokeless tobacco: Never  Vaping Use   Vaping status: Never Used  Substance and Sexual Activity   Alcohol use: Yes    Comment: occ   Drug use: No   Sexual activity: Yes    Birth control/protection: Surgical  Other Topics Concern   Not on file  Social History Narrative   Not on file   Social Determinants of Health   Financial Resource Strain: Low Risk  (10/10/2022)   Overall Financial Resource Strain (CARDIA)    Difficulty of Paying Living Expenses: Not very hard  Food Insecurity: No Food Insecurity (10/10/2022)   Hunger Vital Sign    Worried About Running Out of Food in  the Last Year: Never true    Ran Out of Food in the Last Year: Never true  Transportation Needs: No Transportation Needs (09/23/2022)   PRAPARE - Administrator, Civil Service (Medical): No    Lack of Transportation (Non-Medical): No  Physical Activity: Not on file  Stress: No Stress Concern Present (10/10/2022)   [employer] of Occupational Health - Occupational Stress Questionnaire    Feeling of Stress : Only a little  Social Connections: Not on file  Intimate Partner Violence: Not on file    FAMILY HISTORY: Family History  Problem Relation Age of Onset   Breast cancer Mother 14   Cancer Maternal Grandfather    Pancreatic cancer Maternal Grandfather    Breast cancer Maternal Aunt 33   Cancer Maternal Uncle    Breast cancer Cousin 70   Cancer Other     ALLERGIES:  has No Known Allergies.  MEDICATIONS:   Current Outpatient Medications  Medication Sig Dispense Refill   acetaminophen (TYLENOL) 500 MG tablet Take 2 tablets (1,000 mg total) by mouth every 6 (six) hours as needed for mild pain.     ibuprofen (ADVIL) 600 MG tablet Take 1 tablet (600 mg total) by mouth every 8 (eight) hours as needed for moderate pain. 60 tablet 0   lidocaine-prilocaine (EMLA) cream Apply 1 Application topically as needed. 30 g 11   LORazepam (ATIVAN) 0.5 MG tablet Take 1 tablet (0.5 mg total) by mouth every 8 (eight) hours as needed for anxiety (nausea or vomiting). 30 tablet 0   ondansetron (ZOFRAN) 8 MG tablet Take 1 tablet (8 mg total) by mouth See admin instructions. If needed, start on Day 3 of chemotherapy, take 1 tablet every 8 hours as needed for nausea or vomiting. (Patient not taking: Reported on 10/25/2022) 90 tablet 0   oxyCODONE (OXY IR/ROXICODONE) 5 MG immediate release tablet Take 1 tablet (5 mg total) by mouth every 6 (six) hours as needed for severe pain. (Patient not taking: Reported on 10/25/2022) 20 tablet 0   prochlorperazine (COMPAZINE) 10 MG tablet Take 1 tablet (10 mg total) by mouth every 6 (six) hours as needed for nausea or vomiting. (Patient not taking: Reported on 10/25/2022) 90 tablet 3   No current facility-administered medications for this visit.   Facility-Administered Medications Ordered in Other Visits  Medication Dose Route Frequency Provider Last Rate Last Admin   heparin lock flush 100 unit/mL  500 Units Intracatheter Once PRN [name], MD        Review of Systems  Constitutional:  Negative for appetite change, chills, fatigue and fever.  HENT:   Negative for hearing loss and voice change.   Eyes:  Negative for eye problems.  Respiratory:  Negative for chest tightness and cough.   Cardiovascular:  Negative for chest pain.  Gastrointestinal:  Negative for abdominal distention, abdominal pain and blood in stool.  Endocrine: Negative for hot flashes.  Genitourinary:  Negative for  difficulty urinating and frequency.   Musculoskeletal:  Negative for arthralgias.  Skin:  Negative for itching and rash.  Neurological:  Negative for extremity weakness.  Hematological:  Negative for adenopathy.  Psychiatric/Behavioral:  Negative for confusion.      PHYSICAL EXAMINATION: ECOG PERFORMANCE STATUS: 0 - Asymptomatic  Vitals:   10/25/22 0845  BP: 102/68  Pulse: 68  Resp: 16  Temp: (!) 96.2 F (35.7 C)  SpO2: 100%   Filed Weights   10/25/22 0845  Weight: 134 lb (60.8 kg)  Physical Exam Constitutional:      General: She is not in acute distress.    Appearance: She is not diaphoretic.  HENT:     Head: Normocephalic and atraumatic.  Eyes:     General: No scleral icterus.    Pupils: Pupils are equal, round, and reactive to light.  Cardiovascular:     Rate and Rhythm: Normal rate and regular rhythm.     Heart sounds: No murmur heard. Pulmonary:     Effort: Pulmonary effort is normal. No respiratory distress.     Breath sounds: No wheezing.  Abdominal:     General: There is no distension.     Palpations: Abdomen is soft.     Tenderness: There is no abdominal tenderness.  Musculoskeletal:        General: Normal range of motion.     Cervical back: Normal range of motion and neck supple.  Skin:    General: Skin is warm and dry.     Findings: No erythema.  Neurological:     Mental Status: She is alert and oriented to person, place, and time.     Cranial Nerves: No cranial nerve deficit.     Motor: No abnormal muscle tone.     Coordination: Coordination normal.  Psychiatric:        Mood and Affect: Mood and affect normal.     Comments: tearful   Small palpable left inner lower quadrant mass.  No palpable axillary lymphadenopathy bilaterally.  No palpable mass in right breast.  LABORATORY DATA:  I have reviewed the data as listed    Latest Ref Rng & Units 10/25/2022    8:18 AM 10/17/2022    6:14 PM 10/16/2022   10:47 AM  CBC  WBC 4.0 - 10.5 K/uL 5.3   8.0  7.2   Hemoglobin 12.0 - 15.0 g/dL 9.9  8.6  6.9   Hematocrit 36.0 - 46.0 % 34.9  30.1  24.2   Platelets 150 - 400 K/uL 219  371  370       Latest Ref Rng & Units 10/25/2022    8:18 AM 10/10/2022   10:45 AM 02/09/2022    2:58 PM  CMP  Glucose 70 - 99 mg/dL 841  324  401   BUN 6 - 20 mg/dL 9  12  7    Creatinine 0.44 - 1.00 mg/dL 0.27  2.53  6.64   Sodium 135 - 145 mmol/L 135  136  135   Potassium 3.5 - 5.1 mmol/L 3.6  3.8  3.2   Chloride 98 - 111 mmol/L 107  109  108   CO2 22 - 32 mmol/L 24  22  22    Calcium 8.9 - 10.3 mg/dL 9.0  8.9  8.7   Total Protein 6.5 - 8.1 g/dL 7.2  7.3  7.4   Total Bilirubin 0.3 - 1.2 mg/dL 0.4  0.4  0.4   Alkaline Phos 38 - 126 U/L 75  61  60   AST 15 - 41 U/L 20  18  20    ALT 0 - 44 U/L 12  12  11       RADIOGRAPHIC STUDIES: I have personally reviewed the radiological images as listed and agreed with the findings in the report. DG Chest Port 1 View  Result Date: 10/17/2022 CLINICAL DATA:  Port-A-Cath placement EXAM: PORTABLE CHEST 1 VIEW COMPARISON:  None. FINDINGS: Right chest wall port with tip at the expected area of the upper SVC. Cardiac and mediastinal contours  are within normal limits for AP technique. Low lung volumes. Lungs are clear. No large pleural effusion or evidence of pneumothorax. IMPRESSION: Right chest wall port with tip at the the upper SVC. Electronically Signed   By: Allegra Lai M.D.   On: 10/17/2022 19:48   DG C-Arm 1-60 Min-No Report  Result Date: 10/17/2022 Fluoroscopy was utilized by the requesting physician.  No radiographic interpretation.   ECHOCARDIOGRAM COMPLETE  Result Date: 10/17/2022    ECHOCARDIOGRAM REPORT   Patient Name:   ERIELLE SPROWL Date of Exam: 10/17/2022 Medical Rec #:  [id number]       Height:       60.0 in Accession #:    [id number]      Weight:       135.4 lb Date of Birth:  March 30, 1985        BSA:          1.581 m Patient Age:    37 years        BP:           113/64 mmHg Patient Gender: F                HR:           62 bpm. Exam Location:  [facility] Procedure: 2D Echo, Cardiac Doppler, Color Doppler and Strain Analysis Indications:     Chemo Z09  History:         Patient has no prior history of Echocardiogram examinations. No                  cardiac history on file.  Sonographer:     Cristela Blue Referring Phys:  [id number] Shatana Saxton Diagnosing Phys: Julien Nordmann MD  Sonographer Comments: Global longitudinal strain was attempted. IMPRESSIONS  1. Left ventricular ejection fraction, by estimation, is 60 to 65%. The left ventricle has normal function. The left ventricle has no regional wall motion abnormalities. Left ventricular diastolic parameters were normal.  2. Right ventricular systolic function is normal. The right ventricular size is normal.  3. The mitral valve is normal in structure. No evidence of mitral valve regurgitation. No evidence of mitral stenosis.  4. The aortic valve is tricuspid. Aortic valve regurgitation is not visualized. No aortic stenosis is present.  5. The inferior vena cava is normal in size with greater than 50% respiratory variability, suggesting right atrial pressure of 3 mmHg. FINDINGS  Left Ventricle: Left ventricular ejection fraction, by estimation, is 60 to 65%. The left ventricle has normal function. The left ventricle has no regional wall motion abnormalities. The left ventricular internal cavity size was normal in size. There is  no left ventricular hypertrophy. Left ventricular diastolic parameters were normal. Right Ventricle: The right ventricular size is normal. No increase in right ventricular wall thickness. Right ventricular systolic function is normal. Left Atrium: Left atrial size was normal in size. Right Atrium: Right atrial size was normal in size. Pericardium: There is no evidence of pericardial effusion. Mitral Valve: The mitral valve is normal in structure. No evidence of mitral valve regurgitation. No evidence of mitral valve stenosis. MV peak gradient, 4.4 mmHg. The  mean mitral valve gradient is 1.0 mmHg. Tricuspid Valve: The tricuspid valve is normal in structure. Tricuspid valve regurgitation is not demonstrated. No evidence of tricuspid stenosis. Aortic Valve: The aortic valve is tricuspid. Aortic valve regurgitation is not visualized. No aortic stenosis is present. Aortic valve mean gradient measures 3.0 mmHg. Aortic valve peak gradient measures 5.7 mmHg.  Aortic valve area, by VTI measures 3.13 cm. Pulmonic Valve: The pulmonic valve was normal in structure. Pulmonic valve regurgitation is not visualized. No evidence of pulmonic stenosis. Aorta: The aortic root is normal in size and structure. Venous: The inferior vena cava is normal in size with greater than 50% respiratory variability, suggesting right atrial pressure of 3 mmHg. IAS/Shunts: No atrial level shunt detected by color flow Doppler.  LEFT VENTRICLE PLAX 2D LVIDd:         4.10 cm   Diastology LVIDs:         2.40 cm   LV e' medial:    12.80 cm/s LV PW:         0.80 cm   LV E/e' medial:  8.2 LV IVS:        0.90 cm   LV e' lateral:   13.30 cm/s LVOT diam:     2.10 cm   LV E/e' lateral: 7.9 LV SV:         78 LV SV Index:   49 LVOT Area:     3.46 cm  RIGHT VENTRICLE RV Basal diam:  3.60 cm RV Mid diam:    2.70 cm RV S prime:     12.60 cm/s LEFT ATRIUM             Index        RIGHT ATRIUM          Index LA diam:        2.80 cm 1.77 cm/m   RA Area:     8.25 cm LA Vol (A2C):   28.3 ml 17.90 ml/m  RA Volume:   13.40 ml 8.47 ml/m LA Vol (A4C):   40.3 ml 25.48 ml/m LA Biplane Vol: 36.4 ml 23.02 ml/m  AORTIC VALVE AV Area (Vmax):    2.94 cm AV Area (Vmean):   3.05 cm AV Area (VTI):     3.13 cm AV Vmax:           119.00 cm/s AV Vmean:          81.300 cm/s AV VTI:            0.250 m AV Peak Grad:      5.7 mmHg AV Mean Grad:      3.0 mmHg LVOT Vmax:         101.00 cm/s LVOT Vmean:        71.600 cm/s LVOT VTI:          0.226 m LVOT/AV VTI ratio: 0.90  AORTA Ao Root diam: 2.40 cm MITRAL VALVE                TRICUSPID  VALVE MV Area (PHT): 3.05 cm     TR Peak grad:   13.4 mmHg MV Area VTI:   2.47 cm     TR Vmax:        183.00 cm/s MV Peak grad:  4.4 mmHg MV Mean grad:  1.0 mmHg     SHUNTS MV Vmax:       1.05 m/s     Systemic VTI:  0.23 m MV Vmean:      47.5 cm/s    Systemic Diam: 2.10 cm MV Decel Time: 249 msec MV E velocity: 105.00 cm/s MV A velocity: 62.90 cm/s MV E/A ratio:  1.67 Julien Nordmann MD Electronically signed by Julien Nordmann MD Signature Date/Time: 10/17/2022/11:30:44 AM    Final    MR BREAST BILATERAL W WO CONTRAST INC CAD  Result Date: 10/17/2022 CLINICAL DATA:  37 year old female who presented with a palpable lump in the left breast in August of 2024. 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 A left axillary lymph node was biopsied demonstrating reactive changes, without evidence of malignancy. The patient has strong family history of breast cancer. EXAM: BILATERAL BREAST MRI WITH AND WITHOUT CONTRAST TECHNIQUE: Multiplanar, multisequence MR images of both breasts were obtained prior to and following the intravenous administration of 6 ml of Gadavist Three-dimensional MR images were rendered by post-processing of the original MR data on an independent workstation. The three-dimensional MR images were interpreted, and findings are reported in the following complete MRI report for this study. Three dimensional images were evaluated at the independent interpreting workstation using the DynaCAD thin client. COMPARISON:  No prior MRI available for comparison. Correlation made with prior mammogram and ultrasound images. FINDINGS: Breast composition: c. Heterogeneous fibroglandular tissue. Background parenchymal enhancement: Minimal Right breast: No mass or abnormal enhancement. Left breast: In the lower-inner quadrant of the left breast, there is an enhancing mass with surrounding non mass enhancement measuring 3.8 cm. Additionally,  there are several scattered small scattered enhancing masses in the inferior anterior left breast, in the lower outer and lower inner quadrants (series 14, images 30-36). Together with the area of biopsy proven malignancy, the abnormal enhancement spans approximately 6.7 cm. The biopsy marking clip from the ultrasound-guided biopsy is positioned centrally within this broader area of abnormal enhancement. Lymph nodes: The axillae were not included within the field of view on imaging for adequate assessment of the lymph nodes. No abnormal lymph nodes seen along the internal mammary chain. Ancillary findings:  None. IMPRESSION: 1. In total, there is approximately 6.7 cm of abnormal enhancement, predominantly in the lower-inner inferior left breast, with the ultrasound-guided biopsy marking clip centrally positioned within the enhancement. There are scattered small enhancing masses in the lower inner and lower outer anterior left (included in the measurement above). 2.  No evidence of right breast malignancy. 3. The axillary lymph nodes are not included within the field of view of this MRI for adequate assessment of lymphadenopathy. RECOMMENDATION: MRI guided biopsy is recommended for the anterior and posterior aspect of non mass enhancement in the right breast to establish extent of disease, if the patient is considering breast conservation. BI-RADS CATEGORY  4: Suspicious. Electronically Signed   By: Frederico Hamman M.D.   On: 10/17/2022 10:40   [place] LT BREAST BX W LOC DEV 1ST LESION IMG BX SPEC US GUIDE  Addendum Date: 10/03/2022   ADDENDUM REPORT: 10/03/2022 13:35 ADDENDUM: PATHOLOGY revealed: Site 1. Breast, left, needle core biopsy, 8 o'clock 6 cmfn, heart clip - INVASIVE MAMMARY CARCINOMA, NO SPECIAL TYPE. - OVERALL GRADE: 3 - LYMPHOVASCULAR INVASION: NOT IDENTIFIED. - CANCER LENGTH: 11 MM - CALCIFICATIONS: NOT IDENTIFIED. - DUCTAL CARCINOMA IN SITU: PRESENT, HIGH-GRADE. Pathology results are CONCORDANT with  imaging findings, per Dr. Meda Klinefelter. PATHOLOGY revealed: Site 2. Lymph node, needle/core biopsy, left axillary, hydromark - LYMPH NODE WITH REACTIVE CHANGES; NEGATIVE FOR MALIGNANCY. Pathology results are CONCORDANT with imaging findings, per Dr. Meda Klinefelter. Pathology results and recommendations below were discussed with patient and her mother Asimina Crock) by telephone on 10/03/2022. Patient reported biopsy site within normal limits with slight tenderness at the site. Post biopsy care instructions were reviewed, questions were answered and my direct phone number was provided to patient. Patient was instructed to call [facility] if any concerns  or questions arise related to the biopsy. RECOMMENDATIONS: 1. Surgical and oncological consultation. Request for surgical and oncological consultation relayed to Irving Shows RN at [facility] by Randa Lynn RN on 10/03/2022. 2. Recommend pretreatment bilateral breast MRI with and without contrast to determine extent of breast disease given breast density and patient's young age. Pathology results reported by Randa Lynn RN on 10/03/2022. Electronically Signed   By: Meda Klinefelter M.D.   On: 10/03/2022 13:35   Result Date: 10/03/2022 CLINICAL DATA:  Palpable LEFT breast mass. Indeterminate LEFT axillary lymph node. Strong family history of breast cancer. EXAM: ULTRASOUND GUIDED LEFT BREAST CORE NEEDLE BIOPSY [place] AXILLARY NODE CORE BIOPSY LEFT COMPARISON:  Previous exam(s). PROCEDURE: I met with the patient and we discussed the procedure of ultrasound-guided biopsy, including benefits and alternatives. We discussed the high likelihood of a successful procedure. We discussed the risks of the procedure, including infection, bleeding, tissue injury, clip migration, and inadequate sampling. Informed written consent was given. The usual time-out protocol was performed immediately prior to the procedure. Site 1: LEFT breast 8 o'clock 6  cm from the nipple. Lesion quadrant: Lower inner quadrant Using sterile technique and 1% lidocaine and 1% lidocaine with epinephrine as local anesthetic, under direct ultrasound visualization, a 14 gauge spring-loaded device was used to perform biopsy of a mass at 8 o'clock using a medial approach. At the conclusion of the procedure a heart shaped tissue marker clip was deployed into the biopsy cavity. Follow up 2 view mammogram was performed and dictated separately. Site 2: LEFT axillary lymph node Using sterile technique and 1% lidocaine and 1% lidocaine with epinephrine as local anesthetic, under direct ultrasound visualization, a 14 gauge spring-loaded device was used to perform biopsy of a LEFT axillary lymph node using a lateral approach. At the conclusion of the procedure a HYDROMARK 4 butterfly shaped tissue marker clip was deployed into the biopsy cavity. Follow up 2 view mammogram was performed and dictated separately. IMPRESSION: Ultrasound guided biopsy of a LEFT breast mass and LEFT axillary lymph node. No apparent complications. Electronically Signed: By: Meda Klinefelter M.D. On: 10/02/2022 09:58   [place] AXILLARY NODE CORE BIOPSY LEFT  Addendum Date: 10/03/2022   ADDENDUM REPORT: 10/03/2022 13:35 ADDENDUM: PATHOLOGY revealed: Site 1. Breast, left, needle core biopsy, 8 o'clock 6 cmfn, heart clip - INVASIVE MAMMARY CARCINOMA, NO SPECIAL TYPE. - OVERALL GRADE: 3 - LYMPHOVASCULAR INVASION: NOT IDENTIFIED. - CANCER LENGTH: 11 MM - CALCIFICATIONS: NOT IDENTIFIED. - DUCTAL CARCINOMA IN SITU: PRESENT, HIGH-GRADE. Pathology results are CONCORDANT with imaging findings, per Dr. Meda Klinefelter. PATHOLOGY revealed: Site 2. Lymph node, needle/core biopsy, left axillary, hydromark - LYMPH NODE WITH REACTIVE CHANGES; NEGATIVE FOR MALIGNANCY. Pathology results are CONCORDANT with imaging findings, per Dr. Meda Klinefelter. Pathology results and recommendations below were discussed with patient and her mother  Charesse Vanover) by telephone on 10/03/2022. Patient reported biopsy site within normal limits with slight tenderness at the site. Post biopsy care instructions were reviewed, questions were answered and my direct phone number was provided to patient. Patient was instructed to call [facility] if any concerns or questions arise related to the biopsy. RECOMMENDATIONS: 1. Surgical and oncological consultation. Request for surgical and oncological consultation relayed to Irving Shows RN at [facility] by Randa Lynn RN on 10/03/2022. 2. Recommend pretreatment bilateral breast MRI with and without contrast to determine extent of breast disease given breast density and patient's young age. Pathology results reported by Randa Lynn RN  on 10/03/2022. Electronically Signed   By: Meda Klinefelter M.D.   On: 10/03/2022 13:35   Result Date: 10/03/2022 CLINICAL DATA:  Palpable LEFT breast mass. Indeterminate LEFT axillary lymph node. Strong family history of breast cancer. EXAM: ULTRASOUND GUIDED LEFT BREAST CORE NEEDLE BIOPSY [place] AXILLARY NODE CORE BIOPSY LEFT COMPARISON:  Previous exam(s). PROCEDURE: I met with the patient and we discussed the procedure of ultrasound-guided biopsy, including benefits and alternatives. We discussed the high likelihood of a successful procedure. We discussed the risks of the procedure, including infection, bleeding, tissue injury, clip migration, and inadequate sampling. Informed written consent was given. The usual time-out protocol was performed immediately prior to the procedure. Site 1: LEFT breast 8 o'clock 6 cm from the nipple. Lesion quadrant: Lower inner quadrant Using sterile technique and 1% lidocaine and 1% lidocaine with epinephrine as local anesthetic, under direct ultrasound visualization, a 14 gauge spring-loaded device was used to perform biopsy of a mass at 8 o'clock using a medial approach. At the conclusion of the procedure a heart shaped tissue  marker clip was deployed into the biopsy cavity. Follow up 2 view mammogram was performed and dictated separately. Site 2: LEFT axillary lymph node Using sterile technique and 1% lidocaine and 1% lidocaine with epinephrine as local anesthetic, under direct ultrasound visualization, a 14 gauge spring-loaded device was used to perform biopsy of a LEFT axillary lymph node using a lateral approach. At the conclusion of the procedure a HYDROMARK 4 butterfly shaped tissue marker clip was deployed into the biopsy cavity. Follow up 2 view mammogram was performed and dictated separately. IMPRESSION: Ultrasound guided biopsy of a LEFT breast mass and LEFT axillary lymph node. No apparent complications. Electronically Signed: By: Meda Klinefelter M.D. On: 10/02/2022 09:58   MS CLIP PLACEMENT LEFT  Result Date: 10/02/2022 CLINICAL DATA:  Status post ultrasound-guided biopsy. EXAM: DIAGNOSTIC LEFT MAMMOGRAM POST ULTRASOUND BIOPSY COMPARISON:  Previous exam(s). FINDINGS: Mammographic images were obtained following ultrasound guided biopsy of a LEFT breast mass at 8 o'clock. The heart biopsy marking clip is in expected position at the site of biopsy. Mammographic images were obtained following ultrasound guided biopsy of a LEFT axillary lymph node. The [facility] 4 butterfly biopsy marking clip is in expected position at the site of biopsy. IMPRESSION: 1. Appropriate positioning of the heart shaped biopsy marking clip at the site of biopsy in the inner breast. 2. Appropriate positioning of the [facility] shaped biopsy marking clip at the site of biopsy in the LEFT axilla. Final Assessment: Post Procedure Mammograms for Marker Placement Electronically Signed   By: Meda Klinefelter M.D.   On: 10/02/2022 09:54

## 2022-10-25 NOTE — Progress Notes (Signed)
Pt in for follow up and first chemo treatment today.  Denies any concerns.

## 2022-10-25 NOTE — Assessment & Plan Note (Addendum)
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  Images and pathology results were reviewed and discussed with patient.  MRI showed  6.7 cm of abnormal enhancement, predominantly in the lower-inner inferior left breast, with the ultrasound-guided biopsy marking clip centrally positioned within the enhancement. Suspicious for involvement by cancer, cT3 disease.  Patient desires mastectomy.  I recommend Carboplatin, taxol Keytruda followed by [facility] Q3 weeks x 4 Rationale and side effects were reviewed with patient.  Discussed about antiemetics instruction.  She agrees with the plan.  Proceed with cycle 1 carbo/taxol/Keytruda.  Patient has no desire of preserving fertility.

## 2022-10-26 LAB — T4: T4, Total: 7.5 ug/dL (ref 4.5–12.0)

## 2022-10-28 ENCOUNTER — Ambulatory Visit: Payer: Medicaid Other

## 2022-10-28 ENCOUNTER — Other Ambulatory Visit: Payer: Self-pay | Admitting: *Deleted

## 2022-10-28 ENCOUNTER — Encounter: Payer: Self-pay | Admitting: Oncology

## 2022-10-28 DIAGNOSIS — D509 Iron deficiency anemia, unspecified: Secondary | ICD-10-CM

## 2022-10-28 DIAGNOSIS — R5383 Other fatigue: Secondary | ICD-10-CM

## 2022-10-28 DIAGNOSIS — C50919 Malignant neoplasm of unspecified site of unspecified female breast: Secondary | ICD-10-CM

## 2022-10-29 ENCOUNTER — Other Ambulatory Visit: Payer: Self-pay

## 2022-10-29 ENCOUNTER — Inpatient Hospital Stay: Payer: Medicaid Other

## 2022-10-29 ENCOUNTER — Encounter: Payer: Self-pay | Admitting: *Deleted

## 2022-10-29 ENCOUNTER — Encounter: Payer: Self-pay | Admitting: Hospice and Palliative Medicine

## 2022-10-29 ENCOUNTER — Inpatient Hospital Stay (HOSPITAL_BASED_OUTPATIENT_CLINIC_OR_DEPARTMENT_OTHER): Payer: Medicaid Other | Admitting: Hospice and Palliative Medicine

## 2022-10-29 VITALS — BP 121/78 | HR 98 | Temp 97.2°F | Resp 18 | Ht 60.0 in | Wt 132.9 lb

## 2022-10-29 DIAGNOSIS — C50919 Malignant neoplasm of unspecified site of unspecified female breast: Secondary | ICD-10-CM

## 2022-10-29 DIAGNOSIS — Z5112 Encounter for antineoplastic immunotherapy: Secondary | ICD-10-CM | POA: Diagnosis not present

## 2022-10-29 DIAGNOSIS — R530 Neoplastic (malignant) related fatigue: Secondary | ICD-10-CM

## 2022-10-29 DIAGNOSIS — E860 Dehydration: Secondary | ICD-10-CM

## 2022-10-29 DIAGNOSIS — R5383 Other fatigue: Secondary | ICD-10-CM

## 2022-10-29 DIAGNOSIS — D509 Iron deficiency anemia, unspecified: Secondary | ICD-10-CM

## 2022-10-29 LAB — CMP (CANCER CENTER ONLY)
ALT: 48 U/L — ABNORMAL HIGH (ref 0–44)
AST: 71 U/L — ABNORMAL HIGH (ref 15–41)
Albumin: 3.9 g/dL (ref 3.5–5.0)
Alkaline Phosphatase: 74 U/L (ref 38–126)
Anion gap: 4 — ABNORMAL LOW (ref 5–15)
BUN: 14 mg/dL (ref 6–20)
CO2: 25 mmol/L (ref 22–32)
Calcium: 8.9 mg/dL (ref 8.9–10.3)
Chloride: 103 mmol/L (ref 98–111)
Creatinine: 0.59 mg/dL (ref 0.44–1.00)
GFR, Estimated: >60 mL/min (ref >60)
Glucose, Bld: 93 mg/dL (ref 70–99)
Potassium: 4.3 mmol/L (ref 3.5–5.1)
Sodium: 132 mmol/L — ABNORMAL LOW (ref 135–145)
Total Bilirubin: 1.1 mg/dL (ref 0.3–1.2)
Total Protein: 7.5 g/dL (ref 6.5–8.1)

## 2022-10-29 LAB — CBC WITH DIFFERENTIAL (CANCER CENTER ONLY)
Abs Immature Granulocytes: 0.01 10*3/uL (ref 0.00–0.07)
Basophils Absolute: 0 10*3/uL (ref 0.0–0.1)
Basophils Relative: 0 %
Eosinophils Absolute: 0 10*3/uL (ref 0.0–0.5)
Eosinophils Relative: 0 %
HCT: 32.7 % — ABNORMAL LOW (ref 36.0–46.0)
Hemoglobin: 9.5 g/dL — ABNORMAL LOW (ref 12.0–15.0)
Immature Granulocytes: 0 %
Lymphocytes Relative: 32 %
Lymphs Abs: 1.1 10*3/uL (ref 0.7–4.0)
MCH: 20.4 pg — ABNORMAL LOW (ref 26.0–34.0)
MCHC: 29.1 g/dL — ABNORMAL LOW (ref 30.0–36.0)
MCV: 70.2 fL — ABNORMAL LOW (ref 80.0–100.0)
Monocytes Absolute: 0.3 10*3/uL (ref 0.1–1.0)
Monocytes Relative: 9 %
Neutro Abs: 2 10*3/uL (ref 1.7–7.7)
Neutrophils Relative %: 59 %
Platelet Count: 108 10*3/uL — ABNORMAL LOW (ref 150–400)
RBC: 4.66 MIL/uL (ref 3.87–5.11)
RDW: 26.9 % — ABNORMAL HIGH (ref 11.5–15.5)
WBC Count: 3.5 10*3/uL — ABNORMAL LOW (ref 4.0–10.5)
nRBC: 0 % (ref 0.0–0.2)

## 2022-10-29 LAB — SAMPLE TO BLOOD BANK

## 2022-10-29 LAB — MAGNESIUM: Magnesium: 2.3 mg/dL (ref 1.7–2.4)

## 2022-10-29 MED ORDER — SODIUM CHLORIDE 0.9 % IV SOLN
INTRAVENOUS | Status: DC
  Filled 2022-10-29 (×2): qty 250

## 2022-10-29 MED ORDER — SODIUM CHLORIDE 0.9% FLUSH
10.0000 mL | Freq: Once | INTRAVENOUS | Status: AC
  Administered 2022-10-29: 10 mL via INTRAVENOUS
  Filled 2022-10-29: qty 10

## 2022-10-29 MED ORDER — HEPARIN SOD (PORK) LOCK FLUSH 100 UNIT/ML IV SOLN
500.0000 [IU] | Freq: Once | INTRAVENOUS | Status: AC
  Administered 2022-10-29: 500 [IU] via INTRAVENOUS
  Filled 2022-10-29: qty 5

## 2022-10-29 NOTE — Progress Notes (Signed)
Symptom Management Clinic [facility] Telephone:([phone]) [phone] Fax:([phone]  Patient Care Team: Shane Crutch, [place] as PCP - General (Family Medicine) Hulen Luster, RN as Oncology Nurse Navigator [name], MD as Consulting Physician (Oncology)   NAME OF PATIENT: Penny Gomez  [id number]  [birth year]   DATE OF VISIT: 10/29/22  REASON FOR CONSULT: INNA SYVERTSON is a 37 y.o. 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   INTERVAL HISTORY: Patient saw Dr. Cathie Hoops on 10/25/2022 and received cycle 1 neoadjuvant CarboTaxol Keytruda.  Patient [facility] to [facility] today for evaluation of fatigue.  Patient endorses worse fatigue following chemotherapy.  She says that she is sleeping well at night but often napping during the daytime as well.  Oral intake is poor.  Denies nausea, vomiting, or diarrhea.  Does endorse chronic constipation for many years with an average bowel movement weekly.  No GU symptoms.  No fever or chills.  No respiratory symptoms. Patient offers no further specific complaints today.  PAST MEDICAL HISTORY: 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    PAST SURGICAL HISTORY:  Past Surgical History:  Procedure Laterality Date   BREAST BIOPSY Right 05/20/2019   Affirm Bx- X- clip, neg   BREAST BIOPSY Left 10/02/2022   [place] Bx, path pending   BREAST BIOPSY Left 10/02/2022   [place] Bx Node- path pending   BREAST BIOPSY Left 10/02/2022   [place] LT BREAST BX W LOC DEV 1ST LESION IMG BX SPEC US GUIDE 10/02/2022 [facility]   CESAREAN SECTION     LAPAROSCOPIC BILATERAL SALPINGECTOMY Bilateral 02/09/2022   Procedure: LAPAROSCOPIC BILATERAL SALPINGECTOMY;  Surgeon: Hildred Laser, MD;  Location: [facility] ORS;  Service: Gynecology;  Laterality:  Bilateral;   PORTACATH PLACEMENT Right 10/17/2022   Procedure: INSERTION PORT-A-CATH;  Surgeon: Henrene Dodge, MD;  Location: [facility] ORS;  Service: General;  Laterality: Right;   TUBAL LIGATION      HEMATOLOGY/ONCOLOGY HISTORY:  Oncology History  Invasive carcinoma of breast (HCC)  07/24/2022 Mammogram   She noticed left breast mass for 1 month.  Bilateral diagnostic mammogram showed Suspicious palpable left breast mass 8 o'clock position. Cortically thickened left axillary lymph node   10/10/2022 Initial Diagnosis   Invasive carcinoma of breast (HCC)  10/02/2022  Left breast mass biopsy and left axillary lymph node biopsy.  Diagnosis 1. Breast, left, needle core biopsy, 8 o'clock 6 cmfn, heart clip - INVASIVE MAMMARY CARCINOMA, NO SPECIAL TYPE. - TUBULE FORMATION: SCORE 3 - NUCLEAR PLEOMORPHISM: SCORE 3 - MITOTIC COUNT: SCORE 3 - TOTAL SCORE: 9 - OVERALL GRADE: 3 - LYMPHOVASCULAR INVASION: NOT IDENTIFIED - CANCER LENGTH: 11 MM - CALCIFICATIONS: NOT IDENTIFIED - DUCTAL CARCINOMA IN SITU: PRESENT, HIGH-GRADE - ER-, PR- HER2 - [IHC 1+] 2. Lymph node, needle/core biopsy, left axillary, hydromark - LYMPH NODE WITH REACTIVE CHANGES; NEGATIVE FOR MALIGNANCY.  Menarche at age of 30 First live birth at age of 37 OCP use: no History of hysterectomy: no Menopausal status: premenopausal History of HRT use: no History of chest radiation: no Number of previous breast biopsies:  right breast biopsy 05/20/2019 negative for malignancy Strong family history of cancer mother breast cancer diagnosed in 53s, maternal aunts x 2 breast cancer, maternal cousins breast cancer x  2, maternal grandmother pancreatic cancer.    10/10/2022 Cancer Staging   Staging form: Breast, AJCC 8th Edition - Clinical stage from 10/10/2022: Stage IIIB (cT3, cN0, cM0, G3, ER-, PR-, HER2-) - Signed by [name], MD on 10/18/2022 Stage prefix: Initial diagnosis Nuclear grade: G3 Histologic grading system: 3 grade system    10/17/2022 Imaging   Bilateral MRI breasts w wo contrast  1. In total, there is approximately 6.7 cm of abnormal enhancement, predominantly in the lower-inner inferior left breast, with the ultrasound-guided biopsy marking clip centrally positioned within the enhancement. There are scattered small enhancing masses in the lower inner and lower outer anterior left (included in the measurement above).   2.  No evidence of right breast malignancy.   3. The axillary lymph nodes are not included within the field of view of this MRI for adequate assessment of lymphadenopathy.    10/17/2022 Procedure   S/p medi port placement.    10/25/2022 - 10/25/2022 Chemotherapy   Patient is on Treatment Plan : BREAST ADJUVANT DOSE DENSE AC q14d / PACLitaxel q7d     10/25/2022 -  Chemotherapy   Patient is on Treatment Plan : BREAST Pembrolizumab (200) D1 + Carboplatin (5) D1 + Paclitaxel (80) D1,8,15 q21d X 4 cycles / Pembrolizumab (200) D1 + AC D1 q21d x 4 cycles       ALLERGIES:  has No Known Allergies.  MEDICATIONS:  Current Outpatient Medications  Medication Sig Dispense Refill   lidocaine-prilocaine (EMLA) cream Apply 1 Application topically as needed. 30 g 11   acetaminophen (TYLENOL) 500 MG tablet Take 2 tablets (1,000 mg total) by mouth every 6 (six) hours as needed for mild pain. (Patient not taking: Reported on 10/29/2022)     ibuprofen (ADVIL) 600 MG tablet Take 1 tablet (600 mg total) by mouth every 8 (eight) hours as needed for moderate pain. (Patient not taking: Reported on 10/29/2022) 60 tablet 0   LORazepam (ATIVAN) 0.5 MG tablet Take 1 tablet (0.5 mg total) by mouth every 8 (eight) hours as needed for anxiety (nausea or vomiting). (Patient not taking: Reported on 10/29/2022) 30 tablet 0   ondansetron (ZOFRAN) 8 MG tablet Take 1 tablet (8 mg total) by mouth See admin instructions. If needed, start on Day 3 of chemotherapy, take 1 tablet every 8 hours as needed for nausea or vomiting. (Patient not  taking: Reported on 10/25/2022) 90 tablet 0   oxyCODONE (OXY IR/ROXICODONE) 5 MG immediate release tablet Take 1 tablet (5 mg total) by mouth every 6 (six) hours as needed for severe pain. (Patient not taking: Reported on 10/25/2022) 20 tablet 0   prochlorperazine (COMPAZINE) 10 MG tablet Take 1 tablet (10 mg total) by mouth every 6 (six) hours as needed for nausea or vomiting. (Patient not taking: Reported on 10/25/2022) 90 tablet 3   No current facility-administered medications for this visit.   Facility-Administered Medications Ordered in Other Visits  Medication Dose Route Frequency Provider Last Rate Last Admin   heparin lock flush 100 unit/mL  500 Units Intravenous Once Shekita Boyden, Daryl Eastern, NP        VITAL SIGNS: BP 121/78 Comment: standing- orthostatic bp  Pulse 98   Temp (!) 97.2 F (36.2 C) (Tympanic)   Resp 18   Ht 5' (1.524 m)   Wt 132 lb 14.4 oz (60.3 kg)   LMP 10/14/2022 (Exact Date)   BMI 25.96 kg/m  Filed Weights   10/29/22 0842  Weight: 132 lb 14.4 oz (60.3 kg)  Estimated body mass index is 25.96 kg/m as calculated from the following:   Height as of this encounter: 5' (1.524 m).   Weight as of this encounter: 132 lb 14.4 oz (60.3 kg).  LABS: CBC:    Component Value Date/Time   WBC 3.5 (L) 10/29/2022 0832   WBC 8.0 10/17/2022 1814   HGB 9.5 (L) 10/29/2022 0832   HGB 11.3 (L) 02/06/2012 1022   HCT 32.7 (L) 10/29/2022 0832   HCT 34.1 (L) 02/08/2012 0514   PLT 108 (L) 10/29/2022 0832   PLT 169 02/06/2012 1022   MCV 70.2 (L) 10/29/2022 0832   MCV 88 02/06/2012 1022   NEUTROABS 2.0 10/29/2022 0832   NEUTROABS 6.5 02/06/2012 1022   LYMPHSABS 1.1 10/29/2022 0832   LYMPHSABS 1.4 02/06/2012 1022   MONOABS 0.3 10/29/2022 0832   MONOABS 0.7 02/06/2012 1022   EOSABS 0.0 10/29/2022 0832   EOSABS 0.1 02/06/2012 1022   BASOSABS 0.0 10/29/2022 0832   BASOSABS 0.0 02/06/2012 1022   Comprehensive Metabolic Panel:    Component Value Date/Time   NA 135 10/25/2022  0818   NA 138 12/04/2011 1032   K 3.6 10/25/2022 0818   K 3.8 12/04/2011 1032   CL 107 10/25/2022 0818   CL 106 12/04/2011 1032   CO2 24 10/25/2022 0818   CO2 24 12/04/2011 1032   BUN 9 10/25/2022 0818   BUN 5 (L) 12/04/2011 1032   CREATININE 0.56 10/25/2022 0818   CREATININE 0.38 (L) 12/04/2011 1032   GLUCOSE 118 (H) 10/25/2022 0818   GLUCOSE 77 12/04/2011 1032   CALCIUM 9.0 10/25/2022 0818   CALCIUM 8.5 12/04/2011 1032   AST 20 10/25/2022 0818   ALT 12 10/25/2022 0818   ALT 17 12/04/2011 1032   ALKPHOS 75 10/25/2022 0818   ALKPHOS 79 12/04/2011 1032   BILITOT 0.4 10/25/2022 0818   PROT 7.2 10/25/2022 0818   PROT 6.8 12/04/2011 1032   ALBUMIN 3.8 10/25/2022 0818   ALBUMIN 2.8 (L) 12/04/2011 1032    RADIOGRAPHIC STUDIES: DG Chest Port 1 View  Result Date: 10/17/2022 CLINICAL DATA:  Port-A-Cath placement EXAM: PORTABLE CHEST 1 VIEW COMPARISON:  None. FINDINGS: Right chest wall port with tip at the expected area of the upper SVC. Cardiac and mediastinal contours are within normal limits for AP technique. Low lung volumes. Lungs are clear. No large pleural effusion or evidence of pneumothorax. IMPRESSION: Right chest wall port with tip at the the upper SVC. Electronically Signed   By: Allegra Lai M.D.   On: 10/17/2022 19:48   DG C-Arm 1-60 Min-No Report  Result Date: 10/17/2022 Fluoroscopy was utilized by the requesting physician.  No radiographic interpretation.   ECHOCARDIOGRAM COMPLETE  Result Date: 10/17/2022    ECHOCARDIOGRAM REPORT   Patient Name:   LEATTA JUMAN Date of Exam: 10/17/2022 Medical Rec #:  [id number]       Height:       60.0 in Accession #:    [id number]      Weight:       135.4 lb Date of Birth:  Dec 10, 1985        BSA:          1.581 m Patient Age:    37 years        BP:           113/64 mmHg Patient Gender: F               HR:  62 bpm. Exam Location:  [facility] Procedure: 2D Echo, Cardiac Doppler, Color Doppler and Strain Analysis Indications:     Chemo  Z09  History:         Patient has no prior history of Echocardiogram examinations. No                  cardiac history on file.  Sonographer:     Cristela Blue Referring Phys:  [id number] ZHOU YU Diagnosing Phys: Julien Nordmann MD  Sonographer Comments: Global longitudinal strain was attempted. IMPRESSIONS  1. Left ventricular ejection fraction, by estimation, is 60 to 65%. The left ventricle has normal function. The left ventricle has no regional wall motion abnormalities. Left ventricular diastolic parameters were normal.  2. Right ventricular systolic function is normal. The right ventricular size is normal.  3. The mitral valve is normal in structure. No evidence of mitral valve regurgitation. No evidence of mitral stenosis.  4. The aortic valve is tricuspid. Aortic valve regurgitation is not visualized. No aortic stenosis is present.  5. The inferior vena cava is normal in size with greater than 50% respiratory variability, suggesting right atrial pressure of 3 mmHg. FINDINGS  Left Ventricle: Left ventricular ejection fraction, by estimation, is 60 to 65%. The left ventricle has normal function. The left ventricle has no regional wall motion abnormalities. The left ventricular internal cavity size was normal in size. There is  no left ventricular hypertrophy. Left ventricular diastolic parameters were normal. Right Ventricle: The right ventricular size is normal. No increase in right ventricular wall thickness. Right ventricular systolic function is normal. Left Atrium: Left atrial size was normal in size. Right Atrium: Right atrial size was normal in size. Pericardium: There is no evidence of pericardial effusion. Mitral Valve: The mitral valve is normal in structure. No evidence of mitral valve regurgitation. No evidence of mitral valve stenosis. MV peak gradient, 4.4 mmHg. The mean mitral valve gradient is 1.0 mmHg. Tricuspid Valve: The tricuspid valve is normal in structure. Tricuspid valve regurgitation is not  demonstrated. No evidence of tricuspid stenosis. Aortic Valve: The aortic valve is tricuspid. Aortic valve regurgitation is not visualized. No aortic stenosis is present. Aortic valve mean gradient measures 3.0 mmHg. Aortic valve peak gradient measures 5.7 mmHg. Aortic valve area, by VTI measures 3.13 cm. Pulmonic Valve: The pulmonic valve was normal in structure. Pulmonic valve regurgitation is not visualized. No evidence of pulmonic stenosis. Aorta: The aortic root is normal in size and structure. Venous: The inferior vena cava is normal in size with greater than 50% respiratory variability, suggesting right atrial pressure of 3 mmHg. IAS/Shunts: No atrial level shunt detected by color flow Doppler.  LEFT VENTRICLE PLAX 2D LVIDd:         4.10 cm   Diastology LVIDs:         2.40 cm   LV e' medial:    12.80 cm/s LV PW:         0.80 cm   LV E/e' medial:  8.2 LV IVS:        0.90 cm   LV e' lateral:   13.30 cm/s LVOT diam:     2.10 cm   LV E/e' lateral: 7.9 LV SV:         78 LV SV Index:   49 LVOT Area:     3.46 cm  RIGHT VENTRICLE RV Basal diam:  3.60 cm RV Mid diam:    2.70 cm RV S prime:     12.60 cm/s  LEFT ATRIUM             Index        RIGHT ATRIUM          Index LA diam:        2.80 cm 1.77 cm/m   RA Area:     8.25 cm LA Vol (A2C):   28.3 ml 17.90 ml/m  RA Volume:   13.40 ml 8.47 ml/m LA Vol (A4C):   40.3 ml 25.48 ml/m LA Biplane Vol: 36.4 ml 23.02 ml/m  AORTIC VALVE AV Area (Vmax):    2.94 cm AV Area (Vmean):   3.05 cm AV Area (VTI):     3.13 cm AV Vmax:           119.00 cm/s AV Vmean:          81.300 cm/s AV VTI:            0.250 m AV Peak Grad:      5.7 mmHg AV Mean Grad:      3.0 mmHg LVOT Vmax:         101.00 cm/s LVOT Vmean:        71.600 cm/s LVOT VTI:          0.226 m LVOT/AV VTI ratio: 0.90  AORTA Ao Root diam: 2.40 cm MITRAL VALVE                TRICUSPID VALVE MV Area (PHT): 3.05 cm     TR Peak grad:   13.4 mmHg MV Area VTI:   2.47 cm     TR Vmax:        183.00 cm/s MV Peak grad:  4.4  mmHg MV Mean grad:  1.0 mmHg     SHUNTS MV Vmax:       1.05 m/s     Systemic VTI:  0.23 m MV Vmean:      47.5 cm/s    Systemic Diam: 2.10 cm MV Decel Time: 249 msec MV E velocity: 105.00 cm/s MV A velocity: 62.90 cm/s MV E/A ratio:  1.67 Julien Nordmann MD Electronically signed by Julien Nordmann MD Signature Date/Time: 10/17/2022/11:30:44 AM    Final    MR BREAST BILATERAL W WO CONTRAST INC CAD  Result Date: 10/17/2022 CLINICAL DATA:  37 year old female who presented with a palpable lump in the left breast in August of 2024. 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 A left axillary lymph node was biopsied demonstrating reactive changes, without evidence of malignancy. The patient has strong family history of breast cancer. EXAM: BILATERAL BREAST MRI WITH AND WITHOUT CONTRAST TECHNIQUE: Multiplanar, multisequence MR images of both breasts were obtained prior to and following the intravenous administration of 6 ml of Gadavist Three-dimensional MR images were rendered by post-processing of the original MR data on an independent workstation. The three-dimensional MR images were interpreted, and findings are reported in the following complete MRI report for this study. Three dimensional images were evaluated at the independent interpreting workstation using the DynaCAD thin client. COMPARISON:  No prior MRI available for comparison. Correlation made with prior mammogram and ultrasound images. FINDINGS: Breast composition: c. Heterogeneous fibroglandular tissue. Background parenchymal enhancement: Minimal Right breast: No mass or abnormal enhancement. Left breast: In the lower-inner quadrant of the left breast, there is an enhancing mass with surrounding non mass enhancement measuring 3.8 cm. Additionally, there are several scattered small scattered enhancing masses in the inferior anterior left breast, in the  lower outer and lower inner  quadrants (series 14, images 30-36). Together with the area of biopsy proven malignancy, the abnormal enhancement spans approximately 6.7 cm. The biopsy marking clip from the ultrasound-guided biopsy is positioned centrally within this broader area of abnormal enhancement. Lymph nodes: The axillae were not included within the field of view on imaging for adequate assessment of the lymph nodes. No abnormal lymph nodes seen along the internal mammary chain. Ancillary findings:  None. IMPRESSION: 1. In total, there is approximately 6.7 cm of abnormal enhancement, predominantly in the lower-inner inferior left breast, with the ultrasound-guided biopsy marking clip centrally positioned within the enhancement. There are scattered small enhancing masses in the lower inner and lower outer anterior left (included in the measurement above). 2.  No evidence of right breast malignancy. 3. The axillary lymph nodes are not included within the field of view of this MRI for adequate assessment of lymphadenopathy. RECOMMENDATION: MRI guided biopsy is recommended for the anterior and posterior aspect of non mass enhancement in the right breast to establish extent of disease, if the patient is considering breast conservation. BI-RADS CATEGORY  4: Suspicious. Electronically Signed   By: Frederico Hamman M.D.   On: 10/17/2022 10:40   [place] LT BREAST BX W LOC DEV 1ST LESION IMG BX SPEC US GUIDE  Addendum Date: 10/03/2022   ADDENDUM REPORT: 10/03/2022 13:35 ADDENDUM: PATHOLOGY revealed: Site 1. Breast, left, needle core biopsy, 8 o'clock 6 cmfn, heart clip - INVASIVE MAMMARY CARCINOMA, NO SPECIAL TYPE. - OVERALL GRADE: 3 - LYMPHOVASCULAR INVASION: NOT IDENTIFIED. - CANCER LENGTH: 11 MM - CALCIFICATIONS: NOT IDENTIFIED. - DUCTAL CARCINOMA IN SITU: PRESENT, HIGH-GRADE. Pathology results are CONCORDANT with imaging findings, per Dr. Meda Klinefelter. PATHOLOGY revealed: Site 2. Lymph node, needle/core biopsy, left axillary, hydromark -  LYMPH NODE WITH REACTIVE CHANGES; NEGATIVE FOR MALIGNANCY. Pathology results are CONCORDANT with imaging findings, per Dr. Meda Klinefelter. Pathology results and recommendations below were discussed with patient and her mother Nida Hoffpauir) by telephone on 10/03/2022. Patient reported biopsy site within normal limits with slight tenderness at the site. Post biopsy care instructions were reviewed, questions were answered and my direct phone number was provided to patient. Patient was instructed to call [facility] if any concerns or questions arise related to the biopsy. RECOMMENDATIONS: 1. Surgical and oncological consultation. Request for surgical and oncological consultation relayed to Irving Shows RN at [facility] by Randa Lynn RN on 10/03/2022. 2. Recommend pretreatment bilateral breast MRI with and without contrast to determine extent of breast disease given breast density and patient's young age. Pathology results reported by Randa Lynn RN on 10/03/2022. Electronically Signed   By: Meda Klinefelter M.D.   On: 10/03/2022 13:35   Result Date: 10/03/2022 CLINICAL DATA:  Palpable LEFT breast mass. Indeterminate LEFT axillary lymph node. Strong family history of breast cancer. EXAM: ULTRASOUND GUIDED LEFT BREAST CORE NEEDLE BIOPSY [place] AXILLARY NODE CORE BIOPSY LEFT COMPARISON:  Previous exam(s). PROCEDURE: I met with the patient and we discussed the procedure of ultrasound-guided biopsy, including benefits and alternatives. We discussed the high likelihood of a successful procedure. We discussed the risks of the procedure, including infection, bleeding, tissue injury, clip migration, and inadequate sampling. Informed written consent was given. The usual time-out protocol was performed immediately prior to the procedure. Site 1: LEFT breast 8 o'clock 6 cm from the nipple. Lesion quadrant: Lower inner quadrant Using sterile technique and 1% lidocaine and 1% lidocaine with epinephrine  as local anesthetic, under  direct ultrasound visualization, a 14 gauge spring-loaded device was used to perform biopsy of a mass at 8 o'clock using a medial approach. At the conclusion of the procedure a heart shaped tissue marker clip was deployed into the biopsy cavity. Follow up 2 view mammogram was performed and dictated separately. Site 2: LEFT axillary lymph node Using sterile technique and 1% lidocaine and 1% lidocaine with epinephrine as local anesthetic, under direct ultrasound visualization, a 14 gauge spring-loaded device was used to perform biopsy of a LEFT axillary lymph node using a lateral approach. At the conclusion of the procedure a HYDROMARK 4 butterfly shaped tissue marker clip was deployed into the biopsy cavity. Follow up 2 view mammogram was performed and dictated separately. IMPRESSION: Ultrasound guided biopsy of a LEFT breast mass and LEFT axillary lymph node. No apparent complications. Electronically Signed: By: Meda Klinefelter M.D. On: 10/02/2022 09:58   [place] AXILLARY NODE CORE BIOPSY LEFT  Addendum Date: 10/03/2022   ADDENDUM REPORT: 10/03/2022 13:35 ADDENDUM: PATHOLOGY revealed: Site 1. Breast, left, needle core biopsy, 8 o'clock 6 cmfn, heart clip - INVASIVE MAMMARY CARCINOMA, NO SPECIAL TYPE. - OVERALL GRADE: 3 - LYMPHOVASCULAR INVASION: NOT IDENTIFIED. - CANCER LENGTH: 11 MM - CALCIFICATIONS: NOT IDENTIFIED. - DUCTAL CARCINOMA IN SITU: PRESENT, HIGH-GRADE. Pathology results are CONCORDANT with imaging findings, per Dr. Meda Klinefelter. PATHOLOGY revealed: Site 2. Lymph node, needle/core biopsy, left axillary, hydromark - LYMPH NODE WITH REACTIVE CHANGES; NEGATIVE FOR MALIGNANCY. Pathology results are CONCORDANT with imaging findings, per Dr. Meda Klinefelter. Pathology results and recommendations below were discussed with patient and her mother Audryna Propson) by telephone on 10/03/2022. Patient reported biopsy site within normal limits with slight tenderness at the site.  Post biopsy care instructions were reviewed, questions were answered and my direct phone number was provided to patient. Patient was instructed to call [facility] if any concerns or questions arise related to the biopsy. RECOMMENDATIONS: 1. Surgical and oncological consultation. Request for surgical and oncological consultation relayed to Irving Shows RN at [facility] by Randa Lynn RN on 10/03/2022. 2. Recommend pretreatment bilateral breast MRI with and without contrast to determine extent of breast disease given breast density and patient's young age. Pathology results reported by Randa Lynn RN on 10/03/2022. Electronically Signed   By: Meda Klinefelter M.D.   On: 10/03/2022 13:35   Result Date: 10/03/2022 CLINICAL DATA:  Palpable LEFT breast mass. Indeterminate LEFT axillary lymph node. Strong family history of breast cancer. EXAM: ULTRASOUND GUIDED LEFT BREAST CORE NEEDLE BIOPSY [place] AXILLARY NODE CORE BIOPSY LEFT COMPARISON:  Previous exam(s). PROCEDURE: I met with the patient and we discussed the procedure of ultrasound-guided biopsy, including benefits and alternatives. We discussed the high likelihood of a successful procedure. We discussed the risks of the procedure, including infection, bleeding, tissue injury, clip migration, and inadequate sampling. Informed written consent was given. The usual time-out protocol was performed immediately prior to the procedure. Site 1: LEFT breast 8 o'clock 6 cm from the nipple. Lesion quadrant: Lower inner quadrant Using sterile technique and 1% lidocaine and 1% lidocaine with epinephrine as local anesthetic, under direct ultrasound visualization, a 14 gauge spring-loaded device was used to perform biopsy of a mass at 8 o'clock using a medial approach. At the conclusion of the procedure a heart shaped tissue marker clip was deployed into the biopsy cavity. Follow up 2 view mammogram was performed and dictated separately. Site 2: LEFT  axillary lymph node Using sterile technique and 1% lidocaine and 1%  lidocaine with epinephrine as local anesthetic, under direct ultrasound visualization, a 14 gauge spring-loaded device was used to perform biopsy of a LEFT axillary lymph node using a lateral approach. At the conclusion of the procedure a HYDROMARK 4 butterfly shaped tissue marker clip was deployed into the biopsy cavity. Follow up 2 view mammogram was performed and dictated separately. IMPRESSION: Ultrasound guided biopsy of a LEFT breast mass and LEFT axillary lymph node. No apparent complications. Electronically Signed: By: Meda Klinefelter M.D. On: 10/02/2022 09:58   MS CLIP PLACEMENT LEFT  Result Date: 10/02/2022 CLINICAL DATA:  Status post ultrasound-guided biopsy. EXAM: DIAGNOSTIC LEFT MAMMOGRAM POST ULTRASOUND BIOPSY COMPARISON:  Previous exam(s). FINDINGS: Mammographic images were obtained following ultrasound guided biopsy of a LEFT breast mass at 8 o'clock. The heart biopsy marking clip is in expected position at the site of biopsy. Mammographic images were obtained following ultrasound guided biopsy of a LEFT axillary lymph node. The [facility] 4 butterfly biopsy marking clip is in expected position at the site of biopsy. IMPRESSION: 1. Appropriate positioning of the heart shaped biopsy marking clip at the site of biopsy in the inner breast. 2. Appropriate positioning of the [facility] shaped biopsy marking clip at the site of biopsy in the LEFT axilla. Final Assessment: Post Procedure Mammograms for Marker Placement Electronically Signed   By: Meda Klinefelter M.D.   On: 10/02/2022 09:54    PERFORMANCE STATUS (ECOG) : 1 - Symptomatic but completely ambulatory  Review of Systems Unless otherwise noted, a complete review of systems is negative.  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  Had a lengthy conversation with patient regarding fatigue management including exercise, maximizing nighttime sleep/sleep hygiene, and nutrition.  We discussed option of trial of American ginseng.  Would not recommend stimulants at this point.  Patient denies depressive symptoms so would unlikely benefit from antidepressant.  Could consider referral to [facility].   Chronic constipation -patient averages a bowel movement weekly.  Recommended starting MiraLAX and/or Senokot  Poor oral nutrition -appears mildly dehydrated.  Will proceed with IV fluids today.  Patient is pending nutrition appointment tomorrow.  Case and plan discussed with Dr. Cathie Hoops.   Patient expressed understanding and was in agreement with this plan. She also understands that She can call clinic at any time with any questions, concerns, or complaints.   Thank you for allowing me to participate in the care of this very pleasant patient.   Time Total: 25 minutes  Visit consisted of counseling and education dealing with the complex and emotionally intense issues of symptom management in the setting of serious illness.Greater than 50%  of this time was spent counseling and coordinating care related to the above assessment and plan.  Signed by: Laurette Schimke, PhD, NP-C

## 2022-10-29 NOTE — Research (Addendum)
Effectiveness of Out-of-Pocket Psychologist, forensic (CostCOM) in Cancer Patients   Research nurse met with patient briefly today to introduce the EAQ222CD "CostCom" protocol. Patient was given the [employer]Com hand out and brochure to take home with her to review / read. Research nurse will meet with patient tomorrow 10/30/2022 after her visit to review the [facility] and [employer] if she is interested. Patient was made aware that this is voluntary and her participation is not required if this isn't something she is interested in taking part in. Patient voiced understanding.  Chriss Driver, RN 10/29/22 10:47 AM  Research nurse met with patient today in the infusion room to follow up on her interest in participation in the clinical trial. Enquired if she had time to read over the information provided to her on 10/29/2022 for the study. She said she had been able to review everything. She verified understanding of the protocol. The patient states that she doesn't want to be in the study, she has too much going on right now. This nurse thanked the patient for her time and consideration of clinical trials.  Chriss Driver, RN 11/01/22 2:28 PM

## 2022-10-30 ENCOUNTER — Inpatient Hospital Stay: Payer: Medicaid Other

## 2022-10-30 ENCOUNTER — Telehealth: Payer: Self-pay

## 2022-10-30 ENCOUNTER — Encounter: Payer: Self-pay | Admitting: Licensed Clinical Social Worker

## 2022-10-30 ENCOUNTER — Inpatient Hospital Stay: Payer: Medicaid Other | Admitting: Licensed Clinical Social Worker

## 2022-10-30 VITALS — BP 114/65 | HR 77 | Temp 97.4°F | Resp 18

## 2022-10-30 DIAGNOSIS — Z800 Family history of malignant neoplasm of digestive organs: Secondary | ICD-10-CM

## 2022-10-30 DIAGNOSIS — C50919 Malignant neoplasm of unspecified site of unspecified female breast: Secondary | ICD-10-CM

## 2022-10-30 DIAGNOSIS — Z803 Family history of malignant neoplasm of breast: Secondary | ICD-10-CM

## 2022-10-30 DIAGNOSIS — Z5112 Encounter for antineoplastic immunotherapy: Secondary | ICD-10-CM | POA: Diagnosis not present

## 2022-10-30 DIAGNOSIS — Z801 Family history of malignant neoplasm of trachea, bronchus and lung: Secondary | ICD-10-CM | POA: Diagnosis not present

## 2022-10-30 DIAGNOSIS — D500 Iron deficiency anemia secondary to blood loss (chronic): Secondary | ICD-10-CM

## 2022-10-30 MED ORDER — HEPARIN SOD (PORK) LOCK FLUSH 100 UNIT/ML IV SOLN
500.0000 [IU] | Freq: Once | INTRAVENOUS | Status: AC | PRN
  Administered 2022-10-30: 500 [IU]
  Filled 2022-10-30: qty 5

## 2022-10-30 MED ORDER — SODIUM CHLORIDE 0.9 % IV SOLN
Freq: Once | INTRAVENOUS | Status: AC
  Filled 2022-10-30: qty 250

## 2022-10-30 MED ORDER — SODIUM CHLORIDE 0.9 % IV SOLN
200.0000 mg | Freq: Once | INTRAVENOUS | Status: AC
  Administered 2022-10-30: 200 mg via INTRAVENOUS
  Filled 2022-10-30: qty 200

## 2022-10-30 NOTE — Progress Notes (Signed)
REFERRING PROVIDER: [name], MD [address] [place],  [place] [address]  PRIMARY PROVIDER:  Shane Crutch, [place]  PRIMARY REASON FOR VISIT:  1. Invasive carcinoma of breast (HCC)   2. Family history of breast cancer   3. Family history of pancreatic cancer   4. Family history of stomach cancer   5. Family history of lung cancer      HISTORY OF PRESENT ILLNESS:   Penny Gomez, a 37 y.o. female, was seen for a [place] cancer genetics consultation at the request of Dr. Cathie Hoops due to a personal and family history of breast cancer.  Penny Gomez presents to clinic today to discuss the possibility of a hereditary predisposition to cancer, genetic testing, and to further clarify her future cancer risks, as well as potential cancer risks for family members.   In 2024, at the age of 35, Penny Gomez was diagnosed with invasive mammary carcinoma of the left breast, triple negative. The treatment plan currently includes neoadjuvant chemotherapy.   CANCER HISTORY:  Oncology History  Invasive carcinoma of breast (HCC)  07/24/2022 Mammogram   She noticed left breast mass for 1 month.  Bilateral diagnostic mammogram showed Suspicious palpable left breast mass 8 o'clock position. Cortically thickened left axillary lymph node   10/10/2022 Initial Diagnosis   Invasive carcinoma of breast (HCC)  10/02/2022  Left breast mass biopsy and left axillary lymph node biopsy.  Diagnosis 1. Breast, left, needle core biopsy, 8 o'clock 6 cmfn, heart clip - INVASIVE MAMMARY CARCINOMA, NO SPECIAL TYPE. - TUBULE FORMATION: SCORE 3 - NUCLEAR PLEOMORPHISM: SCORE 3 - MITOTIC COUNT: SCORE 3 - TOTAL SCORE: 9 - OVERALL GRADE: 3 - LYMPHOVASCULAR INVASION: NOT IDENTIFIED - CANCER LENGTH: 11 MM - CALCIFICATIONS: NOT IDENTIFIED - DUCTAL CARCINOMA IN SITU: PRESENT, HIGH-GRADE - ER-, PR- HER2 - [IHC 1+] 2. Lymph node, needle/core biopsy, left axillary, hydromark - LYMPH NODE WITH REACTIVE CHANGES; NEGATIVE FOR  MALIGNANCY.  Menarche at age of 42 First live birth at age of 41 OCP use: no History of hysterectomy: no Menopausal status: premenopausal History of HRT use: no History of chest radiation: no Number of previous breast biopsies:  right breast biopsy 05/20/2019 negative for malignancy Strong family history of cancer mother breast cancer diagnosed in 67s, maternal aunts x 2 breast cancer, maternal cousins breast cancer x 2, maternal grandmother pancreatic cancer.    10/10/2022 Cancer Staging   Staging form: Breast, AJCC 8th Edition - Clinical stage from 10/10/2022: Stage IIIB (cT3, cN0, cM0, G3, ER-, PR-, HER2-) - Signed by [name], MD on 10/18/2022 Stage prefix: Initial diagnosis Nuclear grade: G3 Histologic grading system: 3 grade system   10/17/2022 Imaging   Bilateral MRI breasts w wo contrast  1. In total, there is approximately 6.7 cm of abnormal enhancement, predominantly in the lower-inner inferior left breast, with the ultrasound-guided biopsy marking clip centrally positioned within the enhancement. There are scattered small enhancing masses in the lower inner and lower outer anterior left (included in the measurement above).   2.  No evidence of right breast malignancy.   3. The axillary lymph nodes are not included within the field of view of this MRI for adequate assessment of lymphadenopathy.    10/17/2022 Procedure   S/p medi port placement.    10/25/2022 - 10/25/2022 Chemotherapy   Patient is on Treatment Plan : BREAST ADJUVANT DOSE DENSE AC q14d / PACLitaxel q7d     10/25/2022 -  Chemotherapy   Patient is on Treatment Plan :  BREAST Pembrolizumab (200) D1 + Carboplatin (5) D1 + Paclitaxel (80) D1,8,15 q21d X 4 cycles / Pembrolizumab (200) D1 + AC D1 q21d x 4 cycles      RISK FACTORS:  Menarche was at age 65.  First live birth at age 27.  Ovaries intact: yes.  Hysterectomy: no.  Menopausal status: premenopausal.  Previous right breast biopsy  Past Medical History:   Diagnosis Date   Breast lump    Fibroid, uterine    Microcytic anemia    STD (sexually transmitted disease)    From Medical Hx;   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    Past Surgical History:  Procedure Laterality Date   BREAST BIOPSY Right 05/20/2019   Affirm Bx- X- clip, neg   BREAST BIOPSY Left 10/02/2022   [place] Bx, path pending   BREAST BIOPSY Left 10/02/2022   [place] Bx Node- path pending   BREAST BIOPSY Left 10/02/2022   [place] LT BREAST BX W LOC DEV 1ST LESION IMG BX SPEC US GUIDE 10/02/2022 [facility]   CESAREAN SECTION     LAPAROSCOPIC BILATERAL SALPINGECTOMY Bilateral 02/09/2022   Procedure: LAPAROSCOPIC BILATERAL SALPINGECTOMY;  Surgeon: Hildred Laser, MD;  Location: [facility] ORS;  Service: Gynecology;  Laterality: Bilateral;   PORTACATH PLACEMENT Right 10/17/2022   Procedure: INSERTION PORT-A-CATH;  Surgeon: Henrene Dodge, MD;  Location: [facility] ORS;  Service: General;  Laterality: Right;   TUBAL LIGATION      FAMILY HISTORY:  We obtained a detailed, 4-generation family history.  Significant diagnoses are listed below: Family History  Problem Relation Age of Onset   Breast cancer Mother 89       no GT   Breast cancer Maternal Aunt 33   Breast cancer Maternal Aunt    Lung cancer Maternal Uncle    Lung cancer Maternal Uncle    Bone cancer Paternal Aunt    Pancreatic cancer Maternal Grandmother 59   Lung cancer Maternal Grandfather 37   Breast cancer Cousin 40   Breast cancer Other 33       gene pos?   Penny Gomez has 1 son, 1 and 1 daughter 40. She has 1 brother, 17, no cancerse.  Penny Gomez mother had breast cancer at 63 and is living at 68, she has not had genetic testing. Patient has 2 maternal aunts who also had breast cancer. One had it in her 30s and then had lung cancer in her 17s. The other had it at 33 and passed at 63. Two maternal uncles had lung cancer and history of smoking. A maternal cousin had breast cancer in her 27s, and her daughter had breast  cancer at 38 and is living at 46 and reportedly had positive genetic testing, unknown gene. Patient will try to get copy of report. Maternal grandmother had pancreatic cancer at 62. Her sister's son also had pancreatic cancer. Maternal great grandmother had stomach cancer. Maternal grandfather died of lung cancer at 69.  Ms. Bollen's father died at 76. A paternal aunt had bone cancer. No other known cancers on this side of the family.  Ms. Gieseking is unaware of previous family history of genetic testing for hereditary cancer risks. There is no reported Ashkenazi Jewish ancestry. There is no known consanguinity.    GENETIC COUNSELING ASSESSMENT: Ms. Wane is a 37 y.o. female with a personal and family history of breast cancer which is somewhat suggestive of a hereditary cancer syndrome and predisposition to cancer. We, therefore, discussed and recommended the following  at today's visit.   DISCUSSION: We discussed that approximately 10% of breast cancer is hereditary. Most cases of hereditary breast cancer are associated with BRCA1/BRCA2 genes which can also increase risk for pancreatic cancer, although there are other genes associated with hereditary cancer as well. Cancers and risks are gene specific. We discussed that testing is beneficial for several reasons including knowing about cancer risks, identifying potential screening and risk-reduction options that may be appropriate, and to understand if other family members could be at risk for cancer and allow them to undergo genetic testing.   We reviewed the characteristics, features and inheritance patterns of hereditary cancer syndromes. We also discussed genetic testing, including the appropriate family members to test, the process of testing, insurance coverage and turn-around-time for results. We discussed the implications of a negative, positive and/or variant of uncertain significant result. We recommended Ms. Bracy pursue genetic testing for the  [facility] Multi-Cancer+RNA gene panel.   Based on Ms. Walkowski's personal and family history of cancer, she meets medical criteria for genetic testing. Despite that she meets criteria, she may still have an out of pocket cost.   PLAN: After considering the risks, benefits, and limitations, Ms. Kilbride provided informed consent to pursue genetic testing. She will have her blood drawn on 9/20 and the blood sample will be sent to [facility] for analysis of the Multi-Cancer+RNA panel. Results should be available within approximately 2-3 weeks' time, at which point they will be disclosed by telephone to Ms. Arvie, as will any additional recommendations warranted by these results. Ms. Feasel will receive a summary of her genetic counseling visit and a copy of her results once available. This information will also be available in Epic.   Ms. Garcia's questions were answered to her satisfaction today. Our contact information was provided should additional questions or concerns arise. Thank you for the referral and allowing [place] to share in the care of your patient.   Lacy Duverney, MS, [facility] Genetic Counselor [email or web address] Phone: [phone]  The patient was seen for a total of 30 minutes in face-to-face genetic counseling. Patient's mother was also present.  Dr. Blake Divine was available for discussion regarding this case.   _______________________________________________________________________ For Office Staff:  Number of people involved in session: 2 Was an Intern/ student involved with case: no

## 2022-10-30 NOTE — Progress Notes (Signed)
Pt has been educated and understands. Pt declined to stay 30 mins after iron infusion. VSS.

## 2022-10-30 NOTE — Telephone Encounter (Signed)
Documentation and Rx faxed to [employer].

## 2022-10-30 NOTE — Telephone Encounter (Signed)
-----   Message from Alphonse Guild sent at 10/30/2022  2:05 PM EDT ----- Dr Cathie Hoops,  [place] is now covering adult oral nutrition supplements (ie boost/ensure) for patients.  [place] Medicaid patients are eligible for coverage after submitting a prior authorization.    In order to submit the authorization, fax the following to [employer] [phone])  Signed Rx from provider (Recommend Boost plus BID)  Last chart note from MD  Dietitian note   Demographic sheet  Approval could take up to 2 weeks.  Once approved, [employer] will contact patient directly and ship out one month's supply of oral nutrition supplements.  Patient is responsible for contacting [employer] and reordering each month.    [place]

## 2022-10-30 NOTE — Progress Notes (Signed)
Nutrition Assessment   Reason for Assessment:  Poor appetite, on chemo  ASSESSMENT:  37 year old female with triple negative breast cancer.  Patient receiving neoadjuvant chemotherapy taxol, carboplatin, and keytruda.  Also with iron deficiency anemia receiving venofer.  Met with patient and mother in clinic.  Reports that appetite was decreased for about 2 days after chemotherapy. Patient felt queasy but did not vomit.  Did not take nausea medication. Was able to eat soup during this time.  Yesterday able to eat Dione Plover 2 soft tacos (hamburger).  Also ate frozen chicken and rice meal for dinner.  Drank 1/2 bottle of ensure.  Has been drinking water and tea, soda does not taste good.  Having some taste alterations especially with meat and eggs.      Nutrition Focused Physical Exam:   Orbital Region: normal Buccal Region: normal Upper Arm Region: normal Thoracic and Lumbar Region: normal Temple Region: normal Clavicle Bone Region: normal Shoulder and Acromion Bone Region: normal Scapular Bone Region: normal Dorsal Hand: normal Patellar Region: normal Anterior Thigh Region: normal Posterior Calf Region: normal Edema (RD assessment): none Hair: reviewed Eyes: reviewed Mouth: reviewed Skin: reviewed Nails: reviewed   Medications: zofran, compazine   Labs: Na 132, hgb 9.5   Anthropometrics:   Height: 60 inches Weight: 133 lb 3 oz today UBW: 135 lb on 8/29 BMI: 25  4% weight loss in the last 2 weeks.     Estimated Energy Needs  Kcals: 1500-1800 Protein: 75-90 g Fluid: 1500-1800 ml   NUTRITION DIAGNOSIS: Inadequate oral intake related to cancer related treatment side effects as evidenced by 4% weight loss, poor po intake for few days following treatment, taste alterations, nausea   INTERVENTION:  Recommend ensure/boost plus shake 2 times per day.  Message sent to MD and nursing regarding submitting authorization to [employer] for ONS supplement Encouraged good  sources of protein.  Contact information provided   MONITORING, EVALUATION, GOAL: weight trends, intake   Next Visit: Friday, Sept 27 during infusion  Penny Gomez, RD, LDN Registered Dietitian [phone]

## 2022-10-31 MED FILL — Dexamethasone Sodium Phosphate Inj 100 MG/10ML: INTRAMUSCULAR | Qty: 1 | Status: AC

## 2022-11-01 ENCOUNTER — Inpatient Hospital Stay (HOSPITAL_BASED_OUTPATIENT_CLINIC_OR_DEPARTMENT_OTHER): Payer: Medicaid Other | Admitting: Oncology

## 2022-11-01 ENCOUNTER — Encounter: Payer: Self-pay | Admitting: Surgery

## 2022-11-01 ENCOUNTER — Encounter: Payer: Self-pay | Admitting: Oncology

## 2022-11-01 ENCOUNTER — Ambulatory Visit (INDEPENDENT_AMBULATORY_CARE_PROVIDER_SITE_OTHER): Payer: Medicaid Other | Admitting: Surgery

## 2022-11-01 ENCOUNTER — Inpatient Hospital Stay: Payer: Medicaid Other

## 2022-11-01 VITALS — BP 112/64 | HR 74 | Temp 96.9°F | Resp 18 | Wt 133.2 lb

## 2022-11-01 VITALS — BP 114/66 | HR 66

## 2022-11-01 VITALS — BP 119/76 | HR 81 | Temp 98.8°F | Ht 60.0 in | Wt 132.2 lb

## 2022-11-01 DIAGNOSIS — C50919 Malignant neoplasm of unspecified site of unspecified female breast: Secondary | ICD-10-CM

## 2022-11-01 DIAGNOSIS — D500 Iron deficiency anemia secondary to blood loss (chronic): Secondary | ICD-10-CM | POA: Diagnosis not present

## 2022-11-01 DIAGNOSIS — Z95828 Presence of other vascular implants and grafts: Secondary | ICD-10-CM

## 2022-11-01 DIAGNOSIS — C50312 Malignant neoplasm of lower-inner quadrant of left female breast: Secondary | ICD-10-CM

## 2022-11-01 DIAGNOSIS — Z08 Encounter for follow-up examination after completed treatment for malignant neoplasm: Secondary | ICD-10-CM | POA: Diagnosis not present

## 2022-11-01 DIAGNOSIS — Z809 Family history of malignant neoplasm, unspecified: Secondary | ICD-10-CM

## 2022-11-01 DIAGNOSIS — Z09 Encounter for follow-up examination after completed treatment for conditions other than malignant neoplasm: Secondary | ICD-10-CM

## 2022-11-01 DIAGNOSIS — Z5112 Encounter for antineoplastic immunotherapy: Secondary | ICD-10-CM | POA: Diagnosis not present

## 2022-11-01 DIAGNOSIS — Z5111 Encounter for antineoplastic chemotherapy: Secondary | ICD-10-CM | POA: Diagnosis not present

## 2022-11-01 LAB — CBC WITH DIFFERENTIAL (CANCER CENTER ONLY)
Abs Immature Granulocytes: 0.08 10*3/uL — ABNORMAL HIGH (ref 0.00–0.07)
Basophils Absolute: 0 10*3/uL (ref 0.0–0.1)
Basophils Relative: 0 %
Eosinophils Absolute: 0 10*3/uL (ref 0.0–0.5)
Eosinophils Relative: 0 %
HCT: 29.6 % — ABNORMAL LOW (ref 36.0–46.0)
Hemoglobin: 8.7 g/dL — ABNORMAL LOW (ref 12.0–15.0)
Immature Granulocytes: 1 %
Lymphocytes Relative: 36 %
Lymphs Abs: 2.2 10*3/uL (ref 0.7–4.0)
MCH: 20.7 pg — ABNORMAL LOW (ref 26.0–34.0)
MCHC: 29.4 g/dL — ABNORMAL LOW (ref 30.0–36.0)
MCV: 70.3 fL — ABNORMAL LOW (ref 80.0–100.0)
Monocytes Absolute: 0.3 10*3/uL (ref 0.1–1.0)
Monocytes Relative: 5 %
Neutro Abs: 3.4 10*3/uL (ref 1.7–7.7)
Neutrophils Relative %: 58 %
Platelet Count: 203 10*3/uL (ref 150–400)
RBC: 4.21 MIL/uL (ref 3.87–5.11)
RDW: 27.1 % — ABNORMAL HIGH (ref 11.5–15.5)
WBC Count: 6 10*3/uL (ref 4.0–10.5)
nRBC: 0 % (ref 0.0–0.2)

## 2022-11-01 LAB — CMP (CANCER CENTER ONLY)
ALT: 27 U/L (ref 0–44)
AST: 21 U/L (ref 15–41)
Albumin: 3.9 g/dL (ref 3.5–5.0)
Alkaline Phosphatase: 76 U/L (ref 38–126)
Anion gap: 6 (ref 5–15)
BUN: 10 mg/dL (ref 6–20)
CO2: 24 mmol/L (ref 22–32)
Calcium: 8.8 mg/dL — ABNORMAL LOW (ref 8.9–10.3)
Chloride: 104 mmol/L (ref 98–111)
Creatinine: 0.65 mg/dL (ref 0.44–1.00)
GFR, Estimated: >60 mL/min (ref >60)
Glucose, Bld: 114 mg/dL — ABNORMAL HIGH (ref 70–99)
Potassium: 3.6 mmol/L (ref 3.5–5.1)
Sodium: 134 mmol/L — ABNORMAL LOW (ref 135–145)
Total Bilirubin: 0.3 mg/dL (ref 0.3–1.2)
Total Protein: 7.3 g/dL (ref 6.5–8.1)

## 2022-11-01 MED ORDER — HEPARIN SOD (PORK) LOCK FLUSH 100 UNIT/ML IV SOLN
500.0000 [IU] | Freq: Once | INTRAVENOUS | Status: DC | PRN
  Filled 2022-11-01: qty 5

## 2022-11-01 MED ORDER — SODIUM CHLORIDE 0.9% FLUSH
10.0000 mL | Freq: Once | INTRAVENOUS | Status: AC
  Administered 2022-11-01: 10 mL via INTRAVENOUS
  Filled 2022-11-01: qty 10

## 2022-11-01 MED ORDER — FAMOTIDINE IN NACL 20-0.9 MG/50ML-% IV SOLN
20.0000 mg | Freq: Once | INTRAVENOUS | Status: AC
  Administered 2022-11-01: 20 mg via INTRAVENOUS
  Filled 2022-11-01: qty 50

## 2022-11-01 MED ORDER — SODIUM CHLORIDE 0.9% FLUSH
10.0000 mL | INTRAVENOUS | Status: DC | PRN
  Filled 2022-11-01: qty 10

## 2022-11-01 MED ORDER — SODIUM CHLORIDE 0.9 % IV SOLN
200.0000 mg | Freq: Once | INTRAVENOUS | Status: AC
  Administered 2022-11-01: 200 mg via INTRAVENOUS
  Filled 2022-11-01: qty 200

## 2022-11-01 MED ORDER — SODIUM CHLORIDE 0.9 % IV SOLN
10.0000 mg | Freq: Once | INTRAVENOUS | Status: AC
  Administered 2022-11-01: 10 mg via INTRAVENOUS
  Filled 2022-11-01: qty 10

## 2022-11-01 MED ORDER — DIPHENHYDRAMINE HCL 50 MG/ML IJ SOLN
50.0000 mg | Freq: Once | INTRAMUSCULAR | Status: AC
  Administered 2022-11-01: 50 mg via INTRAVENOUS
  Filled 2022-11-01: qty 1

## 2022-11-01 MED ORDER — HEPARIN SOD (PORK) LOCK FLUSH 100 UNIT/ML IV SOLN
500.0000 [IU] | Freq: Once | INTRAVENOUS | Status: AC
  Administered 2022-11-01: 500 [IU] via INTRAVENOUS
  Filled 2022-11-01: qty 5

## 2022-11-01 MED ORDER — SODIUM CHLORIDE 0.9 % IV SOLN
80.0000 mg/m2 | Freq: Once | INTRAVENOUS | Status: AC
  Administered 2022-11-01: 126 mg via INTRAVENOUS
  Filled 2022-11-01: qty 21

## 2022-11-01 MED ORDER — SODIUM CHLORIDE 0.9 % IV SOLN
Freq: Once | INTRAVENOUS | Status: AC
  Filled 2022-11-01: qty 250

## 2022-11-01 NOTE — Progress Notes (Unsigned)
11/01/2022  HPI: Penny Gomez is a 37 y.o. female s/p right internal jugular port-a-cath placement on 10/17/22.  She presents today for follow up.  She reports she's been doing well and has already started with neoadjuvant chemotherapy for her triple negative left breast cancer.  She denies any troubles with the port site itself and has not had any issues with accessing the port.  Vital signs: BP 119/76 (BP Location: Right Arm, Patient Position: Sitting, Cuff Size: Small)   Pulse 81   Temp 98.8 F (37.1 C) (Oral)   Ht 5' (1.524 m)   Wt 132 lb 3.2 oz (60 kg)   LMP 10/14/2022 (Exact Date)   SpO2 100%   BMI 25.82 kg/m    Physical Exam: Constitutional:  No acute distress Skin:  Right chest port-a-cath in place without any hematoma or seroma.  Skin is healing well, with minimal areas of very shallow and superficial dehiscence, likely where the dermabond peeled off.  No evidence of infection, no drainage or erythema.  Assessment/Plan: This is a 37 y.o. female s/p right internal jugular port-a-cath placement.  --Patient is doing well, without any complications from her surgery and already started her chemo treatment.   --Discussed with the patient that as she's approaching the end of her neoadjuvant treatment, we'll see her back so we can discuss surgical management of her cancer.   Howie Ill, MD [facility]

## 2022-11-01 NOTE — Assessment & Plan Note (Signed)
Chemotherapy plan as listed above

## 2022-11-01 NOTE — Patient Instructions (Signed)
[place] [facility]  Discharge Instructions: Thank you for choosing [facility] to provide your oncology and hematology care.  If you have a lab appointment with the Cancer Center, please go directly to the Cancer Center and check in at the registration area.  Wear comfortable clothing and clothing appropriate for easy access to any Portacath or PICC line.   We strive to give you quality time with your provider. You may need to reschedule your appointment if you arrive late (15 or more minutes).  Arriving late affects you and other patients whose appointments are after yours.  Also, if you miss three or more appointments without notifying the office, you may be dismissed from the clinic at the provider's discretion.      For prescription refill requests, have your pharmacy contact our office and allow 72 hours for refills to be completed.    Today you received the following chemotherapy and/or immunotherapy agents- taxol, venofer      To help prevent nausea and vomiting after your treatment, we encourage you to take your nausea medication as directed.  BELOW ARE SYMPTOMS THAT SHOULD BE REPORTED IMMEDIATELY: *FEVER GREATER THAN 100.4 F (38 C) OR HIGHER *CHILLS OR SWEATING *NAUSEA AND VOMITING THAT IS NOT CONTROLLED WITH YOUR NAUSEA MEDICATION *UNUSUAL SHORTNESS OF BREATH *UNUSUAL BRUISING OR BLEEDING *URINARY PROBLEMS (pain or burning when urinating, or frequent urination) *BOWEL PROBLEMS (unusual diarrhea, constipation, pain near the anus) TENDERNESS IN MOUTH AND THROAT WITH OR WITHOUT PRESENCE OF ULCERS (sore throat, sores in mouth, or a toothache) UNUSUAL RASH, SWELLING OR PAIN  UNUSUAL VAGINAL DISCHARGE OR ITCHING   Items with * indicate a potential emergency and should be followed up as soon as possible or go to the Emergency Department if any problems should occur.  Please show the CHEMOTHERAPY ALERT CARD or IMMUNOTHERAPY ALERT CARD at  check-in to the Emergency Department and triage nurse.  Should you have questions after your visit or need to cancel or reschedule your appointment, please contact [place] [facility]  [phone] and follow the prompts.  Office hours are 8:00 a.m. to 4:30 p.m. Monday - Friday. Please note that voicemails left after 4:00 p.m. may not be returned until the following business day.  We are closed weekends and major holidays. You have access to a nurse at all times for urgent questions. Please call the main number to the clinic [phone] and follow the prompts.  For any non-urgent questions, you may also contact your provider using MyChart. We now offer e-Visits for anyone 52 and older to request care online for non-urgent symptoms. For details visit [email or web address].   Also download the MyChart app! Go to the app store, search "MyChart", open the app, select [place], and log in with your MyChart username and password.

## 2022-11-01 NOTE — Assessment & Plan Note (Signed)
Labs are reviewed and discussed with patient. Lab Results  Component Value Date   HGB 8.7 (L) 11/01/2022   TIBC 511 (H) 10/11/2022   IRONPCTSAT 3 (L) 10/11/2022   FERRITIN 2 (L) 10/11/2022    Continue planned venofer for additional doses.

## 2022-11-01 NOTE — Patient Instructions (Signed)

## 2022-11-01 NOTE — Progress Notes (Signed)
Hematology/Oncology Consult Note [phone] [phone]    CHIEF COMPLAINTS/PURPOSE OF CONSULTATION:  Left triple negative breast cancer.   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  Images and pathology results were reviewed and discussed with patient.  MRI showed  6.7 cm of abnormal enhancement, predominantly in the lower-inner inferior left breast, with the ultrasound-guided biopsy marking clip centrally positioned within the enhancement. Suspicious for involvement by cancer, cT3 disease.  Patient desires mastectomy. Patient has no desire of preserving fertility.  Currently on neoadjuvant chemotherapy with Carboplatin, taxol Keytruda followed by [place] Q3 weeks x 4 Rationale and side effects were reviewed with patient.  Discussed about antiemetics instruction.  She agrees with the plan.  Proceed with cycle 1 D8 taxol. She will return next week for D15 Taxol, followed by D16-18 GCSF    Family history of cancer Refer to genetic counseling. Genetic test results are pending.   IDA (iron deficiency anemia) Labs are reviewed and discussed with patient. Lab Results  Component Value Date   HGB 8.7 (L) 11/01/2022   TIBC 511 (H) 10/11/2022   IRONPCTSAT 3 (L) 10/11/2022   FERRITIN 2 (L) 10/11/2022    Continue planned venofer for additional doses.   Encounter for antineoplastic chemotherapy Chemotherapy plan as listed above.   Orders Placed This Encounter  Procedures   Pregnancy, urine    Standing Status:   Future    Standing Expiration Date:   11/15/2023   CBC with Differential (Cancer Center Only)    Standing Status:   Future    Standing Expiration Date:   11/15/2023   CMP (Cancer Center  only)    Standing Status:   Future    Standing Expiration Date:   11/15/2023   T4    Standing Status:   Future    Standing Expiration Date:   11/15/2023   TSH    Standing Status:   Future    Standing Expiration Date:   11/15/2023   CBC with Differential (Cancer Center Only)    Standing Status:   Future    Standing Expiration Date:   11/22/2023   CMP (Cancer Center only)    Standing Status:   Future    Standing Expiration Date:   11/22/2023   Pregnancy, urine    Standing Status:   Future    Standing Expiration Date:   11/29/2023   CBC with Differential (Cancer Center Only)    Standing Status:   Future    Standing Expiration Date:   11/29/2023   CMP (Cancer Center only)    Standing Status:   Future    Standing Expiration Date:   11/29/2023   Follow-up  1 week lab  chemotherapy. 2 weeks lab MD chemotherapy All questions were answered. The patient knows to call the clinic with any problems, questions or concerns.  [name], MD, PhD [facility] Hematology Oncology 11/01/2022    HISTORY OF PRESENTING ILLNESS:  Penny Gomez 37 y.o. female presents to establish care for left triple negative breast cancer I have reviewed her chart and materials related to her cancer extensively and collaborated history with the patient. Summary of oncologic history is as follows: Oncology History  Invasive carcinoma of breast (HCC)  07/24/2022 Mammogram   She noticed left breast mass for 1 month.  Bilateral diagnostic mammogram showed Suspicious palpable left breast mass 8 o'clock position. Cortically thickened left axillary lymph node   10/10/2022 Initial Diagnosis   Invasive carcinoma of breast (HCC)  10/02/2022  Left breast mass biopsy and left axillary lymph node biopsy.  Diagnosis 1. Breast, left, needle core biopsy, 8 o'clock 6 cmfn, heart clip - INVASIVE MAMMARY CARCINOMA, NO SPECIAL TYPE. - TUBULE FORMATION: SCORE 3 - NUCLEAR PLEOMORPHISM: SCORE 3 - MITOTIC COUNT: SCORE 3 - TOTAL  SCORE: 9 - OVERALL GRADE: 3 - LYMPHOVASCULAR INVASION: NOT IDENTIFIED - CANCER LENGTH: 11 MM - CALCIFICATIONS: NOT IDENTIFIED - DUCTAL CARCINOMA IN SITU: PRESENT, HIGH-GRADE - ER-, PR- HER2 - [IHC 1+] 2. Lymph node, needle/core biopsy, left axillary, hydromark - LYMPH NODE WITH REACTIVE CHANGES; NEGATIVE FOR MALIGNANCY.  Menarche at age of 44 First live birth at age of 89 OCP use: no History of hysterectomy: no Menopausal status: premenopausal History of HRT use: no History of chest radiation: no Number of previous breast biopsies:  right breast biopsy 05/20/2019 negative for malignancy Strong family history of cancer mother breast cancer diagnosed in 31s, maternal aunts x 2 breast cancer, maternal cousins breast cancer x 2, maternal grandmother pancreatic cancer.    10/10/2022 Cancer Staging   Staging form: Breast, AJCC 8th Edition - Clinical stage from 10/10/2022: Stage IIIB (cT3, cN0, cM0, G3, ER-, PR-, HER2-) - Signed by [name], MD on 10/18/2022 Stage prefix: Initial diagnosis Nuclear grade: G3 Histologic grading system: 3 grade system   10/17/2022 Imaging   Bilateral MRI breasts w wo contrast  1. In total, there is approximately 6.7 cm of abnormal enhancement, predominantly in the lower-inner inferior left breast, with the ultrasound-guided biopsy marking clip centrally positioned within the enhancement. There are scattered small enhancing masses in the lower inner and lower outer anterior left (included in the measurement above).   2.  No evidence of right breast malignancy.   3. The axillary lymph nodes are not included within the field of view of this MRI for adequate assessment of lymphadenopathy.    10/17/2022 Procedure   S/p medi port placement.    10/25/2022 - 10/25/2022 Chemotherapy   Patient is on Treatment Plan : BREAST ADJUVANT DOSE DENSE AC q14d / PACLitaxel q7d     10/25/2022 -  Chemotherapy   Patient is on Treatment Plan : BREAST Pembrolizumab (200) D1 + Carboplatin  (5) D1 + Paclitaxel (80) D1,8,15 q21d X 4 cycles / Pembrolizumab (200) D1 + AC D1 q21d x 4 cycles      Today she was accompanied by her mother.  She tolerates chemotherapy. She denies nausea vomiting diarrhea. Denies tingling numbness.    MEDICAL HISTORY:  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    SURGICAL HISTORY: Past Surgical History:  Procedure Laterality Date   BREAST BIOPSY Right 05/20/2019   Affirm Bx- X- clip, neg   BREAST BIOPSY Left 10/02/2022   [place] Bx, path pending   BREAST BIOPSY Left 10/02/2022   [place] Bx Node- path pending   BREAST BIOPSY Left 10/02/2022   [place] LT BREAST BX W LOC DEV 1ST LESION IMG BX SPEC US GUIDE 10/02/2022 [facility]   CESAREAN SECTION     LAPAROSCOPIC BILATERAL  SALPINGECTOMY Bilateral 02/09/2022   Procedure: LAPAROSCOPIC BILATERAL SALPINGECTOMY;  Surgeon: Hildred Laser, MD;  Location: [facility];  Service: Gynecology;  Laterality: Bilateral;   PORTACATH PLACEMENT Right 10/17/2022   Procedure: INSERTION PORT-A-CATH;  Surgeon: Henrene Dodge, MD;  Location: [facility] OR[facility];  Service: General;  Laterality: Right;   TUBAL LIGATION      SOCIAL HISTORY: Social History   Socioeconomic History   Marital status: Single    Spouse name: Not on file   Number of children: 2   Years of education: Not on file   Highest education level: High school graduate  Occupational History   Not on file  Tobacco Use   Smoking status: Never   Smokeless tobacco: Never  Vaping Use   Vaping status: Never Used  Substance and Sexual Activity   Alcohol use: Yes    Comment: occ   Drug use: No   Sexual activity: Yes    Birth control/protection: Surgical  Other Topics Concern   Not on file  Social History Narrative   Not on file   Social Determinants of Health   Financial Resource Strain: Low Risk  (10/10/2022)    Overall Financial Resource Strain (CARDIA)    Difficulty of Paying Living Expenses: Not very hard  Food Insecurity: No Food Insecurity (10/10/2022)   Hunger Vital Sign    Worried About Running Out of Food in the Last Year: Never true    Ran Out of Food in the Last Year: Never true  Transportation Needs: No Transportation Needs (09/23/2022)   PRAPARE - Administrator, Civil Service (Medical): No    Lack of Transportation (Non-Medical): No  Physical Activity: Not on file  Stress: No Stress Concern Present (10/10/2022)   [employer] of Occupational Health - Occupational Stress Questionnaire    Feeling of Stress : Only a little  Social Connections: Not on file  Intimate Partner Violence: Not on file    FAMILY HISTORY: Family History  Problem Relation Age of Onset   Breast cancer Mother 43       no GT   Breast cancer Maternal Aunt 33   Breast cancer Maternal Aunt    Lung cancer Maternal Uncle    Lung cancer Maternal Uncle    Bone cancer Paternal Aunt    Pancreatic cancer Maternal Grandmother 18   Lung cancer Maternal Grandfather 76   Breast cancer Cousin 40   Breast cancer Other 33       gene pos?    ALLERGIES:  has No Known Allergies.  MEDICATIONS:  Current Outpatient Medications  Medication Sig Dispense Refill   lidocaine-prilocaine (EMLA) cream Apply 1 Application topically as needed. 30 g 11   acetaminophen (TYLENOL) 500 MG tablet Take 2 tablets (1,000 mg total) by mouth every 6 (six) hours as needed for mild pain. (Patient not taking: Reported on 10/29/2022)     ibuprofen (ADVIL) 600 MG tablet Take 1 tablet (600 mg total) by mouth every 8 (eight) hours as needed for moderate pain. (Patient not taking: Reported on 10/29/2022) 60 tablet 0   LORazepam (ATIVAN) 0.5 MG tablet Take 1 tablet (0.5 mg total) by mouth every 8 (eight) hours as needed for anxiety (nausea or vomiting). (Patient not taking: Reported on 10/29/2022) 30 tablet 0   ondansetron (ZOFRAN) 8 MG  tablet Take 1 tablet (8 mg total) by mouth See admin instructions. If needed, start on Day 3 of chemotherapy, take 1 tablet every 8 hours as needed for nausea or vomiting. (Patient  not taking: Reported on 10/25/2022) 90 tablet 0   prochlorperazine (COMPAZINE) 10 MG tablet Take 1 tablet (10 mg total) by mouth every 6 (six) hours as needed for nausea or vomiting. (Patient not taking: Reported on 10/25/2022) 90 tablet 3   No current facility-administered medications for this visit.    Review of Systems  Constitutional:  Negative for appetite change, chills, fatigue and fever.  HENT:   Negative for hearing loss and voice change.   Eyes:  Negative for eye problems.  Respiratory:  Negative for chest tightness and cough.   Cardiovascular:  Negative for chest pain.  Gastrointestinal:  Negative for abdominal distention, abdominal pain and blood in stool.  Endocrine: Negative for hot flashes.  Genitourinary:  Negative for difficulty urinating and frequency.   Musculoskeletal:  Negative for arthralgias.  Skin:  Negative for itching and rash.  Neurological:  Negative for extremity weakness.  Hematological:  Negative for adenopathy.  Psychiatric/Behavioral:  Negative for confusion.      PHYSICAL EXAMINATION: ECOG PERFORMANCE STATUS: 0 - Asymptomatic  Vitals:   11/01/22 1200  BP: 112/64  Pulse: 74  Resp: 18  Temp: (!) 96.9 F (36.1 C)  SpO2: 100%   Filed Weights   11/01/22 1200  Weight: 133 lb 3.2 oz (60.4 kg)    Physical Exam Constitutional:      General: She is not in acute distress.    Appearance: She is not diaphoretic.  HENT:     Head: Normocephalic and atraumatic.  Eyes:     General: No scleral icterus. Cardiovascular:     Rate and Rhythm: Normal rate and regular rhythm.  Pulmonary:     Effort: Pulmonary effort is normal. No respiratory distress.     Breath sounds: No wheezing.  Abdominal:     General: Bowel sounds are normal. There is no distension.     Palpations:  Abdomen is soft.  Musculoskeletal:        General: Normal range of motion.     Cervical back: Normal range of motion and neck supple.  Skin:    General: Skin is warm and dry.     Findings: No erythema.  Neurological:     Mental Status: She is alert and oriented to person, place, and time. Mental status is at baseline.     Motor: No abnormal muscle tone.  Psychiatric:        Mood and Affect: Mood and affect normal.     LABORATORY DATA:  I have reviewed the data as listed    Latest Ref Rng & Units 11/01/2022   11:43 AM 10/29/2022    8:32 AM 10/25/2022    8:18 AM  CBC  WBC 4.0 - 10.5 K/uL 6.0  3.5  5.3   Hemoglobin 12.0 - 15.0 g/dL 8.7  9.5  9.9   Hematocrit 36.0 - 46.0 % 29.6  32.7  34.9   Platelets 150 - 400 K/uL 203  108  219       Latest Ref Rng & Units 11/01/2022   11:43 AM 10/29/2022    8:32 AM 10/25/2022    8:18 AM  CMP  Glucose 70 - 99 mg/dL 604  93  540   BUN 6 - 20 mg/dL 10  14  9    Creatinine 0.44 - 1.00 mg/dL 9.81  1.91  4.78   Sodium 135 - 145 mmol/L 134  132  135   Potassium 3.5 - 5.1 mmol/L 3.6  4.3  3.6   Chloride 98 -  111 mmol/L 104  103  107   CO2 22 - 32 mmol/L 24  25  24    Calcium 8.9 - 10.3 mg/dL 8.8  8.9  9.0   Total Protein 6.5 - 8.1 g/dL 7.3  7.5  7.2   Total Bilirubin 0.3 - 1.2 mg/dL 0.3  1.1  0.4   Alkaline Phos 38 - 126 U/L 76  74  75   AST 15 - 41 U/L 21  71  20   ALT 0 - 44 U/L 27  48  12      RADIOGRAPHIC STUDIES: I have personally reviewed the radiological images as listed and agreed with the findings in the report. DG Chest Port 1 View  Result Date: 10/17/2022 CLINICAL DATA:  Port-A-Cath placement EXAM: PORTABLE CHEST 1 VIEW COMPARISON:  None. FINDINGS: Right chest wall port with tip at the expected area of the upper SVC. Cardiac and mediastinal contours are within normal limits for AP technique. Low lung volumes. Lungs are clear. No large pleural effusion or evidence of pneumothorax. IMPRESSION: Right chest wall port with tip at the the  upper SVC. Electronically Signed   By: Allegra Lai M.D.   On: 10/17/2022 19:48   DG C-Arm 1-60 Min-No Report  Result Date: 10/17/2022 Fluoroscopy was utilized by the requesting physician.  No radiographic interpretation.   ECHOCARDIOGRAM COMPLETE  Result Date: 10/17/2022    ECHOCARDIOGRAM REPORT   Patient Name:   Penny Gomez Date of Exam: 10/17/2022 Medical Rec #:  [id number]       Height:       60.0 in Accession #:    [id number]      Weight:       135.4 lb Date of Birth:  07-30-1985        BSA:          1.581 m Patient Age:    37 years        BP:           113/64 mmHg Patient Gender: F               HR:           62 bpm. Exam Location:  [facility] Procedure: 2D Echo, Cardiac Doppler, Color Doppler and Strain Analysis Indications:     Chemo Z09  History:         Patient has no prior history of Echocardiogram examinations. No                  cardiac history on file.  Sonographer:     Cristela Blue Referring Phys:  [id number] Scherrie Seneca Diagnosing Phys: Julien Nordmann MD  Sonographer Comments: Global longitudinal strain was attempted. IMPRESSIONS  1. Left ventricular ejection fraction, by estimation, is 60 to 65%. The left ventricle has normal function. The left ventricle has no regional wall motion abnormalities. Left ventricular diastolic parameters were normal.  2. Right ventricular systolic function is normal. The right ventricular size is normal.  3. The mitral valve is normal in structure. No evidence of mitral valve regurgitation. No evidence of mitral stenosis.  4. The aortic valve is tricuspid. Aortic valve regurgitation is not visualized. No aortic stenosis is present.  5. The inferior vena cava is normal in size with greater than 50% respiratory variability, suggesting right atrial pressure of 3 mmHg. FINDINGS  Left Ventricle: Left ventricular ejection fraction, by estimation, is 60 to 65%. The left ventricle has normal function. The left ventricle has no regional wall  motion abnormalities. The left  ventricular internal cavity size was normal in size. There is  no left ventricular hypertrophy. Left ventricular diastolic parameters were normal. Right Ventricle: The right ventricular size is normal. No increase in right ventricular wall thickness. Right ventricular systolic function is normal. Left Atrium: Left atrial size was normal in size. Right Atrium: Right atrial size was normal in size. Pericardium: There is no evidence of pericardial effusion. Mitral Valve: The mitral valve is normal in structure. No evidence of mitral valve regurgitation. No evidence of mitral valve stenosis. MV peak gradient, 4.4 mmHg. The mean mitral valve gradient is 1.0 mmHg. Tricuspid Valve: The tricuspid valve is normal in structure. Tricuspid valve regurgitation is not demonstrated. No evidence of tricuspid stenosis. Aortic Valve: The aortic valve is tricuspid. Aortic valve regurgitation is not visualized. No aortic stenosis is present. Aortic valve mean gradient measures 3.0 mmHg. Aortic valve peak gradient measures 5.7 mmHg. Aortic valve area, by VTI measures 3.13 cm. Pulmonic Valve: The pulmonic valve was normal in structure. Pulmonic valve regurgitation is not visualized. No evidence of pulmonic stenosis. Aorta: The aortic root is normal in size and structure. Venous: The inferior vena cava is normal in size with greater than 50% respiratory variability, suggesting right atrial pressure of 3 mmHg. IAS/Shunts: No atrial level shunt detected by color flow Doppler.  LEFT VENTRICLE PLAX 2D LVIDd:         4.10 cm   Diastology LVIDs:         2.40 cm   LV e' medial:    12.80 cm/s LV PW:         0.80 cm   LV E/e' medial:  8.2 LV IVS:        0.90 cm   LV e' lateral:   13.30 cm/s LVOT diam:     2.10 cm   LV E/e' lateral: 7.9 LV SV:         78 LV SV Index:   49 LVOT Area:     3.46 cm  RIGHT VENTRICLE RV Basal diam:  3.60 cm RV Mid diam:    2.70 cm RV S prime:     12.60 cm/s LEFT ATRIUM             Index        RIGHT ATRIUM           Index LA diam:        2.80 cm 1.77 cm/m   RA Area:     8.25 cm LA Vol (A2C):   28.3 ml 17.90 ml/m  RA Volume:   13.40 ml 8.47 ml/m LA Vol (A4C):   40.3 ml 25.48 ml/m LA Biplane Vol: 36.4 ml 23.02 ml/m  AORTIC VALVE AV Area (Vmax):    2.94 cm AV Area (Vmean):   3.05 cm AV Area (VTI):     3.13 cm AV Vmax:           119.00 cm/s AV Vmean:          81.300 cm/s AV VTI:            0.250 m AV Peak Grad:      5.7 mmHg AV Mean Grad:      3.0 mmHg LVOT Vmax:         101.00 cm/s LVOT Vmean:        71.600 cm/s LVOT VTI:          0.226 m LVOT/AV VTI ratio: 0.90  AORTA Ao Root diam: 2.40  cm MITRAL VALVE                TRICUSPID VALVE MV Area (PHT): 3.05 cm     TR Peak grad:   13.4 mmHg MV Area VTI:   2.47 cm     TR Vmax:        183.00 cm/s MV Peak grad:  4.4 mmHg MV Mean grad:  1.0 mmHg     SHUNTS MV Vmax:       1.05 m/s     Systemic VTI:  0.23 m MV Vmean:      47.5 cm/s    Systemic Diam: 2.10 cm MV Decel Time: 249 msec MV E velocity: 105.00 cm/s MV A velocity: 62.90 cm/s MV E/A ratio:  1.67 Julien Nordmann MD Electronically signed by Julien Nordmann MD Signature Date/Time: 10/17/2022/11:30:44 AM    Final    MR BREAST BILATERAL W WO CONTRAST INC CAD  Result Date: 10/17/2022 CLINICAL DATA:  37 year old female who presented with a palpable lump in the left breast in August of 2024. 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 A left axillary lymph node was biopsied demonstrating reactive changes, without evidence of malignancy. The patient has strong family history of breast cancer. EXAM: BILATERAL BREAST MRI WITH AND WITHOUT CONTRAST TECHNIQUE: Multiplanar, multisequence MR images of both breasts were obtained prior to and following the intravenous administration of 6 ml of Gadavist Three-dimensional MR images were rendered by post-processing of the original MR data on an independent workstation. The three-dimensional MR images were  interpreted, and findings are reported in the following complete MRI report for this study. Three dimensional images were evaluated at the independent interpreting workstation using the DynaCAD thin client. COMPARISON:  No prior MRI available for comparison. Correlation made with prior mammogram and ultrasound images. FINDINGS: Breast composition: c. Heterogeneous fibroglandular tissue. Background parenchymal enhancement: Minimal Right breast: No mass or abnormal enhancement. Left breast: In the lower-inner quadrant of the left breast, there is an enhancing mass with surrounding non mass enhancement measuring 3.8 cm. Additionally, there are several scattered small scattered enhancing masses in the inferior anterior left breast, in the lower outer and lower inner quadrants (series 14, images 30-36). Together with the area of biopsy proven malignancy, the abnormal enhancement spans approximately 6.7 cm. The biopsy marking clip from the ultrasound-guided biopsy is positioned centrally within this broader area of abnormal enhancement. Lymph nodes: The axillae were not included within the field of view on imaging for adequate assessment of the lymph nodes. No abnormal lymph nodes seen along the internal mammary chain. Ancillary findings:  None. IMPRESSION: 1. In total, there is approximately 6.7 cm of abnormal enhancement, predominantly in the lower-inner inferior left breast, with the ultrasound-guided biopsy marking clip centrally positioned within the enhancement. There are scattered small enhancing masses in the lower inner and lower outer anterior left (included in the measurement above). 2.  No evidence of right breast malignancy. 3. The axillary lymph nodes are not included within the field of view of this MRI for adequate assessment of lymphadenopathy. RECOMMENDATION: MRI guided biopsy is recommended for the anterior and posterior aspect of non mass enhancement in the right breast to establish extent of disease,  if the patient is considering breast conservation. BI-RADS CATEGORY  4: Suspicious. Electronically Signed   By: Frederico Hamman M.D.   On: 10/17/2022 10:40

## 2022-11-01 NOTE — Assessment & Plan Note (Addendum)
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  Images and pathology results were reviewed and discussed with patient.  MRI showed  6.7 cm of abnormal enhancement, predominantly in the lower-inner inferior left breast, with the ultrasound-guided biopsy marking clip centrally positioned within the enhancement. Suspicious for involvement by cancer, cT3 disease.  Patient desires mastectomy. Patient has no desire of preserving fertility.  Currently on neoadjuvant chemotherapy with Carboplatin, taxol Keytruda followed by [facility] Q3 weeks x 4 Rationale and side effects were reviewed with patient.  Discussed about antiemetics instruction.  She agrees with the plan.  Proceed with cycle 1 D8 taxol. She will return next week for D15 Taxol, followed by D16-18 GCSF

## 2022-11-01 NOTE — Assessment & Plan Note (Signed)
Refer to genetic counseling. Genetic test results are pending.

## 2022-11-02 ENCOUNTER — Other Ambulatory Visit: Payer: Self-pay

## 2022-11-04 ENCOUNTER — Telehealth: Payer: Self-pay | Admitting: *Deleted

## 2022-11-04 ENCOUNTER — Encounter: Payer: Self-pay | Admitting: Oncology

## 2022-11-04 NOTE — Telephone Encounter (Signed)
Faxed FMLA to [facility] at [phone]

## 2022-11-05 NOTE — Progress Notes (Signed)
The following biosimilar Zarxio ([id number]) has been selected for use in this patient.  Ebony Hail, Pharm.D., CPP 11/05/2022@4 :04 PM

## 2022-11-07 ENCOUNTER — Encounter: Payer: Self-pay | Admitting: *Deleted

## 2022-11-07 ENCOUNTER — Other Ambulatory Visit: Payer: Self-pay

## 2022-11-07 MED FILL — Dexamethasone Sodium Phosphate Inj 100 MG/10ML: INTRAMUSCULAR | Qty: 1 | Status: AC

## 2022-11-07 NOTE — Progress Notes (Signed)
Penny Gomez called asking about what happens if she had to miss her appointments tomorrow due to the weather.  Discussed with Dr. Cathie Hoops and she was encouraged to not miss if at all possible, otherwise she will have to either miss this week if she wants to keep appointments on Fridays or if rescheduled to a different day, all subsequent appointments will have to be missed.   She will call in the morning if she needs to miss or will be late for her infusion.

## 2022-11-08 ENCOUNTER — Inpatient Hospital Stay: Payer: Medicaid Other

## 2022-11-08 ENCOUNTER — Ambulatory Visit: Payer: Medicaid Other | Admitting: Oncology

## 2022-11-08 ENCOUNTER — Encounter: Payer: Self-pay | Admitting: Oncology

## 2022-11-08 VITALS — BP 109/67 | HR 82 | Temp 96.2°F | Resp 16

## 2022-11-08 DIAGNOSIS — Z5112 Encounter for antineoplastic immunotherapy: Secondary | ICD-10-CM | POA: Diagnosis not present

## 2022-11-08 DIAGNOSIS — C50919 Malignant neoplasm of unspecified site of unspecified female breast: Secondary | ICD-10-CM

## 2022-11-08 DIAGNOSIS — Z95828 Presence of other vascular implants and grafts: Secondary | ICD-10-CM

## 2022-11-08 LAB — CBC WITH DIFFERENTIAL (CANCER CENTER ONLY)
Abs Immature Granulocytes: 0.06 10*3/uL (ref 0.00–0.07)
Basophils Absolute: 0 10*3/uL (ref 0.0–0.1)
Basophils Relative: 1 %
Eosinophils Absolute: 0 10*3/uL (ref 0.0–0.5)
Eosinophils Relative: 0 %
HCT: 31.7 % — ABNORMAL LOW (ref 36.0–46.0)
Hemoglobin: 9.2 g/dL — ABNORMAL LOW (ref 12.0–15.0)
Immature Granulocytes: 1 %
Lymphocytes Relative: 37 %
Lymphs Abs: 1.8 10*3/uL (ref 0.7–4.0)
MCH: 22.3 pg — ABNORMAL LOW (ref 26.0–34.0)
MCHC: 29 g/dL — ABNORMAL LOW (ref 30.0–36.0)
MCV: 76.8 fL — ABNORMAL LOW (ref 80.0–100.0)
Monocytes Absolute: 0.3 10*3/uL (ref 0.1–1.0)
Monocytes Relative: 6 %
Neutro Abs: 2.7 10*3/uL (ref 1.7–7.7)
Neutrophils Relative %: 55 %
Platelet Count: 289 10*3/uL (ref 150–400)
RBC: 4.13 MIL/uL (ref 3.87–5.11)
RDW: 32 % — ABNORMAL HIGH (ref 11.5–15.5)
WBC Count: 4.9 10*3/uL (ref 4.0–10.5)
nRBC: 0 % (ref 0.0–0.2)

## 2022-11-08 LAB — CMP (CANCER CENTER ONLY)
ALT: 19 U/L (ref 0–44)
AST: 20 U/L (ref 15–41)
Albumin: 3.6 g/dL (ref 3.5–5.0)
Alkaline Phosphatase: 64 U/L (ref 38–126)
Anion gap: 6 (ref 5–15)
BUN: 8 mg/dL (ref 6–20)
CO2: 23 mmol/L (ref 22–32)
Calcium: 8.6 mg/dL — ABNORMAL LOW (ref 8.9–10.3)
Chloride: 109 mmol/L (ref 98–111)
Creatinine: 0.64 mg/dL (ref 0.44–1.00)
GFR, Estimated: >60 mL/min (ref >60)
Glucose, Bld: 88 mg/dL (ref 70–99)
Potassium: 3.7 mmol/L (ref 3.5–5.1)
Sodium: 138 mmol/L (ref 135–145)
Total Bilirubin: 0.4 mg/dL (ref 0.3–1.2)
Total Protein: 6.9 g/dL (ref 6.5–8.1)

## 2022-11-08 LAB — PREGNANCY, URINE: Preg Test, Ur: NEGATIVE

## 2022-11-08 MED ORDER — SODIUM CHLORIDE 0.9 % IV SOLN
Freq: Once | INTRAVENOUS | Status: AC
  Filled 2022-11-08: qty 250

## 2022-11-08 MED ORDER — DIPHENHYDRAMINE HCL 50 MG/ML IJ SOLN
50.0000 mg | Freq: Once | INTRAMUSCULAR | Status: AC
  Administered 2022-11-08: 50 mg via INTRAVENOUS
  Filled 2022-11-08: qty 1

## 2022-11-08 MED ORDER — SODIUM CHLORIDE 0.9 % IV SOLN
80.0000 mg/m2 | Freq: Once | INTRAVENOUS | Status: AC
  Administered 2022-11-08: 126 mg via INTRAVENOUS
  Filled 2022-11-08: qty 21

## 2022-11-08 MED ORDER — SODIUM CHLORIDE 0.9% FLUSH
10.0000 mL | Freq: Once | INTRAVENOUS | Status: AC
  Administered 2022-11-08: 10 mL via INTRAVENOUS
  Filled 2022-11-08: qty 10

## 2022-11-08 MED ORDER — FAMOTIDINE IN NACL 20-0.9 MG/50ML-% IV SOLN
20.0000 mg | Freq: Once | INTRAVENOUS | Status: AC
  Administered 2022-11-08: 20 mg via INTRAVENOUS
  Filled 2022-11-08: qty 50

## 2022-11-08 MED ORDER — HEPARIN SOD (PORK) LOCK FLUSH 100 UNIT/ML IV SOLN
500.0000 [IU] | Freq: Once | INTRAVENOUS | Status: AC
  Administered 2022-11-08: 500 [IU] via INTRAVENOUS
  Filled 2022-11-08: qty 5

## 2022-11-08 MED ORDER — SODIUM CHLORIDE 0.9 % IV SOLN
10.0000 mg | Freq: Once | INTRAVENOUS | Status: AC
  Administered 2022-11-08: 10 mg via INTRAVENOUS
  Filled 2022-11-08: qty 10

## 2022-11-08 NOTE — Progress Notes (Signed)
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  Patient receiving neoadjuvant chemotherapy taxol, carboplatin, keytruda.  Also with iron deficiency anemia and receiving venofer.   Met with patient and mother in infusion.  Patient reports appetite was good this time.  No nausea or diarrhea, some constipation.  Has miralax on hand if needed.  Has been contacted by [employer] regarding boost shakes and she will receiving them likely next week.   Medications: reviewed  Labs: reviewed  Anthropometrics:   Weight 133 lb 3.2 oz on 9/20 135 lb 8/29 UBW   NUTRITION DIAGNOSIS: Inadequate oral intake stable    INTERVENTION:  [employer] will be supplying boost plus shakes BID. Discussed strategies to help with constipation.    MONITORING, EVALUATION, GOAL: weight trends, intake   NEXT VISIT: Friday, Oct 18 during infusion  Antoria Lanza B. Freida Busman, RD, LDN Registered Dietitian [phone]

## 2022-11-08 NOTE — Patient Instructions (Signed)
[facility]  Discharge Instructions: Thank you for choosing [facility] to provide your oncology and hematology care.  If you have a lab appointment with the [facility], please go directly to the [facility] and check in at the registration area.  Wear comfortable clothing and clothing appropriate for easy access to any Portacath or PICC line.   We strive to give you quality time with your provider. You may need to reschedule your appointment if you arrive late (15 or more minutes).  Arriving late affects you and other patients whose appointments are after yours.  Also, if you miss three or more appointments without notifying the office, you may be dismissed from the clinic at the provider's discretion.      For prescription refill requests, have your pharmacy contact our office and allow 72 hours for refills to be completed.    Today you received the following chemotherapy and/or immunotherapy agents- taxol      To help prevent nausea and vomiting after your treatment, we encourage you to take your nausea medication as directed.  BELOW ARE SYMPTOMS THAT SHOULD BE REPORTED IMMEDIATELY: *FEVER GREATER THAN 100.4 F (38 C) OR HIGHER *CHILLS OR SWEATING *NAUSEA AND VOMITING THAT IS NOT CONTROLLED WITH YOUR NAUSEA MEDICATION *UNUSUAL SHORTNESS OF BREATH *UNUSUAL BRUISING OR BLEEDING *URINARY PROBLEMS (pain or burning when urinating, or frequent urination) *BOWEL PROBLEMS (unusual diarrhea, constipation, pain near the anus) TENDERNESS IN MOUTH AND THROAT WITH OR WITHOUT PRESENCE OF ULCERS (sore throat, sores in mouth, or a toothache) UNUSUAL RASH, SWELLING OR PAIN  UNUSUAL VAGINAL DISCHARGE OR ITCHING   Items with * indicate a potential emergency and should be followed up as soon as possible or go to the Emergency Department if any problems should occur.  Please show the CHEMOTHERAPY ALERT CARD or IMMUNOTHERAPY ALERT CARD at check-in to the  Emergency Department and triage nurse.  Should you have questions after your visit or need to cancel or reschedule your appointment, please contact [facility]  [phone] and follow the prompts.  Office hours are 8:00 a.m. to 4:30 p.m. Monday - Friday. Please note that voicemails left after 4:00 p.m. may not be returned until the following business day.  We are closed weekends and major holidays. You have access to a nurse at all times for urgent questions. Please call the main number to the clinic [phone] and follow the prompts.  For any non-urgent questions, you may also contact your provider using MyChart. We now offer e-Visits for anyone 10 and older to request care online for non-urgent symptoms. For details visit [email or web address].   Also download the MyChart app! Go to the app store, search "MyChart", open the app, select [facility], and log in with your MyChart username and password.

## 2022-11-11 ENCOUNTER — Encounter: Payer: Self-pay | Admitting: Licensed Clinical Social Worker

## 2022-11-11 ENCOUNTER — Telehealth: Payer: Self-pay | Admitting: Licensed Clinical Social Worker

## 2022-11-11 ENCOUNTER — Inpatient Hospital Stay: Payer: Medicaid Other

## 2022-11-11 DIAGNOSIS — Z1379 Encounter for other screening for genetic and chromosomal anomalies: Secondary | ICD-10-CM | POA: Insufficient documentation

## 2022-11-11 DIAGNOSIS — Z1501 Genetic susceptibility to malignant neoplasm of breast: Secondary | ICD-10-CM | POA: Insufficient documentation

## 2022-11-11 DIAGNOSIS — C50919 Malignant neoplasm of unspecified site of unspecified female breast: Secondary | ICD-10-CM

## 2022-11-11 DIAGNOSIS — Z5112 Encounter for antineoplastic immunotherapy: Secondary | ICD-10-CM | POA: Diagnosis not present

## 2022-11-11 MED ORDER — FILGRASTIM-SNDZ 480 MCG/0.8ML IJ SOSY
480.0000 ug | PREFILLED_SYRINGE | Freq: Once | INTRAMUSCULAR | Status: AC
  Administered 2022-11-11: 480 ug via SUBCUTANEOUS
  Filled 2022-11-11: qty 0.8

## 2022-11-11 NOTE — Telephone Encounter (Signed)
I contacted Ms. Lomanto to discuss her genetic testing results. 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  Detailed clinic note to follow. Patient will see me 10/1 to review in detail.   The test report has been scanned into EPIC and is located under the Molecular Pathology section of the Results Review tab.  A portion of the result report is included below for reference.      Lacy Duverney, MS, [facility] Genetic Counselor [email or web address] Phone: [phone]

## 2022-11-12 ENCOUNTER — Encounter: Payer: Self-pay | Admitting: Oncology

## 2022-11-12 ENCOUNTER — Encounter: Payer: Self-pay | Admitting: Licensed Clinical Social Worker

## 2022-11-12 ENCOUNTER — Inpatient Hospital Stay: Payer: Medicaid Other | Attending: Oncology

## 2022-11-12 ENCOUNTER — Inpatient Hospital Stay: Payer: Medicaid Other | Admitting: Licensed Clinical Social Worker

## 2022-11-12 DIAGNOSIS — Z803 Family history of malignant neoplasm of breast: Secondary | ICD-10-CM | POA: Insufficient documentation

## 2022-11-12 DIAGNOSIS — Z5189 Encounter for other specified aftercare: Secondary | ICD-10-CM | POA: Insufficient documentation

## 2022-11-12 DIAGNOSIS — Z801 Family history of malignant neoplasm of trachea, bronchus and lung: Secondary | ICD-10-CM | POA: Insufficient documentation

## 2022-11-12 DIAGNOSIS — Z5112 Encounter for antineoplastic immunotherapy: Secondary | ICD-10-CM | POA: Diagnosis present

## 2022-11-12 DIAGNOSIS — Z1501 Genetic susceptibility to malignant neoplasm of breast: Secondary | ICD-10-CM

## 2022-11-12 DIAGNOSIS — Z5111 Encounter for antineoplastic chemotherapy: Secondary | ICD-10-CM | POA: Insufficient documentation

## 2022-11-12 DIAGNOSIS — D509 Iron deficiency anemia, unspecified: Secondary | ICD-10-CM | POA: Diagnosis not present

## 2022-11-12 DIAGNOSIS — R309 Painful micturition, unspecified: Secondary | ICD-10-CM | POA: Insufficient documentation

## 2022-11-12 DIAGNOSIS — Z800 Family history of malignant neoplasm of digestive organs: Secondary | ICD-10-CM | POA: Insufficient documentation

## 2022-11-12 DIAGNOSIS — Z1509 Genetic susceptibility to other malignant neoplasm: Secondary | ICD-10-CM | POA: Diagnosis not present

## 2022-11-12 DIAGNOSIS — C50312 Malignant neoplasm of lower-inner quadrant of left female breast: Secondary | ICD-10-CM | POA: Insufficient documentation

## 2022-11-12 DIAGNOSIS — C50919 Malignant neoplasm of unspecified site of unspecified female breast: Secondary | ICD-10-CM

## 2022-11-12 DIAGNOSIS — Z1379 Encounter for other screening for genetic and chromosomal anomalies: Secondary | ICD-10-CM

## 2022-11-12 MED ORDER — FILGRASTIM-SNDZ 480 MCG/0.8ML IJ SOSY
480.0000 ug | PREFILLED_SYRINGE | Freq: Once | INTRAMUSCULAR | Status: AC
  Administered 2022-11-12: 480 ug via SUBCUTANEOUS
  Filled 2022-11-12: qty 0.8

## 2022-11-12 NOTE — Progress Notes (Signed)
Genetic Test Results - BRCA1+  HPI:   Penny Gomez was previously seen in the [facility] due to a personal and family history of cancer and concerns regarding a hereditary predisposition to cancer. Please refer to our prior cancer genetics clinic note for more information regarding our discussion, assessment and recommendations, at the time. Penny Gomez recent genetic test results were disclosed to her, as were recommendations warranted by these results. These results and recommendations are discussed in more detail below.  CANCER HISTORY:  Oncology History  Invasive carcinoma of breast (HCC)  07/24/2022 Mammogram   She noticed left breast mass for 1 month.  Bilateral diagnostic mammogram showed Suspicious palpable left breast mass 8 o'clock position. Cortically thickened left axillary lymph node   10/10/2022 Initial Diagnosis   Invasive carcinoma of breast (HCC)  10/02/2022  Left breast mass biopsy and left axillary lymph node biopsy.  Diagnosis 1. Breast, left, needle core biopsy, 8 o'clock 6 cmfn, heart clip - INVASIVE MAMMARY CARCINOMA, NO SPECIAL TYPE. - TUBULE FORMATION: SCORE 3 - NUCLEAR PLEOMORPHISM: SCORE 3 - MITOTIC COUNT: SCORE 3 - TOTAL SCORE: 9 - OVERALL GRADE: 3 - LYMPHOVASCULAR INVASION: NOT IDENTIFIED - CANCER LENGTH: 11 MM - CALCIFICATIONS: NOT IDENTIFIED - DUCTAL CARCINOMA IN SITU: PRESENT, HIGH-GRADE - ER-, PR- HER2 - [IHC 1+] 2. Lymph node, needle/core biopsy, left axillary, hydromark - LYMPH NODE WITH REACTIVE CHANGES; NEGATIVE FOR MALIGNANCY.  Menarche at age of 13 First live birth at age of 58 OCP use: no History of hysterectomy: no Menopausal status: premenopausal History of HRT use: no History of chest radiation: no Number of previous breast biopsies:  right breast biopsy 05/20/2019 negative for malignancy Strong family history of cancer mother breast cancer diagnosed in 45s, maternal aunts x 2 breast cancer, maternal cousins breast  cancer x 2, maternal grandmother pancreatic cancer.    10/10/2022 Cancer Staging   Staging form: Breast, AJCC 8th Edition - Clinical stage from 10/10/2022: Stage IIIB (cT3, cN0, cM0, G3, ER-, PR-, HER2-) - Signed by [name], MD on 10/18/2022 Stage prefix: Initial diagnosis Nuclear grade: G3 Histologic grading system: 3 grade system   10/17/2022 Imaging   Bilateral MRI breasts w wo contrast  1. In total, there is approximately 6.7 cm of abnormal enhancement, predominantly in the lower-inner inferior left breast, with the ultrasound-guided biopsy marking clip centrally positioned within the enhancement. There are scattered small enhancing masses in the lower inner and lower outer anterior left (included in the measurement above).   2.  No evidence of right breast malignancy.   3. The axillary lymph nodes are not included within the field of view of this MRI for adequate assessment of lymphadenopathy.    10/17/2022 Procedure   S/p medi port placement.    10/25/2022 - 10/25/2022 Chemotherapy   Patient is on Treatment Plan : BREAST ADJUVANT DOSE DENSE AC q14d / PACLitaxel q7d     10/25/2022 -  Chemotherapy   Patient is on Treatment Plan : BREAST Pembrolizumab (200) D1 + Carboplatin (5) D1 + Paclitaxel (80) D1,8,15 q21d X 4 cycles / Pembrolizumab (200) D1 + AC D1 q21d x 4 cycles      Genetic Testing   Single pathogenic variant in BRCA1 called c.190T>G identified on the [facility] Multi-Cancer+RNA panel. The remainder of testing was normal. The report date is 11/11/2022.  The Multi-Cancer + RNA Panel offered by [facility] includes sequencing and/or deletion/duplication analysis of the following 70 genes:  AIP*, ALK, APC*, ATM*, AXIN2*, BAP1*, BARD1*,  BLM*, BMPR1A*, BRCA1*, BRCA2*, BRIP1*, CDC73*, CDH1*, CDK4, CDKN1B*, CDKN2A, CHEK2*, CTNNA1*, DICER1*, EPCAM, EGFR, FH*, FLCN*, GREM1, HOXB13, KIT, LZTR1, MAX*, MBD4, MEN1*, MET, MITF, MLH1*, MSH2*, MSH3*, MSH6*, MUTYH*, NF1*, NF2*, NTHL1*, PALB2*, PDGFRA, PMS2*,  POLD1*, POLE*, POT1*, PRKAR1A*, PTCH1*, PTEN*, RAD51C*, RAD51D*, RB1*, RET, SDHA*, SDHAF2*, SDHB*, SDHC*, SDHD*, SMAD4*, SMARCA4*, SMARCB1*, SMARCE1*, STK11*, SUFU*, TMEM127*, TP53*, TSC1*, TSC2*, VHL*. RNA analysis is performed for * genes.     FAMILY HISTORY:  We obtained a detailed, 4-generation family history.  Significant diagnoses are listed below: Family History  Problem Relation Age of Onset   Breast cancer Mother 74       no GT   Breast cancer Maternal Aunt 33   Breast cancer Maternal Aunt    Lung cancer Maternal Uncle    Lung cancer Maternal Uncle    Bone cancer Paternal Aunt    Pancreatic cancer Maternal Grandmother 48   Lung cancer Maternal Grandfather 30   Breast cancer Cousin 40   Breast cancer Other 33       gene pos?   Penny Gomez has 1 son, 52 and 1 daughter 25. She has 1 brother, 68, no cancerse.   Penny Gomez mother had breast cancer at 105 and is living at 49, she has not had genetic testing. Patient has 2 maternal aunts who also had breast cancer. One had it in her 30s and then had lung cancer in her 93s. The other had it at 33 and passed at 42. Two maternal uncles had lung cancer and history of smoking. A maternal cousin had breast cancer in her 70s, and her daughter had breast cancer at 85 and is living at 68 and reportedly had positive genetic testing, unknown gene. Patient will try to get copy of report. Maternal grandmother had pancreatic cancer at 28. Her sister's son also had pancreatic cancer. Maternal great grandmother had stomach cancer. Maternal grandfather died of lung cancer at 40.   Penny Gomez's father died at 65. A paternal aunt had bone cancer. No other known cancers on this side of the family.   Penny Gomez is unaware of previous family history of genetic testing for hereditary cancer risks. There is no reported Ashkenazi Jewish ancestry. There is no known consanguinity.      GENETIC TEST RESULTS:  Penny Gomez tested positive for a single pathogenic  variant (harmful genetic change) in the BRCA1 gene. Specifically, this variant is c.190T>G (p.Cys64Gly). The remainder of testing was normal.  The Multi-Cancer + RNA Panel offered by [facility] includes sequencing and/or deletion/duplication analysis of the following 70 genes:  AIP*, ALK, APC*, ATM*, AXIN2*, BAP1*, BARD1*, BLM*, BMPR1A*, BRCA1*, BRCA2*, BRIP1*, CDC73*, CDH1*, CDK4, CDKN1B*, CDKN2A, CHEK2*, CTNNA1*, DICER1*, EPCAM, EGFR, FH*, FLCN*, GREM1, HOXB13, KIT, LZTR1, MAX*, MBD4, MEN1*, MET, MITF, MLH1*, MSH2*, MSH3*, MSH6*, MUTYH*, NF1*, NF2*, NTHL1*, PALB2*, PDGFRA, PMS2*, POLD1*, POLE*, POT1*, PRKAR1A*, PTCH1*, PTEN*, RAD51C*, RAD51D*, RB1*, RET, SDHA*, SDHAF2*, SDHB*, SDHC*, SDHD*, SMAD4*, SMARCA4*, SMARCB1*, SMARCE1*, STK11*, SUFU*, TMEM127*, TP53*, TSC1*, TSC2*, VHL*. RNA analysis is performed for * genes.  The test report has been scanned into EPIC and is located under the Molecular Pathology section of the Results Review tab.  A portion of the result report is included below for reference. Genetic testing reported out on 11/11/2022.     Clinical Information: Hereditary breast and ovarian cancer (HBOC) syndrome is characterized by an increased lifetime risk for, generally, adult-onset cancers including, breast, contralateral breast, female breast, ovarian, prostate, melanoma and pancreatic.  The cancers associated with  BRCA1 are:  Female breast cancer, up to a 78% risk Up to 73% of the breast cancers diagnosed in women with BRCA1 mutations are triple negative breast cancer In women with a history of breast cancer, the cumulative risk for contralateral breast cancer 20 years after breast cancer diagnosis is 30-40%. Female breast cancer, up to a 2% risk Ovarian cancer, 39-58% risk Pancreatic cancer, 3-5% risk Prostate cancer, 7-26% risk Limited data suggest there may be a slightly increased risk of serous uterine cancer    Management Recommendations:  Breast Screening/Risk  Reduction:  Women: Breast awareness starting at age 39 Clinical breast examination every 6-12 months starting at age 75  Breast cancer screening: Age 67-29 years, annual breast MRI with and without contrast (or mammogram, if MRI is unavailable), although the age to initiate screening may be individualized based on family history Age 58-75 years, annual mammogram and breast MRI with and without contrast Age >75 years, management should be considered on an individual basis For women with a BRCA1 pathogenic or likely pathogenic variant who are treated for breast cancer and have not had a bilateral mastectomy, screening with annual mammogram and breast MRI should continue as described above. The option of prophylactic bilateral risk-reducing mastectomy (RRM), removal of the breast tissue before cancer develops, is the best option for significantly decreasing the risk of developing breast cancer. Studies have shown mastectomies reduce the risk of breast cancer by 90-95% in women with a BRCA1 mutation.   Males: Breast self-exam training and education starting at age 13 years Annual clinical breast exam starting at age 59 years  Consider annual mammogram starting at age 56 or 10 years before the earliest known female breast cancer in the family (whichever comes first).   Gynecological Cancer Screening/Risk Reduction: It is recommended that women with a BRCA1 mutation have a risk-reducing salpingo oophorectomy (RRSO), removal of the ovaries and fallopian tubes, between the ages of 16-40 or once childbearing is completed. Having a RRSO is estimated to reduce the risk of ovarian cancer by up to 96%. There is still a small risk of developing an "ovarian-like" cancer in the lining of the abdomen, called the peritoneum. Another benefit to having the ovaries removed is the risk reduction for breast cancer. If the ovaries are removed before menopause, the risk of developing breast cancer is reduced. Women  undergoing a RRSO should be aware of the potential risks and benefits of concurrent hysterectomy. Hormone replacement therapy could be considered based on the physician's discretion. Ovarian cancer screening is an option for women who chose not to have a RRSO or who have not yet completed their family. Current screening methods for ovarian cancer are neither sensitive nor specific, meaning that often early stage ovarian cancer cannot be diagnosed through this screening.  Screening can also be falsely positive with no cancer present. For this reason, RRSO is recommended over screening. If ovarian cancer screening is recommended by a physician, it could include: CA-125 blood tests Transvaginal ultrasounds Clinical pelvic exams   Prostate Cancer Screening: Consider annual digital rectal exam (DRE) at age 35 Consider annual PSA blood test at age 18  Pancreatic Cancer Screening/Risk Reduction: Consider pancreatic screening at age 44 (or 43 years younger than the earliest exocrine pancreatic cancer diagnosis in the family, whichever is earlier) NCCN recommends screening be performed in experienced, high-volume centers with discussion about potential limitations Consider screening using annual contrast-enhanced MRI MRCP and/or EUS with consideration of shorter screening intervals based on clinical judgement  Additional Considerations: Individuals at risk for developing breast and ovarian cancer may benefit from the use of medication to reduce their risk for cancer. These medications are referred to as chemoprevention. For example, oral contraceptive use has been shown to reduce the risk of ovarian cancer by approximately 60% in BRCA1 mutation carriers if taken for at least 5 years. This risk reduction remains even after discontinuation of oral contraceptives. Recent studies have suggested PARP inhibitors may be a beneficial chemotherapeutic agent for a subset of patients with BRCA1-associated breast,  ovarian, prostate, and pancreatic cancers. Clinical trials are currently in process to determine if and how these agents can be useful in the treatment of BRCA1 cancer patients. Patients of reproductive age should be made aware of options for prenatal diagnosis and assisted reproduction including pre-implantation genetic diagnosis. Individuals with a single pathogenic BRCA1 variant are carriers of Fanconi anemia. Fanconi anemia is characterized by developmental delay apparent from infancy, short stature, microcephaly, and coarse dysmorphic features. For there to be a risk of Fanconi anemia in offspring, both the patient and their partner would each have to carry a pathogenic variant in BRCA1. In this case, the risk of having an affected child is 25%.   Guidelines are from NCCN v.1.2025 Genetic/Familial High Risk Assessment: Breast, Ovarian and Pancreatic. This is based on our current understanding of BRCA1 and may change in the future.   Implications for Family Members: Hereditary predisposition to cancer due to pathogenic variants in the BRCA1 gene has autosomal dominant inheritance. This means that an individual with a pathogenic variant has a 50% chance of passing the condition on to his/her offspring. Identification of a pathogenic variant allows for the recognition of at-risk relatives who can pursue testing for the familial variant.  Family members are encouraged to consider genetic testing for this familial pathogenic variant. As there are generally no childhood cancer risks associated with a single pathogenic variant in the BRCA1 gene, individuals in the family are not recommended to have testing until they reach at least 36 years of age. They may contact our office at [phone] or more information or to schedule an appointment.  Complimentary testing for the familial variant is available for 150 days after the genetic testing report date.  Family members who live outside of the area are  encouraged to find a genetic counselor in their area by visiting: [email or web address].  Resources: FORCE (Facing Our Risk of Cancer Empowered) is a resource for those with a hereditary predisposition to develop cancer.  FORCE provides information about risk reduction, advocacy, legislation, and clinical trials.  Additionally, FORCE provides a platform for collaboration and support; which includes: peer navigation, message boards, local support groups, a toll-free helpline, research registry and recruitment, advocate training, published medical research, webinars, brochures, mastectomy photos, and more.  For more information, visit www.facingourrisk.org  PLAN: 1. These results will be made available to  her referring provider, Dr. Cathie Hoops. She would like her Dr. Cathie Hoops and her PCP, Shane Crutch, PA,  to follow her long-term for this indication and coordinate screening/prophylactic surgeries.    2. Penny Gomez plans to discuss these results with her family and will reach out to [place] if we can be of any assistance in coordinating genetic testing for any of her relatives.   We encouraged Penny Gomez to remain in contact with [place] on an annual basis so we can update her personal and family histories, and let her know of advances in cancer genetics that may benefit the family. Our contact  number was provided. Penny Gomez's questions were answered to her satisfaction today, and she knows she is welcome to call anytime with additional questions.   Lacy Duverney, MS, [facility] Genetic Counselor [email or web address] Phone: [phone]

## 2022-11-13 ENCOUNTER — Inpatient Hospital Stay: Payer: Medicaid Other

## 2022-11-13 ENCOUNTER — Telehealth: Payer: Self-pay

## 2022-11-13 ENCOUNTER — Other Ambulatory Visit: Payer: Self-pay

## 2022-11-13 ENCOUNTER — Encounter: Payer: Self-pay | Admitting: Oncology

## 2022-11-13 DIAGNOSIS — Z5112 Encounter for antineoplastic immunotherapy: Secondary | ICD-10-CM | POA: Diagnosis not present

## 2022-11-13 DIAGNOSIS — R309 Painful micturition, unspecified: Secondary | ICD-10-CM

## 2022-11-13 DIAGNOSIS — C50919 Malignant neoplasm of unspecified site of unspecified female breast: Secondary | ICD-10-CM

## 2022-11-13 LAB — URINALYSIS, COMPLETE (UACMP) WITH MICROSCOPIC
Bilirubin Urine: NEGATIVE
Glucose, UA: NEGATIVE mg/dL
Ketones, ur: NEGATIVE mg/dL
Nitrite: NEGATIVE
Protein, ur: NEGATIVE mg/dL
RBC / HPF: >50 RBC/hpf (ref 0–5)
Specific Gravity, Urine: 1.023 (ref 1.005–1.030)
pH: 8 (ref 5.0–8.0)

## 2022-11-13 MED ORDER — FILGRASTIM-SNDZ 480 MCG/0.8ML IJ SOSY
480.0000 ug | PREFILLED_SYRINGE | Freq: Once | INTRAMUSCULAR | Status: AC
  Administered 2022-11-13: 480 ug via SUBCUTANEOUS
  Filled 2022-11-13: qty 0.8

## 2022-11-13 NOTE — Telephone Encounter (Signed)
Spoke to pt and informed her that UA showed only trace leukocytes, but we will wait for UA.

## 2022-11-14 ENCOUNTER — Other Ambulatory Visit: Payer: Self-pay

## 2022-11-14 ENCOUNTER — Other Ambulatory Visit: Payer: Self-pay | Admitting: Oncology

## 2022-11-14 LAB — URINE CULTURE

## 2022-11-14 MED FILL — Dexamethasone Sodium Phosphate Inj 100 MG/10ML: INTRAMUSCULAR | Qty: 1 | Status: AC

## 2022-11-14 MED FILL — Fosaprepitant Dimeglumine For IV Infusion 150 MG (Base Eq): INTRAVENOUS | Qty: 5 | Status: AC

## 2022-11-15 ENCOUNTER — Other Ambulatory Visit: Payer: Self-pay

## 2022-11-15 ENCOUNTER — Other Ambulatory Visit: Payer: Self-pay | Admitting: *Deleted

## 2022-11-15 ENCOUNTER — Telehealth: Payer: Self-pay

## 2022-11-15 ENCOUNTER — Encounter: Payer: Self-pay | Admitting: Oncology

## 2022-11-15 ENCOUNTER — Inpatient Hospital Stay: Payer: Medicaid Other

## 2022-11-15 ENCOUNTER — Inpatient Hospital Stay (HOSPITAL_BASED_OUTPATIENT_CLINIC_OR_DEPARTMENT_OTHER): Payer: Medicaid Other | Attending: Oncology | Admitting: Oncology

## 2022-11-15 VITALS — BP 112/67 | HR 89 | Resp 18

## 2022-11-15 VITALS — BP 119/77 | HR 82 | Temp 96.4°F | Resp 18 | Wt 139.1 lb

## 2022-11-15 DIAGNOSIS — R309 Painful micturition, unspecified: Secondary | ICD-10-CM

## 2022-11-15 DIAGNOSIS — D500 Iron deficiency anemia secondary to blood loss (chronic): Secondary | ICD-10-CM

## 2022-11-15 DIAGNOSIS — C50919 Malignant neoplasm of unspecified site of unspecified female breast: Secondary | ICD-10-CM

## 2022-11-15 DIAGNOSIS — Z5112 Encounter for antineoplastic immunotherapy: Secondary | ICD-10-CM | POA: Diagnosis not present

## 2022-11-15 DIAGNOSIS — Z1501 Genetic susceptibility to malignant neoplasm of breast: Secondary | ICD-10-CM

## 2022-11-15 DIAGNOSIS — Z1509 Genetic susceptibility to other malignant neoplasm: Secondary | ICD-10-CM

## 2022-11-15 DIAGNOSIS — Z5111 Encounter for antineoplastic chemotherapy: Secondary | ICD-10-CM

## 2022-11-15 LAB — PREGNANCY, URINE: Preg Test, Ur: NEGATIVE

## 2022-11-15 LAB — CMP (CANCER CENTER ONLY)
ALT: 30 U/L (ref 0–44)
AST: 29 U/L (ref 15–41)
Albumin: 3.6 g/dL (ref 3.5–5.0)
Alkaline Phosphatase: 108 U/L (ref 38–126)
Anion gap: 4 — ABNORMAL LOW (ref 5–15)
BUN: 12 mg/dL (ref 6–20)
CO2: 24 mmol/L (ref 22–32)
Calcium: 8.5 mg/dL — ABNORMAL LOW (ref 8.9–10.3)
Chloride: 109 mmol/L (ref 98–111)
Creatinine: 0.61 mg/dL (ref 0.44–1.00)
GFR, Estimated: >60 mL/min (ref >60)
Glucose, Bld: 99 mg/dL (ref 70–99)
Potassium: 3.8 mmol/L (ref 3.5–5.1)
Sodium: 137 mmol/L (ref 135–145)
Total Bilirubin: 0.2 mg/dL — ABNORMAL LOW (ref 0.3–1.2)
Total Protein: 6.5 g/dL (ref 6.5–8.1)

## 2022-11-15 LAB — CBC WITH DIFFERENTIAL (CANCER CENTER ONLY)
Abs Immature Granulocytes: 3.2 10*3/uL — ABNORMAL HIGH (ref 0.00–0.07)
Basophils Absolute: 0 10*3/uL (ref 0.0–0.1)
Basophils Relative: 0 %
Eosinophils Absolute: 0 10*3/uL (ref 0.0–0.5)
Eosinophils Relative: 0 %
HCT: 32 % — ABNORMAL LOW (ref 36.0–46.0)
Hemoglobin: 9.6 g/dL — ABNORMAL LOW (ref 12.0–15.0)
Immature Granulocytes: 21 %
Lymphocytes Relative: 15 %
Lymphs Abs: 2.3 10*3/uL (ref 0.7–4.0)
MCH: 24.1 pg — ABNORMAL LOW (ref 26.0–34.0)
MCHC: 30 g/dL (ref 30.0–36.0)
MCV: 80.4 fL (ref 80.0–100.0)
Monocytes Absolute: 1.5 10*3/uL — ABNORMAL HIGH (ref 0.1–1.0)
Monocytes Relative: 10 %
Neutro Abs: 8.6 10*3/uL — ABNORMAL HIGH (ref 1.7–7.7)
Neutrophils Relative %: 54 %
Platelet Count: 266 10*3/uL (ref 150–400)
RBC: 3.98 MIL/uL (ref 3.87–5.11)
RDW: 34.8 % — ABNORMAL HIGH (ref 11.5–15.5)
Smear Review: NORMAL
WBC Count: 15.6 10*3/uL — ABNORMAL HIGH (ref 4.0–10.5)
nRBC: 0.4 % — ABNORMAL HIGH (ref 0.0–0.2)

## 2022-11-15 MED ORDER — SODIUM CHLORIDE 0.9 % IV SOLN
80.0000 mg/m2 | Freq: Once | INTRAVENOUS | Status: AC
  Administered 2022-11-15: 126 mg via INTRAVENOUS
  Filled 2022-11-15: qty 21

## 2022-11-15 MED ORDER — SODIUM CHLORIDE 0.9 % IV SOLN
10.0000 mg | Freq: Once | INTRAVENOUS | Status: AC
  Administered 2022-11-15: 10 mg via INTRAVENOUS
  Filled 2022-11-15: qty 10

## 2022-11-15 MED ORDER — SODIUM CHLORIDE 0.9 % IV SOLN
200.0000 mg | Freq: Once | INTRAVENOUS | Status: AC
  Administered 2022-11-15: 200 mg via INTRAVENOUS
  Filled 2022-11-15: qty 8

## 2022-11-15 MED ORDER — FAMOTIDINE IN NACL 20-0.9 MG/50ML-% IV SOLN
20.0000 mg | Freq: Once | INTRAVENOUS | Status: AC
  Administered 2022-11-15: 20 mg via INTRAVENOUS
  Filled 2022-11-15: qty 50

## 2022-11-15 MED ORDER — SODIUM CHLORIDE 0.9 % IV SOLN
596.0000 mg | Freq: Once | INTRAVENOUS | Status: AC
  Administered 2022-11-15: 600 mg via INTRAVENOUS
  Filled 2022-11-15: qty 60

## 2022-11-15 MED ORDER — OYSTER SHELL CALCIUM/D3 500-5 MG-MCG PO TABS: 2.0000 | ORAL_TABLET | Freq: Every day | ORAL | 11 refills | Status: DC

## 2022-11-15 MED ORDER — PALONOSETRON HCL INJECTION 0.25 MG/5ML
0.2500 mg | Freq: Once | INTRAVENOUS | Status: AC
  Administered 2022-11-15: 0.25 mg via INTRAVENOUS
  Filled 2022-11-15: qty 5

## 2022-11-15 MED ORDER — SODIUM CHLORIDE 0.9 % IV SOLN
Freq: Once | INTRAVENOUS | Status: AC
  Filled 2022-11-15: qty 250

## 2022-11-15 MED ORDER — DIPHENHYDRAMINE HCL 50 MG/ML IJ SOLN
50.0000 mg | Freq: Once | INTRAMUSCULAR | Status: AC
  Administered 2022-11-15: 50 mg via INTRAVENOUS
  Filled 2022-11-15: qty 1

## 2022-11-15 MED ORDER — HEPARIN SOD (PORK) LOCK FLUSH 100 UNIT/ML IV SOLN
500.0000 [IU] | Freq: Once | INTRAVENOUS | Status: AC | PRN
  Administered 2022-11-15: 500 [IU]
  Filled 2022-11-15: qty 5

## 2022-11-15 MED ORDER — SODIUM CHLORIDE 0.9% FLUSH
10.0000 mL | INTRAVENOUS | Status: DC | PRN
  Administered 2022-11-15: 10 mL
  Filled 2022-11-15: qty 10

## 2022-11-15 MED ORDER — CALCIUM 600 +D HIGH POTENCY 600-10 MG-MCG PO TABS: 1.0000 | ORAL_TABLET | Freq: Two times a day (BID) | ORAL | 3 refills | Status: DC

## 2022-11-15 MED ORDER — SENNA 8.6 MG PO TABS: 2.0000 | ORAL_TABLET | Freq: Every day | ORAL | 2 refills | Status: AC

## 2022-11-15 MED ORDER — SODIUM CHLORIDE 0.9 % IV SOLN
150.0000 mg | Freq: Once | INTRAVENOUS | Status: AC
  Administered 2022-11-15: 150 mg via INTRAVENOUS
  Filled 2022-11-15: qty 150

## 2022-11-15 NOTE — Assessment & Plan Note (Signed)
Chemotherapy plan as listed above 

## 2022-11-15 NOTE — Assessment & Plan Note (Signed)
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  Images and pathology results were reviewed and discussed with patient.  MRI showed  6.7 cm of abnormal enhancement, predominantly in the lower-inner inferior left breast, with the ultrasound-guided biopsy marking clip centrally positioned within the enhancement. Suspicious for involvement by cancer, cT3 disease.  Patient desires mastectomy. Patient has no desire of preserving fertility.  Currently on neoadjuvant chemotherapy with Carboplatin, taxol Keytruda followed by [facility] Q3 weeks x 4 Rationale and side effects were reviewed with patient.  Discussed about antiemetics instruction.  She agrees with the plan.  Proceed with cycle 2 Carboplatin, taxol Keytruda. She will return next week for D8, D15 Taxol, followed by D16-18 GCSF

## 2022-11-15 NOTE — Telephone Encounter (Signed)
Patient has recollected this morning and urine sample has been sent to lab.

## 2022-11-15 NOTE — Telephone Encounter (Signed)
Pharmacy asking for new prescription fr calcium to be sent in 600/10 strength as they do not carry 500/5, Also asking for quantity of 60 for 30 day supply

## 2022-11-15 NOTE — Progress Notes (Signed)
Hematology/Oncology Consult Note [phone] [phone]    CHIEF COMPLAINTS/PURPOSE OF CONSULTATION:  Left triple negative breast cancer.   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  Images and pathology results were reviewed and discussed with patient.  MRI showed  6.7 cm of abnormal enhancement, predominantly in the lower-inner inferior left breast, with the ultrasound-guided biopsy marking clip centrally positioned within the enhancement. Suspicious for involvement by cancer, cT3 disease.  Patient desires mastectomy. Patient has no desire of preserving fertility.  Currently on neoadjuvant chemotherapy with Carboplatin, taxol Keytruda followed by [facility] Q3 weeks x 4 Rationale and side effects were reviewed with patient.  Discussed about antiemetics instruction.  She agrees with the plan.  Proceed with cycle 2 Carboplatin, taxol Keytruda. She will return next week for D8, D15 Taxol, followed by D16-18 GCSF    BRCA1 gene mutation positive Once she finished treatment of breast cancer, recommend risk-reducing salpingo oophorectomy  pancreatic screening at age 82 (or 65 years younger than the earliest exocrine pancreatic cancer diagnosis in the family, whichever is earlier)  Family members are encouraged to consider genetic testing for this familial pathogenic variant.   IDA (iron deficiency anemia) Labs are reviewed and discussed with patient. Lab Results  Component Value Date   HGB 9.6 (L) 11/15/2022   TIBC 511 (H) 10/11/2022   IRONPCTSAT 3 (L) 10/11/2022   FERRITIN 2 (L) 10/11/2022    Recommend one dose of venofer next week.   Encounter for antineoplastic chemotherapy Chemotherapy plan as  listed above.   No orders of the defined types were placed in this encounter.  Follow-up  1 week lab  chemotherapy. 2 weeks lab MD chemotherapy All questions were answered. The patient knows to call the clinic with any problems, questions or concerns.  [name], MD, PhD [facility] Hematology Oncology 11/15/2022    HISTORY OF PRESENTING ILLNESS:  Penny Gomez 37 y.o. female presents to establish care for left triple negative breast cancer I have reviewed her chart and materials related to her cancer extensively and collaborated history with the patient. Summary of oncologic history is as follows: Oncology History  Invasive carcinoma of breast (HCC)  07/24/2022 Mammogram   She noticed left breast mass for 1 month.  Bilateral diagnostic mammogram showed Suspicious palpable left breast mass 8 o'clock position. Cortically thickened left axillary lymph node   10/10/2022 Initial Diagnosis   Invasive carcinoma of breast (HCC)  10/02/2022  Left breast mass biopsy and left axillary lymph node biopsy.  Diagnosis 1. Breast, left, needle core biopsy, 8 o'clock 6 cmfn, heart clip - INVASIVE MAMMARY CARCINOMA, NO SPECIAL TYPE. - TUBULE FORMATION: SCORE 3 - NUCLEAR PLEOMORPHISM: SCORE 3 - MITOTIC COUNT: SCORE 3 - TOTAL SCORE: 9 - OVERALL GRADE: 3 - LYMPHOVASCULAR INVASION: NOT IDENTIFIED - CANCER LENGTH: 11 MM - CALCIFICATIONS: NOT IDENTIFIED - DUCTAL CARCINOMA IN SITU: PRESENT, HIGH-GRADE - ER-, PR- HER2 - [IHC 1+] 2. Lymph node, needle/core biopsy, left axillary, hydromark - LYMPH NODE WITH REACTIVE CHANGES; NEGATIVE FOR MALIGNANCY.  Menarche at age of 43 First live birth at age of 6 OCP use: no History of hysterectomy: no Menopausal status: premenopausal History of HRT use: no History of chest radiation: no Number of  previous breast biopsies:  right breast biopsy 05/20/2019 negative for malignancy Strong family history of cancer mother breast cancer diagnosed in 100s, maternal  aunts x 2 breast cancer, maternal cousins breast cancer x 2, maternal grandmother pancreatic cancer.    10/10/2022 Cancer Staging   Staging form: Breast, AJCC 8th Edition - Clinical stage from 10/10/2022: Stage IIIB (cT3, cN0, cM0, G3, ER-, PR-, HER2-) - Signed by [name], MD on 10/18/2022 Stage prefix: Initial diagnosis Nuclear grade: G3 Histologic grading system: 3 grade system   10/17/2022 Imaging   Bilateral MRI breasts w wo contrast  1. In total, there is approximately 6.7 cm of abnormal enhancement, predominantly in the lower-inner inferior left breast, with the ultrasound-guided biopsy marking clip centrally positioned within the enhancement. There are scattered small enhancing masses in the lower inner and lower outer anterior left (included in the measurement above).   2.  No evidence of right breast malignancy.   3. The axillary lymph nodes are not included within the field of view of this MRI for adequate assessment of lymphadenopathy.    10/17/2022 Procedure   S/p medi port placement.    10/25/2022 - 10/25/2022 Chemotherapy   Patient is on Treatment Plan : BREAST ADJUVANT DOSE DENSE AC q14d / PACLitaxel q7d     10/25/2022 -  Chemotherapy   Patient is on Treatment Plan : BREAST Pembrolizumab (200) D1 + Carboplatin (5) D1 + Paclitaxel (80) D1,8,15 q21d X 4 cycles / Pembrolizumab (200) D1 + AC D1 q21d x 4 cycles      Genetic Testing   Single pathogenic variant in BRCA1 called c.190T>G identified on the [facility] Multi-Cancer+RNA panel. The remainder of testing was normal. The report date is 11/11/2022.  The Multi-Cancer + RNA Panel offered by [facility] includes sequencing and/or deletion/duplication analysis of the following 70 genes:  AIP*, ALK, APC*, ATM*, AXIN2*, BAP1*, BARD1*, BLM*, BMPR1A*, BRCA1*, BRCA2*, BRIP1*, CDC73*, CDH1*, CDK4, CDKN1B*, CDKN2A, CHEK2*, CTNNA1*, DICER1*, EPCAM, EGFR, FH*, FLCN*, GREM1, HOXB13, KIT, LZTR1, MAX*, MBD4, MEN1*, MET, MITF, MLH1*, MSH2*, MSH3*, MSH6*,  MUTYH*, NF1*, NF2*, NTHL1*, PALB2*, PDGFRA, PMS2*, POLD1*, POLE*, POT1*, PRKAR1A*, PTCH1*, PTEN*, RAD51C*, RAD51D*, RB1*, RET, SDHA*, SDHAF2*, SDHB*, SDHC*, SDHD*, SMAD4*, SMARCA4*, SMARCB1*, SMARCE1*, STK11*, SUFU*, TMEM127*, TP53*, TSC1*, TSC2*, VHL*. RNA analysis is performed for * genes.    Today she was accompanied by her mother.  She tolerates chemotherapy. She denies nausea vomiting diarrhea. Denies tingling numbness.  Menstrual period is heavy.  Dysphagia symptoms have resolved. UA negative nitrate. Urine culture likely is contaminated. Repeat culture is pending today    MEDICAL HISTORY:  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    SURGICAL HISTORY: Past Surgical History:  Procedure Laterality Date   BREAST BIOPSY Right 05/20/2019   Affirm Bx- X- clip, neg   BREAST BIOPSY Left 10/02/2022   [place] Bx, path pending   BREAST BIOPSY Left 10/02/2022   [place] Bx Node- path pending   BREAST BIOPSY Left 10/02/2022   [place] LT BREAST BX W LOC DEV 1ST LESION IMG BX SPEC US GUIDE 10/02/2022 [facility]   CESAREAN SECTION     LAPAROSCOPIC BILATERAL SALPINGECTOMY Bilateral 02/09/2022   Procedure: LAPAROSCOPIC BILATERAL SALPINGECTOMY;  Surgeon: Hildred Laser, MD;  Location: [facility];  Service: Gynecology;  Laterality: Bilateral;   PORTACATH PLACEMENT Right  10/17/2022   Procedure: INSERTION PORT-A-CATH;  Surgeon: Henrene Dodge, MD;  Location: [facility] OR[facility];  Service: General;  Laterality: Right;   TUBAL LIGATION      SOCIAL HISTORY: Social History   Socioeconomic History   Marital status: Single    Spouse name: Not on file   Number of children: 2   Years of education: Not on file   Highest education level: High school graduate  Occupational History   Not on file  Tobacco Use   Smoking status: Never   Smokeless tobacco: Never   Vaping Use   Vaping status: Never Used  Substance and Sexual Activity   Alcohol use: Yes    Comment: occ   Drug use: No   Sexual activity: Yes    Birth control/protection: Surgical  Other Topics Concern   Not on file  Social History Narrative   Not on file   Social Determinants of Health   Financial Resource Strain: Low Risk  (10/10/2022)   Overall Financial Resource Strain (CARDIA)    Difficulty of Paying Living Expenses: Not very hard  Food Insecurity: No Food Insecurity (10/10/2022)   Hunger Vital Sign    Worried About Running Out of Food in the Last Year: Never true    Ran Out of Food in the Last Year: Never true  Transportation Needs: No Transportation Needs (09/23/2022)   PRAPARE - Administrator, Civil Service (Medical): No    Lack of Transportation (Non-Medical): No  Physical Activity: Not on file  Stress: No Stress Concern Present (10/10/2022)   [employer] of Occupational Health - Occupational Stress Questionnaire    Feeling of Stress : Only a little  Social Connections: Not on file  Intimate Partner Violence: Not on file    FAMILY HISTORY: Family History  Problem Relation Age of Onset   Breast cancer Mother 36       no GT   Breast cancer Maternal Aunt 33   Breast cancer Maternal Aunt    Lung cancer Maternal Uncle    Lung cancer Maternal Uncle    Bone cancer Paternal Aunt    Pancreatic cancer Maternal Grandmother 60   Lung cancer Maternal Grandfather 48   Breast cancer Cousin 40   Breast cancer Other 33       gene pos?    ALLERGIES:  has No Known Allergies.  MEDICATIONS:  Current Outpatient Medications  Medication Sig Dispense Refill   calcium-vitamin D (OSCAL WITH D) 500-5 MG-MCG tablet Take 2 tablets by mouth daily. 30 tablet 11   senna (SENOKOT) 8.6 MG TABS tablet Take 2 tablets (17.2 mg total) by mouth daily. 120 tablet 2   acetaminophen (TYLENOL) 500 MG tablet Take 2 tablets (1,000 mg total) by mouth every 6 (six) hours as needed  for mild pain. (Patient not taking: Reported on 10/29/2022)     lidocaine-prilocaine (EMLA) cream Apply 1 Application topically as needed. (Patient not taking: Reported on 11/15/2022) 30 g 11   LORazepam (ATIVAN) 0.5 MG tablet Take 1 tablet (0.5 mg total) by mouth every 8 (eight) hours as needed for anxiety (nausea or vomiting). (Patient not taking: Reported on 10/29/2022) 30 tablet 0   ondansetron (ZOFRAN) 8 MG tablet Take 1 tablet (8 mg total) by mouth See admin instructions. If needed, start on Day 3 of chemotherapy, take 1 tablet every 8 hours as needed for nausea or vomiting. (Patient not taking: Reported on 10/25/2022) 90 tablet 0   prochlorperazine (COMPAZINE) 10 MG tablet Take 1  tablet (10 mg total) by mouth every 6 (six) hours as needed for nausea or vomiting. (Patient not taking: Reported on 10/25/2022) 90 tablet 3   No current facility-administered medications for this visit.   Facility-Administered Medications Ordered in Other Visits  Medication Dose Route Frequency Provider Last Rate Last Admin   CARBOplatin (PARAPLATIN) 600 mg in sodium chloride 0.9 % 250 mL chemo infusion  600 mg Intravenous Once [name], MD       famotidine (PEPCID) IVPB 20 mg premix  20 mg Intravenous Once [name], MD       fosaprepitant (EMEND) 150 mg in sodium chloride 0.9 % 145 mL IVPB  150 mg Intravenous Once [name], MD 450 mL/hr at 11/15/22 0946 150 mg at 11/15/22 0946   PACLitaxel (TAXOL) 126 mg in sodium chloride 0.9 % 250 mL chemo infusion (</= 80mg /m2)  80 mg/m2 (Treatment Plan Recorded) Intravenous Once [name], MD       pembrolizumab [facility]) 200 mg in sodium chloride 0.9 % 50 mL chemo infusion  200 mg Intravenous Once [name], MD        Review of Systems  Constitutional:  Negative for appetite change, chills, fatigue and fever.  HENT:   Negative for hearing loss and voice change.   Eyes:  Negative for eye problems.  Respiratory:  Negative for chest tightness and cough.   Cardiovascular:  Negative  for chest pain.  Gastrointestinal:  Negative for abdominal distention, abdominal pain and blood in stool.  Endocrine: Negative for hot flashes.  Genitourinary:  Positive for menstrual problem. Negative for difficulty urinating and frequency.   Musculoskeletal:  Negative for arthralgias.  Skin:  Negative for itching and rash.  Neurological:  Negative for extremity weakness.  Hematological:  Negative for adenopathy.  Psychiatric/Behavioral:  Negative for confusion.      PHYSICAL EXAMINATION: ECOG PERFORMANCE STATUS: 0 - Asymptomatic  Vitals:   11/15/22 0828  BP: 119/77  Pulse: 82  Resp: 18  Temp: (!) 96.4 F (35.8 C)  SpO2: 97%   Filed Weights   11/15/22 0828  Weight: 139 lb 1.6 oz (63.1 kg)    Physical Exam Constitutional:      General: She is not in acute distress.    Appearance: She is not diaphoretic.  HENT:     Head: Normocephalic and atraumatic.  Eyes:     General: No scleral icterus. Cardiovascular:     Rate and Rhythm: Normal rate and regular rhythm.  Pulmonary:     Effort: Pulmonary effort is normal. No respiratory distress.     Breath sounds: No wheezing.  Abdominal:     General: Bowel sounds are normal. There is no distension.     Palpations: Abdomen is soft.  Musculoskeletal:        General: Normal range of motion.     Cervical back: Normal range of motion and neck supple.  Skin:    General: Skin is warm and dry.     Findings: No erythema.  Neurological:     Mental Status: She is alert and oriented to person, place, and time. Mental status is at baseline.     Motor: No abnormal muscle tone.  Psychiatric:        Mood and Affect: Mood and affect normal.     LABORATORY DATA:  I have reviewed the data as listed    Latest Ref Rng & Units 11/15/2022    8:12 AM 11/08/2022    8:03 AM 11/01/2022   11:43 AM  CBC  WBC 4.0 - 10.5 K/uL 15.6  4.9  6.0   Hemoglobin 12.0 - 15.0 g/dL 9.6  9.2  8.7   Hematocrit 36.0 - 46.0 % 32.0  31.7  29.6   Platelets 150  - 400 K/uL 266  289  203       Latest Ref Rng & Units 11/15/2022    8:12 AM 11/08/2022    8:03 AM 11/01/2022   11:43 AM  CMP  Glucose 70 - 99 mg/dL 99  88  578   BUN 6 - 20 mg/dL 12  8  10    Creatinine 0.44 - 1.00 mg/dL 4.69  6.29  5.28   Sodium 135 - 145 mmol/L 137  138  134   Potassium 3.5 - 5.1 mmol/L 3.8  3.7  3.6   Chloride 98 - 111 mmol/L 109  109  104   CO2 22 - 32 mmol/L 24  23  24    Calcium 8.9 - 10.3 mg/dL 8.5  8.6  8.8   Total Protein 6.5 - 8.1 g/dL 6.5  6.9  7.3   Total Bilirubin 0.3 - 1.2 mg/dL 0.2  0.4  0.3   Alkaline Phos 38 - 126 U/L 108  64  76   AST 15 - 41 U/L 29  20  21    ALT 0 - 44 U/L 30  19  27       RADIOGRAPHIC STUDIES: I have personally reviewed the radiological images as listed and agreed with the findings in the report. DG Chest Port 1 View  Result Date: 10/17/2022 CLINICAL DATA:  Port-A-Cath placement EXAM: PORTABLE CHEST 1 VIEW COMPARISON:  None. FINDINGS: Right chest wall port with tip at the expected area of the upper SVC. Cardiac and mediastinal contours are within normal limits for AP technique. Low lung volumes. Lungs are clear. No large pleural effusion or evidence of pneumothorax. IMPRESSION: Right chest wall port with tip at the the upper SVC. Electronically Signed   By: Allegra Lai M.D.   On: 10/17/2022 19:48   DG C-Arm 1-60 Min-No Report  Result Date: 10/17/2022 Fluoroscopy was utilized by the requesting physician.  No radiographic interpretation.   ECHOCARDIOGRAM COMPLETE  Result Date: 10/17/2022    ECHOCARDIOGRAM REPORT   Patient Name:   Penny Gomez Date of Exam: 10/17/2022 Medical Rec #:  [id number]       Height:       60.0 in Accession #:    [id number]      Weight:       135.4 lb Date of Birth:  11-11-1985        BSA:          1.581 m Patient Age:    37 years        BP:           113/64 mmHg Patient Gender: F               HR:           62 bpm. Exam Location:  [facility] Procedure: 2D Echo, Cardiac Doppler, Color Doppler and Strain Analysis  Indications:     Chemo Z09  History:         Patient has no prior history of Echocardiogram examinations. No                  cardiac history on file.  Sonographer:     Cristela Blue Referring Phys:  [id number] Lyliana Dicenso Diagnosing Phys: Julien Nordmann MD  Sonographer Comments: Global longitudinal strain was attempted. IMPRESSIONS  1. Left ventricular ejection fraction, by estimation, is 60 to 65%. The left ventricle has normal function. The left ventricle has no regional wall motion abnormalities. Left ventricular diastolic parameters were normal.  2. Right ventricular systolic function is normal. The right ventricular size is normal.  3. The mitral valve is normal in structure. No evidence of mitral valve regurgitation. No evidence of mitral stenosis.  4. The aortic valve is tricuspid. Aortic valve regurgitation is not visualized. No aortic stenosis is present.  5. The inferior vena cava is normal in size with greater than 50% respiratory variability, suggesting right atrial pressure of 3 mmHg. FINDINGS  Left Ventricle: Left ventricular ejection fraction, by estimation, is 60 to 65%. The left ventricle has normal function. The left ventricle has no regional wall motion abnormalities. The left ventricular internal cavity size was normal in size. There is  no left ventricular hypertrophy. Left ventricular diastolic parameters were normal. Right Ventricle: The right ventricular size is normal. No increase in right ventricular wall thickness. Right ventricular systolic function is normal. Left Atrium: Left atrial size was normal in size. Right Atrium: Right atrial size was normal in size. Pericardium: There is no evidence of pericardial effusion. Mitral Valve: The mitral valve is normal in structure. No evidence of mitral valve regurgitation. No evidence of mitral valve stenosis. MV peak gradient, 4.4 mmHg. The mean mitral valve gradient is 1.0 mmHg. Tricuspid Valve: The tricuspid valve is normal in structure. Tricuspid valve  regurgitation is not demonstrated. No evidence of tricuspid stenosis. Aortic Valve: The aortic valve is tricuspid. Aortic valve regurgitation is not visualized. No aortic stenosis is present. Aortic valve mean gradient measures 3.0 mmHg. Aortic valve peak gradient measures 5.7 mmHg. Aortic valve area, by VTI measures 3.13 cm. Pulmonic Valve: The pulmonic valve was normal in structure. Pulmonic valve regurgitation is not visualized. No evidence of pulmonic stenosis. Aorta: The aortic root is normal in size and structure. Venous: The inferior vena cava is normal in size with greater than 50% respiratory variability, suggesting right atrial pressure of 3 mmHg. IAS/Shunts: No atrial level shunt detected by color flow Doppler.  LEFT VENTRICLE PLAX 2D LVIDd:         4.10 cm   Diastology LVIDs:         2.40 cm   LV e' medial:    12.80 cm/s LV PW:         0.80 cm   LV E/e' medial:  8.2 LV IVS:        0.90 cm   LV e' lateral:   13.30 cm/s LVOT diam:     2.10 cm   LV E/e' lateral: 7.9 LV SV:         78 LV SV Index:   49 LVOT Area:     3.46 cm  RIGHT VENTRICLE RV Basal diam:  3.60 cm RV Mid diam:    2.70 cm RV S prime:     12.60 cm/s LEFT ATRIUM             Index        RIGHT ATRIUM          Index LA diam:        2.80 cm 1.77 cm/m   RA Area:     8.25 cm LA Vol (A2C):   28.3 ml 17.90 ml/m  RA Volume:   13.40 ml 8.47 ml/m LA Vol (A4C):   40.3 ml 25.48 ml/m  LA Biplane Vol: 36.4 ml 23.02 ml/m  AORTIC VALVE AV Area (Vmax):    2.94 cm AV Area (Vmean):   3.05 cm AV Area (VTI):     3.13 cm AV Vmax:           119.00 cm/s AV Vmean:          81.300 cm/s AV VTI:            0.250 m AV Peak Grad:      5.7 mmHg AV Mean Grad:      3.0 mmHg LVOT Vmax:         101.00 cm/s LVOT Vmean:        71.600 cm/s LVOT VTI:          0.226 m LVOT/AV VTI ratio: 0.90  AORTA Ao Root diam: 2.40 cm MITRAL VALVE                TRICUSPID VALVE MV Area (PHT): 3.05 cm     TR Peak grad:   13.4 mmHg MV Area VTI:   2.47 cm     TR Vmax:        183.00 cm/s  MV Peak grad:  4.4 mmHg MV Mean grad:  1.0 mmHg     SHUNTS MV Vmax:       1.05 m/s     Systemic VTI:  0.23 m MV Vmean:      47.5 cm/s    Systemic Diam: 2.10 cm MV Decel Time: 249 msec MV E velocity: 105.00 cm/s MV A velocity: 62.90 cm/s MV E/A ratio:  1.67 Julien Nordmann MD Electronically signed by Julien Nordmann MD Signature Date/Time: 10/17/2022/11:30:44 AM    Final    MR BREAST BILATERAL W WO CONTRAST INC CAD  Result Date: 10/17/2022 CLINICAL DATA:  37 year old female who presented with a palpable lump in the left breast in August of 2024. 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 A left axillary lymph node was biopsied demonstrating reactive changes, without evidence of malignancy. The patient has strong family history of breast cancer. EXAM: BILATERAL BREAST MRI WITH AND WITHOUT CONTRAST TECHNIQUE: Multiplanar, multisequence MR images of both breasts were obtained prior to and following the intravenous administration of 6 ml of Gadavist Three-dimensional MR images were rendered by post-processing of the original MR data on an independent workstation. The three-dimensional MR images were interpreted, and findings are reported in the following complete MRI report for this study. Three dimensional images were evaluated at the independent interpreting workstation using the DynaCAD thin client. COMPARISON:  No prior MRI available for comparison. Correlation made with prior mammogram and ultrasound images. FINDINGS: Breast composition: c. Heterogeneous fibroglandular tissue. Background parenchymal enhancement: Minimal Right breast: No mass or abnormal enhancement. Left breast: In the lower-inner quadrant of the left breast, there is an enhancing mass with surrounding non mass enhancement measuring 3.8 cm. Additionally, there are several scattered small scattered enhancing masses in the inferior anterior left breast, in the lower outer  and lower inner quadrants (series 14, images 30-36). Together with the area of biopsy proven malignancy, the abnormal enhancement spans approximately 6.7 cm. The biopsy marking clip from the ultrasound-guided biopsy is positioned centrally within this broader area of abnormal enhancement. Lymph nodes: The axillae were not included within the field of view on imaging for adequate assessment of the lymph nodes. No abnormal lymph nodes seen along the internal mammary chain. Ancillary findings:  None. IMPRESSION: 1. In total, there  is approximately 6.7 cm of abnormal enhancement, predominantly in the lower-inner inferior left breast, with the ultrasound-guided biopsy marking clip centrally positioned within the enhancement. There are scattered small enhancing masses in the lower inner and lower outer anterior left (included in the measurement above). 2.  No evidence of right breast malignancy. 3. The axillary lymph nodes are not included within the field of view of this MRI for adequate assessment of lymphadenopathy. RECOMMENDATION: MRI guided biopsy is recommended for the anterior and posterior aspect of non mass enhancement in the right breast to establish extent of disease, if the patient is considering breast conservation. BI-RADS CATEGORY  4: Suspicious. Electronically Signed   By: Frederico Hamman M.D.   On: 10/17/2022 10:40

## 2022-11-15 NOTE — Assessment & Plan Note (Signed)
Labs are reviewed and discussed with patient. Lab Results  Component Value Date   HGB 9.6 (L) 11/15/2022   TIBC 511 (H) 10/11/2022   IRONPCTSAT 3 (L) 10/11/2022   FERRITIN 2 (L) 10/11/2022    Recommend one dose of venofer next week.

## 2022-11-15 NOTE — Telephone Encounter (Signed)
-----   Message from [name] sent at 11/14/2022  8:52 PM EDT ----- Urine culture is not diagnostic. Please arrange her to re- collect.

## 2022-11-15 NOTE — Assessment & Plan Note (Addendum)
Once she finished treatment of breast cancer, recommend risk-reducing salpingo oophorectomy  pancreatic screening at age 37 (or 71 years younger than the earliest exocrine pancreatic cancer diagnosis in the family, whichever is earlier)  Family members are encouraged to consider genetic testing for this familial pathogenic variant.

## 2022-11-18 ENCOUNTER — Encounter: Payer: Self-pay | Admitting: Oncology

## 2022-11-18 ENCOUNTER — Inpatient Hospital Stay: Payer: Medicaid Other

## 2022-11-18 ENCOUNTER — Other Ambulatory Visit: Payer: Self-pay | Admitting: Emergency Medicine

## 2022-11-18 VITALS — BP 110/62 | HR 85 | Temp 97.8°F | Resp 16

## 2022-11-18 DIAGNOSIS — Z5112 Encounter for antineoplastic immunotherapy: Secondary | ICD-10-CM | POA: Diagnosis not present

## 2022-11-18 DIAGNOSIS — D500 Iron deficiency anemia secondary to blood loss (chronic): Secondary | ICD-10-CM

## 2022-11-18 MED ORDER — SODIUM CHLORIDE 0.9 % IV SOLN
200.0000 mg | Freq: Once | INTRAVENOUS | Status: AC
  Administered 2022-11-18: 200 mg via INTRAVENOUS
  Filled 2022-11-18: qty 200

## 2022-11-18 MED ORDER — SODIUM CHLORIDE 0.9 % IV SOLN
Freq: Once | INTRAVENOUS | Status: AC
  Filled 2022-11-18: qty 250

## 2022-11-18 MED ORDER — HEPARIN SOD (PORK) LOCK FLUSH 100 UNIT/ML IV SOLN
500.0000 [IU] | Freq: Once | INTRAVENOUS | Status: DC | PRN
  Filled 2022-11-18: qty 5

## 2022-11-21 MED FILL — Dexamethasone Sodium Phosphate Inj 100 MG/10ML: INTRAMUSCULAR | Qty: 1 | Status: AC

## 2022-11-22 ENCOUNTER — Inpatient Hospital Stay: Payer: Medicaid Other

## 2022-11-22 ENCOUNTER — Encounter: Payer: Self-pay | Admitting: Oncology

## 2022-11-22 ENCOUNTER — Other Ambulatory Visit: Payer: Self-pay

## 2022-11-22 VITALS — Wt 136.7 lb

## 2022-11-22 VITALS — BP 100/71 | HR 70 | Temp 97.0°F | Resp 16

## 2022-11-22 DIAGNOSIS — Z5112 Encounter for antineoplastic immunotherapy: Secondary | ICD-10-CM | POA: Diagnosis not present

## 2022-11-22 DIAGNOSIS — C50919 Malignant neoplasm of unspecified site of unspecified female breast: Secondary | ICD-10-CM

## 2022-11-22 LAB — CMP (CANCER CENTER ONLY)
ALT: 25 U/L (ref 0–44)
AST: 19 U/L (ref 15–41)
Albumin: 3.9 g/dL (ref 3.5–5.0)
Alkaline Phosphatase: 71 U/L (ref 38–126)
Anion gap: 4 — ABNORMAL LOW (ref 5–15)
BUN: 12 mg/dL (ref 6–20)
CO2: 26 mmol/L (ref 22–32)
Calcium: 8.8 mg/dL — ABNORMAL LOW (ref 8.9–10.3)
Chloride: 106 mmol/L (ref 98–111)
Creatinine: 0.53 mg/dL (ref 0.44–1.00)
GFR, Estimated: >60 mL/min (ref >60)
Glucose, Bld: 79 mg/dL (ref 70–99)
Potassium: 3.7 mmol/L (ref 3.5–5.1)
Sodium: 136 mmol/L (ref 135–145)
Total Bilirubin: 0.4 mg/dL (ref 0.3–1.2)
Total Protein: 7.3 g/dL (ref 6.5–8.1)

## 2022-11-22 LAB — CBC WITH DIFFERENTIAL (CANCER CENTER ONLY)
Abs Immature Granulocytes: 0.02 10*3/uL (ref 0.00–0.07)
Basophils Absolute: 0 10*3/uL (ref 0.0–0.1)
Basophils Relative: 1 %
Eosinophils Absolute: 0 10*3/uL (ref 0.0–0.5)
Eosinophils Relative: 0 %
HCT: 32.5 % — ABNORMAL LOW (ref 36.0–46.0)
Hemoglobin: 10 g/dL — ABNORMAL LOW (ref 12.0–15.0)
Immature Granulocytes: 1 %
Lymphocytes Relative: 41 %
Lymphs Abs: 1.4 10*3/uL (ref 0.7–4.0)
MCH: 24.3 pg — ABNORMAL LOW (ref 26.0–34.0)
MCHC: 30.8 g/dL (ref 30.0–36.0)
MCV: 79.1 fL — ABNORMAL LOW (ref 80.0–100.0)
Monocytes Absolute: 0.2 10*3/uL (ref 0.1–1.0)
Monocytes Relative: 5 %
Neutro Abs: 1.8 10*3/uL (ref 1.7–7.7)
Neutrophils Relative %: 52 %
Platelet Count: 115 10*3/uL — ABNORMAL LOW (ref 150–400)
RBC: 4.11 MIL/uL (ref 3.87–5.11)
Smear Review: NORMAL
WBC Count: 3.4 10*3/uL — ABNORMAL LOW (ref 4.0–10.5)
nRBC: 0 % (ref 0.0–0.2)

## 2022-11-22 MED ORDER — SODIUM CHLORIDE 0.9 % IV SOLN
Freq: Once | INTRAVENOUS | Status: AC
  Filled 2022-11-22: qty 250

## 2022-11-22 MED ORDER — SODIUM CHLORIDE 0.9% FLUSH
10.0000 mL | INTRAVENOUS | Status: DC | PRN
  Administered 2022-11-22: 10 mL
  Filled 2022-11-22: qty 10

## 2022-11-22 MED ORDER — FAMOTIDINE IN NACL 20-0.9 MG/50ML-% IV SOLN
20.0000 mg | Freq: Once | INTRAVENOUS | Status: AC
  Administered 2022-11-22: 20 mg via INTRAVENOUS
  Filled 2022-11-22: qty 50

## 2022-11-22 MED ORDER — ALTEPLASE 2 MG IJ SOLR
2.0000 mg | Freq: Once | INTRAMUSCULAR | Status: AC | PRN
  Administered 2022-11-22: 2 mg
  Filled 2022-11-22: qty 2

## 2022-11-22 MED ORDER — HEPARIN SOD (PORK) LOCK FLUSH 100 UNIT/ML IV SOLN
500.0000 [IU] | Freq: Once | INTRAVENOUS | Status: AC | PRN
  Administered 2022-11-22: 500 [IU]
  Filled 2022-11-22: qty 5

## 2022-11-22 MED ORDER — SODIUM CHLORIDE 0.9 % IV SOLN
10.0000 mg | Freq: Once | INTRAVENOUS | Status: AC
  Administered 2022-11-22: 10 mg via INTRAVENOUS
  Filled 2022-11-22: qty 10

## 2022-11-22 MED ORDER — DIPHENHYDRAMINE HCL 50 MG/ML IJ SOLN
50.0000 mg | Freq: Once | INTRAMUSCULAR | Status: AC
  Administered 2022-11-22: 50 mg via INTRAVENOUS
  Filled 2022-11-22: qty 1

## 2022-11-22 MED ORDER — SODIUM CHLORIDE 0.9 % IV SOLN
80.0000 mg/m2 | Freq: Once | INTRAVENOUS | Status: AC
  Administered 2022-11-22: 126 mg via INTRAVENOUS
  Filled 2022-11-22: qty 21

## 2022-11-22 MED ORDER — SODIUM CHLORIDE 0.9% FLUSH
10.0000 mL | INTRAVENOUS | Status: DC | PRN
  Administered 2022-11-22: 10 mL via INTRAVENOUS
  Filled 2022-11-22: qty 10

## 2022-11-22 NOTE — Patient Instructions (Signed)
[facility]  Discharge Instructions: Thank you for choosing [facility] to provide your oncology and hematology care.  If you have a lab appointment with the [facility], please go directly to the [facility] and check in at the registration area.  Wear comfortable clothing and clothing appropriate for easy access to any Portacath or PICC line.   We strive to give you quality time with your provider. You may need to reschedule your appointment if you arrive late (15 or more minutes).  Arriving late affects you and other patients whose appointments are after yours.  Also, if you miss three or more appointments without notifying the office, you may be dismissed from the clinic at the provider's discretion.      For prescription refill requests, have your pharmacy contact our office and allow 72 hours for refills to be completed.    Today you received the following chemotherapy and/or immunotherapy agents- taxol      To help prevent nausea and vomiting after your treatment, we encourage you to take your nausea medication as directed.  BELOW ARE SYMPTOMS THAT SHOULD BE REPORTED IMMEDIATELY: *FEVER GREATER THAN 100.4 F (38 C) OR HIGHER *CHILLS OR SWEATING *NAUSEA AND VOMITING THAT IS NOT CONTROLLED WITH YOUR NAUSEA MEDICATION *UNUSUAL SHORTNESS OF BREATH *UNUSUAL BRUISING OR BLEEDING *URINARY PROBLEMS (pain or burning when urinating, or frequent urination) *BOWEL PROBLEMS (unusual diarrhea, constipation, pain near the anus) TENDERNESS IN MOUTH AND THROAT WITH OR WITHOUT PRESENCE OF ULCERS (sore throat, sores in mouth, or a toothache) UNUSUAL RASH, SWELLING OR PAIN  UNUSUAL VAGINAL DISCHARGE OR ITCHING   Items with * indicate a potential emergency and should be followed up as soon as possible or go to the Emergency Department if any problems should occur.  Please show the CHEMOTHERAPY ALERT CARD or IMMUNOTHERAPY ALERT CARD at check-in to the  Emergency Department and triage nurse.  Should you have questions after your visit or need to cancel or reschedule your appointment, please contact [facility]  [phone] and follow the prompts.  Office hours are 8:00 a.m. to 4:30 p.m. Monday - Friday. Please note that voicemails left after 4:00 p.m. may not be returned until the following business day.  We are closed weekends and major holidays. You have access to a nurse at all times for urgent questions. Please call the main number to the clinic [phone] and follow the prompts.  For any non-urgent questions, you may also contact your provider using MyChart. We now offer e-Visits for anyone 10 and older to request care online for non-urgent symptoms. For details visit [email or web address].   Also download the MyChart app! Go to the app store, search "MyChart", open the app, select [facility], and log in with your MyChart username and password.

## 2022-11-22 NOTE — Progress Notes (Signed)
Complete cbc results still pending, however, per lab, WBC=3.5, ANC=1.8, Hg=10.1, PLT=115.

## 2022-11-24 ENCOUNTER — Emergency Department: Payer: Medicaid Other

## 2022-11-24 ENCOUNTER — Observation Stay
Admission: EM | Admit: 2022-11-24 | Discharge: 2022-11-25 | Disposition: A | Discharge status: Home / Self Care | Payer: Medicaid Other | Attending: Obstetrics and Gynecology | Admitting: Obstetrics and Gynecology

## 2022-11-24 ENCOUNTER — Other Ambulatory Visit: Payer: Self-pay

## 2022-11-24 DIAGNOSIS — Z808 Family history of malignant neoplasm of other organs or systems: Secondary | ICD-10-CM

## 2022-11-24 DIAGNOSIS — Z86711 Personal history of pulmonary embolism: Secondary | ICD-10-CM | POA: Diagnosis present

## 2022-11-24 DIAGNOSIS — C50912 Malignant neoplasm of unspecified site of left female breast: Secondary | ICD-10-CM | POA: Diagnosis present

## 2022-11-24 DIAGNOSIS — Z803 Family history of malignant neoplasm of breast: Secondary | ICD-10-CM

## 2022-11-24 DIAGNOSIS — Z801 Family history of malignant neoplasm of trachea, bronchus and lung: Secondary | ICD-10-CM

## 2022-11-24 DIAGNOSIS — Z1152 Encounter for screening for COVID-19: Secondary | ICD-10-CM | POA: Diagnosis not present

## 2022-11-24 DIAGNOSIS — Z800 Family history of malignant neoplasm of digestive organs: Secondary | ICD-10-CM

## 2022-11-24 DIAGNOSIS — C50919 Malignant neoplasm of unspecified site of unspecified female breast: Secondary | ICD-10-CM | POA: Diagnosis present

## 2022-11-24 DIAGNOSIS — D84821 Immunodeficiency due to drugs: Secondary | ICD-10-CM

## 2022-11-24 DIAGNOSIS — Z7901 Long term (current) use of anticoagulants: Secondary | ICD-10-CM

## 2022-11-24 DIAGNOSIS — R000 Tachycardia, unspecified: Principal | ICD-10-CM

## 2022-11-24 DIAGNOSIS — J189 Pneumonia, unspecified organism: Secondary | ICD-10-CM

## 2022-11-24 DIAGNOSIS — J188 Other pneumonia, unspecified organism: Secondary | ICD-10-CM | POA: Diagnosis not present

## 2022-11-24 DIAGNOSIS — T82598A Other mechanical complication of other cardiac and vascular devices and implants, initial encounter: Secondary | ICD-10-CM | POA: Insufficient documentation

## 2022-11-24 DIAGNOSIS — Z789 Other specified health status: Secondary | ICD-10-CM

## 2022-11-24 DIAGNOSIS — T829XXA Unspecified complication of cardiac and vascular prosthetic device, implant and graft, initial encounter: Secondary | ICD-10-CM

## 2022-11-24 DIAGNOSIS — D849 Immunodeficiency, unspecified: Secondary | ICD-10-CM

## 2022-11-24 DIAGNOSIS — Z79899 Other long term (current) drug therapy: Secondary | ICD-10-CM

## 2022-11-24 DIAGNOSIS — I2699 Other pulmonary embolism without acute cor pulmonale: Principal | ICD-10-CM

## 2022-11-24 DIAGNOSIS — T451X5A Adverse effect of antineoplastic and immunosuppressive drugs, initial encounter: Secondary | ICD-10-CM | POA: Diagnosis present

## 2022-11-24 LAB — COMPREHENSIVE METABOLIC PANEL
ALT: 23 U/L (ref 0–44)
AST: 20 U/L (ref 15–41)
Albumin: 3.9 g/dL (ref 3.5–5.0)
Alkaline Phosphatase: 67 U/L (ref 38–126)
Anion gap: 10 (ref 5–15)
BUN: 10 mg/dL (ref 6–20)
CO2: 23 mmol/L (ref 22–32)
Calcium: 8.8 mg/dL — ABNORMAL LOW (ref 8.9–10.3)
Chloride: 102 mmol/L (ref 98–111)
Creatinine, Ser: 0.44 mg/dL (ref 0.44–1.00)
GFR, Estimated: >60 mL/min (ref >60)
Glucose, Bld: 111 mg/dL — ABNORMAL HIGH (ref 70–99)
Potassium: 3.6 mmol/L (ref 3.5–5.1)
Sodium: 135 mmol/L (ref 135–145)
Total Bilirubin: 0.5 mg/dL (ref 0.3–1.2)
Total Protein: 7.6 g/dL (ref 6.5–8.1)

## 2022-11-24 LAB — CBC WITH DIFFERENTIAL/PLATELET
Abs Immature Granulocytes: 0.01 10*3/uL (ref 0.00–0.07)
Basophils Absolute: 0 10*3/uL (ref 0.0–0.1)
Basophils Relative: 0 %
Eosinophils Absolute: 0 10*3/uL (ref 0.0–0.5)
Eosinophils Relative: 0 %
HCT: 31.6 % — ABNORMAL LOW (ref 36.0–46.0)
Hemoglobin: 9.6 g/dL — ABNORMAL LOW (ref 12.0–15.0)
Immature Granulocytes: 0 %
Lymphocytes Relative: 33 %
Lymphs Abs: 1.6 10*3/uL (ref 0.7–4.0)
MCH: 24.2 pg — ABNORMAL LOW (ref 26.0–34.0)
MCHC: 30.4 g/dL (ref 30.0–36.0)
MCV: 79.6 fL — ABNORMAL LOW (ref 80.0–100.0)
Monocytes Absolute: 0.3 10*3/uL (ref 0.1–1.0)
Monocytes Relative: 5 %
Neutro Abs: 2.9 10*3/uL (ref 1.7–7.7)
Neutrophils Relative %: 62 %
Platelets: 112 10*3/uL — ABNORMAL LOW (ref 150–400)
RBC: 3.97 MIL/uL (ref 3.87–5.11)
Smear Review: NORMAL
WBC: 4.8 10*3/uL (ref 4.0–10.5)
nRBC: 0 % (ref 0.0–0.2)

## 2022-11-24 LAB — PROCALCITONIN: Procalcitonin: 0.14 ng/mL

## 2022-11-24 LAB — PROTIME-INR
INR: 1.1 (ref 0.8–1.2)
Prothrombin Time: 14.3 s (ref 11.4–15.2)

## 2022-11-24 LAB — APTT: aPTT: 28 s (ref 24–36)

## 2022-11-24 LAB — LACTIC ACID, PLASMA: Lactic Acid, Venous: 0.8 mmol/L (ref 0.5–1.9)

## 2022-11-24 MED ORDER — LACTATED RINGERS IV BOLUS (SEPSIS)
1000.0000 mL | Freq: Once | INTRAVENOUS | Status: AC
  Administered 2022-11-24: 1000 mL via INTRAVENOUS

## 2022-11-24 MED ORDER — SODIUM CHLORIDE 0.9 % IV SOLN: 2.0000 g | INTRAVENOUS | Status: DC

## 2022-11-24 MED ORDER — ONDANSETRON HCL 4 MG/2ML IJ SOLN
4.0000 mg | Freq: Once | INTRAMUSCULAR | Status: AC
  Administered 2022-11-24: 4 mg via INTRAVENOUS
  Filled 2022-11-24: qty 2

## 2022-11-24 MED ORDER — HEPARIN (PORCINE) 25000 UT/250ML-% IV SOLN
1100.0000 [IU]/h | INTRAVENOUS | Status: DC
  Administered 2022-11-24: 1000 [IU]/h via INTRAVENOUS
  Filled 2022-11-24: qty 250

## 2022-11-24 MED ORDER — SODIUM CHLORIDE 0.9 % IV SOLN: 500.0000 mg | INTRAVENOUS | Status: DC

## 2022-11-24 MED ORDER — ONDANSETRON HCL 4 MG PO TABS: 4.0000 mg | ORAL_TABLET | Freq: Four times a day (QID) | ORAL | Status: DC | PRN

## 2022-11-24 MED ORDER — ONDANSETRON HCL 4 MG/2ML IJ SOLN: 4.0000 mg | Freq: Four times a day (QID) | INTRAMUSCULAR | Status: DC | PRN

## 2022-11-24 MED ORDER — HEPARIN BOLUS VIA INFUSION
4000.0000 [IU] | Freq: Once | INTRAVENOUS | Status: AC
  Administered 2022-11-24: 4000 [IU] via INTRAVENOUS
  Filled 2022-11-24: qty 4000

## 2022-11-24 MED ORDER — IOHEXOL 350 MG/ML SOLN
75.0000 mL | Freq: Once | INTRAVENOUS | Status: AC | PRN
  Administered 2022-11-24: 75 mL via INTRAVENOUS

## 2022-11-24 MED ORDER — ACETAMINOPHEN 650 MG RE SUPP: 650.0000 mg | Freq: Four times a day (QID) | RECTAL | Status: DC | PRN

## 2022-11-24 MED ORDER — GUAIFENESIN-DM 100-10 MG/5ML PO SYRP: 5.0000 mL | ORAL_SOLUTION | ORAL | Status: DC | PRN

## 2022-11-24 MED ORDER — ACETAMINOPHEN 325 MG PO TABS
650.0000 mg | ORAL_TABLET | Freq: Four times a day (QID) | ORAL | Status: DC | PRN
  Administered 2022-11-24: 650 mg via ORAL

## 2022-11-24 MED ORDER — SODIUM CHLORIDE 0.9 % IV SOLN
500.0000 mg | Freq: Once | INTRAVENOUS | Status: AC
  Administered 2022-11-24: 500 mg via INTRAVENOUS
  Filled 2022-11-24: qty 5

## 2022-11-24 MED ORDER — SODIUM CHLORIDE 0.9 % IV SOLN
2.0000 g | Freq: Once | INTRAVENOUS | Status: AC
  Administered 2022-11-24: 2 g via INTRAVENOUS
  Filled 2022-11-24: qty 20

## 2022-11-24 MED ORDER — ACETAMINOPHEN 325 MG PO TABS
ORAL_TABLET | ORAL | Status: AC
  Filled 2022-11-24: qty 2

## 2022-11-24 NOTE — ED Provider Notes (Signed)
Assumed patient care from Corliss Parish MD.  Patient with new pulmonary emboli on CTA and findings suggestive of multifocal pneumonia.  Patient was heparinized in the emergency department and given IV Rocephin and azithromycin.  I reviewed patient case with vascular specialist, Dr. Juanetta Gosling who recommended echo in the a.m. to confirm no heart strain.  I also discussed patient case with oncologist, Dr. Donneta Romberg who stated that he would communicate admission to patient's oncologist.   Orvil Feil, PA-C 11/24/22 2149    Loleta Rose, MD 11/26/22 [phone]

## 2022-11-24 NOTE — Assessment & Plan Note (Addendum)
Right lower lobe pulmonary infarct LOV:FIEPPI right main pulmonary artery extending into segmental branches of the right lower lobe and right middle lobe. No right heart strain. Continue heparin infusion Echo cardiogram to evaluate for heart strain Vascular consult Will keep n.p.o. from midnight in case of procedure

## 2022-11-24 NOTE — H&P (Signed)
History and Physical    Patient: Penny Gomez:[id number] DOB: 05-04-1985 DOA: 11/24/2022 DOS: the patient was seen and examined on 11/24/2022 PCP: Shane Crutch, PA  Patient coming from: Home  Chief Complaint:  Chief Complaint  Patient presents with   Vascular Access Problem    HPI: Penny Gomez is a 37 y.o. 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  She denied pain in the area, fever or chills but did endorses a recent bout of coughing in which she coughed up something that tasted foul.  The coughing has resolved and she denies shortness of breath, fever or chills, nausea, vomiting or abdominal pain.  Endorses feeling at her baseline. During initial assessment in the ED she was found to have unexplained tachycardia 111 and workup was pursued in view of her immuno suppressed state. ED course and data review: Tachycardic to 111 with otherwise normal vitals Labs: WBC 4800 with lactic acid 0.8 and procalcitonin 0.14. Hemoglobin and platelets at baseline at 9.6 and 112,000 respectively CMP unremarkable CTA chest was positive for PE as well as multifocal pneumonia and possible pulmonary infarct as detailed below: IMPRESSION: 1. Pulmonary emboli within the distal right main pulmonary artery extending into segmental branches of the right lower lobe and right middle lobe. No evidence for right heart strain. 2. Multifocal patchy ground-glass and airspace opacities in the right middle lobe and right lower lobe worrisome for multifocal pneumonia. 3. Small peripheral opacity in the posterior right lower lobe may represent pulmonary infarct.  Patient was started on heparin infusion as well as Rocephin and azithromycin. Hospitalist  consulted for admission.   Review of Systems: As mentioned in the history of present illness. All other systems reviewed and are negative.  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   Past Surgical History:  Procedure Laterality Date   BREAST BIOPSY Right 05/20/2019   Affirm Bx- X- clip, neg   BREAST BIOPSY Left 10/02/2022   [place] Bx, path pending   BREAST BIOPSY Left 10/02/2022   [place] Bx Node- path pending   BREAST BIOPSY Left 10/02/2022   [place] LT BREAST BX W LOC DEV 1ST LESION IMG BX SPEC US GUIDE 10/02/2022 [facility]   CESAREAN SECTION     LAPAROSCOPIC BILATERAL SALPINGECTOMY Bilateral 02/09/2022   Procedure: LAPAROSCOPIC BILATERAL SALPINGECTOMY;  Surgeon: Hildred Laser, MD;  Location: [facility];  Service: Gynecology;  Laterality: Bilateral;   PORTACATH PLACEMENT Right 10/17/2022   Procedure: INSERTION PORT-A-CATH;  Surgeon: Henrene Dodge, MD;  Location: [facility];  Service: General;  Laterality: Right;   TUBAL LIGATION     Social History:  reports that she has never smoked. She has never used smokeless tobacco. She reports current alcohol use. She reports that she does not use drugs.  No Known Allergies  Family History  Problem Relation Age of Onset   Breast cancer Mother 67       no GT   Breast cancer Maternal Aunt 33   Breast cancer Maternal Aunt    Lung cancer Maternal Uncle    Lung cancer Maternal  Uncle    Bone cancer Paternal Aunt    Pancreatic cancer Maternal Grandmother 43   Lung cancer Maternal Grandfather 34   Breast cancer Cousin 40   Breast cancer Other 33       gene pos?    Prior to Admission medications   Medication Sig Start Date End Date Taking? Authorizing Provider  acetaminophen (TYLENOL) 500 MG tablet Take 2 tablets (1,000 mg total) by mouth every 6 (six) hours as needed for mild pain. Patient  not taking: Reported on 10/29/2022 10/17/22   Henrene Dodge, MD  Calcium Carbonate-Vitamin D (CALCIUM 600 +D HIGH POTENCY) 600-10 MG-MCG TABS Take 1 tablet by mouth 2 (two) times daily. 11/15/22   [name], MD  lidocaine-prilocaine (EMLA) cream Apply 1 Application topically as needed. Patient not taking: Reported on 11/15/2022 10/21/22   [name], MD  LORazepam (ATIVAN) 0.5 MG tablet Take 1 tablet (0.5 mg total) by mouth every 8 (eight) hours as needed for anxiety (nausea or vomiting). Patient not taking: Reported on 10/29/2022 10/25/22   [name], MD  ondansetron (ZOFRAN) 8 MG tablet Take 1 tablet (8 mg total) by mouth See admin instructions. If needed, start on Day 3 of chemotherapy, take 1 tablet every 8 hours as needed for nausea or vomiting. Patient not taking: Reported on 10/25/2022 10/21/22   [name], MD  prochlorperazine (COMPAZINE) 10 MG tablet Take 1 tablet (10 mg total) by mouth every 6 (six) hours as needed for nausea or vomiting. Patient not taking: Reported on 10/25/2022 10/21/22   [name], MD  senna (SENOKOT) 8.6 MG TABS tablet Take 2 tablets (17.2 mg total) by mouth daily. 11/15/22   [name], MD    Physical Exam: Vitals:   11/24/22 1502 11/24/22 2201 11/24/22 2202  BP: 125/84  124/87  Pulse: (!) 111  95  Resp: 18  18  Temp: 98 F (36.7 C)  98.1 F (36.7 C)  TempSrc:   Oral  SpO2: 100%  100%  Weight:  64 kg 64 kg  Height:  5' (1.524 m)    Physical Exam Vitals and nursing note reviewed.  Constitutional:      General: She is not in acute distress. HENT:     Head: Normocephalic and atraumatic.  Cardiovascular:     Rate and Rhythm: Normal rate and regular rhythm.     Heart sounds: Normal heart sounds.  Pulmonary:     Effort: Pulmonary effort is normal.     Breath sounds: Normal breath sounds.  Abdominal:     Palpations: Abdomen is soft.     Tenderness: There is no abdominal tenderness.  Neurological:     Mental Status: Mental status is at baseline.     Labs on Admission:  I have personally reviewed following labs and imaging studies  CBC: Recent Labs  Lab 11/22/22 0907 11/24/22 1759  WBC 3.4* 4.8  NEUTROABS 1.8 2.9  HGB 10.0* 9.6*  HCT 32.5* 31.6*  MCV 79.1* 79.6*  PLT 115* 112*   Basic Metabolic Panel: Recent Labs  Lab 11/22/22 0907 11/24/22 1759  NA 136 135  K 3.7 3.6  CL 106 102  CO2 26 23  GLUCOSE 79 111*  BUN 12 10  CREATININE 0.53 0.44  CALCIUM 8.8* 8.8*   GFR: Estimated Creatinine Clearance: 81.2 mL/min (by C-G formula based on SCr of 0.44 mg/dL). Liver Function Tests: Recent Labs  Lab 11/22/22 0907 11/24/22 1759  AST 19 20  ALT 25 23  ALKPHOS 71 67  BILITOT 0.4 0.5  PROT 7.3 7.6  ALBUMIN 3.9 3.9   No results for input(s): "LIPASE", "AMYLASE" in the last 168 hours. No results for input(s): "AMMONIA" in the last 168 hours. Coagulation Profile: Recent Labs  Lab 11/24/22 1759  INR 1.1   Cardiac Enzymes: No results for input(s): "CKTOTAL", "CKMB", "CKMBINDEX", "TROPONINI" in the last 168 hours. BNP (last 3 results) No results for input(s): "PROBNP" in the last 8760 hours. HbA1C: No results for input(s): "HGBA1C" in the last 72 hours. CBG: No results for input(s): "GLUCAP" in the last 168 hours. Lipid Profile: No results for input(s): "CHOL", "HDL", "LDLCALC", "TRIG", "CHOLHDL", "LDLDIRECT" in the last 72 hours. Thyroid Function Tests: No results for input(s): "TSH", "T4TOTAL", "FREET4", "T3FREE", "THYROIDAB" in the last 72 hours. Anemia Panel: No results for input(s): "VITAMINB12", "FOLATE", "FERRITIN", "TIBC", "IRON", "RETICCTPCT" in the last 72 hours. Urine analysis:    Component Value Date/Time   COLORURINE YELLOW (A) 11/13/2022 1200   APPEARANCEUR HAZY (A) 11/13/2022 1200   APPEARANCEUR Hazy 12/04/2011 1032   LABSPEC 1.023 11/13/2022 1200   LABSPEC 1.012 12/04/2011 1032   PHURINE 8.0 11/13/2022 1200   GLUCOSEU NEGATIVE 11/13/2022 1200   GLUCOSEU Negative 12/04/2011 1032   HGBUR LARGE (A) 11/13/2022 1200    BILIRUBINUR NEGATIVE 11/13/2022 1200   BILIRUBINUR Negative 03/19/2022 0824   BILIRUBINUR Negative 12/04/2011 1032   KETONESUR NEGATIVE 11/13/2022 1200   PROTEINUR NEGATIVE 11/13/2022 1200   UROBILINOGEN 0.2 03/19/2022 0824   NITRITE NEGATIVE 11/13/2022 1200   LEUKOCYTESUR TRACE (A) 11/13/2022 1200   LEUKOCYTESUR Negative 12/04/2011 1032    Radiological Exams on Admission: CT Angio Chest PE W and/or Wo Contrast  Result Date: 11/24/2022 CLINICAL DATA:  High probability for PE. EXAM: CT ANGIOGRAPHY CHEST WITH CONTRAST TECHNIQUE: Multidetector CT imaging of the chest was performed using the standard protocol during bolus administration of intravenous contrast. Multiplanar CT image reconstructions and MIPs were obtained to evaluate the vascular anatomy. RADIATION DOSE REDUCTION: This exam was performed according to the departmental dose-optimization program which includes automated exposure control, adjustment of the mA and/or kV according to patient size and/or use of iterative reconstruction technique. CONTRAST:  75mL OMNIPAQUE IOHEXOL 350 MG/ML SOLN COMPARISON:  None Available. FINDINGS: Cardiovascular: Pulmonary emboli are seen within the distal right main pulmonary artery extending into segmental branches of the right lower lobe and right middle lobe. Heart is normal in size. Aorta is normal in size. Right chest port catheter tip ends in the right atrium. There is no pericardial effusion. Mediastinum/Nodes: No enlarged mediastinal, hilar, or axillary lymph nodes. Thyroid gland, trachea, and esophagus demonstrate no significant findings. Lungs/Pleura: Multifocal patchy ground-glass and airspace opacities are seen in the right middle lobe and right lower lobe. There is a small peripheral opacity in the posterior right lower lobe which may represent pulmonary infarct. There is no pleural effusion or pneumothorax. Upper Abdomen: No acute abnormality. Musculoskeletal: No chest wall abnormality. No  acute or significant osseous findings. Review of the MIP images confirms the above findings. IMPRESSION: 1. Pulmonary emboli within the distal right main pulmonary artery extending into segmental branches of the right lower lobe and right middle lobe. No evidence for right heart strain. 2. Multifocal patchy ground-glass and airspace opacities in the right middle lobe and right lower lobe worrisome for multifocal pneumonia. 3. Small peripheral opacity in the posterior right lower lobe may represent pulmonary infarct. Electronically Signed   By: Darliss Cheney M.D.   On: 11/24/2022 20:49   DG Chest 2  View  Result Date: 11/24/2022 CLINICAL DATA:  Bleeding from port site. EXAM: CHEST - 2 VIEW COMPARISON:  Chest x-ray dated October 17, 2022. FINDINGS: Right chest wall port catheter in unchanged position with the tip in the proximal SVC. The heart size and mediastinal contours are within normal limits. Normal pulmonary vascularity. New patchy opacities in the right lower lobe. Clear left lung. No pleural effusion or pneumothorax. No acute osseous abnormality. IMPRESSION: 1. Unchanged position of the right chest wall port catheter. 2. New right lower lobe pneumonia. Electronically Signed   By: Obie Dredge M.D.   On: 11/24/2022 16:41     Data Reviewed: Relevant notes from primary care and specialist visits, past discharge summaries as available in EHR, including Care Everywhere. Prior diagnostic testing as pertinent to current admission diagnoses Updated medications and problem lists for reconciliation ED course, including vitals, labs, imaging, treatment and response to treatment Triage notes, nursing and pharmacy notes and ED provider's notes Notable results as noted in HPI   Assessment and Plan: * Acute pulmonary embolism (HCC) Right lower lobe pulmonary infarct NWG:NFAOZH right main pulmonary artery extending into segmental branches of the right lower lobe and right middle lobe. No right heart  strain. Continue heparin infusion Echo cardiogram to evaluate for heart strain Vascular consult Will keep n.p.o. from midnight in case of procedure  Multifocal pneumonia Immunosuppressed due to chemotherapy CTA showing multifocal pneumonia COVID-negative Follow blood cultures Continue Rocephin and azithromycin Antitussives, incentive spirometer, DuoNebs as needed Supplemental oxygen if needed  Vascular port complication Patient reports intermittent bleeding from port No obvious problem on CT chest: "Right chest port catheter tip ends in the right atrium" Surgical consult to assess Monitor H&H  Invasive carcinoma of breast (HCC) On neoadjuvant chemotherapy, last infusion 11/22/2022 Heme-onc with patient in house, but ED provider   DVT prophylaxis: Heparin infusion  Consults: Vascular and surgical consult/Dr. Gilda Crease and Dr. Claudine Mouton  Advance Care Planning:   Code Status: Prior   Family Communication: none  Disposition Plan: Back to previous home environment  Severity of Illness: The appropriate patient status for this patient is INPATIENT. Inpatient status is judged to be reasonable and necessary in order to provide the required intensity of service to ensure the patient's safety. The patient's presenting symptoms, physical exam findings, and initial radiographic and laboratory data in the context of their chronic comorbidities is felt to place them at high risk for further clinical deterioration. Furthermore, it is not anticipated that the patient will be medically stable for discharge from the hospital within 2 midnights of admission.   * I certify that at the point of admission it is my clinical judgment that the patient will require inpatient hospital care spanning beyond 2 midnights from the point of admission due to high intensity of service, high risk for further deterioration and high frequency of surveillance required.*  Author: Andris Baumann, MD 11/24/2022 10:57  PM  For on call review [email or web address].

## 2022-11-24 NOTE — ED Provider Notes (Signed)
[facility] Provider Note    Event Date/Time   First MD Initiated Contact with Patient 11/24/22 1711     (approximate)   History   Vascular Access Problem   HPI Penny Gomez is a 36 y.o. female who is currently immunocompromise on chemotherapy for breast cancer under the care of Dr. Cathie Hoops.  She gets weekly chemotherapy treatments and had her last treatment 2 days ago.  She presents tonight with concerns of around her port site.  She said that she had a port placed in her right chest about a month ago.  She had some issues where there was a little bit of a scab after her last treatment 2 days ago.  Today she was in the shower and felt something "pop" and then she had some bleeding from the incision superior to the port.  It has stopped but if you push on the area it will start to bleed again but only oozing a small amount of blood.  She otherwise says that she feels fine.  She had a bit of a cough recently where she coughed up something that tasted foul, but otherwise she is not having persistent cough, shortness of breath, fever or chills, nausea, vomiting, nor abdominal pain.  She has not felt particularly weak and has not have any episodes of lightheadedness.     Physical Exam   Triage Vital Signs: ED Triage Vitals [11/24/22 1502]  Encounter Vitals Group     BP 125/84     Systolic BP Percentile      Diastolic BP Percentile      Pulse Rate (!) 111     Resp 18     Temp 98 F (36.7 C)     Temp src      SpO2 100 %     Weight      Height      Head Circumference      Peak Flow      Pain Score 0     Pain Loc      Pain Education      Exclude from Growth Chart     Most recent vital signs: Vitals:   11/24/22 1502  BP: 125/84  Pulse: (!) 111  Resp: 18  Temp: 98 F (36.7 C)  SpO2: 100%    General: Awake, no distress.  Well-appearing despite her chronic medical issues and ongoing treatment. CV:  Good peripheral perfusion.  Tachycardia with  regular rhythm.  Normal heart sounds. Resp:  Normal effort. Speaking easily and comfortably, no accessory muscle usage nor intercostal retractions.  Lungs are clear to auscultation bilaterally. Abd:  No distention.  No tenderness to palpation of the abdomen. Other:  Mood and affect are normal and appropriate and patient is very well-appearing.  The Port-A-Cath on the right is also well-appearing with no sign of surrounding cellulitis or infection.  There is a small approximately 1 cm horizontal skin disruption that was likely the surgical incision which is slightly opened and will ooze a small amount of blood if palpated.  However it is not fluctuant, the discharge is nonpurulent, and there is no suggestion of abscess or infection around the wound.   ED Results / Procedures / Treatments   Labs (all labs ordered are listed, but only abnormal results are displayed) Labs Reviewed  COMPREHENSIVE METABOLIC PANEL - Abnormal; Notable for the following components:      Result Value   Glucose, Bld 111 (*)    Calcium  8.8 (*)    All other components within normal limits  CBC WITH DIFFERENTIAL/PLATELET - Abnormal; Notable for the following components:   Hemoglobin 9.6 (*)    HCT 31.6 (*)    MCV 79.6 (*)    MCH 24.2 (*)    Platelets 112 (*)    All other components within normal limits  CULTURE, BLOOD (ROUTINE X 2)  CULTURE, BLOOD (ROUTINE X 2)  LACTIC ACID, PLASMA  PROTIME-INR  PROCALCITONIN  POC URINE PREG, ED     RADIOLOGY I viewed and interpreted the patient's two-view chest x-ray and there is some mild opacity on the right.  The radiologist expressed concern that it represents developing pneumonia in the right lower lobe.   PROCEDURES:  Critical Care performed: No  Procedures    IMPRESSION / MDM / ASSESSMENT AND PLAN / ED COURSE  I reviewed the triage vital signs and the nursing notes.                              Differential diagnosis includes, but is not limited to,  pneumonia, port failure, infected port site.  Patient's presentation is most consistent with acute presentation with potential threat to life or bodily function.  Labs/studies ordered: Two-view chest x-ray, lactic acid, blood cultures, comprehensive metabolic panel, CBC with differential, procalcitonin, pro time-INR given the bleeding around the port.  Interventions/Medications given:  Medications  cefTRIAXone (ROCEPHIN) 2 g in sodium chloride 0.9 % 100 mL IVPB (has no administration in time range)  azithromycin (ZITHROMAX) 500 mg in sodium chloride 0.9 % 250 mL IVPB (has no administration in time range)  lactated ringers bolus 1,000 mL (1,000 mLs Intravenous New Bag/Given 11/24/22 2014)  iohexol (OMNIPAQUE) 350 MG/ML injection 75 mL (75 mLs Intravenous Contrast Given 11/24/22 2034)    (Note:  hospital course my include additional interventions and/or labs/studies not listed above.)   Patient is tachycardic in the 110s without a good explanation.  Additionally, as document above, there is concern for developing pneumonia on the chest x-ray, which is particular concerning given her immunocompromise state.  I will check with surgery to see if they recommend specific imaging for the port but I am overall reassured by the port site, I am more concerned about the possibility of developing pneumonia/sepsis.  I ordered 1 L LR IV bolus to begin to address the tachycardia.  A phone consultation with medical oncology will be necessary once all of the data has been obtained.  I discussed this plan with the patient and 2 family members and her pastor, all of whom are at bedside, and they all agreed with the plan.     Clinical Course as of 11/24/22 2100  Wynelle Link Nov 24, 2022  8295 I consulted by phone with Dr. Maurine Minister with surgery after I verified that about a month ago Dr. Aleen Campi placed the Port-A-Cath for her treatment.  I ask about imaging modalities if I were to proceed with imaging to rule out  pneumonia, should I get a specific chest CT modalities such as with IV contrast to further assess the patency and safety of the Port-A-Cath.  He said that he would recommend no specific imaging just for the cath issue and that we should see what we need to see if we get imaging of her chest.  I think that is very reasonable and we will await the results of her lab test before ordering a CT but for best imaging  results I will likely recommend a CT chest with IV contrast. [CF]  1930 Transferring ED care to [facility], [place].  The current plan is to obtain lab work as previously described to verify kidney function, and check for signs of infection/sepsis.  Once the kidney function has been verified, an order will be placed for a CTA chest assuming that the patient's kidney function is appropriate; this will evaluate for the possibility of pneumonia as well as rule out pulmonary infarction due to pulmonary embolism as a cause for the abnormal and nondiagnostic findings on the x-ray.  Ms. Joseph Art will follow-up on the lab results and order the imaging study as appropriate.  I am holding off on empiric antibiotics because I do not want to inappropriately treat her with broad-spectrum antibiotics if her tachycardia is due to another cause such as pulmonary infarction. [CF]    Clinical Course User Index [CF] Loleta Rose, MD     FINAL CLINICAL IMPRESSION(S) / ED DIAGNOSES   Final diagnoses:  Sinus tachycardia  Problem with vascular access  Immunosuppressed status (HCC)     Rx / DC Orders   ED Discharge Orders     None        Note:  This document was prepared using Dragon voice recognition software and may include unintentional dictation errors.   Loleta Rose, MD 11/24/22 2100

## 2022-11-24 NOTE — Assessment & Plan Note (Signed)
On neoadjuvant chemotherapy, last infusion 11/22/2022 Heme-onc with patient in house, but ED provider

## 2022-11-24 NOTE — Assessment & Plan Note (Addendum)
Patient reports intermittent bleeding from port No obvious problem on CT chest: "Right chest port catheter tip ends in the right atrium" Surgical consult to assess Monitor H&H

## 2022-11-24 NOTE — Progress Notes (Signed)
PHARMACY - ANTICOAGULATION CONSULT NOTE  Pharmacy Consult for Heparin  Indication: pulmonary embolus  No Known Allergies  Patient Measurements: Height: 5' (152.4 cm) Weight: 64 kg (141 lb 1.6 oz) IBW/kg (Calculated) : 45.5 Heparin Dosing Weight: 59 kg   Vital Signs: Temp: 98.1 F (36.7 C) (10/13 2202) Temp Source: Oral (10/13 2202) BP: 124/87 (10/13 2202) Pulse Rate: 95 (10/13 2202)  Labs: Recent Labs    11/22/22 0907 11/24/22 1759 11/24/22 2159  HGB 10.0* 9.6*  --   HCT 32.5* 31.6*  --   PLT 115* 112*  --   APTT  --   --  28  LABPROT  --  14.3  --   INR  --  1.1  --   CREATININE 0.53 0.44  --     Estimated Creatinine Clearance: 81.2 mL/min (by C-G formula based on SCr of 0.44 mg/dL).   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  No prior anticoag noted. CrCl = 81.2 ml/min  Goal of Therapy:  Heparin level 0.3-0.7 units/ml Monitor platelets by anticoagulation protocol: Yes   Plan:  Give 4000 units bolus x 1 Start heparin infusion at 1000 units/hr Check anti-Xa level in 6 hours and daily while on heparin Continue to monitor H&H and platelets  Laytoya Ion D 11/24/2022,10:25 PM

## 2022-11-24 NOTE — ED Triage Notes (Signed)
Pt comes with c/o port problem. Pt states this morning she was taking a shower and her port popped and  then started to bleed some. Bleeding controlled now. Pt states that on Friday when she had her treatment there was a scab over it. Pt states she got port month ago.

## 2022-11-24 NOTE — Assessment & Plan Note (Signed)
Immunosuppressed due to chemotherapy CTA showing multifocal pneumonia COVID-negative Follow blood cultures Continue Rocephin and azithromycin Antitussives, incentive spirometer, DuoNebs as needed Supplemental oxygen if needed

## 2022-11-25 ENCOUNTER — Other Ambulatory Visit (HOSPITAL_COMMUNITY): Payer: Self-pay

## 2022-11-25 ENCOUNTER — Encounter: Payer: Self-pay | Admitting: Internal Medicine

## 2022-11-25 ENCOUNTER — Inpatient Hospital Stay: Payer: Medicaid Other

## 2022-11-25 ENCOUNTER — Encounter: Payer: Self-pay | Admitting: Oncology

## 2022-11-25 DIAGNOSIS — I2699 Other pulmonary embolism without acute cor pulmonale: Secondary | ICD-10-CM | POA: Diagnosis not present

## 2022-11-25 LAB — SARS CORONAVIRUS 2 BY RT PCR: SARS Coronavirus 2 by RT PCR: NEGATIVE

## 2022-11-25 LAB — HCG, QUANTITATIVE, PREGNANCY: hCG, Beta Chain, Quant, S: 1 m[IU]/mL (ref <5)

## 2022-11-25 LAB — HEPARIN LEVEL (UNFRACTIONATED)
Heparin Unfractionated: 0.26 [IU]/mL — ABNORMAL LOW (ref 0.30–0.70)
Heparin Unfractionated: 0.16 [IU]/mL — ABNORMAL LOW (ref 0.30–0.70)

## 2022-11-25 LAB — HIV ANTIBODY (ROUTINE TESTING W REFLEX): HIV Screen 4th Generation wRfx: NONREACTIVE

## 2022-11-25 MED ORDER — SODIUM CHLORIDE 0.9 % IV SOLN: 2.0000 g | INTRAVENOUS | Status: DC

## 2022-11-25 MED ORDER — HEPARIN BOLUS VIA INFUSION
900.0000 [IU] | Freq: Once | INTRAVENOUS | Status: AC
  Administered 2022-11-25: 900 [IU] via INTRAVENOUS
  Filled 2022-11-25: qty 900

## 2022-11-25 MED ORDER — APIXABAN (ELIQUIS) VTE STARTER PACK (10MG AND 5MG): ORAL_TABLET | ORAL | 0 refills | Status: DC

## 2022-11-25 MED ORDER — SODIUM CHLORIDE 0.9 % IV SOLN: 500.0000 mg | INTRAVENOUS | Status: DC

## 2022-11-25 MED ORDER — HEPARIN SOD (PORK) LOCK FLUSH 100 UNIT/ML IV SOLN
500.0000 [IU] | Freq: Once | INTRAVENOUS | Status: AC
  Administered 2022-11-25: 500 [IU] via INTRAVENOUS
  Filled 2022-11-25: qty 5

## 2022-11-25 MED ORDER — APIXABAN 5 MG PO TABS
10.0000 mg | ORAL_TABLET | Freq: Once | ORAL | Status: AC
  Administered 2022-11-25: 10 mg via ORAL
  Filled 2022-11-25: qty 2

## 2022-11-25 MED ORDER — APIXABAN 5 MG PO TABS: 5.0000 mg | ORAL_TABLET | Freq: Two times a day (BID) | ORAL | 1 refills | Status: DC

## 2022-11-25 NOTE — Consult Note (Signed)
Hospital Consult    Reason for Consult:  Pulmonary Embolism Requesting Physician:  Dr. Lindajo Royal MD MRN #:  [id number]  History of Present Illness: This is a 37 y.o. 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  She denied pain in the area, fever or chills but did endorses a recent bout of coughing in which she coughed up something that tasted foul.  The coughing has resolved and she denies shortness of breath, fever or chills, nausea, vomiting or abdominal pain.  Endorses feeling at her baseline.   On exam this morning patient is resting comfortably in bed.  She is not wearing any supplemental oxygen.  Patient endorses she has been walking around the halls back and forth to the bathroom without any shortness of breath.  This morning she is more concerned about her port.  General surgery has been consulted to come see her on that.  Vascular surgery was consulted to evaluate for pulmonary embolism.  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    Past Surgical History:  Procedure Laterality Date   BREAST BIOPSY Right 05/20/2019   Affirm Bx- X- clip, neg   BREAST BIOPSY Left 10/02/2022   [place] Bx, path pending   BREAST BIOPSY Left 10/02/2022   [place] Bx Node- path pending   BREAST BIOPSY Left 10/02/2022   [place] LT BREAST BX W LOC DEV 1ST LESION IMG BX SPEC US GUIDE 10/02/2022 [facility]   CESAREAN SECTION     LAPAROSCOPIC BILATERAL SALPINGECTOMY Bilateral 02/09/2022   Procedure: LAPAROSCOPIC BILATERAL SALPINGECTOMY;  Surgeon: Hildred Laser, MD;  Location: [facility];  Service: Gynecology;   Laterality: Bilateral;   PORTACATH PLACEMENT Right 10/17/2022   Procedure: INSERTION PORT-A-CATH;  Surgeon: Henrene Dodge, MD;  Location: [facility];  Service: General;  Laterality: Right;   TUBAL LIGATION      No Known Allergies  Prior to Admission medications   Medication Sig Start Date End Date Taking? Authorizing Provider  acetaminophen (TYLENOL) 500 MG tablet Take 2 tablets (1,000 mg total) by mouth every 6 (six) hours as needed for mild pain. 10/17/22  Yes Piscoya, Elita Quick, MD  Calcium Carbonate-Vitamin D (CALCIUM 600 +D HIGH POTENCY) 600-10 MG-MCG TABS Take 1 tablet by mouth 2 (two) times daily. 11/15/22  Yes [name], MD  senna (SENOKOT) 8.6 MG TABS tablet Take 2 tablets (17.2 mg total) by mouth daily. Patient taking differently: Take 2 tablets by mouth daily as needed. 11/15/22  Yes [name], MD  lidocaine-prilocaine (EMLA) cream Apply 1 Application topically as needed. Patient not taking: Reported on 11/15/2022 10/21/22   [name], MD  LORazepam (ATIVAN) 0.5 MG tablet Take 1 tablet (0.5 mg total) by mouth every 8 (eight) hours as needed for anxiety (nausea or vomiting). Patient not taking: Reported on 10/29/2022 10/25/22   [name], MD  ondansetron (ZOFRAN) 8 MG tablet Take 1 tablet (8 mg total) by mouth See admin instructions. If needed, start on Day 3 of chemotherapy, take 1 tablet every 8 hours as needed for nausea or vomiting. Patient not taking: Reported on 10/25/2022 10/21/22  [name], MD  prochlorperazine (COMPAZINE) 10 MG tablet Take 1 tablet (10 mg total) by mouth every 6 (six) hours as needed for nausea or vomiting. Patient not taking: Reported on 10/25/2022 10/21/22   [name], MD    Social History   Socioeconomic History   Marital status: Single    Spouse name: Not on file   Number of children: 2   Years of education: Not on file   Highest education level: High school graduate  Occupational History   Not on file  Tobacco Use   Smoking status: Never   Smokeless tobacco: Never   Vaping Use   Vaping status: Never Used  Substance and Sexual Activity   Alcohol use: Yes    Comment: occ   Drug use: No   Sexual activity: Yes    Birth control/protection: Surgical  Other Topics Concern   Not on file  Social History Narrative   Not on file   Social Determinants of Health   Financial Resource Strain: Low Risk  (10/10/2022)   Overall Financial Resource Strain (CARDIA)    Difficulty of Paying Living Expenses: Not very hard  Food Insecurity: No Food Insecurity (11/25/2022)   Hunger Vital Sign    Worried About Running Out of Food in the Last Year: Never true    Ran Out of Food in the Last Year: Never true  Transportation Needs: No Transportation Needs (11/25/2022)   PRAPARE - Administrator, Civil Service (Medical): No    Lack of Transportation (Non-Medical): No  Physical Activity: Not on file  Stress: No Stress Concern Present (10/10/2022)   [employer] of Occupational Health - Occupational Stress Questionnaire    Feeling of Stress : Only a little  Social Connections: Not on file  Intimate Partner Violence: Not At Risk (11/25/2022)   Humiliation, Afraid, Rape, and Kick questionnaire    Fear of Current or Ex-Partner: No    Emotionally Abused: No    Physically Abused: No    Sexually Abused: No     Family History  Problem Relation Age of Onset   Breast cancer Mother 64       no GT   Breast cancer Maternal Aunt 33   Breast cancer Maternal Aunt    Lung cancer Maternal Uncle    Lung cancer Maternal Uncle    Bone cancer Paternal Aunt    Pancreatic cancer Maternal Grandmother 63   Lung cancer Maternal Grandfather 63   Breast cancer Cousin 40   Breast cancer Other 33       gene pos?    ROS: Otherwise negative unless mentioned in HPI  Physical Examination  Vitals:   11/25/22 0451 11/25/22 0650  BP: 100/62 108/67  Pulse:  89  Resp: 15 17  Temp:  98.4 F (36.9 C)  SpO2:  100%   Body mass index is 27.56 kg/m.  General:  WDWN in  NAD Gait: Not observed HENT: WNL, normocephalic Pulmonary: normal non-labored breathing, without Rales, rhonchi,  wheezing Cardiac: regular, without  Murmurs, rubs or gallops; without carotid bruits Abdomen: Positive Bowel Sounds, soft, NT/ND, no masses Skin: without rashes Vascular Exam/Pulses: Palpable Pulses throughout. Left calf pain on palpation. Extremities: without ischemic changes, without Gangrene , without cellulitis; without open wounds;  Musculoskeletal: no muscle wasting or atrophy  Neurologic: A&O X 3;  No focal weakness or paresthesias are detected; speech is fluent/normal Psychiatric:  The pt has Normal affect. Lymph:  Unremarkable  CBC    Component Value  Date/Time   WBC 4.8 11/24/2022 1759   RBC 3.97 11/24/2022 1759   HGB 9.6 (L) 11/24/2022 1759   HGB 10.0 (L) 11/22/2022 0907   HGB 11.3 (L) 02/06/2012 1022   HCT 31.6 (L) 11/24/2022 1759   HCT 34.1 (L) 02/08/2012 0514   PLT 112 (L) 11/24/2022 1759   PLT 115 (L) 11/22/2022 0907   PLT 169 02/06/2012 1022   MCV 79.6 (L) 11/24/2022 1759   MCV 88 02/06/2012 1022   MCH 24.2 (L) 11/24/2022 1759   MCHC 30.4 11/24/2022 1759   RDW NOT MEASURED 11/24/2022 1759   RDW 17.4 (H) 02/06/2012 1022   LYMPHSABS 1.6 11/24/2022 1759   LYMPHSABS 1.4 02/06/2012 1022   MONOABS 0.3 11/24/2022 1759   MONOABS 0.7 02/06/2012 1022   EOSABS 0.0 11/24/2022 1759   EOSABS 0.1 02/06/2012 1022   BASOSABS 0.0 11/24/2022 1759   BASOSABS 0.0 02/06/2012 1022    BMET    Component Value Date/Time   NA 135 11/24/2022 1759   NA 138 12/04/2011 1032   K 3.6 11/24/2022 1759   K 3.8 12/04/2011 1032   CL 102 11/24/2022 1759   CL 106 12/04/2011 1032   CO2 23 11/24/2022 1759   CO2 24 12/04/2011 1032   GLUCOSE 111 (H) 11/24/2022 1759   GLUCOSE 77 12/04/2011 1032   BUN 10 11/24/2022 1759   BUN 5 (L) 12/04/2011 1032   CREATININE 0.44 11/24/2022 1759   CREATININE 0.53 11/22/2022 0907   CREATININE 0.38 (L) 12/04/2011 1032   CALCIUM 8.8 (L)  11/24/2022 1759   CALCIUM 8.5 12/04/2011 1032   GFRNONAA >60 11/24/2022 1759   GFRNONAA >60 11/22/2022 0907   GFRNONAA >60 12/04/2011 1032   GFRAA >60 12/04/2011 1032    COAGS: Lab Results  Component Value Date   INR 1.1 11/24/2022     Non-Invasive Vascular Imaging:   Bilateral Lower extremity DVT Ultrasounds Ordered.   Statin:  No. Beta Blocker:  No. Aspirin:  No. ACEI:  No. ARB:  No. CCB use:  No Other antiplatelets/anticoagulants:  No.    ASSESSMENT/PLAN: This is a 37 y.o. female with a significant history for invasive left breast cancer diagnosed back in August 2024 with a port placement by surgery on 9 5.  She is currently on neoadjuvant chemotherapy and presented to the ED for concerns of intermittent bleeding from her port as well as a cough.  Upon workup in the ED she was found to have a pulmonary embolism without right heart strain.  I had a detailed discussion with the patient she is asymptomatic at this time.  She feels like she is at her baseline.  She endorses that she does not really want to have a procedure that would put her at risk due to her immunotherapy and her high risk for infection.  Vascular surgery at this time due to the patient being completely asymptomatic and agrees with that plan.  At this time the patient does not need any vascular intervention.   -I discussed in detail the plan with Dr. Festus Barren MD and he agrees with the plan.   Marcie Bal [facility] 11/25/2022 8:39 AM

## 2022-11-25 NOTE — TOC Benefit Eligibility Note (Signed)
Patient Product/process development scientist completed.    The patient is insured through [facility].     Ran test claim for Eliquis 5 mg and the current 30 day co-pay is $4.00.   This test claim was processed through [facility]- copay amounts may vary at other pharmacies due to pharmacy/plan contracts, or as the patient moves through the different stages of their insurance plan.     Roland Earl, CPHT Pharmacy Technician III Certified Patient Advocate [facility] Patient Advocate Team Direct Number: [phone]  Fax: [phone]

## 2022-11-25 NOTE — Discharge Summary (Signed)
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  She denied pain in the area, fever or chills but did endorses a recent bout of coughing in which she coughed up something that tasted foul.  The coughing has resolved and she denies shortness of breath, fever or chills, nausea, vomiting or abdominal pain.  Endorses feeling at her baseline. During initial assessment in the ED she was found to have unexplained tachycardia 111 and workup was pursued in view of her immuno suppressed state.  Hospital Course:  Patient presented because of some bleeding at her port site. The port was evaluated by general surgery who say the site appears normal, nothing to do. Patient also found to be tachycardic and w/u of this revealed new bilateral PE. Patient does endorse two days of mild shortness of breath but otherwise is asymptomatic. No evidence of right heart strain on CT and no clinical signs of such. PVLs negative for DVT. CT also with  patchy ground-glass airspace opacities suggestive of pneumonia but patient afebrile, denies cough, no elevated WBCs, covid negative, RVP pending. Was started on abx but shared decision to hold abx at discharge and monitor symptoms. Should she develop symptoms of pneumonia then certainly would treat. For the PE she was started on heparin and this was transitioned to apixaban and will discharge on that. She is not pregnant and there is no personal or family history of abnormal bleeding. She has oncology f/u later this week - message sent to her oncologist.  Procedures: none   Consultations: vascular  Discharge Exam: Vitals:   11/25/22 0650 11/25/22 1036  BP: 108/67 108/73  Pulse: 89 82  Resp: 17 16  Temp: 98.4 F (36.9 C) 98.6 F (37 C)  SpO2: 100% 100%    General: NAD Cardiovascular: RRR Respiratory: CTAB Ext: warm, no tenderness or swelling  Discharge Instructions   Discharge Instructions     Diet general   Complete by: As directed    Increase activity slowly   Complete by: As directed       Allergies as of 11/25/2022   No Known Allergies      Medication List     TAKE these medications    acetaminophen 500 MG tablet Commonly known as: TYLENOL Take 2 tablets (1,000 mg total) by mouth every 6 (six) hours as needed for mild pain.   Apixaban Starter Pack (10mg  and 5mg ) Commonly known as: ELIQUIS STARTER PACK Take as directed on package: start with two-5mg  tablets twice daily for 7 days. On day 8, switch  to one-5mg  tablet twice daily.   apixaban 5 MG Tabs tablet Commonly known as: ELIQUIS Take 1 tablet (5 mg total) by mouth 2 (two) times daily. Start AFTER completing your starter pack   Calcium 600 +D High Potency 600-10 MG-MCG Tabs Generic drug: Calcium Carbonate-Vitamin D Take 1 tablet by mouth 2 (two) times daily.   lidocaine-prilocaine cream Commonly known as: EMLA Apply 1 Application topically as needed.   LORazepam 0.5 MG tablet Commonly known as:  ATIVAN Take 1 tablet (0.5 mg total) by mouth every 8 (eight) hours as needed for anxiety (nausea or vomiting).   ondansetron 8 MG tablet Commonly known as: ZOFRAN Take 1 tablet (8 mg total) by mouth See admin instructions. If needed, start on Day 3 of chemotherapy, take 1 tablet every 8 hours as needed for nausea or vomiting.   prochlorperazine 10 MG tablet Commonly known as: COMPAZINE Take 1 tablet (10 mg total) by mouth every 6 (six) hours as needed for nausea or vomiting.   senna 8.6 MG Tabs tablet Commonly known as: SENOKOT Take 2 tablets (17.2 mg total) by mouth daily. What changed:  when to take this reasons to take this       No Known Allergies  Follow-up Information     [name], MD Follow up.   Specialty: Oncology Why: as scheduled Contact information: [address] [place] [address] [phone]         Shane Crutch, [place] Follow up.   Specialty: Family Medicine Contact information: [address] [place] [address] [phone]                  The results of significant diagnostics from this hospitalization (including imaging, microbiology, ancillary and laboratory) are listed below for reference.    Significant Diagnostic Studies: US Venous Img Lower Bilateral (DVT)  Result Date: 11/25/2022 CLINICAL DATA:  37 year old female with history of pulmonary embolism EXAM: BILATERAL LOWER EXTREMITY VENOUS DOPPLER ULTRASOUND TECHNIQUE: Gray-scale sonography with graded compression, as well as color Doppler and duplex ultrasound were performed to evaluate the lower extremity deep venous systems from the level of the common femoral vein and including the common femoral, femoral, profunda femoral, popliteal and calf veins including the posterior tibial, peroneal and gastrocnemius veins when visible. The superficial great saphenous vein was also interrogated. Spectral Doppler was utilized to evaluate flow at rest and with distal augmentation  maneuvers in the common femoral, femoral and popliteal veins. COMPARISON:  None Available. FINDINGS: RIGHT LOWER EXTREMITY Common Femoral Vein: No evidence of thrombus. Normal compressibility, respiratory phasicity and response to augmentation. Saphenofemoral Junction: No evidence of thrombus. Normal compressibility and flow on color Doppler imaging. Profunda Femoral Vein: No evidence of thrombus. Normal compressibility and flow on color Doppler imaging. Femoral Vein: No evidence of thrombus. Normal compressibility, respiratory phasicity and response to augmentation. Popliteal Vein: No evidence of thrombus. Normal compressibility, respiratory phasicity and response to augmentation. Calf Veins: No evidence of thrombus. Normal compressibility and flow on color Doppler imaging. Superficial Great Saphenous Vein: No evidence of thrombus. Normal compressibility and flow on color Doppler imaging. Other Findings:  None. LEFT LOWER EXTREMITY Common Femoral Vein: No evidence of thrombus. Normal compressibility, respiratory phasicity and response to augmentation. Saphenofemoral Junction: No evidence of thrombus. Normal compressibility and flow on color Doppler imaging. Profunda Femoral Vein: No evidence of thrombus. Normal compressibility and flow on color Doppler imaging. Femoral Vein: No evidence of thrombus. Normal compressibility, respiratory phasicity and response to augmentation. Popliteal Vein: No  evidence of thrombus. Normal compressibility, respiratory phasicity and response to augmentation. Calf Veins: No evidence of thrombus. Normal compressibility and flow on color Doppler imaging. Superficial Great Saphenous Vein: No evidence of thrombus. Normal compressibility and flow on color Doppler imaging. Other Findings:  None. IMPRESSION: Directed duplex of the bilateral lower extremities negative for DVT Signed, Yvone Neu. Miachel Roux, RPVI [facility]  Electronically Signed   By: Gilmer Mor D.O.   On: 11/25/2022 09:54   CT Angio Chest PE W and/or Wo Contrast  Result Date: 11/24/2022 CLINICAL DATA:  High probability for PE. EXAM: CT ANGIOGRAPHY CHEST WITH CONTRAST TECHNIQUE: Multidetector CT imaging of the chest was performed using the standard protocol during bolus administration of intravenous contrast. Multiplanar CT image reconstructions and MIPs were obtained to evaluate the vascular anatomy. RADIATION DOSE REDUCTION: This exam was performed according to the departmental dose-optimization program which includes automated exposure control, adjustment of the mA and/or kV according to patient size and/or use of iterative reconstruction technique. CONTRAST:  75mL OMNIPAQUE IOHEXOL 350 MG/ML SOLN COMPARISON:  None Available. FINDINGS: Cardiovascular: Pulmonary emboli are seen within the distal right main pulmonary artery extending into segmental branches of the right lower lobe and right middle lobe. Heart is normal in size. Aorta is normal in size. Right chest port catheter tip ends in the right atrium. There is no pericardial effusion. Mediastinum/Nodes: No enlarged mediastinal, hilar, or axillary lymph nodes. Thyroid gland, trachea, and esophagus demonstrate no significant findings. Lungs/Pleura: Multifocal patchy ground-glass and airspace opacities are seen in the right middle lobe and right lower lobe. There is a small peripheral opacity in the posterior right lower lobe which may represent pulmonary infarct. There is no pleural effusion or pneumothorax. Upper Abdomen: No acute abnormality. Musculoskeletal: No chest wall abnormality. No acute or significant osseous findings. Review of the MIP images confirms the above findings. IMPRESSION: 1. Pulmonary emboli within the distal right main pulmonary artery extending into segmental branches of the right lower lobe and right middle lobe. No evidence for right heart strain. 2. Multifocal patchy ground-glass  and airspace opacities in the right middle lobe and right lower lobe worrisome for multifocal pneumonia. 3. Small peripheral opacity in the posterior right lower lobe may represent pulmonary infarct. Electronically Signed   By: Darliss Cheney M.D.   On: 11/24/2022 20:49   DG Chest 2 View  Result Date: 11/24/2022 CLINICAL DATA:  Bleeding from port site. EXAM: CHEST - 2 VIEW COMPARISON:  Chest x-ray dated October 17, 2022. FINDINGS: Right chest wall port catheter in unchanged position with the tip in the proximal SVC. The heart size and mediastinal contours are within normal limits. Normal pulmonary vascularity. New patchy opacities in the right lower lobe. Clear left lung. No pleural effusion or pneumothorax. No acute osseous abnormality. IMPRESSION: 1. Unchanged position of the right chest wall port catheter. 2. New right lower lobe pneumonia. Electronically Signed   By: Obie Dredge M.D.   On: 11/24/2022 16:41    Microbiology: Recent Results (from the past 240 hour(s))  Blood Culture (routine x 2)     Status: None (Preliminary result)   Collection Time: 11/24/22  6:02 PM   Specimen: BLOOD  Result Value Ref Range Status   Specimen Description BLOOD BLOOD RIGHT ARM  Final   Special Requests   Final    BOTTLES DRAWN AEROBIC AND ANAEROBIC Blood Culture results may not be optimal due to an excessive volume of blood received in culture  bottles   Culture   Final    NO GROWTH < 12 HOURS Performed at [facility], [address] [place], [place] [address]    Report Status PENDING  Incomplete  Blood Culture (routine x 2)     Status: None (Preliminary result)   Collection Time: 11/24/22  9:56 PM   Specimen: BLOOD LEFT ARM  Result Value Ref Range Status   Specimen Description BLOOD LEFT ARM  Final   Special Requests   Final    BOTTLES DRAWN AEROBIC AND ANAEROBIC Blood Culture adequate volume   Culture   Final    NO GROWTH < 12 HOURS Performed at [facility], [address] [place], [place] [address]    Report Status PENDING  Incomplete  SARS Coronavirus 2 by RT PCR (hospital order, performed in [facility] hospital lab) *cepheid single result test*     Status: None   Collection Time: 11/25/22  9:40 AM  Result Value Ref Range Status   SARS Coronavirus 2 by RT PCR NEGATIVE NEGATIVE Final    Comment: (NOTE) SARS-CoV-2 target nucleic acids are NOT DETECTED.  The SARS-CoV-2 RNA is generally detectable in upper and lower respiratory specimens during the acute phase of infection. The lowest concentration of SARS-CoV-2 viral copies this assay can detect is 250 copies / mL. A negative result does not preclude SARS-CoV-2 infection and should not be used as the sole basis for treatment or other patient management decisions.  A negative result may occur with improper specimen collection / handling, submission of specimen other than nasopharyngeal swab, presence of viral mutation(s) within the areas targeted by this assay, and inadequate number of viral copies (<250 copies / mL). A negative result must be combined with clinical observations, patient history, and epidemiological information.  Fact Sheet for Patients:   RoadLapTop.co.za  Fact Sheet for Healthcare Providers: http://kim-miller.com/  This test is not yet approved or  cleared by the [place] FDA and has been authorized for detection and/or diagnosis of SARS-CoV-2 by FDA under an Emergency Use Authorization (EUA).  This EUA will remain in effect (meaning this test can be used) for the duration of the COVID-19 declaration under Section 564(b)(1) of the Act, 21 U.S.C. section 360bbb-3(b)(1), unless the authorization is terminated or revoked sooner.  Performed at [facility], [address] [place], [place] [address]      Labs: Basic Metabolic Panel: Recent Labs  Lab 11/22/22 0907 11/24/22 1759  NA 136 135  K 3.7 3.6  CL 106 102   CO2 26 23  GLUCOSE 79 111*  BUN 12 10  CREATININE 0.53 0.44  CALCIUM 8.8* 8.8*   Liver Function Tests: Recent Labs  Lab 11/22/22 0907 11/24/22 1759  AST 19 20  ALT 25 23  ALKPHOS 71 67  BILITOT 0.4 0.5  PROT 7.3 7.6  ALBUMIN 3.9 3.9   No results for input(s): "LIPASE", "AMYLASE" in the last 168 hours. No results for input(s): "AMMONIA" in the last 168 hours. CBC: Recent Labs  Lab 11/22/22 0907 11/24/22 1759  WBC 3.4* 4.8  NEUTROABS 1.8 2.9  HGB 10.0* 9.6*  HCT 32.5* 31.6*  MCV 79.1* 79.6*  PLT 115* 112*   Cardiac Enzymes: No results for input(s): "CKTOTAL", "CKMB", "CKMBINDEX", "TROPONINI" in the last 168 hours. BNP: BNP (last 3 results) No results for input(s): "BNP" in the last 8760 hours.  ProBNP (last 3 results) No results for input(s): "PROBNP" in the last 8760 hours.  CBG: No results  for input(s): "GLUCAP" in the last 168 hours.     Signed:  Silvano Bilis MD.  [facility] 11/25/2022, 2:31 PM

## 2022-11-25 NOTE — Consult Note (Signed)
[facility] SURGICAL CONSULTATION NOTE (initial) - cpt: 82956   HISTORY OF PRESENT ILLNESS (HPI):  37 y.o. female presented to [facility] ED yesterday for evaluation of vascular access problems. Patient known to our service following port-a-catheter placement on 09/05 with Dr Aleen Campi for history of left breast cancer. She has been utilizing port for chemotherapy without issue. However, she reports that last night she was in the shower, and felt a "pop" and reported bleeding from the incision itself after. This stopped on its own. No fever, chills, erythema, or increased pain in this area. She continues to be able to access port without issue. The wound itself has not dehisced. No other acute complaints. In the ED, her WBC was normal at 4.8K, Hgb to 9.6 which is stable, renal function normal with sCr - 0.44, no electrolyte derangements. Blood cultures were taken and without growth x2. She did get a CTA Chest and this was concerning for PE. Port was in adequate position. She was admitted ot the medicine service given this PE.   Surgery is consulted by emergency medicine physician Dr. Loleta Rose, MD in this context for evaluation and management of port issue.  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     PAST SURGICAL HISTORY (PSH):  Past Surgical History:  Procedure Laterality Date   BREAST BIOPSY Right 05/20/2019   Affirm Bx- X- clip, neg   BREAST BIOPSY Left 10/02/2022   [place] Bx, path pending   BREAST BIOPSY Left 10/02/2022   [place] Bx Node- path pending   BREAST BIOPSY Left 10/02/2022   [place] LT BREAST BX W LOC DEV 1ST LESION IMG BX SPEC US GUIDE 10/02/2022 [facility]   CESAREAN SECTION     LAPAROSCOPIC BILATERAL SALPINGECTOMY Bilateral 02/09/2022   Procedure: LAPAROSCOPIC BILATERAL  SALPINGECTOMY;  Surgeon: Hildred Laser, MD;  Location: [facility] ORS;  Service: Gynecology;  Laterality: Bilateral;   PORTACATH PLACEMENT Right 10/17/2022   Procedure: INSERTION PORT-A-CATH;  Surgeon: Henrene Dodge, MD;  Location: [facility] ORS;  Service: General;  Laterality: Right;   TUBAL LIGATION       MEDICATIONS:  Prior to Admission medications   Medication Sig Start Date End Date Taking? Authorizing Provider  acetaminophen (TYLENOL) 500 MG tablet Take 2 tablets (1,000 mg total) by mouth every 6 (six) hours as needed for mild pain. 10/17/22  Yes Piscoya, Elita Quick, MD  Calcium Carbonate-Vitamin D (CALCIUM 600 +D HIGH POTENCY) 600-10 MG-MCG TABS Take 1 tablet by mouth 2 (two) times daily. 11/15/22  Yes [name], MD  senna (SENOKOT) 8.6 MG TABS tablet Take 2 tablets (17.2 mg total) by mouth daily. Patient taking differently: Take 2 tablets by mouth daily as needed. 11/15/22  Yes [name], MD  lidocaine-prilocaine (EMLA) cream Apply 1 Application topically as needed. Patient not taking: Reported on 11/15/2022 10/21/22   [name], MD  LORazepam (ATIVAN) 0.5 MG tablet Take 1 tablet (0.5 mg total) by mouth every 8 (eight) hours as needed for anxiety (nausea or vomiting). Patient not taking: Reported on 10/29/2022 10/25/22   [name], MD  ondansetron (ZOFRAN) 8 MG tablet Take 1 tablet (8 mg total) by mouth See admin instructions. If needed, start on Day 3 of chemotherapy, take 1 tablet every 8 hours as needed for nausea  or vomiting. Patient not taking: Reported on 10/25/2022 10/21/22   [name], MD  prochlorperazine (COMPAZINE) 10 MG tablet Take 1 tablet (10 mg total) by mouth every 6 (six) hours as needed for nausea or vomiting. Patient not taking: Reported on 10/25/2022 10/21/22   [name], MD     ALLERGIES:  No Known Allergies   SOCIAL HISTORY:  Social History   Socioeconomic History   Marital status: Single    Spouse name: Not on file   Number of children: 2   Years of education: Not on file   Highest education  level: High school graduate  Occupational History   Not on file  Tobacco Use   Smoking status: Never   Smokeless tobacco: Never  Vaping Use   Vaping status: Never Used  Substance and Sexual Activity   Alcohol use: Yes    Comment: occ   Drug use: No   Sexual activity: Yes    Birth control/protection: Surgical  Other Topics Concern   Not on file  Social History Narrative   Not on file   Social Determinants of Health   Financial Resource Strain: Low Risk  (10/10/2022)   Overall Financial Resource Strain (CARDIA)    Difficulty of Paying Living Expenses: Not very hard  Food Insecurity: No Food Insecurity (10/10/2022)   Hunger Vital Sign    Worried About Running Out of Food in the Last Year: Never true    Ran Out of Food in the Last Year: Never true  Transportation Needs: No Transportation Needs (09/23/2022)   PRAPARE - Administrator, Civil Service (Medical): No    Lack of Transportation (Non-Medical): No  Physical Activity: Not on file  Stress: No Stress Concern Present (10/10/2022)   [employer] of Occupational Health - Occupational Stress Questionnaire    Feeling of Stress : Only a little  Social Connections: Not on file  Intimate Partner Violence: Not on file     FAMILY HISTORY:  Family History  Problem Relation Age of Onset   Breast cancer Mother 66       no GT   Breast cancer Maternal Aunt 33   Breast cancer Maternal Aunt    Lung cancer Maternal Uncle    Lung cancer Maternal Uncle    Bone cancer Paternal Aunt    Pancreatic cancer Maternal Grandmother 55   Lung cancer Maternal Grandfather 74   Breast cancer Cousin 40   Breast cancer Other 33       gene pos?      REVIEW OF SYSTEMS:  Review of Systems  Constitutional:  Negative for chills and fever.  Cardiovascular:  Negative for chest pain and palpitations.  Gastrointestinal:  Negative for abdominal pain, nausea and vomiting.  Skin:  Negative for itching and rash.       + Port in right  chest; ? Bleeding   All other systems reviewed and are negative.   VITAL SIGNS:  Temp:  [98 F (36.7 C)-98.4 F (36.9 C)] 98.4 F (36.9 C) (10/14 0650) Pulse Rate:  [81-111] 89 (10/14 0650) Resp:  [15-19] 17 (10/14 0650) BP: (100-125)/(62-87) 108/67 (10/14 0650) SpO2:  [99 %-100 %] 100 % (10/14 0650) Weight:  [64 kg] 64 kg (10/13 2202)     Height: 5' (152.4 cm) Weight: 64 kg BMI (Calculated): 27.56   INTAKE/OUTPUT:  10/13 0701 - 10/14 0700 In: 1350 [IV Piggyback:1350] Out: -   PHYSICAL EXAM:  Physical Exam Vitals and nursing note reviewed.  Constitutional:  General: She is not in acute distress.    Appearance: Normal appearance. She is normal weight. She is not ill-appearing.     Comments: Patient resting in bed; NAD  HENT:     Head: Normocephalic and atraumatic.  Eyes:     General: No scleral icterus.    Conjunctiva/sclera: Conjunctivae normal.  Pulmonary:     Effort: Pulmonary effort is normal. No respiratory distress.  Chest:       Comments: Port to right chest wall. The incision itself is without erythema, ecchymosis, or swelling. This is intact, no evidence of dehiscence. At this time, there is no bleeding nor drainage from this area. Port reservoir accessed without issue.  Genitourinary:    Comments: Deferred Skin:    General: Skin is warm and dry.  Neurological:     General: No focal deficit present.     Mental Status: She is alert. Mental status is at baseline.  Psychiatric:        Mood and Affect: Mood normal.        Behavior: Behavior normal.     Right Chest Port Site (11/25/2022):     Labs:     Latest Ref Rng & Units 11/24/2022    5:59 PM 11/22/2022    9:07 AM 11/15/2022    8:12 AM  CBC  WBC 4.0 - 10.5 K/uL 4.8  3.4  15.6   Hemoglobin 12.0 - 15.0 g/dL 9.6  08.6  9.6   Hematocrit 36.0 - 46.0 % 31.6  32.5  32.0   Platelets 150 - 400 K/uL 112  115  266       Latest Ref Rng & Units 11/24/2022    5:59 PM 11/22/2022    9:07 AM 11/15/2022     8:12 AM  CMP  Glucose 70 - 99 mg/dL 578  79  99   BUN 6 - 20 mg/dL 10  12  12    Creatinine 0.44 - 1.00 mg/dL 4.69  6.29  5.28   Sodium 135 - 145 mmol/L 135  136  137   Potassium 3.5 - 5.1 mmol/L 3.6  3.7  3.8   Chloride 98 - 111 mmol/L 102  106  109   CO2 22 - 32 mmol/L 23  26  24    Calcium 8.9 - 10.3 mg/dL 8.8  8.8  8.5   Total Protein 6.5 - 8.1 g/dL 7.6  7.3  6.5   Total Bilirubin 0.3 - 1.2 mg/dL 0.5  0.4  0.2   Alkaline Phos 38 - 126 U/L 67  71  108   AST 15 - 41 U/L 20  19  29    ALT 0 - 44 U/L 23  25  30      Imaging studies:   CTA Chest (11/24/2022) personally reviewed, port in adequate position, no fluid collection noted, and radiologist report reviewed below:  IMPRESSION: 1. Pulmonary emboli within the distal right main pulmonary artery extending into segmental branches of the right lower lobe and right middle lobe. No evidence for right heart strain. 2. Multifocal patchy ground-glass and airspace opacities in the right middle lobe and right lower lobe worrisome for multifocal pneumonia. 3. Small peripheral opacity in the posterior right lower lobe may represent pulmonary infarct.   Assessment/Plan:  37 y.o. female with now controlled/stopped bleeding/drainage from port site without evidence of infection, hematoma, nor dehiscence, complicated by pertinent commodities including left breast CA, now with PE as well.   - She may have developed small seroma vs hematoma  which found a small weak point in the incision and spontaneously drained. There is not any significant violation of the wound and certainly the wound itself is not dehisced.  All drainage has stopped and there is no evidence of hematoma, seroma, abscess, or infection at this time. I do not think she needs any intervention currently. Wound continue local wound care. Okay to continue to access port as needed.   - Nothing further from surgical perspective at this time - Further management per primary service and  other consultants   - We will be available as needed or if situation should change.    All of the above findings and recommendations were discussed with the patient, and all of patient's questions were answered to her expressed satisfaction.  Thank you for the opportunity to participate in this patient's care.   -- Lynden Oxford, PA-C [facility] 11/25/2022, 7:15 AM M-F: 7am - 4pm

## 2022-11-25 NOTE — Progress Notes (Signed)
PHARMACY - ANTICOAGULATION CONSULT NOTE  Pharmacy Consult for Heparin  Indication: pulmonary embolus  No Known Allergies  Patient Measurements: Height: 5' (152.4 cm) Weight: 64 kg (141 lb 1.6 oz) IBW/kg (Calculated) : 45.5 Heparin Dosing Weight: 59 kg   Vital Signs: Temp: 98.1 F (36.7 C) (10/13 2202) Temp Source: Oral (10/13 2202) BP: 100/62 (10/14 0451) Pulse Rate: 84 (10/14 0230)  Labs: Recent Labs    11/22/22 0907 11/24/22 1759 11/24/22 2159 11/25/22 0447  HGB 10.0* 9.6*  --   --   HCT 32.5* 31.6*  --   --   PLT 115* 112*  --   --   APTT  --   --  28  --   LABPROT  --  14.3  --   --   INR  --  1.1  --   --   HEPARINUNFRC  --   --   --  0.26*  CREATININE 0.53 0.44  --   --     Estimated Creatinine Clearance: 81.2 mL/min (by C-G formula based on SCr of 0.44 mg/dL).   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  No prior anticoag noted. CrCl = 81.2 ml/min  Goal of Therapy:  Heparin level 0.3-0.7 units/ml Monitor platelets by anticoagulation protocol: Yes   Plan:  10/14:  HL @ 0447 = 0.26, SUBtherapeutic - Will order heparin 900 units IV X 1 bolus and increase drip rate to 1100 units/hr - Will recheck HL 6 hrs after rate change   Penny Gomez D 11/25/2022,5:41 AM

## 2022-11-27 ENCOUNTER — Telehealth: Payer: Self-pay | Admitting: *Deleted

## 2022-11-27 NOTE — Telephone Encounter (Signed)
Patient called reporting that she went to hospital last week and has blood clots in her womb and is asking if this is going to affect her getting chemotherapy on Friday. Please return her call

## 2022-11-28 ENCOUNTER — Encounter: Payer: Self-pay | Admitting: Oncology

## 2022-11-28 MED FILL — Dexamethasone Sodium Phosphate Inj 100 MG/10ML: INTRAMUSCULAR | Qty: 1 | Status: AC

## 2022-11-28 NOTE — Telephone Encounter (Signed)
Call returned to patient and informed of doctor response

## 2022-11-29 ENCOUNTER — Inpatient Hospital Stay: Payer: Medicaid Other

## 2022-11-29 ENCOUNTER — Other Ambulatory Visit: Payer: Self-pay | Admitting: Oncology

## 2022-11-29 ENCOUNTER — Other Ambulatory Visit: Payer: Self-pay

## 2022-11-29 ENCOUNTER — Encounter: Payer: Self-pay | Admitting: Oncology

## 2022-11-29 ENCOUNTER — Inpatient Hospital Stay (HOSPITAL_BASED_OUTPATIENT_CLINIC_OR_DEPARTMENT_OTHER): Payer: Medicaid Other | Admitting: Oncology

## 2022-11-29 VITALS — BP 113/68 | HR 74 | Temp 95.6°F | Resp 20 | Wt 138.6 lb

## 2022-11-29 VITALS — BP 112/62 | HR 80 | Temp 97.4°F | Resp 18

## 2022-11-29 DIAGNOSIS — C50919 Malignant neoplasm of unspecified site of unspecified female breast: Secondary | ICD-10-CM | POA: Diagnosis not present

## 2022-11-29 DIAGNOSIS — I2699 Other pulmonary embolism without acute cor pulmonale: Secondary | ICD-10-CM | POA: Diagnosis not present

## 2022-11-29 DIAGNOSIS — Z5112 Encounter for antineoplastic immunotherapy: Secondary | ICD-10-CM | POA: Diagnosis not present

## 2022-11-29 DIAGNOSIS — D500 Iron deficiency anemia secondary to blood loss (chronic): Secondary | ICD-10-CM

## 2022-11-29 DIAGNOSIS — Z5111 Encounter for antineoplastic chemotherapy: Secondary | ICD-10-CM | POA: Diagnosis not present

## 2022-11-29 LAB — CMP (CANCER CENTER ONLY)
ALT: 20 U/L (ref 0–44)
AST: 17 U/L (ref 15–41)
Albumin: 3.7 g/dL (ref 3.5–5.0)
Alkaline Phosphatase: 75 U/L (ref 38–126)
Anion gap: 5 (ref 5–15)
BUN: 11 mg/dL (ref 6–20)
CO2: 24 mmol/L (ref 22–32)
Calcium: 8.6 mg/dL — ABNORMAL LOW (ref 8.9–10.3)
Chloride: 108 mmol/L (ref 98–111)
Creatinine: 0.51 mg/dL (ref 0.44–1.00)
GFR, Estimated: >60 mL/min (ref >60)
Glucose, Bld: 105 mg/dL — ABNORMAL HIGH (ref 70–99)
Potassium: 3.6 mmol/L (ref 3.5–5.1)
Sodium: 137 mmol/L (ref 135–145)
Total Bilirubin: 0.2 mg/dL — ABNORMAL LOW (ref 0.3–1.2)
Total Protein: 7 g/dL (ref 6.5–8.1)

## 2022-11-29 LAB — IRON AND TIBC
Iron: 62 ug/dL (ref 28–170)
Saturation Ratios: 21 % (ref 10.4–31.8)
TIBC: 300 ug/dL (ref 250–450)
UIBC: 238 ug/dL

## 2022-11-29 LAB — CULTURE, BLOOD (ROUTINE X 2)
Culture: NO GROWTH
Culture: NO GROWTH
Special Requests: ADEQUATE

## 2022-11-29 LAB — FERRITIN: Ferritin: 186 ng/mL (ref 11–307)

## 2022-11-29 LAB — CBC WITH DIFFERENTIAL (CANCER CENTER ONLY)
Abs Immature Granulocytes: 0.02 10*3/uL (ref 0.00–0.07)
Basophils Absolute: 0 10*3/uL (ref 0.0–0.1)
Basophils Relative: 1 %
Eosinophils Absolute: 0 10*3/uL (ref 0.0–0.5)
Eosinophils Relative: 0 %
HCT: 29.3 % — ABNORMAL LOW (ref 36.0–46.0)
Hemoglobin: 9.2 g/dL — ABNORMAL LOW (ref 12.0–15.0)
Immature Granulocytes: 1 %
Lymphocytes Relative: 46 %
Lymphs Abs: 1.5 10*3/uL (ref 0.7–4.0)
MCH: 25.3 pg — ABNORMAL LOW (ref 26.0–34.0)
MCHC: 31.4 g/dL (ref 30.0–36.0)
MCV: 80.5 fL (ref 80.0–100.0)
Monocytes Absolute: 0.3 10*3/uL (ref 0.1–1.0)
Monocytes Relative: 8 %
Neutro Abs: 1.4 10*3/uL — ABNORMAL LOW (ref 1.7–7.7)
Neutrophils Relative %: 44 %
Platelet Count: 250 10*3/uL (ref 150–400)
RBC: 3.64 MIL/uL — ABNORMAL LOW (ref 3.87–5.11)
Smear Review: NORMAL
WBC Count: 3.1 10*3/uL — ABNORMAL LOW (ref 4.0–10.5)
nRBC: 0 % (ref 0.0–0.2)

## 2022-11-29 LAB — PREGNANCY, URINE: Preg Test, Ur: NEGATIVE

## 2022-11-29 LAB — RETIC PANEL
Immature Retic Fract: 21.3 % — ABNORMAL HIGH (ref 2.3–15.9)
RBC.: 3.74 MIL/uL — ABNORMAL LOW (ref 3.87–5.11)
Retic Count, Absolute: 77.8 10*3/uL (ref 19.0–186.0)
Retic Ct Pct: 2.1 % (ref 0.4–3.1)
Reticulocyte Hemoglobin: 34.5 pg (ref >27.9)

## 2022-11-29 MED ORDER — SODIUM CHLORIDE 0.9 % IV SOLN
200.0000 mg | Freq: Once | INTRAVENOUS | Status: AC
  Administered 2022-11-29: 200 mg via INTRAVENOUS
  Filled 2022-11-29: qty 200

## 2022-11-29 MED ORDER — FAMOTIDINE IN NACL 20-0.9 MG/50ML-% IV SOLN
20.0000 mg | Freq: Once | INTRAVENOUS | Status: AC
  Administered 2022-11-29: 20 mg via INTRAVENOUS
  Filled 2022-11-29: qty 50

## 2022-11-29 MED ORDER — SODIUM CHLORIDE 0.9 % IV SOLN
80.0000 mg/m2 | Freq: Once | INTRAVENOUS | Status: AC
  Administered 2022-11-29: 126 mg via INTRAVENOUS
  Filled 2022-11-29: qty 21

## 2022-11-29 MED ORDER — SODIUM CHLORIDE 0.9 % IV SOLN
Freq: Once | INTRAVENOUS | Status: AC
  Filled 2022-11-29: qty 250

## 2022-11-29 MED ORDER — SODIUM CHLORIDE 0.9 % IV SOLN
10.0000 mg | Freq: Once | INTRAVENOUS | Status: AC
  Administered 2022-11-29: 10 mg via INTRAVENOUS
  Filled 2022-11-29: qty 10

## 2022-11-29 MED ORDER — HEPARIN SOD (PORK) LOCK FLUSH 100 UNIT/ML IV SOLN
500.0000 [IU] | Freq: Once | INTRAVENOUS | Status: AC | PRN
  Administered 2022-11-29: 500 [IU]
  Filled 2022-11-29: qty 5

## 2022-11-29 MED ORDER — DIPHENHYDRAMINE HCL 50 MG/ML IJ SOLN
50.0000 mg | Freq: Once | INTRAMUSCULAR | Status: AC
  Administered 2022-11-29: 50 mg via INTRAVENOUS
  Filled 2022-11-29: qty 1

## 2022-11-29 NOTE — Progress Notes (Signed)
Patient states she is spotting again. She states her monthly menstrual went off last week on Tuesday which lasted 7 days. Patient Started spotting again on Thursday.

## 2022-11-29 NOTE — Progress Notes (Signed)
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  Receiving neoadjuvant chemotherapy.  Noted ED visit with pulmonary embolism.  Met with patient and mother during treatment.  Patient reports that appetite is good.  Eating small frequent meals/snacks.  Yesterday ate chicken biscuit, tomatoes, cucumbers, grapes, apples, frozen meal that she added more chicken to and egg.  Has not gotten shakes from [employer] but expect to have them next week.      Medications: reviewed  Labs: reviewed  Anthropometrics:   Weight 138 lb 9.6 oz today 133 lb 3.2 oz on 9/20 135 lb 8/29 UBW   NUTRITION DIAGNOSIS:  Inadequate oral intake stable   INTERVENTION:  [employer] to send boost plus shakes BID next week Continue well balanced diet including lean protein foods and plant foods.  Patient has contact information    MONITORING, EVALUATION, GOAL: weight trends, intake   NEXT VISIT: as needed  Lealand Elting B. Freida Busman, RD, LDN Registered Dietitian [phone]

## 2022-11-29 NOTE — Assessment & Plan Note (Addendum)
Labs are reviewed and discussed with patient. Lab Results  Component Value Date   HGB 9.2 (L) 11/29/2022   TIBC 300 11/29/2022   IRONPCTSAT 21 11/29/2022   FERRITIN 186 11/29/2022   Hb is stable. Iron panel has improved. Close monitor

## 2022-11-29 NOTE — Assessment & Plan Note (Signed)
Continue Eliquis as instructed

## 2022-11-29 NOTE — Patient Instructions (Signed)
[facility]  Discharge Instructions: Thank you for choosing [facility] to provide your oncology and hematology care.  If you have a lab appointment with the [facility], please go directly to the [facility] and check in at the registration area.  Wear comfortable clothing and clothing appropriate for easy access to any Portacath or PICC line.   We strive to give you quality time with your provider. You may need to reschedule your appointment if you arrive late (15 or more minutes).  Arriving late affects you and other patients whose appointments are after yours.  Also, if you miss three or more appointments without notifying the office, you may be dismissed from the clinic at the provider's discretion.      For prescription refill requests, have your pharmacy contact our office and allow 72 hours for refills to be completed.    Today you received the following chemotherapy and/or immunotherapy agents- taxol      To help prevent nausea and vomiting after your treatment, we encourage you to take your nausea medication as directed.  BELOW ARE SYMPTOMS THAT SHOULD BE REPORTED IMMEDIATELY: *FEVER GREATER THAN 100.4 F (38 C) OR HIGHER *CHILLS OR SWEATING *NAUSEA AND VOMITING THAT IS NOT CONTROLLED WITH YOUR NAUSEA MEDICATION *UNUSUAL SHORTNESS OF BREATH *UNUSUAL BRUISING OR BLEEDING *URINARY PROBLEMS (pain or burning when urinating, or frequent urination) *BOWEL PROBLEMS (unusual diarrhea, constipation, pain near the anus) TENDERNESS IN MOUTH AND THROAT WITH OR WITHOUT PRESENCE OF ULCERS (sore throat, sores in mouth, or a toothache) UNUSUAL RASH, SWELLING OR PAIN  UNUSUAL VAGINAL DISCHARGE OR ITCHING   Items with * indicate a potential emergency and should be followed up as soon as possible or go to the Emergency Department if any problems should occur.  Please show the CHEMOTHERAPY ALERT CARD or IMMUNOTHERAPY ALERT CARD at check-in to the  Emergency Department and triage nurse.  Should you have questions after your visit or need to cancel or reschedule your appointment, please contact [facility]  [phone] and follow the prompts.  Office hours are 8:00 a.m. to 4:30 p.m. Monday - Friday. Please note that voicemails left after 4:00 p.m. may not be returned until the following business day.  We are closed weekends and major holidays. You have access to a nurse at all times for urgent questions. Please call the main number to the clinic [phone] and follow the prompts.  For any non-urgent questions, you may also contact your provider using MyChart. We now offer e-Visits for anyone 10 and older to request care online for non-urgent symptoms. For details visit [email or web address].   Also download the MyChart app! Go to the app store, search "MyChart", open the app, select [facility], and log in with your MyChart username and password.

## 2022-11-29 NOTE — Assessment & Plan Note (Signed)
Chemotherapy plan as listed above 

## 2022-11-29 NOTE — Progress Notes (Signed)
Hematology/Oncology Consult Note [phone] [phone]    CHIEF COMPLAINTS/PURPOSE OF CONSULTATION:  Left triple negative breast cancer.   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  Images and pathology results were reviewed and discussed with patient.  MRI showed  6.7 cm of abnormal enhancement, predominantly in the lower-inner inferior left breast, with the ultrasound-guided biopsy marking clip centrally positioned within the enhancement. Suspicious for involvement by cancer, cT3 disease.  Patient desires mastectomy. Patient has no desire of preserving fertility.  Currently on neoadjuvant chemotherapy with Carboplatin, taxol Keytruda followed by [facility] Q3 weeks   Proceed with C2D15 taxol, . She will return for D16-18 GCSF    IDA (iron deficiency anemia) Labs are reviewed and discussed with patient. Lab Results  Component Value Date   HGB 9.2 (L) 11/29/2022   TIBC 300 11/29/2022   IRONPCTSAT 21 11/29/2022   FERRITIN 186 11/29/2022   Hb is stable. Iron panel has improved. Close monitor  Encounter for antineoplastic chemotherapy Chemotherapy plan as listed above.   Acute pulmonary embolism (HCC) Continue Eliquis as instructed  Orders Placed This Encounter  Procedures   CBC with Differential (Cancer Center Only)    Standing Status:   Future    Standing Expiration Date:   01/17/2024   CMP (Cancer Center only)    Standing Status:   Future    Standing Expiration Date:   01/17/2024   Follow-up  1 week lab  chemotherapy.  All questions were answered. The patient knows to call the clinic with any problems, questions or concerns.  [name], MD, PhD [facility] Hematology  Oncology 11/29/2022    HISTORY OF PRESENTING ILLNESS:  Penny Gomez 37 y.o. female presents to establish care for left triple negative breast cancer I have reviewed her chart and materials related to her cancer extensively and collaborated history with the patient. Summary of oncologic history is as follows: Oncology History  Invasive carcinoma of breast (HCC)  07/24/2022 Mammogram   She noticed left breast mass for 1 month.  Bilateral diagnostic mammogram showed Suspicious palpable left breast mass 8 o'clock position. Cortically thickened left axillary lymph node   10/10/2022 Initial Diagnosis   Invasive carcinoma of breast (HCC)  10/02/2022  Left breast mass biopsy and left axillary lymph node biopsy.  Diagnosis 1. Breast, left, needle core biopsy, 8 o'clock 6 cmfn, heart clip - INVASIVE MAMMARY CARCINOMA, NO SPECIAL TYPE. - TUBULE FORMATION: SCORE 3 - NUCLEAR PLEOMORPHISM: SCORE 3 - MITOTIC COUNT: SCORE 3 - TOTAL SCORE: 9 - OVERALL GRADE: 3 - LYMPHOVASCULAR INVASION: NOT IDENTIFIED - CANCER LENGTH: 11 MM - CALCIFICATIONS: NOT IDENTIFIED - DUCTAL CARCINOMA IN SITU: PRESENT, HIGH-GRADE - ER-, PR- HER2 - [IHC 1+] 2. Lymph node, needle/core biopsy, left axillary, hydromark - LYMPH NODE WITH REACTIVE CHANGES; NEGATIVE FOR MALIGNANCY.  Menarche at age of 25 First live birth at age of 8 OCP use: no History of hysterectomy: no Menopausal status: premenopausal History of HRT use: no History of chest radiation: no Number of previous breast biopsies:  right breast biopsy 05/20/2019 negative for malignancy Strong family history of cancer mother breast cancer diagnosed in 28s, maternal aunts x 2 breast cancer, maternal cousins breast cancer x 2, maternal grandmother pancreatic  cancer.    10/10/2022 Cancer Staging   Staging form: Breast, AJCC 8th Edition - Clinical stage from 10/10/2022: Stage IIIB (cT3, cN0, cM0, G3, ER-, PR-, HER2-) - Signed by [name], MD on 10/18/2022 Stage  prefix: Initial diagnosis Nuclear grade: G3 Histologic grading system: 3 grade system   10/17/2022 Imaging   Bilateral MRI breasts w wo contrast  1. In total, there is approximately 6.7 cm of abnormal enhancement, predominantly in the lower-inner inferior left breast, with the ultrasound-guided biopsy marking clip centrally positioned within the enhancement. There are scattered small enhancing masses in the lower inner and lower outer anterior left (included in the measurement above).   2.  No evidence of right breast malignancy.   3. The axillary lymph nodes are not included within the field of view of this MRI for adequate assessment of lymphadenopathy.    10/17/2022 Procedure   S/p medi port placement.    10/25/2022 - 10/25/2022 Chemotherapy   Patient is on Treatment Plan : BREAST ADJUVANT DOSE DENSE AC q14d / PACLitaxel q7d     10/25/2022 -  Chemotherapy   Patient is on Treatment Plan : BREAST Pembrolizumab (200) D1 + Carboplatin (5) D1 + Paclitaxel (80) D1,8,15 q21d X 4 cycles / Pembrolizumab (200) D1 + AC D1 q21d x 4 cycles      Genetic Testing   Single pathogenic variant in BRCA1 called c.190T>G identified on the [facility] Multi-Cancer+RNA panel. The remainder of testing was normal. The report date is 11/11/2022.  The Multi-Cancer + RNA Panel offered by [facility] includes sequencing and/or deletion/duplication analysis of the following 70 genes:  AIP*, ALK, APC*, ATM*, AXIN2*, BAP1*, BARD1*, BLM*, BMPR1A*, BRCA1*, BRCA2*, BRIP1*, CDC73*, CDH1*, CDK4, CDKN1B*, CDKN2A, CHEK2*, CTNNA1*, DICER1*, EPCAM, EGFR, FH*, FLCN*, GREM1, HOXB13, KIT, LZTR1, MAX*, MBD4, MEN1*, MET, MITF, MLH1*, MSH2*, MSH3*, MSH6*, MUTYH*, NF1*, NF2*, NTHL1*, PALB2*, PDGFRA, PMS2*, POLD1*, POLE*, POT1*, PRKAR1A*, PTCH1*, PTEN*, RAD51C*, RAD51D*, RB1*, RET, SDHA*, SDHAF2*, SDHB*, SDHC*, SDHD*, SMAD4*, SMARCA4*, SMARCB1*, SMARCE1*, STK11*, SUFU*, TMEM127*, TP53*, TSC1*, TSC2*, VHL*. RNA analysis is performed for * genes.    11/24/2022 - 11/25/2022 Hospital Admission   Admitted due to acute pulmonary embolism, lower extremity US negative for DVT.  Patietn was started on heparin gtt and transitioned to Eliquis at discharge.     Today she was accompanied by her mother.  She tolerates chemotherapy.  On Eliquis 10mg  BID for recent diagnosis of PE. Some vaginal spotting.  Denies any SOB, cough, fever or chills.     MEDICAL HISTORY:  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    SURGICAL HISTORY: Past Surgical History:  Procedure Laterality Date   BREAST BIOPSY Right 05/20/2019   Affirm Bx- X- clip, neg   BREAST BIOPSY Left 10/02/2022   [place] Bx, path pending   BREAST BIOPSY Left 10/02/2022   [place] Bx Node- path pending   BREAST BIOPSY Left 10/02/2022   [place] LT BREAST BX W LOC DEV 1ST LESION IMG BX SPEC US GUIDE 10/02/2022 [facility]   CESAREAN SECTION     LAPAROSCOPIC BILATERAL SALPINGECTOMY Bilateral 02/09/2022   Procedure: LAPAROSCOPIC BILATERAL SALPINGECTOMY;  Surgeon: Hildred Laser, MD;  Location: [facility];  Service: Gynecology;  Laterality: Bilateral;   PORTACATH PLACEMENT Right 10/17/2022   Procedure: INSERTION PORT-A-CATH;  Surgeon: Henrene Dodge, MD;  Location: [facility] ORS;  Service: General;  Laterality: Right;   TUBAL LIGATION      SOCIAL HISTORY: Social History   Socioeconomic History   Marital status: Single    Spouse name: Not on file   Number of children: 2   Years of education: Not on file   Highest education level: High school graduate  Occupational History   Not on file  Tobacco Use   Smoking status: Never   Smokeless tobacco: Never  Vaping Use   Vaping status: Never Used  Substance and Sexual Activity   Alcohol use: Yes    Comment: occ   Drug use: No   Sexual activity: Yes    Birth control/protection: Surgical   Other Topics Concern   Not on file  Social History Narrative   Not on file   Social Determinants of Health   Financial Resource Strain: Low Risk  (10/10/2022)   Overall Financial Resource Strain (CARDIA)    Difficulty of Paying Living Expenses: Not very hard  Food Insecurity: No Food Insecurity (11/25/2022)   Hunger Vital Sign    Worried About Running Out of Food in the Last Year: Never true    Ran Out of Food in the Last Year: Never true  Transportation Needs: No Transportation Needs (11/25/2022)   PRAPARE - Administrator, Civil Service (Medical): No    Lack of Transportation (Non-Medical): No  Physical Activity: Not on file  Stress: No Stress Concern Present (10/10/2022)   [employer] of Occupational Health - Occupational Stress Questionnaire    Feeling of Stress : Only a little  Social Connections: Not on file  Intimate Partner Violence: Not At Risk (11/25/2022)   Humiliation, Afraid, Rape, and Kick questionnaire    Fear of Current or Ex-Partner: No    Emotionally Abused: No    Physically Abused: No    Sexually Abused: No    FAMILY HISTORY: Family History  Problem Relation Age of Onset   Breast cancer Mother 28       no GT   Breast cancer Maternal Aunt 33   Breast cancer Maternal Aunt    Lung cancer Maternal Uncle    Lung cancer Maternal Uncle    Bone cancer Paternal Aunt    Pancreatic cancer Maternal Grandmother 77   Lung cancer Maternal Grandfather 66   Breast cancer Cousin 40   Breast cancer Other 33       gene pos?    ALLERGIES:  has No Known Allergies.  MEDICATIONS:  Current Outpatient Medications  Medication Sig Dispense Refill   acetaminophen (TYLENOL) 500 MG tablet Take 2 tablets (1,000 mg total) by mouth every 6 (six) hours as needed for mild pain.     apixaban (ELIQUIS) 5 MG TABS tablet Take 1 tablet (5 mg total) by mouth 2 (two) times daily. Start AFTER completing your starter pack 60 tablet 1   APIXABAN (ELIQUIS) VTE STARTER PACK  (10MG  AND 5MG ) Take as directed on package: start with two-5mg  tablets twice daily for 7 days. On day 8, switch to one-5mg  tablet twice daily. 74 each 0   Calcium Carbonate-Vitamin D (CALCIUM 600 +D HIGH POTENCY) 600-10 MG-MCG TABS Take 1 tablet by mouth 2 (two) times daily. 60 tablet 3   lidocaine-prilocaine (EMLA) cream Apply 1 Application topically as needed. (Patient not taking: Reported on 11/15/2022) 30 g 11   LORazepam (ATIVAN) 0.5 MG tablet Take 1 tablet (0.5 mg total) by mouth every 8 (eight) hours as needed for  anxiety (nausea or vomiting). (Patient not taking: Reported on 10/29/2022) 30 tablet 0   ondansetron (ZOFRAN) 8 MG tablet Take 1 tablet (8 mg total) by mouth See admin instructions. If needed, start on Day 3 of chemotherapy, take 1 tablet every 8 hours as needed for nausea or vomiting. (Patient not taking: Reported on 10/25/2022) 90 tablet 0   prochlorperazine (COMPAZINE) 10 MG tablet Take 1 tablet (10 mg total) by mouth every 6 (six) hours as needed for nausea or vomiting. (Patient not taking: Reported on 10/25/2022) 90 tablet 3   senna (SENOKOT) 8.6 MG TABS tablet Take 2 tablets (17.2 mg total) by mouth daily. (Patient not taking: Reported on 11/29/2022) 120 tablet 2   No current facility-administered medications for this visit.   Facility-Administered Medications Ordered in Other Visits  Medication Dose Route Frequency Provider Last Rate Last Admin   PACLitaxel (TAXOL) 126 mg in sodium chloride 0.9 % 250 mL chemo infusion (</= 80mg /m2)  80 mg/m2 (Treatment Plan Recorded) Intravenous Once [name], MD 271 mL/hr at 11/29/22 1140 126 mg at 11/29/22 1140    Review of Systems  Constitutional:  Negative for appetite change, chills, fatigue and fever.  HENT:   Negative for hearing loss and voice change.   Eyes:  Negative for eye problems.  Respiratory:  Negative for chest tightness and cough.   Cardiovascular:  Negative for chest pain.  Gastrointestinal:  Negative for abdominal  distention, abdominal pain and blood in stool.  Endocrine: Negative for hot flashes.  Genitourinary:  Positive for menstrual problem. Negative for difficulty urinating and frequency.   Musculoskeletal:  Negative for arthralgias.  Skin:  Negative for itching and rash.  Neurological:  Negative for extremity weakness.  Hematological:  Negative for adenopathy.  Psychiatric/Behavioral:  Negative for confusion.      PHYSICAL EXAMINATION: ECOG PERFORMANCE STATUS: 0 - Asymptomatic  Vitals:   11/29/22 0845  BP: 113/68  Pulse: 74  Resp: 20  Temp: (!) 95.6 F (35.3 C)  SpO2: 100%   Filed Weights   11/29/22 0845  Weight: 138 lb 9.6 oz (62.9 kg)    Physical Exam Constitutional:      General: She is not in acute distress.    Appearance: She is not diaphoretic.  HENT:     Head: Normocephalic and atraumatic.  Eyes:     General: No scleral icterus. Cardiovascular:     Rate and Rhythm: Normal rate and regular rhythm.  Pulmonary:     Effort: Pulmonary effort is normal. No respiratory distress.     Breath sounds: No wheezing.  Abdominal:     General: Bowel sounds are normal. There is no distension.     Palpations: Abdomen is soft.  Musculoskeletal:        General: Normal range of motion.     Cervical back: Normal range of motion and neck supple.  Skin:    General: Skin is warm and dry.     Findings: No erythema.  Neurological:     Mental Status: She is alert and oriented to person, place, and time. Mental status is at baseline.     Motor: No abnormal muscle tone.  Psychiatric:        Mood and Affect: Mood and affect normal.     LABORATORY DATA:  I have reviewed the data as listed    Latest Ref Rng & Units 11/29/2022    8:32 AM 11/24/2022    5:59 PM 11/22/2022    9:07 AM  CBC  WBC  4.0 - 10.5 K/uL 3.1  4.8  3.4   Hemoglobin 12.0 - 15.0 g/dL 9.2  9.6  16.1   Hematocrit 36.0 - 46.0 % 29.3  31.6  32.5   Platelets 150 - 400 K/uL 250  112  115       Latest Ref Rng & Units  11/29/2022    8:32 AM 11/24/2022    5:59 PM 11/22/2022    9:07 AM  CMP  Glucose 70 - 99 mg/dL 096  045  79   BUN 6 - 20 mg/dL 11  10  12    Creatinine 0.44 - 1.00 mg/dL 4.09  8.11  9.14   Sodium 135 - 145 mmol/L 137  135  136   Potassium 3.5 - 5.1 mmol/L 3.6  3.6  3.7   Chloride 98 - 111 mmol/L 108  102  106   CO2 22 - 32 mmol/L 24  23  26    Calcium 8.9 - 10.3 mg/dL 8.6  8.8  8.8   Total Protein 6.5 - 8.1 g/dL 7.0  7.6  7.3   Total Bilirubin 0.3 - 1.2 mg/dL 0.2  0.5  0.4   Alkaline Phos 38 - 126 U/L 75  67  71   AST 15 - 41 U/L 17  20  19    ALT 0 - 44 U/L 20  23  25       RADIOGRAPHIC STUDIES: I have personally reviewed the radiological images as listed and agreed with the findings in the report. CT Angio Chest PE W and/or Wo Contrast  Addendum Date: 11/25/2022   ADDENDUM REPORT: 11/25/2022 18:42 ADDENDUM: These results were called by telephone at the time of interpretation on 11/24/2022 at 8:50 pm to provider [facility] , who verbally acknowledged these results. Electronically Signed   By: Darliss Cheney M.D.   On: 11/25/2022 18:42   Result Date: 11/25/2022 CLINICAL DATA:  High probability for PE. EXAM: CT ANGIOGRAPHY CHEST WITH CONTRAST TECHNIQUE: Multidetector CT imaging of the chest was performed using the standard protocol during bolus administration of intravenous contrast. Multiplanar CT image reconstructions and MIPs were obtained to evaluate the vascular anatomy. RADIATION DOSE REDUCTION: This exam was performed according to the departmental dose-optimization program which includes automated exposure control, adjustment of the mA and/or kV according to patient size and/or use of iterative reconstruction technique. CONTRAST:  75mL OMNIPAQUE IOHEXOL 350 MG/ML SOLN COMPARISON:  None Available. FINDINGS: Cardiovascular: Pulmonary emboli are seen within the distal right main pulmonary artery extending into segmental branches of the right lower lobe and right middle lobe. Heart is  normal in size. Aorta is normal in size. Right chest port catheter tip ends in the right atrium. There is no pericardial effusion. Mediastinum/Nodes: No enlarged mediastinal, hilar, or axillary lymph nodes. Thyroid gland, trachea, and esophagus demonstrate no significant findings. Lungs/Pleura: Multifocal patchy ground-glass and airspace opacities are seen in the right middle lobe and right lower lobe. There is a small peripheral opacity in the posterior right lower lobe which may represent pulmonary infarct. There is no pleural effusion or pneumothorax. Upper Abdomen: No acute abnormality. Musculoskeletal: No chest wall abnormality. No acute or significant osseous findings. Review of the MIP images confirms the above findings. IMPRESSION: 1. Pulmonary emboli within the distal right main pulmonary artery extending into segmental branches of the right lower lobe and right middle lobe. No evidence for right heart strain. 2. Multifocal patchy ground-glass and airspace opacities in the right middle lobe and right lower lobe worrisome for multifocal  pneumonia. 3. Small peripheral opacity in the posterior right lower lobe may represent pulmonary infarct. Electronically Signed: By: Darliss Cheney M.D. On: 11/24/2022 20:49   US Venous Img Lower Bilateral (DVT)  Result Date: 11/25/2022 CLINICAL DATA:  37 year old female with history of pulmonary embolism EXAM: BILATERAL LOWER EXTREMITY VENOUS DOPPLER ULTRASOUND TECHNIQUE: Gray-scale sonography with graded compression, as well as color Doppler and duplex ultrasound were performed to evaluate the lower extremity deep venous systems from the level of the common femoral vein and including the common femoral, femoral, profunda femoral, popliteal and calf veins including the posterior tibial, peroneal and gastrocnemius veins when visible. The superficial great saphenous vein was also interrogated. Spectral Doppler was utilized to evaluate flow at rest and with distal  augmentation maneuvers in the common femoral, femoral and popliteal veins. COMPARISON:  None Available. FINDINGS: RIGHT LOWER EXTREMITY Common Femoral Vein: No evidence of thrombus. Normal compressibility, respiratory phasicity and response to augmentation. Saphenofemoral Junction: No evidence of thrombus. Normal compressibility and flow on color Doppler imaging. Profunda Femoral Vein: No evidence of thrombus. Normal compressibility and flow on color Doppler imaging. Femoral Vein: No evidence of thrombus. Normal compressibility, respiratory phasicity and response to augmentation. Popliteal Vein: No evidence of thrombus. Normal compressibility, respiratory phasicity and response to augmentation. Calf Veins: No evidence of thrombus. Normal compressibility and flow on color Doppler imaging. Superficial Great Saphenous Vein: No evidence of thrombus. Normal compressibility and flow on color Doppler imaging. Other Findings:  None. LEFT LOWER EXTREMITY Common Femoral Vein: No evidence of thrombus. Normal compressibility, respiratory phasicity and response to augmentation. Saphenofemoral Junction: No evidence of thrombus. Normal compressibility and flow on color Doppler imaging. Profunda Femoral Vein: No evidence of thrombus. Normal compressibility and flow on color Doppler imaging. Femoral Vein: No evidence of thrombus. Normal compressibility, respiratory phasicity and response to augmentation. Popliteal Vein: No evidence of thrombus. Normal compressibility, respiratory phasicity and response to augmentation. Calf Veins: No evidence of thrombus. Normal compressibility and flow on color Doppler imaging. Superficial Great Saphenous Vein: No evidence of thrombus. Normal compressibility and flow on color Doppler imaging. Other Findings:  None. IMPRESSION: Directed duplex of the bilateral lower extremities negative for DVT Signed, Yvone Neu. Miachel Roux, RPVI [facility]  Radiology Electronically Signed   By: Gilmer Mor D.O.   On: 11/25/2022 09:54   DG Chest 2 View  Result Date: 11/24/2022 CLINICAL DATA:  Bleeding from port site. EXAM: CHEST - 2 VIEW COMPARISON:  Chest x-ray dated October 17, 2022. FINDINGS: Right chest wall port catheter in unchanged position with the tip in the proximal SVC. The heart size and mediastinal contours are within normal limits. Normal pulmonary vascularity. New patchy opacities in the right lower lobe. Clear left lung. No pleural effusion or pneumothorax. No acute osseous abnormality. IMPRESSION: 1. Unchanged position of the right chest wall port catheter. 2. New right lower lobe pneumonia. Electronically Signed   By: Obie Dredge M.D.   On: 11/24/2022 16:41

## 2022-11-29 NOTE — Assessment & Plan Note (Addendum)
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  Images and pathology results were reviewed and discussed with patient.  MRI showed  6.7 cm of abnormal enhancement, predominantly in the lower-inner inferior left breast, with the ultrasound-guided biopsy marking clip centrally positioned within the enhancement. Suspicious for involvement by cancer, cT3 disease.  Patient desires mastectomy. Patient has no desire of preserving fertility.  Currently on neoadjuvant chemotherapy with Carboplatin, taxol Keytruda followed by [facility] Q3 weeks   Proceed with C2D15 taxol, . She will return for D16-18 GCSF

## 2022-11-30 ENCOUNTER — Other Ambulatory Visit: Payer: Self-pay

## 2022-12-02 ENCOUNTER — Encounter: Payer: Self-pay | Admitting: Oncology

## 2022-12-02 ENCOUNTER — Inpatient Hospital Stay: Payer: Medicaid Other

## 2022-12-02 DIAGNOSIS — Z5112 Encounter for antineoplastic immunotherapy: Secondary | ICD-10-CM | POA: Diagnosis not present

## 2022-12-02 DIAGNOSIS — C50919 Malignant neoplasm of unspecified site of unspecified female breast: Secondary | ICD-10-CM

## 2022-12-02 MED ORDER — FILGRASTIM-SNDZ 480 MCG/0.8ML IJ SOSY
480.0000 ug | PREFILLED_SYRINGE | Freq: Once | INTRAMUSCULAR | Status: AC
  Administered 2022-12-02: 480 ug via SUBCUTANEOUS
  Filled 2022-12-02: qty 0.8

## 2022-12-03 ENCOUNTER — Inpatient Hospital Stay: Payer: Medicaid Other

## 2022-12-03 DIAGNOSIS — Z5112 Encounter for antineoplastic immunotherapy: Secondary | ICD-10-CM | POA: Diagnosis not present

## 2022-12-03 DIAGNOSIS — C50919 Malignant neoplasm of unspecified site of unspecified female breast: Secondary | ICD-10-CM

## 2022-12-03 MED ORDER — FILGRASTIM-SNDZ 480 MCG/0.8ML IJ SOSY
480.0000 ug | PREFILLED_SYRINGE | Freq: Once | INTRAMUSCULAR | Status: AC
  Administered 2022-12-03: 480 ug via SUBCUTANEOUS
  Filled 2022-12-03: qty 0.8

## 2022-12-04 ENCOUNTER — Inpatient Hospital Stay: Payer: Medicaid Other

## 2022-12-04 DIAGNOSIS — Z5112 Encounter for antineoplastic immunotherapy: Secondary | ICD-10-CM | POA: Diagnosis not present

## 2022-12-04 DIAGNOSIS — C50919 Malignant neoplasm of unspecified site of unspecified female breast: Secondary | ICD-10-CM

## 2022-12-04 MED ORDER — FILGRASTIM-SNDZ 480 MCG/0.8ML IJ SOSY
480.0000 ug | PREFILLED_SYRINGE | Freq: Once | INTRAMUSCULAR | Status: AC
  Administered 2022-12-04: 480 ug via SUBCUTANEOUS
  Filled 2022-12-04: qty 0.8

## 2022-12-05 MED FILL — Fosaprepitant Dimeglumine For IV Infusion 150 MG (Base Eq): INTRAVENOUS | Qty: 5 | Status: AC

## 2022-12-06 ENCOUNTER — Other Ambulatory Visit: Payer: Self-pay

## 2022-12-06 ENCOUNTER — Inpatient Hospital Stay: Payer: Medicaid Other

## 2022-12-06 ENCOUNTER — Encounter: Payer: Self-pay | Admitting: Oncology

## 2022-12-06 ENCOUNTER — Inpatient Hospital Stay (HOSPITAL_BASED_OUTPATIENT_CLINIC_OR_DEPARTMENT_OTHER): Payer: Medicaid Other | Admitting: Oncology

## 2022-12-06 VITALS — BP 119/67 | HR 91 | Temp 96.3°F | Resp 18 | Wt 139.1 lb

## 2022-12-06 DIAGNOSIS — D500 Iron deficiency anemia secondary to blood loss (chronic): Secondary | ICD-10-CM

## 2022-12-06 DIAGNOSIS — C50919 Malignant neoplasm of unspecified site of unspecified female breast: Secondary | ICD-10-CM

## 2022-12-06 DIAGNOSIS — Z5111 Encounter for antineoplastic chemotherapy: Secondary | ICD-10-CM

## 2022-12-06 DIAGNOSIS — Z5112 Encounter for antineoplastic immunotherapy: Secondary | ICD-10-CM | POA: Diagnosis not present

## 2022-12-06 DIAGNOSIS — Z1501 Genetic susceptibility to malignant neoplasm of breast: Secondary | ICD-10-CM

## 2022-12-06 DIAGNOSIS — I2699 Other pulmonary embolism without acute cor pulmonale: Secondary | ICD-10-CM

## 2022-12-06 DIAGNOSIS — E876 Hypokalemia: Secondary | ICD-10-CM | POA: Insufficient documentation

## 2022-12-06 DIAGNOSIS — Z1509 Genetic susceptibility to other malignant neoplasm: Secondary | ICD-10-CM

## 2022-12-06 DIAGNOSIS — Z789 Other specified health status: Secondary | ICD-10-CM

## 2022-12-06 LAB — CBC WITH DIFFERENTIAL (CANCER CENTER ONLY)
Abs Immature Granulocytes: 2.12 10*3/uL — ABNORMAL HIGH (ref 0.00–0.07)
Basophils Absolute: 0 10*3/uL (ref 0.0–0.1)
Basophils Relative: 0 %
Eosinophils Absolute: 0 10*3/uL (ref 0.0–0.5)
Eosinophils Relative: 0 %
HCT: 34.1 % — ABNORMAL LOW (ref 36.0–46.0)
Hemoglobin: 10.6 g/dL — ABNORMAL LOW (ref 12.0–15.0)
Immature Granulocytes: 15 %
Lymphocytes Relative: 18 %
Lymphs Abs: 2.6 10*3/uL (ref 0.7–4.0)
MCH: 26.2 pg (ref 26.0–34.0)
MCHC: 31.1 g/dL (ref 30.0–36.0)
MCV: 84.4 fL (ref 80.0–100.0)
Monocytes Absolute: 0.9 10*3/uL (ref 0.1–1.0)
Monocytes Relative: 6 %
Neutro Abs: 8.7 10*3/uL — ABNORMAL HIGH (ref 1.7–7.7)
Neutrophils Relative %: 61 %
Platelet Count: 278 10*3/uL (ref 150–400)
RBC: 4.04 MIL/uL (ref 3.87–5.11)
Smear Review: NORMAL
WBC Count: 14.3 10*3/uL — ABNORMAL HIGH (ref 4.0–10.5)
nRBC: 0.3 % — ABNORMAL HIGH (ref 0.0–0.2)

## 2022-12-06 LAB — TSH: TSH: 1.614 u[IU]/mL (ref 0.350–4.500)

## 2022-12-06 LAB — CMP (CANCER CENTER ONLY)
ALT: 17 U/L (ref 0–44)
AST: 27 U/L (ref 15–41)
Albumin: 3.9 g/dL (ref 3.5–5.0)
Alkaline Phosphatase: 113 U/L (ref 38–126)
Anion gap: 9 (ref 5–15)
BUN: 11 mg/dL (ref 6–20)
CO2: 23 mmol/L (ref 22–32)
Calcium: 8.8 mg/dL — ABNORMAL LOW (ref 8.9–10.3)
Chloride: 105 mmol/L (ref 98–111)
Creatinine: 0.63 mg/dL (ref 0.44–1.00)
GFR, Estimated: >60 mL/min (ref >60)
Glucose, Bld: 153 mg/dL — ABNORMAL HIGH (ref 70–99)
Potassium: 3.1 mmol/L — ABNORMAL LOW (ref 3.5–5.1)
Sodium: 137 mmol/L (ref 135–145)
Total Bilirubin: 0.2 mg/dL — ABNORMAL LOW (ref 0.3–1.2)
Total Protein: 7 g/dL (ref 6.5–8.1)

## 2022-12-06 MED ORDER — DIPHENHYDRAMINE HCL 50 MG/ML IJ SOLN
50.0000 mg | Freq: Once | INTRAMUSCULAR | Status: AC
  Administered 2022-12-06: 50 mg via INTRAVENOUS
  Filled 2022-12-06: qty 1

## 2022-12-06 MED ORDER — PACLITAXEL CHEMO INJECTION 300 MG/50ML
80.0000 mg/m2 | Freq: Once | INTRAVENOUS | Status: AC
  Administered 2022-12-06: 126 mg via INTRAVENOUS
  Filled 2022-12-06 (×2): qty 21

## 2022-12-06 MED ORDER — FOSAPREPITANT DIMEGLUMINE INJECTION 150 MG
150.0000 mg | Freq: Once | INTRAVENOUS | Status: AC
  Administered 2022-12-06: 150 mg via INTRAVENOUS
  Filled 2022-12-06: qty 150

## 2022-12-06 MED ORDER — POTASSIUM CHLORIDE CRYS ER 20 MEQ PO TBCR: 20.0000 meq | EXTENDED_RELEASE_TABLET | Freq: Every day | ORAL | 0 refills | Status: DC

## 2022-12-06 MED ORDER — SODIUM CHLORIDE 0.9% FLUSH
10.0000 mL | INTRAVENOUS | Status: DC | PRN
  Filled 2022-12-06: qty 10

## 2022-12-06 MED ORDER — PALONOSETRON HCL INJECTION 0.25 MG/5ML
0.2500 mg | Freq: Once | INTRAVENOUS | Status: AC
  Administered 2022-12-06: 0.25 mg via INTRAVENOUS
  Filled 2022-12-06: qty 5

## 2022-12-06 MED ORDER — FAMOTIDINE IN NACL 20-0.9 MG/50ML-% IV SOLN
20.0000 mg | Freq: Once | INTRAVENOUS | Status: AC
  Administered 2022-12-06: 20 mg via INTRAVENOUS
  Filled 2022-12-06: qty 50

## 2022-12-06 MED ORDER — SODIUM CHLORIDE 0.9 % IV SOLN
200.0000 mg | Freq: Once | INTRAVENOUS | Status: AC
  Administered 2022-12-06: 200 mg via INTRAVENOUS
  Filled 2022-12-06: qty 8

## 2022-12-06 MED ORDER — SODIUM CHLORIDE 0.9 % IV SOLN
596.0000 mg | Freq: Once | INTRAVENOUS | Status: AC
  Administered 2022-12-06: 600 mg via INTRAVENOUS
  Filled 2022-12-06: qty 60

## 2022-12-06 MED ORDER — DEXAMETHASONE SODIUM PHOSPHATE 10 MG/ML IJ SOLN
10.0000 mg | Freq: Once | INTRAMUSCULAR | Status: AC
  Administered 2022-12-06: 10 mg via INTRAVENOUS
  Filled 2022-12-06: qty 1

## 2022-12-06 MED ORDER — HEPARIN SOD (PORK) LOCK FLUSH 100 UNIT/ML IV SOLN
500.0000 [IU] | Freq: Once | INTRAVENOUS | Status: AC | PRN
  Administered 2022-12-06: 500 [IU]
  Filled 2022-12-06: qty 5

## 2022-12-06 MED ORDER — SODIUM CHLORIDE 0.9 % IV SOLN
Freq: Once | INTRAVENOUS | Status: AC
  Filled 2022-12-06: qty 250

## 2022-12-06 NOTE — Assessment & Plan Note (Signed)
Continue Eliquis 5mg BID 

## 2022-12-06 NOTE — Progress Notes (Signed)
Hematology/Oncology Progress note [phone] [phone]       CHIEF COMPLAINTS/PURPOSE OF CONSULTATION:  Left triple negative breast cancer.   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  Images and pathology results were reviewed and discussed with patient.  MRI showed  6.7 cm of abnormal enhancement, predominantly in the lower-inner inferior left breast, with the ultrasound-guided biopsy marking clip centrally positioned within the enhancement. Suspicious for involvement by cancer, cT3 disease.  Patient desires mastectomy. Patient has no desire of preserving fertility.  Currently on neoadjuvant chemotherapy with Carboplatin, taxol Keytruda followed by [facility] Q3 weeks   Proceed with C3 D1  carbo/taxol/Keytruda., . She will return in 1 week and 2 weeks for Lab Taxol, and D16-18 GCSF  IDA (iron deficiency anemia) Labs are reviewed and discussed with patient. Lab Results  Component Value Date   HGB 10.6 (L) 12/06/2022   TIBC 300 11/29/2022   IRONPCTSAT 21 11/29/2022   FERRITIN 186 11/29/2022   Hb is stable. Iron panel has improved. Close monitor  Encounter for antineoplastic chemotherapy Chemotherapy plan as listed above.   BRCA1 gene mutation positive Once she finished treatment of breast cancer, recommend risk-reducing salpingo oophorectomy  pancreatic screening at age 72 (or 55 years younger than the earliest exocrine pancreatic cancer diagnosis in the family, whichever is earlier)  Family members are encouraged to consider genetic testing for this familial pathogenic variant.   Acute pulmonary embolism (HCC) Continue Eliquis 5mg  BID  Hypokalemia Recommend potassium chloride  daily  Orders Placed This Encounter  Procedures   CBC with Differential (Cancer Center Only)    Standing Status:   Future    Standing Expiration Date:   02/09/2024   CMP (Cancer Center only)    Standing Status:   Future    Standing Expiration Date:   02/09/2024   CBC with Differential (Cancer Center Only)    Standing Status:   Future    Standing Expiration Date:   03/01/2024   CMP (Cancer Center only)    Standing Status:   Future    Standing Expiration Date:   03/01/2024   CBC with Differential (Cancer Center Only)    Standing Status:   Future    Standing Expiration Date:   03/22/2024   CMP (Cancer Center only)    Standing Status:   Future    Standing Expiration Date:   03/22/2024   Follow-up per LOS  All questions were answered. The patient knows to call the clinic with any problems, questions or concerns.  [name], MD, PhD [facility] Hematology Oncology 12/06/2022    HISTORY OF PRESENTING ILLNESS:  Penny Gomez 37 y.o. female presents to establish care for left triple negative breast cancer I have reviewed her chart and materials related to her cancer extensively and collaborated history with the patient. Summary of oncologic history is as follows: Oncology History  Invasive carcinoma of breast (HCC)  07/24/2022 Mammogram   She noticed left breast mass for 1 month.  Bilateral diagnostic mammogram showed Suspicious palpable left breast mass 8 o'clock position. Cortically thickened left axillary lymph node   10/10/2022 Initial Diagnosis   Invasive carcinoma of breast (HCC)  10/02/2022  Left breast mass biopsy and left  axillary lymph node biopsy.  Diagnosis 1. Breast, left, needle core biopsy, 8 o'clock 6 cmfn, heart clip - INVASIVE MAMMARY CARCINOMA, NO SPECIAL TYPE. - TUBULE FORMATION: SCORE 3 - NUCLEAR PLEOMORPHISM: SCORE 3 - MITOTIC COUNT: SCORE 3 - TOTAL SCORE: 9 - OVERALL GRADE: 3 - LYMPHOVASCULAR INVASION: NOT IDENTIFIED - CANCER LENGTH: 11 MM -  CALCIFICATIONS: NOT IDENTIFIED - DUCTAL CARCINOMA IN SITU: PRESENT, HIGH-GRADE - ER-, PR- HER2 - [IHC 1+] 2. Lymph node, needle/core biopsy, left axillary, hydromark - LYMPH NODE WITH REACTIVE CHANGES; NEGATIVE FOR MALIGNANCY.  Menarche at age of 13 First live birth at age of 7 OCP use: no History of hysterectomy: no Menopausal status: premenopausal History of HRT use: no History of chest radiation: no Number of previous breast biopsies:  right breast biopsy 05/20/2019 negative for malignancy Strong family history of cancer mother breast cancer diagnosed in 2s, maternal aunts x 2 breast cancer, maternal cousins breast cancer x 2, maternal grandmother pancreatic cancer.    10/10/2022 Cancer Staging   Staging form: Breast, AJCC 8th Edition - Clinical stage from 10/10/2022: Stage IIIB (cT3, cN0, cM0, G3, ER-, PR-, HER2-) - Signed by [name], MD on 10/18/2022 Stage prefix: Initial diagnosis Nuclear grade: G3 Histologic grading system: 3 grade system   10/17/2022 Imaging   Bilateral MRI breasts w wo contrast  1. In total, there is approximately 6.7 cm of abnormal enhancement, predominantly in the lower-inner inferior left breast, with the ultrasound-guided biopsy marking clip centrally positioned within the enhancement. There are scattered small enhancing masses in the lower inner and lower outer anterior left (included in the measurement above).   2.  No evidence of right breast malignancy.   3. The axillary lymph nodes are not included within the field of view of this MRI for adequate assessment of lymphadenopathy.    10/17/2022 Procedure   S/p medi port placement.    10/25/2022 - 10/25/2022 Chemotherapy   Patient is on Treatment Plan : BREAST ADJUVANT DOSE DENSE AC q14d / PACLitaxel q7d     10/25/2022 -  Chemotherapy   Patient is on Treatment Plan : BREAST Pembrolizumab (200) D1 + Carboplatin (5) D1 + Paclitaxel (80) D1,8,15 q21d X 4 cycles / Pembrolizumab (200) D1 + AC D1 q21d x 4 cycles       Genetic Testing   Single pathogenic variant in BRCA1 called c.190T>G identified on the [facility] Multi-Cancer+RNA panel. The remainder of testing was normal. The report date is 11/11/2022.  The Multi-Cancer + RNA Panel offered by [facility] includes sequencing and/or deletion/duplication analysis of the following 70 genes:  AIP*, ALK, APC*, ATM*, AXIN2*, BAP1*, BARD1*, BLM*, BMPR1A*, BRCA1*, BRCA2*, BRIP1*, CDC73*, CDH1*, CDK4, CDKN1B*, CDKN2A, CHEK2*, CTNNA1*, DICER1*, EPCAM, EGFR, FH*, FLCN*, GREM1, HOXB13, KIT, LZTR1, MAX*, MBD4, MEN1*, MET, MITF, MLH1*, MSH2*, MSH3*, MSH6*, MUTYH*, NF1*, NF2*, NTHL1*, PALB2*, PDGFRA, PMS2*, POLD1*, POLE*, POT1*, PRKAR1A*, PTCH1*, PTEN*, RAD51C*, RAD51D*, RB1*, RET, SDHA*, SDHAF2*, SDHB*, SDHC*, SDHD*, SMAD4*, SMARCA4*, SMARCB1*, SMARCE1*, STK11*, SUFU*, TMEM127*, TP53*, TSC1*, TSC2*, VHL*. RNA analysis is performed for * genes.   11/24/2022 - 11/25/2022 Hospital Admission   Admitted due to acute pulmonary embolism, lower extremity US negative for DVT.  Patietn was started on heparin gtt and transitioned to Eliquis at discharge.     Today she presents for evaluation prior to chemotherapy She tolerates chemotherapy.  On Eliquis 5mg  BID for PE. She tolerates well.  Denies any SOB, cough, fever or chills.     MEDICAL HISTORY:  Past Medical History:  Diagnosis Date  Breast lump    Fibroid, uterine    Microcytic anemia    STD (sexually transmitted disease)    From Medical Hx;   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    SURGICAL HISTORY: Past Surgical History:  Procedure Laterality Date   BREAST BIOPSY Right 05/20/2019   Affirm Bx- X- clip, neg   BREAST BIOPSY Left 10/02/2022   [place] Bx, path pending   BREAST BIOPSY Left 10/02/2022   [place] Bx Node- path pending   BREAST BIOPSY Left 10/02/2022   [place] LT BREAST BX W LOC DEV 1ST LESION IMG BX SPEC US GUIDE 10/02/2022 [facility]   CESAREAN SECTION     LAPAROSCOPIC BILATERAL SALPINGECTOMY  Bilateral 02/09/2022   Procedure: LAPAROSCOPIC BILATERAL SALPINGECTOMY;  Surgeon: Hildred Laser, MD;  Location: [facility];  Service: Gynecology;  Laterality: Bilateral;   PORTACATH PLACEMENT Right 10/17/2022   Procedure: INSERTION PORT-A-CATH;  Surgeon: Henrene Dodge, MD;  Location: [facility];  Service: General;  Laterality: Right;   TUBAL LIGATION      SOCIAL HISTORY: Social History   Socioeconomic History   Marital status: Single    Spouse name: Not on file   Number of children: 2   Years of education: Not on file   Highest education level: High school graduate  Occupational History   Not on file  Tobacco Use   Smoking status: Never   Smokeless tobacco: Never  Vaping Use   Vaping status: Never Used  Substance and Sexual Activity   Alcohol use: Yes    Comment: occ   Drug use: No   Sexual activity: Yes    Birth control/protection: Surgical  Other Topics Concern   Not on file  Social History Narrative   Not on file   Social Determinants of Health   Financial Resource Strain: Low Risk  (10/10/2022)   Overall Financial Resource Strain (CARDIA)    Difficulty of Paying Living Expenses: Not very hard  Food Insecurity: No Food Insecurity (11/25/2022)   Hunger Vital Sign    Worried About Running Out of Food in the Last Year: Never true    Ran Out of Food in the Last Year: Never true  Transportation Needs: No Transportation Needs (11/25/2022)   PRAPARE - Administrator, Civil Service (Medical): No    Lack of Transportation (Non-Medical): No  Physical Activity: Not on file  Stress: No Stress Concern Present (10/10/2022)   [employer] of Occupational Health - Occupational Stress Questionnaire    Feeling of Stress : Only a little  Social Connections: Not on file  Intimate Partner Violence: Not At Risk (11/25/2022)   Humiliation, Afraid, Rape, and Kick questionnaire    Fear of Current or Ex-Partner: No    Emotionally Abused: No    Physically Abused: No     Sexually Abused: No    FAMILY HISTORY: Family History  Problem Relation Age of Onset   Breast cancer Mother 41       no GT   Breast cancer Maternal Aunt 33   Breast cancer Maternal Aunt    Lung cancer Maternal Uncle    Lung cancer Maternal Uncle    Bone cancer Paternal Aunt    Pancreatic cancer Maternal Grandmother 40   Lung cancer Maternal Grandfather 5   Breast cancer Cousin 40   Breast cancer Other 33       gene pos?    ALLERGIES:  has No Known Allergies.  MEDICATIONS:  Current Outpatient Medications  Medication  Sig Dispense Refill   acetaminophen (TYLENOL) 500 MG tablet Take 2 tablets (1,000 mg total) by mouth every 6 (six) hours as needed for mild pain.     apixaban (ELIQUIS) 5 MG TABS tablet Take 1 tablet (5 mg total) by mouth 2 (two) times daily. Start AFTER completing your starter pack 60 tablet 1   APIXABAN (ELIQUIS) VTE STARTER PACK (10MG  AND 5MG ) Take as directed on package: start with two-5mg  tablets twice daily for 7 days. On day 8, switch to one-5mg  tablet twice daily. 74 each 0   Calcium Carbonate-Vitamin D (CALCIUM 600 +D HIGH POTENCY) 600-10 MG-MCG TABS Take 1 tablet by mouth 2 (two) times daily. 60 tablet 3   potassium chloride SA (KLOR-CON M) 20 MEQ tablet Take 1 tablet (20 mEq total) by mouth daily. 30 tablet 0   lidocaine-prilocaine (EMLA) cream Apply 1 Application topically as needed. (Patient not taking: Reported on 11/15/2022) 30 g 11   LORazepam (ATIVAN) 0.5 MG tablet Take 1 tablet (0.5 mg total) by mouth every 8 (eight) hours as needed for anxiety (nausea or vomiting). (Patient not taking: Reported on 10/29/2022) 30 tablet 0   ondansetron (ZOFRAN) 8 MG tablet Take 1 tablet (8 mg total) by mouth See admin instructions. If needed, start on Day 3 of chemotherapy, take 1 tablet every 8 hours as needed for nausea or vomiting. (Patient not taking: Reported on 10/25/2022) 90 tablet 0   prochlorperazine (COMPAZINE) 10 MG tablet Take 1 tablet (10 mg total) by mouth  every 6 (six) hours as needed for nausea or vomiting. (Patient not taking: Reported on 10/25/2022) 90 tablet 3   senna (SENOKOT) 8.6 MG TABS tablet Take 2 tablets (17.2 mg total) by mouth daily. (Patient not taking: Reported on 11/29/2022) 120 tablet 2   No current facility-administered medications for this visit.   Facility-Administered Medications Ordered in Other Visits  Medication Dose Route Frequency Provider Last Rate Last Admin   CARBOplatin (PARAPLATIN) 600 mg in sodium chloride 0.9 % 250 mL chemo infusion  600 mg Intravenous Once [name], MD       heparin lock flush 100 unit/mL  500 Units Intracatheter Once PRN [name], MD       PACLitaxel (TAXOL) 126 mg in sodium chloride 0.9 % 250 mL chemo infusion (</= 80mg /m2)  80 mg/m2 (Treatment Plan Recorded) Intravenous Once [name], MD       pembrolizumab [facility]) 200 mg in sodium chloride 0.9 % 50 mL chemo infusion  200 mg Intravenous Once [name], MD 116 mL/hr at 12/06/22 1029 200 mg at 12/06/22 1029   sodium chloride flush (NS) 0.9 % injection 10 mL  10 mL Intracatheter PRN [name], MD        Review of Systems  Constitutional:  Negative for appetite change, chills, fatigue and fever.  HENT:   Negative for hearing loss and voice change.   Eyes:  Negative for eye problems.  Respiratory:  Negative for chest tightness and cough.   Cardiovascular:  Negative for chest pain.  Gastrointestinal:  Negative for abdominal distention, abdominal pain and blood in stool.  Endocrine: Negative for hot flashes.  Genitourinary:  Positive for menstrual problem. Negative for difficulty urinating and frequency.   Musculoskeletal:  Negative for arthralgias.  Skin:  Negative for itching and rash.  Neurological:  Negative for extremity weakness.  Hematological:  Negative for adenopathy.  Psychiatric/Behavioral:  Negative for confusion.      PHYSICAL EXAMINATION: ECOG PERFORMANCE STATUS: 0 - Asymptomatic  Vitals:  12/06/22 0845  BP: 119/67   Pulse: 91  Resp: 18  Temp: (!) 96.3 F (35.7 C)  SpO2: 100%   Filed Weights   12/06/22 0845  Weight: 139 lb 1.6 oz (63.1 kg)    Physical Exam Constitutional:      General: She is not in acute distress.    Appearance: She is not diaphoretic.  HENT:     Head: Normocephalic and atraumatic.  Eyes:     General: No scleral icterus. Cardiovascular:     Rate and Rhythm: Normal rate and regular rhythm.  Pulmonary:     Effort: Pulmonary effort is normal. No respiratory distress.     Breath sounds: No wheezing.  Abdominal:     General: Bowel sounds are normal. There is no distension.     Palpations: Abdomen is soft.  Musculoskeletal:        General: Normal range of motion.     Cervical back: Normal range of motion and neck supple.  Skin:    General: Skin is warm and dry.     Findings: No erythema.  Neurological:     Mental Status: She is alert and oriented to person, place, and time. Mental status is at baseline.     Motor: No abnormal muscle tone.  Psychiatric:        Mood and Affect: Mood and affect normal.     LABORATORY DATA:  I have reviewed the data as listed    Latest Ref Rng & Units 12/06/2022    8:34 AM 11/29/2022    8:32 AM 11/24/2022    5:59 PM  CBC  WBC 4.0 - 10.5 K/uL 14.3  3.1  4.8   Hemoglobin 12.0 - 15.0 g/dL 16.1  9.2  9.6   Hematocrit 36.0 - 46.0 % 34.1  29.3  31.6   Platelets 150 - 400 K/uL 278  250  112       Latest Ref Rng & Units 12/06/2022    8:34 AM 11/29/2022    8:32 AM 11/24/2022    5:59 PM  CMP  Glucose 70 - 99 mg/dL 096  045  409   BUN 6 - 20 mg/dL 11  11  10    Creatinine 0.44 - 1.00 mg/dL 8.11  9.14  7.82   Sodium 135 - 145 mmol/L 137  137  135   Potassium 3.5 - 5.1 mmol/L 3.1  3.6  3.6   Chloride 98 - 111 mmol/L 105  108  102   CO2 22 - 32 mmol/L 23  24  23    Calcium 8.9 - 10.3 mg/dL 8.8  8.6  8.8   Total Protein 6.5 - 8.1 g/dL 7.0  7.0  7.6   Total Bilirubin 0.3 - 1.2 mg/dL 0.2  0.2  0.5   Alkaline Phos 38 - 126 U/L 113  75   67   AST 15 - 41 U/L 27  17  20    ALT 0 - 44 U/L 17  20  23       RADIOGRAPHIC STUDIES: I have personally reviewed the radiological images as listed and agreed with the findings in the report. CT Angio Chest PE W and/or Wo Contrast  Addendum Date: 11/25/2022   ADDENDUM REPORT: 11/25/2022 18:42 ADDENDUM: These results were called by telephone at the time of interpretation on 11/24/2022 at 8:50 pm to provider [facility] , who verbally acknowledged these results. Electronically Signed   By: Darliss Cheney M.D.   On: 11/25/2022 18:42   Result  Date: 11/25/2022 CLINICAL DATA:  High probability for PE. EXAM: CT ANGIOGRAPHY CHEST WITH CONTRAST TECHNIQUE: Multidetector CT imaging of the chest was performed using the standard protocol during bolus administration of intravenous contrast. Multiplanar CT image reconstructions and MIPs were obtained to evaluate the vascular anatomy. RADIATION DOSE REDUCTION: This exam was performed according to the departmental dose-optimization program which includes automated exposure control, adjustment of the mA and/or kV according to patient size and/or use of iterative reconstruction technique. CONTRAST:  75mL OMNIPAQUE IOHEXOL 350 MG/ML SOLN COMPARISON:  None Available. FINDINGS: Cardiovascular: Pulmonary emboli are seen within the distal right main pulmonary artery extending into segmental branches of the right lower lobe and right middle lobe. Heart is normal in size. Aorta is normal in size. Right chest port catheter tip ends in the right atrium. There is no pericardial effusion. Mediastinum/Nodes: No enlarged mediastinal, hilar, or axillary lymph nodes. Thyroid gland, trachea, and esophagus demonstrate no significant findings. Lungs/Pleura: Multifocal patchy ground-glass and airspace opacities are seen in the right middle lobe and right lower lobe. There is a small peripheral opacity in the posterior right lower lobe which may represent pulmonary infarct. There is no  pleural effusion or pneumothorax. Upper Abdomen: No acute abnormality. Musculoskeletal: No chest wall abnormality. No acute or significant osseous findings. Review of the MIP images confirms the above findings. IMPRESSION: 1. Pulmonary emboli within the distal right main pulmonary artery extending into segmental branches of the right lower lobe and right middle lobe. No evidence for right heart strain. 2. Multifocal patchy ground-glass and airspace opacities in the right middle lobe and right lower lobe worrisome for multifocal pneumonia. 3. Small peripheral opacity in the posterior right lower lobe may represent pulmonary infarct. Electronically Signed: By: Darliss Cheney M.D. On: 11/24/2022 20:49   US Venous Img Lower Bilateral (DVT)  Result Date: 11/25/2022 CLINICAL DATA:  37 year old female with history of pulmonary embolism EXAM: BILATERAL LOWER EXTREMITY VENOUS DOPPLER ULTRASOUND TECHNIQUE: Gray-scale sonography with graded compression, as well as color Doppler and duplex ultrasound were performed to evaluate the lower extremity deep venous systems from the level of the common femoral vein and including the common femoral, femoral, profunda femoral, popliteal and calf veins including the posterior tibial, peroneal and gastrocnemius veins when visible. The superficial great saphenous vein was also interrogated. Spectral Doppler was utilized to evaluate flow at rest and with distal augmentation maneuvers in the common femoral, femoral and popliteal veins. COMPARISON:  None Available. FINDINGS: RIGHT LOWER EXTREMITY Common Femoral Vein: No evidence of thrombus. Normal compressibility, respiratory phasicity and response to augmentation. Saphenofemoral Junction: No evidence of thrombus. Normal compressibility and flow on color Doppler imaging. Profunda Femoral Vein: No evidence of thrombus. Normal compressibility and flow on color Doppler imaging. Femoral Vein: No evidence of thrombus. Normal compressibility,  respiratory phasicity and response to augmentation. Popliteal Vein: No evidence of thrombus. Normal compressibility, respiratory phasicity and response to augmentation. Calf Veins: No evidence of thrombus. Normal compressibility and flow on color Doppler imaging. Superficial Great Saphenous Vein: No evidence of thrombus. Normal compressibility and flow on color Doppler imaging. Other Findings:  None. LEFT LOWER EXTREMITY Common Femoral Vein: No evidence of thrombus. Normal compressibility, respiratory phasicity and response to augmentation. Saphenofemoral Junction: No evidence of thrombus. Normal compressibility and flow on color Doppler imaging. Profunda Femoral Vein: No evidence of thrombus. Normal compressibility and flow on color Doppler imaging. Femoral Vein: No evidence of thrombus. Normal compressibility, respiratory phasicity and response to augmentation. Popliteal Vein: No evidence of thrombus. Normal  compressibility, respiratory phasicity and response to augmentation. Calf Veins: No evidence of thrombus. Normal compressibility and flow on color Doppler imaging. Superficial Great Saphenous Vein: No evidence of thrombus. Normal compressibility and flow on color Doppler imaging. Other Findings:  None. IMPRESSION: Directed duplex of the bilateral lower extremities negative for DVT Signed, Yvone Neu. Miachel Roux, RPVI [facility] Electronically Signed   By: Gilmer Mor D.O.   On: 11/25/2022 09:54   DG Chest 2 View  Result Date: 11/24/2022 CLINICAL DATA:  Bleeding from port site. EXAM: CHEST - 2 VIEW COMPARISON:  Chest x-ray dated October 17, 2022. FINDINGS: Right chest wall port catheter in unchanged position with the tip in the proximal SVC. The heart size and mediastinal contours are within normal limits. Normal pulmonary vascularity. New patchy opacities in the right lower lobe. Clear left lung. No pleural effusion or pneumothorax. No  acute osseous abnormality. IMPRESSION: 1. Unchanged position of the right chest wall port catheter. 2. New right lower lobe pneumonia. Electronically Signed   By: Obie Dredge M.D.   On: 11/24/2022 16:41

## 2022-12-06 NOTE — Assessment & Plan Note (Signed)
Labs are reviewed and discussed with patient. Lab Results  Component Value Date   HGB 10.6 (L) 12/06/2022   TIBC 300 11/29/2022   IRONPCTSAT 21 11/29/2022   FERRITIN 186 11/29/2022   Hb is stable. Iron panel has improved. Close monitor

## 2022-12-06 NOTE — Patient Instructions (Signed)
[facility]  Discharge Instructions: Thank you for choosing [facility] to provide your oncology and hematology care.  If you have a lab appointment with the Cancer Center, please go directly to the Cancer Center and check in at the registration area.  Wear comfortable clothing and clothing appropriate for easy access to any Portacath or PICC line.   We strive to give you quality time with your provider. You may need to reschedule your appointment if you arrive late (15 or more minutes).  Arriving late affects you and other patients whose appointments are after yours.  Also, if you miss three or more appointments without notifying the office, you may be dismissed from the clinic at the provider's discretion.      For prescription refill requests, have your pharmacy contact our office and allow 72 hours for refills to be completed.    Today you received the following chemotherapy and/or immunotherapy agents KEYTRUDA, TAXOL and CARBOPLATIN      To help prevent nausea and vomiting after your treatment, we encourage you to take your nausea medication as directed.  BELOW ARE SYMPTOMS THAT SHOULD BE REPORTED IMMEDIATELY: *FEVER GREATER THAN 100.4 F (38 C) OR HIGHER *CHILLS OR SWEATING *NAUSEA AND VOMITING THAT IS NOT CONTROLLED WITH YOUR NAUSEA MEDICATION *UNUSUAL SHORTNESS OF BREATH *UNUSUAL BRUISING OR BLEEDING *URINARY PROBLEMS (pain or burning when urinating, or frequent urination) *BOWEL PROBLEMS (unusual diarrhea, constipation, pain near the anus) TENDERNESS IN MOUTH AND THROAT WITH OR WITHOUT PRESENCE OF ULCERS (sore throat, sores in mouth, or a toothache) UNUSUAL RASH, SWELLING OR PAIN  UNUSUAL VAGINAL DISCHARGE OR ITCHING   Items with * indicate a potential emergency and should be followed up as soon as possible or go to the Emergency Department if any problems should occur.  Please show the CHEMOTHERAPY ALERT CARD or IMMUNOTHERAPY  ALERT CARD at check-in to the Emergency Department and triage nurse.  Should you have questions after your visit or need to cancel or reschedule your appointment, please contact [facility]  [phone] and follow the prompts.  Office hours are 8:00 a.m. to 4:30 p.m. Monday - Friday. Please note that voicemails left after 4:00 p.m. may not be returned until the following business day.  We are closed weekends and major holidays. You have access to a nurse at all times for urgent questions. Please call the main number to the clinic [phone] and follow the prompts.  For any non-urgent questions, you may also contact your provider using MyChart. We now offer e-Visits for anyone 18 and older to request care online for non-urgent symptoms. For details visit [email or web address].   Also download the MyChart app! Go to the app store, search "MyChart", open the app, select , and log in with your MyChart username and password.   Pembrolizumab Injection What is this medication? PEMBROLIZUMAB (PEM broe LIZ ue mab) treats some types of cancer. It works by helping your immune system slow or stop the spread of cancer cells. It is a monoclonal antibody. This medicine may be used for other purposes; ask your health care provider or pharmacist if you have questions. COMMON BRAND NAME(S): Keytruda What should I tell my care team before I take this medication? They need to know if you have any of these conditions: Allogeneic stem cell transplant (uses someone else's stem cells) Autoimmune diseases, such as Crohn disease, ulcerative colitis, lupus History of chest radiation Nervous system problems, such as   Guillain-Barre syndrome, myasthenia gravis Organ transplant An unusual or allergic reaction to pembrolizumab, other medications, foods, dyes, or preservatives Pregnant or trying to get pregnant Breast-feeding How should I use this medication? This medication  is injected into a vein. It is given by your care team in a hospital or clinic setting. A special MedGuide will be given to you before each treatment. Be sure to read this information carefully each time. Talk to your care team about the use of this medication in children. While it may be prescribed for children as young as 6 months for selected conditions, precautions do apply. Overdosage: If you think you have taken too much of this medicine contact a poison control center or emergency room at once. NOTE: This medicine is only for you. Do not share this medicine with others. What if I miss a dose? Keep appointments for follow-up doses. It is important not to miss your dose. Call your care team if you are unable to keep an appointment. What may interact with this medication? Interactions have not been studied. This list may not describe all possible interactions. Give your health care provider a list of all the medicines, herbs, non-prescription drugs, or dietary supplements you use. Also tell them if you smoke, drink alcohol, or use illegal drugs. Some items may interact with your medicine. What should I watch for while using this medication? Your condition will be monitored carefully while you are receiving this medication. You may need blood work while taking this medication. This medication may cause serious skin reactions. They can happen weeks to months after starting the medication. Contact your care team right away if you notice fevers or flu-like symptoms with a rash. The rash may be red or purple and then turn into blisters or peeling of the skin. You may also notice a red rash with swelling of the face, lips, or lymph nodes in your neck or under your arms. Tell your care team right away if you have any change in your eyesight. Talk to your care team if you may be pregnant. Serious birth defects can occur if you take this medication during pregnancy and for 4 months after the last dose. You  will need a negative pregnancy test before starting this medication. Contraception is recommended while taking this medication and for 4 months after the last dose. Your care team can help you find the option that works for you. Do not breastfeed while taking this medication and for 4 months after the last dose. What side effects may I notice from receiving this medication? Side effects that you should report to your care team as soon as possible: Allergic reactions--skin rash, itching, hives, swelling of the face, lips, tongue, or throat Dry cough, shortness of breath or trouble breathing Eye pain, redness, irritation, or discharge with blurry or decreased vision Heart muscle inflammation--unusual weakness or fatigue, shortness of breath, chest pain, fast or irregular heartbeat, dizziness, swelling of the ankles, feet, or hands Hormone gland problems--headache, sensitivity to light, unusual weakness or fatigue, dizziness, fast or irregular heartbeat, increased sensitivity to cold or heat, excessive sweating, constipation, hair loss, increased thirst or amount of urine, tremors or shaking, irritability Infusion reactions--chest pain, shortness of breath or trouble breathing, feeling faint or lightheaded Kidney injury (glomerulonephritis)--decrease in the amount of urine, red or dark brown urine, foamy or bubbly urine, swelling of the ankles, hands, or feet Liver injury--right upper belly pain, loss of appetite, nausea, light-colored stool, dark yellow or brown urine, yellowing skin   or eyes, unusual weakness or fatigue Pain, tingling, or numbness in the hands or feet, muscle weakness, change in vision, confusion or trouble speaking, loss of balance or coordination, trouble walking, seizures Rash, fever, and swollen lymph nodes Redness, blistering, peeling, or loosening of the skin, including inside the mouth Sudden or severe stomach pain, bloody diarrhea, fever, nausea, vomiting Side effects that  usually do not require medical attention (report to your care team if they continue or are bothersome): Bone, joint, or muscle pain Diarrhea Fatigue Loss of appetite Nausea Skin rash This list may not describe all possible side effects. Call your doctor for medical advice about side effects. You may report side effects to FDA at 1-800-FDA-1088. Where should I keep my medication? This medication is given in a hospital or clinic. It will not be stored at home. NOTE: This sheet is a summary. It may not cover all possible information. If you have questions about this medicine, talk to your doctor, pharmacist, or health care provider.  2024 Elsevier/Gold Standard (2021-06-12 00:00:00)  Paclitaxel Injection What is this medication? PACLITAXEL (PAK li TAX el) treats some types of cancer. It works by slowing down the growth of cancer cells. This medicine may be used for other purposes; ask your health care provider or pharmacist if you have questions. COMMON BRAND NAME(S): Onxol, Taxol What should I tell my care team before I take this medication? They need to know if you have any of these conditions: Heart disease Liver disease Low white blood cell levels An unusual or allergic reaction to paclitaxel, other medications, foods, dyes, or preservatives If you or your partner are pregnant or trying to get pregnant Breast-feeding How should I use this medication? This medication is injected into a vein. It is given by your care team in a hospital or clinic setting. Talk to your care team about the use of this medication in children. While it may be given to children for selected conditions, precautions do apply. Overdosage: If you think you have taken too much of this medicine contact a poison control center or emergency room at once. NOTE: This medicine is only for you. Do not share this medicine with others. What if I miss a dose? Keep appointments for follow-up doses. It is important not to  miss your dose. Call your care team if you are unable to keep an appointment. What may interact with this medication? Do not take this medication with any of the following: Live virus vaccines Other medications may affect the way this medication works. Talk with your care team about all of the medications you take. They may suggest changes to your treatment plan to lower the risk of side effects and to make sure your medications work as intended. This list may not describe all possible interactions. Give your health care provider a list of all the medicines, herbs, non-prescription drugs, or dietary supplements you use. Also tell them if you smoke, drink alcohol, or use illegal drugs. Some items may interact with your medicine. What should I watch for while using this medication? Your condition will be monitored carefully while you are receiving this medication. You may need blood work while taking this medication. This medication may make you feel generally unwell. This is not uncommon as chemotherapy can affect healthy cells as well as cancer cells. Report any side effects. Continue your course of treatment even though you feel ill unless your care team tells you to stop. This medication can cause serious allergic reactions. To   reduce the risk, your care team may give you other medications to take before receiving this one. Be sure to follow the directions from your care team. This medication may increase your risk of getting an infection. Call your care team for advice if you get a fever, chills, sore throat, or other symptoms of a cold or flu. Do not treat yourself. Try to avoid being around people who are sick. This medication may increase your risk to bruise or bleed. Call your care team if you notice any unusual bleeding. Be careful brushing or flossing your teeth or using a toothpick because you may get an infection or bleed more easily. If you have any dental work done, tell your dentist you are  receiving this medication. Talk to your care team if you may be pregnant. Serious birth defects can occur if you take this medication during pregnancy. Talk to your care team before breastfeeding. Changes to your treatment plan may be needed. What side effects may I notice from receiving this medication? Side effects that you should report to your care team as soon as possible: Allergic reactions--skin rash, itching, hives, swelling of the face, lips, tongue, or throat Heart rhythm changes--fast or irregular heartbeat, dizziness, feeling faint or lightheaded, chest pain, trouble breathing Increase in blood pressure Infection--fever, chills, cough, sore throat, wounds that don't heal, pain or trouble when passing urine, general feeling of discomfort or being unwell Low blood pressure--dizziness, feeling faint or lightheaded, blurry vision Low red blood cell level--unusual weakness or fatigue, dizziness, headache, trouble breathing Painful swelling, warmth, or redness of the skin, blisters or sores at the infusion site Pain, tingling, or numbness in the hands or feet Slow heartbeat--dizziness, feeling faint or lightheaded, confusion, trouble breathing, unusual weakness or fatigue Unusual bruising or bleeding Side effects that usually do not require medical attention (report to your care team if they continue or are bothersome): Diarrhea Hair loss Joint pain Loss of appetite Muscle pain Nausea Vomiting This list may not describe all possible side effects. Call your doctor for medical advice about side effects. You may report side effects to FDA at 1-800-FDA-1088. Where should I keep my medication? This medication is given in a hospital or clinic. It will not be stored at home. NOTE: This sheet is a summary. It may not cover all possible information. If you have questions about this medicine, talk to your doctor, pharmacist, or health care provider.  2024 Elsevier/Gold Standard (2021-06-19  00:00:00)  Carboplatin Injection What is this medication? CARBOPLATIN (KAR boe pla tin) treats some types of cancer. It works by slowing down the growth of cancer cells. This medicine may be used for other purposes; ask your health care provider or pharmacist if you have questions. COMMON BRAND NAME(S): Paraplatin What should I tell my care team before I take this medication? They need to know if you have any of these conditions: Blood disorders Hearing problems Kidney disease Recent or ongoing radiation therapy An unusual or allergic reaction to carboplatin, cisplatin, other medications, foods, dyes, or preservatives Pregnant or trying to get pregnant Breast-feeding How should I use this medication? This medication is injected into a vein. It is given by your care team in a hospital or clinic setting. Talk to your care team about the use of this medication in children. Special care may be needed. Overdosage: If you think you have taken too much of this medicine contact a poison control center or emergency room at once. NOTE: This medicine is only for   you. Do not share this medicine with others. What if I miss a dose? Keep appointments for follow-up doses. It is important not to miss your dose. Call your care team if you are unable to keep an appointment. What may interact with this medication? Medications for seizures Some antibiotics, such as amikacin, gentamicin, neomycin, streptomycin, tobramycin Vaccines This list may not describe all possible interactions. Give your health care provider a list of all the medicines, herbs, non-prescription drugs, or dietary supplements you use. Also tell them if you smoke, drink alcohol, or use illegal drugs. Some items may interact with your medicine. What should I watch for while using this medication? Your condition will be monitored carefully while you are receiving this medication. You may need blood work while taking this medication. This  medication may make you feel generally unwell. This is not uncommon, as chemotherapy can affect healthy cells as well as cancer cells. Report any side effects. Continue your course of treatment even though you feel ill unless your care team tells you to stop. In some cases, you may be given additional medications to help with side effects. Follow all directions for their use. This medication may increase your risk of getting an infection. Call your care team for advice if you get a fever, chills, sore throat, or other symptoms of a cold or flu. Do not treat yourself. Try to avoid being around people who are sick. Avoid taking medications that contain aspirin, acetaminophen, ibuprofen, naproxen, or ketoprofen unless instructed by your care team. These medications may hide a fever. Be careful brushing or flossing your teeth or using a toothpick because you may get an infection or bleed more easily. If you have any dental work done, tell your dentist you are receiving this medication. Talk to your care team if you wish to become pregnant or think you might be pregnant. This medication can cause serious birth defects. Talk to your care team about effective forms of contraception. Do not breast-feed while taking this medication. What side effects may I notice from receiving this medication? Side effects that you should report to your care team as soon as possible: Allergic reactions--skin rash, itching, hives, swelling of the face, lips, tongue, or throat Infection--fever, chills, cough, sore throat, wounds that don't heal, pain or trouble when passing urine, general feeling of discomfort or being unwell Low red blood cell level--unusual weakness or fatigue, dizziness, headache, trouble breathing Pain, tingling, or numbness in the hands or feet, muscle weakness, change in vision, confusion or trouble speaking, loss of balance or coordination, trouble walking, seizures Unusual bruising or bleeding Side  effects that usually do not require medical attention (report to your care team if they continue or are bothersome): Hair loss Nausea Unusual weakness or fatigue Vomiting This list may not describe all possible side effects. Call your doctor for medical advice about side effects. You may report side effects to FDA at 1-800-FDA-1088. Where should I keep my medication? This medication is given in a hospital or clinic. It will not be stored at home. NOTE: This sheet is a summary. It may not cover all possible information. If you have questions about this medicine, talk to your doctor, pharmacist, or health care provider.  2024 Elsevier/Gold Standard (2021-05-22 00:00:00)      

## 2022-12-06 NOTE — Assessment & Plan Note (Signed)
Chemotherapy plan as listed above 

## 2022-12-06 NOTE — Assessment & Plan Note (Signed)
Once she finished treatment of breast cancer, recommend risk-reducing salpingo oophorectomy  pancreatic screening at age 36 (or 71 years younger than the earliest exocrine pancreatic cancer diagnosis in the family, whichever is earlier)  Family members are encouraged to consider genetic testing for this familial pathogenic variant.

## 2022-12-06 NOTE — Assessment & Plan Note (Addendum)
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  Images and pathology results were reviewed and discussed with patient.  MRI showed  6.7 cm of abnormal enhancement, predominantly in the lower-inner inferior left breast, with the ultrasound-guided biopsy marking clip centrally positioned within the enhancement. Suspicious for involvement by cancer, cT3 disease.  Patient desires mastectomy. Patient has no desire of preserving fertility.  Currently on neoadjuvant chemotherapy with Carboplatin, taxol Keytruda followed by [facility] Q3 weeks   Proceed with C3 D1  carbo/taxol/Keytruda., . She will return in 1 week and 2 weeks for Lab Taxol, and D16-18 GCSF

## 2022-12-06 NOTE — Assessment & Plan Note (Signed)
Recommend potassium chloride 20meq daily 

## 2022-12-08 LAB — T4: T4, Total: 6.8 ug/dL (ref 4.5–12.0)

## 2022-12-09 ENCOUNTER — Encounter: Payer: Self-pay | Admitting: *Deleted

## 2022-12-09 ENCOUNTER — Encounter: Payer: Self-pay | Admitting: Surgery

## 2022-12-09 ENCOUNTER — Ambulatory Visit: Payer: Medicaid Other | Admitting: Surgery

## 2022-12-09 ENCOUNTER — Inpatient Hospital Stay: Payer: Medicaid Other

## 2022-12-09 VITALS — BP 110/69 | HR 101 | Temp 99.3°F | Ht 60.0 in | Wt 137.0 lb

## 2022-12-09 DIAGNOSIS — C50312 Malignant neoplasm of lower-inner quadrant of left female breast: Secondary | ICD-10-CM

## 2022-12-09 DIAGNOSIS — Z452 Encounter for adjustment and management of vascular access device: Secondary | ICD-10-CM | POA: Diagnosis not present

## 2022-12-09 DIAGNOSIS — Z95828 Presence of other vascular implants and grafts: Secondary | ICD-10-CM

## 2022-12-09 DIAGNOSIS — Z09 Encounter for follow-up examination after completed treatment for conditions other than malignant neoplasm: Secondary | ICD-10-CM

## 2022-12-09 MED ORDER — CEPHALEXIN 500 MG PO CAPS: 500.0000 mg | ORAL_CAPSULE | Freq: Two times a day (BID) | ORAL | 0 refills | Status: AC

## 2022-12-09 NOTE — Patient Instructions (Addendum)
You may shower. Just do not scrub at the area. Pat dry gently and redress.  Follow up here on November 4th 2024.  We have sent in a prescription for antibiotics that you may start. Please complete the full course.     Please call and ask to speak with a nurse if you develop questions or concerns.

## 2022-12-09 NOTE — Progress Notes (Signed)
[facility] Clinical Social Work  Clinical Social Work was referred by medical provider for assessment of psychosocial needs.  Clinical Social Worker contacted patient by phone to offer support and assess for needs.    Patient reported needing a letter and that Irving Shows, the Nurse Navigator, had taken care of it for her.  Patient stated she had no other needs at this time.   Dorothey Baseman, LCSW  Clinical Social Worker [facility]        Patient is participating in a Managed Medicaid Plan:  Yes

## 2022-12-09 NOTE — Progress Notes (Signed)
12/09/2022  HPI: Penny Gomez is a 37 y.o. female s/p right internal jugular Port-A-Cath placement on 10/17/2022.  Patient presents today because she has noticed over the last few weeks that a portion of the wound has opened and has been draining some clear fluid.  She denies any worsening pain but reports some swelling in the area.  She has been going through chemotherapy and reports that she is tolerating this well.  Denies any fevers or chills, chest pain, shortness of breath.  Vital signs: BP 110/69   Pulse (!) 101   Temp 99.3 F (37.4 C)   Ht 5' (1.524 m)   Wt 137 lb (62.1 kg)   LMP 11/17/2022   SpO2 97%   BMI 26.76 kg/m    Physical Exam: Constitutional: No acute distress Skin: Right upper chest Port-A-Cath site has a small area of dehiscence of about 5 mm in size.  There are some fibrinous material at the base but otherwise no purulence or necrosis.  There is no purulent drainage.  There is only some mild serous amount of fluid.  No significant erythema around the incision and no induration.  Silver nitrate was applied over this corner and the wound was dressed with dry gauze dressing.  Assessment/Plan: This is a 37 y.o. female s/p right internal jugular Port-A-Cath placement  -Discussed with patient that there is a very small area of dehiscence of about 5 mm in the medial corner of the wound.  Overall there is no evidence of true infection, purulence, or other complications.  However given that she is currently immunosuppressed with chemotherapy, I discussed with her as precaution, I will give her a 7-day course of Keflex. - I will see her in 1 week to reevaluate her wound.   Howie Ill, MD [facility]

## 2022-12-09 NOTE — Progress Notes (Signed)
Penny Gomez requested a letter she can give to food stamps stating she is currently under treatment.  Letter written and placed in registration for her to pick up later today.   Also discussed with our social worker Larita Fife.

## 2022-12-12 ENCOUNTER — Encounter: Payer: Self-pay | Admitting: Oncology

## 2022-12-13 ENCOUNTER — Inpatient Hospital Stay: Payer: Medicaid Other | Attending: Oncology

## 2022-12-13 ENCOUNTER — Inpatient Hospital Stay: Payer: Medicaid Other

## 2022-12-13 VITALS — BP 124/73 | HR 72 | Temp 97.5°F | Resp 16

## 2022-12-13 DIAGNOSIS — R509 Fever, unspecified: Secondary | ICD-10-CM | POA: Diagnosis not present

## 2022-12-13 DIAGNOSIS — Z5189 Encounter for other specified aftercare: Secondary | ICD-10-CM | POA: Insufficient documentation

## 2022-12-13 DIAGNOSIS — Z86711 Personal history of pulmonary embolism: Secondary | ICD-10-CM | POA: Diagnosis not present

## 2022-12-13 DIAGNOSIS — C50312 Malignant neoplasm of lower-inner quadrant of left female breast: Secondary | ICD-10-CM | POA: Insufficient documentation

## 2022-12-13 DIAGNOSIS — C50919 Malignant neoplasm of unspecified site of unspecified female breast: Secondary | ICD-10-CM

## 2022-12-13 DIAGNOSIS — Z7901 Long term (current) use of anticoagulants: Secondary | ICD-10-CM | POA: Diagnosis not present

## 2022-12-13 DIAGNOSIS — Z801 Family history of malignant neoplasm of trachea, bronchus and lung: Secondary | ICD-10-CM | POA: Diagnosis not present

## 2022-12-13 DIAGNOSIS — Z800 Family history of malignant neoplasm of digestive organs: Secondary | ICD-10-CM | POA: Diagnosis not present

## 2022-12-13 DIAGNOSIS — Z79899 Other long term (current) drug therapy: Secondary | ICD-10-CM | POA: Insufficient documentation

## 2022-12-13 DIAGNOSIS — Z803 Family history of malignant neoplasm of breast: Secondary | ICD-10-CM | POA: Diagnosis not present

## 2022-12-13 DIAGNOSIS — Z5111 Encounter for antineoplastic chemotherapy: Secondary | ICD-10-CM | POA: Diagnosis present

## 2022-12-13 LAB — CBC WITH DIFFERENTIAL (CANCER CENTER ONLY)
Abs Immature Granulocytes: 0.02 10*3/uL (ref 0.00–0.07)
Basophils Absolute: 0 10*3/uL (ref 0.0–0.1)
Basophils Relative: 0 %
Eosinophils Absolute: 0 10*3/uL (ref 0.0–0.5)
Eosinophils Relative: 0 %
HCT: 32.3 % — ABNORMAL LOW (ref 36.0–46.0)
Hemoglobin: 10.4 g/dL — ABNORMAL LOW (ref 12.0–15.0)
Immature Granulocytes: 1 %
Lymphocytes Relative: 53 %
Lymphs Abs: 1.7 10*3/uL (ref 0.7–4.0)
MCH: 27.1 pg (ref 26.0–34.0)
MCHC: 32.2 g/dL (ref 30.0–36.0)
MCV: 84.1 fL (ref 80.0–100.0)
Monocytes Absolute: 0.2 10*3/uL (ref 0.1–1.0)
Monocytes Relative: 5 %
Neutro Abs: 1.3 10*3/uL — ABNORMAL LOW (ref 1.7–7.7)
Neutrophils Relative %: 41 %
Platelet Count: 193 10*3/uL (ref 150–400)
RBC: 3.84 MIL/uL — ABNORMAL LOW (ref 3.87–5.11)
WBC Count: 3.2 10*3/uL — ABNORMAL LOW (ref 4.0–10.5)
nRBC: 0 % (ref 0.0–0.2)

## 2022-12-13 LAB — CMP (CANCER CENTER ONLY)
ALT: 19 U/L (ref 0–44)
AST: 18 U/L (ref 15–41)
Albumin: 3.9 g/dL (ref 3.5–5.0)
Alkaline Phosphatase: 81 U/L (ref 38–126)
Anion gap: 6 (ref 5–15)
BUN: 14 mg/dL (ref 6–20)
CO2: 24 mmol/L (ref 22–32)
Calcium: 9 mg/dL (ref 8.9–10.3)
Chloride: 105 mmol/L (ref 98–111)
Creatinine: 0.38 mg/dL — ABNORMAL LOW (ref 0.44–1.00)
GFR, Estimated: >60 mL/min (ref >60)
Glucose, Bld: 122 mg/dL — ABNORMAL HIGH (ref 70–99)
Potassium: 3.8 mmol/L (ref 3.5–5.1)
Sodium: 135 mmol/L (ref 135–145)
Total Bilirubin: 0.3 mg/dL (ref 0.3–1.2)
Total Protein: 7.1 g/dL (ref 6.5–8.1)

## 2022-12-13 MED ORDER — SODIUM CHLORIDE 0.9 % IV SOLN
80.0000 mg/m2 | Freq: Once | INTRAVENOUS | Status: AC
Start: 2022-12-13 — End: 2022-12-13
  Administered 2022-12-13: 126 mg via INTRAVENOUS
  Filled 2022-12-13: qty 21

## 2022-12-13 MED ORDER — DIPHENHYDRAMINE HCL 50 MG/ML IJ SOLN
50.0000 mg | Freq: Once | INTRAMUSCULAR | Status: AC
  Administered 2022-12-13: 50 mg via INTRAVENOUS
  Filled 2022-12-13: qty 1

## 2022-12-13 MED ORDER — DEXAMETHASONE SODIUM PHOSPHATE 10 MG/ML IJ SOLN
10.0000 mg | Freq: Once | INTRAMUSCULAR | Status: AC
  Administered 2022-12-13: 10 mg via INTRAVENOUS
  Filled 2022-12-13: qty 1

## 2022-12-13 MED ORDER — SODIUM CHLORIDE 0.9 % IV SOLN
Freq: Once | INTRAVENOUS | Status: AC
  Filled 2022-12-13: qty 250

## 2022-12-13 MED ORDER — FAMOTIDINE IN NACL 20-0.9 MG/50ML-% IV SOLN
20.0000 mg | Freq: Once | INTRAVENOUS | Status: AC
  Administered 2022-12-13: 20 mg via INTRAVENOUS
  Filled 2022-12-13: qty 50

## 2022-12-13 NOTE — Patient Instructions (Signed)
[facility] [place] [facility]  Discharge Instructions: Thank you for choosing [facility] to provide your oncology and hematology care.  If you have a lab appointment with the Cancer Center, please go directly to the Cancer Center and check in at the registration area.  Wear comfortable clothing and clothing appropriate for easy access to any Portacath or PICC line.   We strive to give you quality time with your provider. You may need to reschedule your appointment if you arrive late (15 or more minutes).  Arriving late affects you and other patients whose appointments are after yours.  Also, if you miss three or more appointments without notifying the office, you may be dismissed from the clinic at the provider's discretion.      For prescription refill requests, have your pharmacy contact our office and allow 72 hours for refills to be completed.    Today you received the following chemotherapy and/or immunotherapy agents Taxol        To help prevent nausea and vomiting after your treatment, we encourage you to take your nausea medication as directed.  BELOW ARE SYMPTOMS THAT SHOULD BE REPORTED IMMEDIATELY: *FEVER GREATER THAN 100.4 F (38 C) OR HIGHER *CHILLS OR SWEATING *NAUSEA AND VOMITING THAT IS NOT CONTROLLED WITH YOUR NAUSEA MEDICATION *UNUSUAL SHORTNESS OF BREATH *UNUSUAL BRUISING OR BLEEDING *URINARY PROBLEMS (pain or burning when urinating, or frequent urination) *BOWEL PROBLEMS (unusual diarrhea, constipation, pain near the anus) TENDERNESS IN MOUTH AND THROAT WITH OR WITHOUT PRESENCE OF ULCERS (sore throat, sores in mouth, or a toothache) UNUSUAL RASH, SWELLING OR PAIN  UNUSUAL VAGINAL DISCHARGE OR ITCHING   Items with * indicate a potential emergency and should be followed up as soon as possible or go to the Emergency Department if any problems should occur.  Please show the CHEMOTHERAPY ALERT CARD or IMMUNOTHERAPY ALERT CARD at check-in to  the Emergency Department and triage nurse.  Should you have questions after your visit or need to cancel or reschedule your appointment, please contact [facility] [place] [facility]  [phone] and follow the prompts.  Office hours are 8:00 a.m. to 4:30 p.m. Monday - Friday. Please note that voicemails left after 4:00 p.m. may not be returned until the following business day.  We are closed weekends and major holidays. You have access to a nurse at all times for urgent questions. Please call the main number to the clinic [phone] and follow the prompts.  For any non-urgent questions, you may also contact your provider using MyChart. We now offer e-Visits for anyone 18 and older to request care online for non-urgent symptoms. For details visit [email or web address].   Also download the MyChart app! Go to the app store, search "MyChart", open the app, select [place], and log in with your MyChart username and password.    

## 2022-12-16 ENCOUNTER — Ambulatory Visit: Payer: Medicaid Other | Admitting: Surgery

## 2022-12-16 ENCOUNTER — Encounter: Payer: Self-pay | Admitting: Surgery

## 2022-12-16 ENCOUNTER — Encounter: Payer: Self-pay | Admitting: Oncology

## 2022-12-16 VITALS — BP 124/78 | HR 92 | Temp 98.2°F | Ht 60.0 in | Wt 137.0 lb

## 2022-12-16 DIAGNOSIS — T8131XA Disruption of external operation (surgical) wound, not elsewhere classified, initial encounter: Secondary | ICD-10-CM

## 2022-12-16 DIAGNOSIS — Z452 Encounter for adjustment and management of vascular access device: Secondary | ICD-10-CM

## 2022-12-16 DIAGNOSIS — Z09 Encounter for follow-up examination after completed treatment for conditions other than malignant neoplasm: Secondary | ICD-10-CM

## 2022-12-16 DIAGNOSIS — Z95828 Presence of other vascular implants and grafts: Secondary | ICD-10-CM

## 2022-12-16 NOTE — Patient Instructions (Signed)
Follow up here in Friday for [place] to look at the area again.   Please call and ask to speak with a nurse if you develop questions or concerns.

## 2022-12-16 NOTE — Progress Notes (Signed)
12/16/2022  HPI: Penny Gomez is a 37 y.o. female s/p right internal jugular port-a-cath placement on 10/17/22.  Patient was last seen on 12/09/22 due to a small 5 mm dehiscence at the medial corner of the wound.  As a precaution, she was given Keflex rx and dry gauze dressing.  Today, she reports that the wound is more open and still having drainage.  Denies any worsening pain and denies any visible portion of the port.  Vital signs: BP 124/78   Pulse 92   Temp 98.2 F (36.8 C)   Ht 5' (1.524 m)   Wt 137 lb (62.1 kg)   LMP 11/17/2022   SpO2 97%   BMI 26.76 kg/m    Physical Exam: Constitutional:  No acute distress Skin:  Right chest port site has an opening of about 7 mm opening that is about 5 mm from the medial corner.  The medial corner itself appears to be healing well, but there is dehiscence just lateral to it of 7 mm length.  Serous drainage only is noted.  Applied steri strips over this area to keep the skin edges closer together to promote more healing.  Dry gauze dressing applied.  Assessment/Plan: This is a 37 y.o. female s/p right internal jugular port-a-cath placement, with partial wound dehiscence.  --Discussed with the patient that there is an open area which appears to be different compared to her last visit, just more lateral compared to the initial corner seen.  No part of the port is visible, and there's no evidence of infection. Applied steri strips to help bring the edges closer together without sealing them to prevent any secondary infection.  Instructed on dry gauze dressings. --Patient will follow up with me on 11/8 to check on her wound again.   Howie Ill, MD [facility]

## 2022-12-17 ENCOUNTER — Other Ambulatory Visit: Payer: Self-pay

## 2022-12-17 ENCOUNTER — Encounter: Payer: Self-pay | Admitting: Oncology

## 2022-12-17 MED ORDER — APIXABAN 5 MG PO TABS: 5.0000 mg | ORAL_TABLET | Freq: Two times a day (BID) | ORAL | 1 refills | Status: DC

## 2022-12-20 ENCOUNTER — Inpatient Hospital Stay: Payer: Medicaid Other

## 2022-12-20 ENCOUNTER — Encounter: Payer: Self-pay | Admitting: Surgery

## 2022-12-20 ENCOUNTER — Ambulatory Visit (INDEPENDENT_AMBULATORY_CARE_PROVIDER_SITE_OTHER): Payer: Medicaid Other | Admitting: Surgery

## 2022-12-20 ENCOUNTER — Ambulatory Visit: Payer: Medicaid Other | Admitting: Oncology

## 2022-12-20 VITALS — BP 126/82 | HR 81 | Temp 96.6°F | Resp 18

## 2022-12-20 VITALS — BP 120/85 | HR 76 | Temp 98.0°F | Ht 60.0 in | Wt 141.0 lb

## 2022-12-20 DIAGNOSIS — T8131XD Disruption of external operation (surgical) wound, not elsewhere classified, subsequent encounter: Secondary | ICD-10-CM

## 2022-12-20 DIAGNOSIS — Z5111 Encounter for antineoplastic chemotherapy: Secondary | ICD-10-CM | POA: Diagnosis not present

## 2022-12-20 DIAGNOSIS — Z95828 Presence of other vascular implants and grafts: Secondary | ICD-10-CM

## 2022-12-20 DIAGNOSIS — C50919 Malignant neoplasm of unspecified site of unspecified female breast: Secondary | ICD-10-CM

## 2022-12-20 DIAGNOSIS — T8131XA Disruption of external operation (surgical) wound, not elsewhere classified, initial encounter: Secondary | ICD-10-CM

## 2022-12-20 LAB — CBC WITH DIFFERENTIAL (CANCER CENTER ONLY)
Abs Immature Granulocytes: 0.03 10*3/uL (ref 0.00–0.07)
Basophils Absolute: 0 10*3/uL (ref 0.0–0.1)
Basophils Relative: 1 %
Eosinophils Absolute: 0 10*3/uL (ref 0.0–0.5)
Eosinophils Relative: 1 %
HCT: 32.7 % — ABNORMAL LOW (ref 36.0–46.0)
Hemoglobin: 10.5 g/dL — ABNORMAL LOW (ref 12.0–15.0)
Immature Granulocytes: 1 %
Lymphocytes Relative: 42 %
Lymphs Abs: 1.8 10*3/uL (ref 0.7–4.0)
MCH: 28.1 pg (ref 26.0–34.0)
MCHC: 32.1 g/dL (ref 30.0–36.0)
MCV: 87.4 fL (ref 80.0–100.0)
Monocytes Absolute: 0.4 10*3/uL (ref 0.1–1.0)
Monocytes Relative: 11 %
Neutro Abs: 1.8 10*3/uL (ref 1.7–7.7)
Neutrophils Relative %: 44 %
Platelet Count: 329 10*3/uL (ref 150–400)
RBC: 3.74 MIL/uL — ABNORMAL LOW (ref 3.87–5.11)
Smear Review: NORMAL
WBC Count: 4.2 10*3/uL (ref 4.0–10.5)
nRBC: 0 % (ref 0.0–0.2)

## 2022-12-20 LAB — CMP (CANCER CENTER ONLY)
ALT: 19 U/L (ref 0–44)
AST: 17 U/L (ref 15–41)
Albumin: 3.8 g/dL (ref 3.5–5.0)
Alkaline Phosphatase: 73 U/L (ref 38–126)
Anion gap: 3 — ABNORMAL LOW (ref 5–15)
BUN: 17 mg/dL (ref 6–20)
CO2: 27 mmol/L (ref 22–32)
Calcium: 9 mg/dL (ref 8.9–10.3)
Chloride: 108 mmol/L (ref 98–111)
Creatinine: 0.53 mg/dL (ref 0.44–1.00)
GFR, Estimated: >60 mL/min (ref >60)
Glucose, Bld: 94 mg/dL (ref 70–99)
Potassium: 4.2 mmol/L (ref 3.5–5.1)
Sodium: 138 mmol/L (ref 135–145)
Total Bilirubin: 0.2 mg/dL (ref <1.2)
Total Protein: 7 g/dL (ref 6.5–8.1)

## 2022-12-20 MED ORDER — SODIUM CHLORIDE 0.9% FLUSH
10.0000 mL | INTRAVENOUS | Status: DC | PRN
  Administered 2022-12-20: 10 mL
  Filled 2022-12-20: qty 10

## 2022-12-20 MED ORDER — SODIUM CHLORIDE 0.9 % IV SOLN
80.0000 mg/m2 | Freq: Once | INTRAVENOUS | Status: AC
  Administered 2022-12-20: 126 mg via INTRAVENOUS
  Filled 2022-12-20: qty 21

## 2022-12-20 MED ORDER — DIPHENHYDRAMINE HCL 50 MG/ML IJ SOLN
50.0000 mg | Freq: Once | INTRAMUSCULAR | Status: AC
  Administered 2022-12-20: 50 mg via INTRAVENOUS
  Filled 2022-12-20: qty 1

## 2022-12-20 MED ORDER — DEXAMETHASONE SODIUM PHOSPHATE 10 MG/ML IJ SOLN
10.0000 mg | Freq: Once | INTRAMUSCULAR | Status: AC
  Administered 2022-12-20: 10 mg via INTRAVENOUS
  Filled 2022-12-20: qty 1

## 2022-12-20 MED ORDER — SODIUM CHLORIDE 0.9 % IV SOLN
Freq: Once | INTRAVENOUS | Status: AC
  Filled 2022-12-20: qty 250

## 2022-12-20 MED ORDER — FAMOTIDINE IN NACL 20-0.9 MG/50ML-% IV SOLN
20.0000 mg | Freq: Once | INTRAVENOUS | Status: AC
  Administered 2022-12-20: 20 mg via INTRAVENOUS
  Filled 2022-12-20: qty 50

## 2022-12-20 MED ORDER — HEPARIN SOD (PORK) LOCK FLUSH 100 UNIT/ML IV SOLN
500.0000 [IU] | Freq: Once | INTRAVENOUS | Status: AC | PRN
Start: 2022-12-20 — End: 2022-12-20
  Administered 2022-12-20: 500 [IU]
  Filled 2022-12-20: qty 5

## 2022-12-20 MED ORDER — ALTEPLASE 2 MG IJ SOLR
2.0000 mg | Freq: Once | INTRAMUSCULAR | Status: AC | PRN
  Administered 2022-12-20: 2 mg
  Filled 2022-12-20: qty 2

## 2022-12-20 NOTE — Patient Instructions (Signed)
Continue to keep a clean dry gauze over the area. The would should continue to close up on its own.  We are fine with Oncology using your port today.    We will have you follow up here in 2 weeks.     Please call and ask to speak with a nurse if you develop questions or concerns.

## 2022-12-20 NOTE — Patient Instructions (Signed)
[facility]  Discharge Instructions: Thank you for choosing [facility] to provide your oncology and hematology care.  If you have a lab appointment with the Cancer Center, please go directly to the Cancer Center and check in at the registration area.  Wear comfortable clothing and clothing appropriate for easy access to any Portacath or PICC line.   We strive to give you quality time with your provider. You may need to reschedule your appointment if you arrive late (15 or more minutes).  Arriving late affects you and other patients whose appointments are after yours.  Also, if you miss three or more appointments without notifying the office, you may be dismissed from the clinic at the provider's discretion.      For prescription refill requests, have your pharmacy contact our office and allow 72 hours for refills to be completed.    Today you received the following chemotherapy and/or immunotherapy agents- taxol      To help prevent nausea and vomiting after your treatment, we encourage you to take your nausea medication as directed.  BELOW ARE SYMPTOMS THAT SHOULD BE REPORTED IMMEDIATELY: *FEVER GREATER THAN 100.4 F (38 C) OR HIGHER *CHILLS OR SWEATING *NAUSEA AND VOMITING THAT IS NOT CONTROLLED WITH YOUR NAUSEA MEDICATION *UNUSUAL SHORTNESS OF BREATH *UNUSUAL BRUISING OR BLEEDING *URINARY PROBLEMS (pain or burning when urinating, or frequent urination) *BOWEL PROBLEMS (unusual diarrhea, constipation, pain near the anus) TENDERNESS IN MOUTH AND THROAT WITH OR WITHOUT PRESENCE OF ULCERS (sore throat, sores in mouth, or a toothache) UNUSUAL RASH, SWELLING OR PAIN  UNUSUAL VAGINAL DISCHARGE OR ITCHING   Items with * indicate a potential emergency and should be followed up as soon as possible or go to the Emergency Department if any problems should occur.  Please show the CHEMOTHERAPY ALERT CARD or IMMUNOTHERAPY ALERT  CARD at check-in to the Emergency Department and triage nurse.  Should you have questions after your visit or need to cancel or reschedule your appointment, please contact [facility]  [phone] and follow the prompts.  Office hours are 8:00 a.m. to 4:30 p.m. Monday - Friday. Please note that voicemails left after 4:00 p.m. may not be returned until the following business day.  We are closed weekends and major holidays. You have access to a nurse at all times for urgent questions. Please call the main number to the clinic [phone] and follow the prompts.  For any non-urgent questions, you may also contact your provider using MyChart. We now offer e-Visits for anyone 38 and older to request care online for non-urgent symptoms. For details visit [email or web address].   Also download the MyChart app! Go to the app store, search "MyChart", open the app, select [place], and log in with your MyChart username and password.

## 2022-12-20 NOTE — Progress Notes (Signed)
12/20/2022  HPI: Penny Gomez is a 37 y.o. female s/p right internal jugular port-a-cath placement on 10/17/22.  Patient was seen on 12/09/22 due to a small 5 mm dehiscence at the medial corner of the wound.  As a precaution, she was given Keflex rx and dry gauze dressing.  She was last seen on 12/16/22 at which point she reported that the wound had opened up more and was having drainage.  She had a 7 mm area of dehiscence lateral to the medial corner, with subcutaneous tissue exposed, but no port visible.  She presents today for closer follow up.  Today, she reports that the wound is stable in size and drainage.  Denies any worsening pain, erythema, or purulence.  Vital signs: BP 120/85   Pulse 76   Temp 98 F (36.7 C)   Ht 5' (1.524 m)   Wt 141 lb (64 kg)   LMP 11/17/2022   SpO2 98%   BMI 27.54 kg/m    Physical Exam: Constitutional:  No acute distress Skin:  Right chest port site has an opening of about 7 mm opening that is about 5 mm from the medial corner.  The subcutaneous tissue exposed is sealing better, without any gap noticeable.  No port components visible.  Serous drainage only is noted. Dry gauze dressing applied.  Assessment/Plan: This is a 37 y.o. female s/p right internal jugular port-a-cath placement, with partial wound dehiscence.  --Overall the port wound dehiscence is stable compared to 11/4 appointment.  The subcutaneous tissue appears to be healing better, and there is no port component exposed.  Discussed that she can continue doing dry gauze dressings.  No evidence of infection at this point.  Can continue using the port, just need to be careful with the incision itself. --Patient will follow up with me on 11/15 to check on her wound again.   Howie Ill, MD [facility]

## 2022-12-23 ENCOUNTER — Inpatient Hospital Stay: Payer: Medicaid Other

## 2022-12-23 DIAGNOSIS — C50919 Malignant neoplasm of unspecified site of unspecified female breast: Secondary | ICD-10-CM

## 2022-12-23 DIAGNOSIS — Z5111 Encounter for antineoplastic chemotherapy: Secondary | ICD-10-CM | POA: Diagnosis not present

## 2022-12-23 MED ORDER — FILGRASTIM-SNDZ 480 MCG/0.8ML IJ SOSY
480.0000 ug | PREFILLED_SYRINGE | Freq: Once | INTRAMUSCULAR | Status: AC
  Administered 2022-12-23: 480 ug via SUBCUTANEOUS
  Filled 2022-12-23: qty 0.8

## 2022-12-24 ENCOUNTER — Encounter: Payer: Self-pay | Admitting: Oncology

## 2022-12-24 ENCOUNTER — Inpatient Hospital Stay: Payer: Medicaid Other

## 2022-12-24 DIAGNOSIS — Z5111 Encounter for antineoplastic chemotherapy: Secondary | ICD-10-CM | POA: Diagnosis not present

## 2022-12-24 DIAGNOSIS — C50919 Malignant neoplasm of unspecified site of unspecified female breast: Secondary | ICD-10-CM

## 2022-12-24 MED ORDER — FILGRASTIM-SNDZ 480 MCG/0.8ML IJ SOSY
480.0000 ug | PREFILLED_SYRINGE | Freq: Once | INTRAMUSCULAR | Status: AC
  Administered 2022-12-24: 480 ug via SUBCUTANEOUS
  Filled 2022-12-24: qty 0.8

## 2022-12-25 ENCOUNTER — Inpatient Hospital Stay: Payer: Medicaid Other

## 2022-12-25 VITALS — BP 110/72

## 2022-12-25 DIAGNOSIS — C50919 Malignant neoplasm of unspecified site of unspecified female breast: Secondary | ICD-10-CM

## 2022-12-25 DIAGNOSIS — Z5111 Encounter for antineoplastic chemotherapy: Secondary | ICD-10-CM | POA: Diagnosis not present

## 2022-12-25 MED ORDER — FILGRASTIM-SNDZ 480 MCG/0.8ML IJ SOSY
480.0000 ug | PREFILLED_SYRINGE | Freq: Once | INTRAMUSCULAR | Status: AC
  Administered 2022-12-25: 480 ug via SUBCUTANEOUS
  Filled 2022-12-25: qty 0.8

## 2022-12-27 ENCOUNTER — Encounter: Payer: Self-pay | Admitting: Surgery

## 2022-12-27 ENCOUNTER — Ambulatory Visit (INDEPENDENT_AMBULATORY_CARE_PROVIDER_SITE_OTHER): Payer: Medicaid Other | Admitting: Surgery

## 2022-12-27 VITALS — BP 118/82 | HR 93 | Temp 98.7°F | Ht 60.0 in | Wt 142.2 lb

## 2022-12-27 DIAGNOSIS — Z452 Encounter for adjustment and management of vascular access device: Secondary | ICD-10-CM | POA: Diagnosis not present

## 2022-12-27 DIAGNOSIS — T8131XA Disruption of external operation (surgical) wound, not elsewhere classified, initial encounter: Secondary | ICD-10-CM

## 2022-12-27 DIAGNOSIS — T8131XD Disruption of external operation (surgical) wound, not elsewhere classified, subsequent encounter: Secondary | ICD-10-CM | POA: Diagnosis not present

## 2022-12-27 DIAGNOSIS — Z95828 Presence of other vascular implants and grafts: Secondary | ICD-10-CM

## 2022-12-27 MED FILL — Fosaprepitant Dimeglumine For IV Infusion 150 MG (Base Eq): INTRAVENOUS | Qty: 5 | Status: AC

## 2022-12-27 NOTE — Patient Instructions (Signed)
See follow up appointment.   Implanted [facility] Guide An implanted port is a device that is placed under the skin. It is usually placed in the chest. The device may vary based on the need. Implanted ports can be used to give IV medicine, to take blood, or to give fluids. You may have an implanted port if: You need IV medicine that would be irritating to the small veins in your hands or arms. You need IV medicines, such as chemotherapy, for a long period of time. You need IV nutrition for a long period of time. You may have fewer limitations when using a port than you would if you used other types of long-term IVs. You will also likely be able to return to normal activities after your incision heals. An implanted port has two main parts: Reservoir. The reservoir is the part where a needle is inserted to give medicines or draw blood. The reservoir is round. After the port is placed, it appears as a small, raised area under your skin. Catheter. The catheter is a small, thin tube that connects the reservoir to a vein. Medicine that is inserted into the reservoir goes into the catheter and then into the vein. How is my port accessed? To access your port: A numbing cream may be placed on the skin over the port site. Your health care provider will put on a mask and sterile gloves. The skin over your port will be cleaned carefully with a germ-killing soap and allowed to dry. Your health care provider will gently pinch the port and insert a needle into it. Your health care provider will check for a blood return to make sure the port is in the vein and is still working (patent). If your port needs to remain accessed to get medicine continuously (constant infusion), your health care provider will place a clear bandage (dressing) over the needle site. The dressing and needle will need to be changed every week, or as told by your health care provider. What is flushing? Flushing helps keep the port working.  Follow instructions from your health care provider about how and when to flush the port. Ports are usually flushed with saline solution or a medicine called heparin. The need for flushing will depend on how the port is used: If the port is only used from time to time to give medicines or draw blood, the port may need to be flushed: Before and after medicines have been given. Before and after blood has been drawn. As part of routine maintenance. Flushing may be recommended every 4-6 weeks. If a constant infusion is running, the port may not need to be flushed. Throw away any syringes in a disposal container that is meant for sharp items (sharps container). You can buy a sharps container from a pharmacy, or you can make one by using an empty hard plastic bottle with a cover. How long will my port stay implanted? The port can stay in for as long as your health care provider thinks it is needed. When it is time for the port to come out, a surgery will be done to remove it. The surgery will be similar to the procedure that was done to put the port in. Follow these instructions at home: Caring for your port and port site Flush your port as told by your health care provider. If you need an infusion over several days, follow instructions from your health care provider about how to take care of your port  site. Make sure you: Change your dressing as told by your health care provider. Wash your hands with soap and water for at least 20 seconds before and after you change your dressing. If soap and water are not available, use alcohol-based hand sanitizer. Place any used dressings or infusion bags into a plastic bag. Throw that bag in the trash. Keep the dressing that covers the needle clean and dry. Do not get it wet. Do not use scissors or sharp objects near the infusion tubing. Keep any external tubes clamped, unless they are being used. Check your port site every day for signs of infection. Check  for: Redness, swelling, or pain. Fluid or blood. Warmth. Pus or a bad smell. Protect the skin around the port site. Avoid wearing bra straps that rub or irritate the site. Protect the skin around your port from seat belts. Place a soft pad over your chest if needed. Bathe or shower as told by your health care provider. The site may get wet as long as you are not actively receiving an infusion. General instructions  Return to your normal activities as told by your health care provider. Ask your health care provider what activities are safe for you. Carry a medical alert card or wear a medical alert bracelet at all times. This will let health care providers know that you have an implanted port in case of an emergency. Where to find more information American Cancer Society: www.cancer.org American Society of Clinical Oncology: www.cancer.net Contact a health care provider if: You have a fever or chills. You have redness, swelling, or pain at the port site. You have fluid or blood coming from your port site. Your incision feels warm to the touch. You have pus or a bad smell coming from the port site. Summary Implanted ports are usually placed in the chest for long-term IV access. Follow instructions from your health care provider about flushing the port and changing bandages (dressings). Take care of the area around your port by avoiding clothing that puts pressure on the area, and by watching for signs of infection. Protect the skin around your port from seat belts. Place a soft pad over your chest if needed. Contact a health care provider if you have a fever or you have redness, swelling, pain, fluid, or a bad smell at the port site. This information is not intended to replace advice given to you by your health care provider. Make sure you discuss any questions you have with your health care provider. Document Revised: 08/01/2020 Document Reviewed: 08/01/2020 Elsevier Patient Education   2024 [employer].

## 2022-12-27 NOTE — Progress Notes (Signed)
12/27/2022  HPI: Penny Gomez is a 37 y.o. female s/p right internal jugular port-a-cath placement on 10/17/22.  Patient has been following with me for a partial port incision dehiscence.  Overall she reports that she's still having serous drainage, and the wound has been stable.  She reports some soreness in her neck and is wondering if the area where the catheter is tunneled is more swollen.  She needed TPA for the port last week but it's otherwise working.  Vital signs: BP 118/82 (BP Location: Left Arm, Patient Position: Sitting, Cuff Size: Small)   Pulse 93   Temp 98.7 F (37.1 C) (Oral)   Ht 5' (1.524 m)   Wt 142 lb 3.2 oz (64.5 kg)   SpO2 98%   BMI 27.77 kg/m    Physical Exam: Constitutional: No acute distress Skin:  The port incision has a stable 7 mm area of dehiscence with subcutaneous tissue exposed.  No deeper gap noticeable, and no components of the port are exposed.  Applied silver nitrate to the wound bed.  No evidence of infection.  Dry gauze dressing applied.  The catheter portion is palpable under the skin tracking to the internal jugular vein, but I do not appreciate any kinks in the catheter.  Assessment/Plan: This is a 37 y.o. female s/p right internal jugular port-a-cath placement.  --Her wound has remained stable, without any further dehiscence, and still remains without any sign of infection.  Applied silver nitrate to the wound bed to help it seal better.  Dry gauze dressing applied. --I do not appreciate any kinks in the catheter, but if there is any trouble with it, we could get a CXR in the future if needed. --Follow up with me on 01/08/23 for another wound check.   Howie Ill, MD [facility]

## 2022-12-30 ENCOUNTER — Inpatient Hospital Stay: Payer: Medicaid Other

## 2022-12-30 ENCOUNTER — Encounter: Payer: Self-pay | Admitting: Oncology

## 2022-12-30 ENCOUNTER — Inpatient Hospital Stay (HOSPITAL_BASED_OUTPATIENT_CLINIC_OR_DEPARTMENT_OTHER): Payer: Medicaid Other | Admitting: Oncology

## 2022-12-30 ENCOUNTER — Ambulatory Visit
Admission: RE | Admit: 2022-12-30 | Discharge: 2022-12-30 | Disposition: A | Discharge status: Home / Self Care | Payer: Medicaid Other | Source: Ambulatory Visit | Attending: Oncology | Admitting: Oncology

## 2022-12-30 VITALS — BP 116/88 | HR 148 | Temp 101.2°F | Resp 18 | Wt 141.7 lb

## 2022-12-30 DIAGNOSIS — R0989 Other specified symptoms and signs involving the circulatory and respiratory systems: Secondary | ICD-10-CM

## 2022-12-30 DIAGNOSIS — R509 Fever, unspecified: Secondary | ICD-10-CM | POA: Diagnosis not present

## 2022-12-30 DIAGNOSIS — C50919 Malignant neoplasm of unspecified site of unspecified female breast: Secondary | ICD-10-CM

## 2022-12-30 DIAGNOSIS — Z5111 Encounter for antineoplastic chemotherapy: Secondary | ICD-10-CM | POA: Diagnosis not present

## 2022-12-30 DIAGNOSIS — I2699 Other pulmonary embolism without acute cor pulmonale: Secondary | ICD-10-CM

## 2022-12-30 LAB — URINALYSIS, COMPLETE (UACMP) WITH MICROSCOPIC
Bilirubin Urine: NEGATIVE
Glucose, UA: NEGATIVE mg/dL
Ketones, ur: NEGATIVE mg/dL
Leukocytes,Ua: NEGATIVE
Nitrite: NEGATIVE
Protein, ur: NEGATIVE mg/dL
Specific Gravity, Urine: 1.005 (ref 1.005–1.030)
pH: 5 (ref 5.0–8.0)

## 2022-12-30 LAB — CBC WITH DIFFERENTIAL (CANCER CENTER ONLY)
Abs Immature Granulocytes: 0.54 10*3/uL — ABNORMAL HIGH (ref 0.00–0.07)
Basophils Absolute: 0 10*3/uL (ref 0.0–0.1)
Basophils Relative: 0 %
Eosinophils Absolute: 0 10*3/uL (ref 0.0–0.5)
Eosinophils Relative: 0 %
HCT: 36.1 % (ref 36.0–46.0)
Hemoglobin: 11.6 g/dL — ABNORMAL LOW (ref 12.0–15.0)
Immature Granulocytes: 5 %
Lymphocytes Relative: 8 %
Lymphs Abs: 0.9 10*3/uL (ref 0.7–4.0)
MCH: 28.6 pg (ref 26.0–34.0)
MCHC: 32.1 g/dL (ref 30.0–36.0)
MCV: 89.1 fL (ref 80.0–100.0)
Monocytes Absolute: 0.9 10*3/uL (ref 0.1–1.0)
Monocytes Relative: 9 %
Neutro Abs: 8 10*3/uL — ABNORMAL HIGH (ref 1.7–7.7)
Neutrophils Relative %: 78 %
Platelet Count: 184 10*3/uL (ref 150–400)
RBC: 4.05 MIL/uL (ref 3.87–5.11)
Smear Review: NORMAL
WBC Count: 10.4 10*3/uL (ref 4.0–10.5)
nRBC: 0 % (ref 0.0–0.2)

## 2022-12-30 LAB — RESPIRATORY PANEL BY PCR

## 2022-12-30 LAB — CMP (CANCER CENTER ONLY)
ALT: 33 U/L (ref 0–44)
AST: 32 U/L (ref 15–41)
Albumin: 3.9 g/dL (ref 3.5–5.0)
Alkaline Phosphatase: 115 U/L (ref 38–126)
Anion gap: 12 (ref 5–15)
BUN: 9 mg/dL (ref 6–20)
CO2: 22 mmol/L (ref 22–32)
Calcium: 8.8 mg/dL — ABNORMAL LOW (ref 8.9–10.3)
Chloride: 101 mmol/L (ref 98–111)
Creatinine: 0.85 mg/dL (ref 0.44–1.00)
GFR, Estimated: >60 mL/min (ref >60)
Glucose, Bld: 137 mg/dL — ABNORMAL HIGH (ref 70–99)
Potassium: 3.6 mmol/L (ref 3.5–5.1)
Sodium: 135 mmol/L (ref 135–145)
Total Bilirubin: 0.3 mg/dL (ref <1.2)
Total Protein: 7.6 g/dL (ref 6.5–8.1)

## 2022-12-30 LAB — PREGNANCY, URINE: Preg Test, Ur: NEGATIVE

## 2022-12-30 MED ORDER — AMOXICILLIN-POT CLAVULANATE 875-125 MG PO TABS: 1.0000 | ORAL_TABLET | Freq: Two times a day (BID) | ORAL | 0 refills | Status: DC

## 2022-12-30 MED ORDER — HEPARIN SOD (PORK) LOCK FLUSH 100 UNIT/ML IV SOLN
500.0000 [IU] | Freq: Once | INTRAVENOUS | Status: AC
  Administered 2022-12-30: 500 [IU] via INTRAVENOUS
  Filled 2022-12-30: qty 5

## 2022-12-30 MED ORDER — SODIUM CHLORIDE 0.9% FLUSH
10.0000 mL | Freq: Once | INTRAVENOUS | Status: AC
  Administered 2022-12-30: 10 mL via INTRAVENOUS
  Filled 2022-12-30: qty 10

## 2022-12-30 NOTE — Progress Notes (Addendum)
Hematology/Oncology Progress note [phone] [phone]       CHIEF COMPLAINTS/PURPOSE OF CONSULTATION:  Left triple negative breast cancer.   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  Images and pathology results were reviewed and discussed with patient.  MRI showed  6.7 cm of abnormal enhancement, predominantly in the lower-inner inferior left breast, with the ultrasound-guided biopsy marking clip centrally positioned within the enhancement. Suspicious for involvement by cancer, cT3 disease.  Patient desires mastectomy. Patient has no desire of preserving fertility.  Currently on neoadjuvant chemotherapy with Carboplatin, taxol Keytruda followed by [facility] Q3 weeks  Hold off chemotherapy due to acute febrile illness.     Acute febrile illness Check blood culture, CXR, UA urine culture, respiratory panel.   -Available results were reviewed.  Negative respiratory panel.  UA is positive for hemoglobin.  Bacteria.  Urine culture is pending. I will cover patient empirically with 5 days course of Augmentin.  I called patient and sent prescription.   Acute pulmonary embolism (HCC) Continue Eliquis 5mg  BID   Orders Placed This Encounter  Procedures   Respiratory (~20 pathogens) panel by PCR    Standing Status:   Future    Standing Expiration Date:   12/30/2023   Urine Culture    Standing Status:   Future    Number of Occurrences:   1    Standing Expiration Date:   12/30/2023   Culture, blood (routine x 2)    Standing Status:   Future    Standing Expiration Date:   12/30/2023   Culture, blood (routine x 2)    Standing Status:   Future    Standing Expiration Date:   12/30/2023   DG Chest 2  View    Standing Status:   Future    Standing Expiration Date:   12/30/2023    Order Specific Question:   Reason for Exam (SYMPTOM  OR DIAGNOSIS REQUIRED)    Answer:   fever, congestion    Order Specific Question:   Is patient pregnant?    Answer:   No    Order Specific Question:   Preferred imaging location?    Answer:   [facility]   Urinalysis, Complete w Microscopic    Standing Status:   Future    Number of Occurrences:   1    Standing Expiration Date:   12/30/2023   Follow-up per LOS  All questions were answered. The patient knows to call the clinic with any problems, questions or concerns.  [name], MD, PhD [facility] Hematology Oncology 12/30/2022    HISTORY OF PRESENTING ILLNESS:  Penny Gomez 37 y.o. female presents to establish care for left triple negative breast cancer I have reviewed her chart and materials related to her cancer extensively and collaborated history with the patient. Summary of oncologic history is as follows: Oncology History  Invasive carcinoma of breast (HCC)  07/24/2022 Mammogram   She noticed left breast mass for 1 month.  Bilateral diagnostic mammogram showed Suspicious palpable left breast mass 8 o'clock position. Cortically thickened left axillary lymph node   10/10/2022 Initial Diagnosis   Invasive carcinoma of breast (HCC)  10/02/2022  Left breast mass biopsy and left axillary lymph node  biopsy.  Diagnosis 1. Breast, left, needle core biopsy, 8 o'clock 6 cmfn, heart clip - INVASIVE MAMMARY CARCINOMA, NO SPECIAL TYPE. - TUBULE FORMATION: SCORE 3 - NUCLEAR PLEOMORPHISM: SCORE 3 - MITOTIC COUNT: SCORE 3 - TOTAL SCORE: 9 - OVERALL GRADE: 3 - LYMPHOVASCULAR INVASION: NOT IDENTIFIED - CANCER LENGTH: 11 MM - CALCIFICATIONS: NOT IDENTIFIED - DUCTAL CARCINOMA IN SITU: PRESENT, HIGH-GRADE - ER-, PR- HER2 - [IHC 1+] 2. Lymph node, needle/core biopsy, left axillary, hydromark - LYMPH NODE WITH REACTIVE CHANGES; NEGATIVE FOR  MALIGNANCY.  Menarche at age of 61 First live birth at age of 64 OCP use: no History of hysterectomy: no Menopausal status: premenopausal History of HRT use: no History of chest radiation: no Number of previous breast biopsies:  right breast biopsy 05/20/2019 negative for malignancy Strong family history of cancer mother breast cancer diagnosed in 3s, maternal aunts x 2 breast cancer, maternal cousins breast cancer x 2, maternal grandmother pancreatic cancer.    10/10/2022 Cancer Staging   Staging form: Breast, AJCC 8th Edition - Clinical stage from 10/10/2022: Stage IIIB (cT3, cN0, cM0, G3, ER-, PR-, HER2-) - Signed by [name], MD on 10/18/2022 Stage prefix: Initial diagnosis Nuclear grade: G3 Histologic grading system: 3 grade system   10/17/2022 Imaging   Bilateral MRI breasts w wo contrast  1. In total, there is approximately 6.7 cm of abnormal enhancement, predominantly in the lower-inner inferior left breast, with the ultrasound-guided biopsy marking clip centrally positioned within the enhancement. There are scattered small enhancing masses in the lower inner and lower outer anterior left (included in the measurement above).   2.  No evidence of right breast malignancy.   3. The axillary lymph nodes are not included within the field of view of this MRI for adequate assessment of lymphadenopathy.    10/17/2022 Procedure   S/p medi port placement.    10/25/2022 - 10/25/2022 Chemotherapy   Patient is on Treatment Plan : BREAST ADJUVANT DOSE DENSE AC q14d / PACLitaxel q7d     10/25/2022 -  Chemotherapy   Patient is on Treatment Plan : BREAST Pembrolizumab (200) D1 + Carboplatin (5) D1 + Paclitaxel (80) D1,8,15 q21d X 4 cycles / Pembrolizumab (200) D1 + AC D1 q21d x 4 cycles      Genetic Testing   Single pathogenic variant in BRCA1 called c.190T>G identified on the [facility] Multi-Cancer+RNA panel. The remainder of testing was normal. The report date is 11/11/2022.  The Multi-Cancer + RNA  Panel offered by [facility] includes sequencing and/or deletion/duplication analysis of the following 70 genes:  AIP*, ALK, APC*, ATM*, AXIN2*, BAP1*, BARD1*, BLM*, BMPR1A*, BRCA1*, BRCA2*, BRIP1*, CDC73*, CDH1*, CDK4, CDKN1B*, CDKN2A, CHEK2*, CTNNA1*, DICER1*, EPCAM, EGFR, FH*, FLCN*, GREM1, HOXB13, KIT, LZTR1, MAX*, MBD4, MEN1*, MET, MITF, MLH1*, MSH2*, MSH3*, MSH6*, MUTYH*, NF1*, NF2*, NTHL1*, PALB2*, PDGFRA, PMS2*, POLD1*, POLE*, POT1*, PRKAR1A*, PTCH1*, PTEN*, RAD51C*, RAD51D*, RB1*, RET, SDHA*, SDHAF2*, SDHB*, SDHC*, SDHD*, SMAD4*, SMARCA4*, SMARCB1*, SMARCE1*, STK11*, SUFU*, TMEM127*, TP53*, TSC1*, TSC2*, VHL*. RNA analysis is performed for * genes.   11/24/2022 - 11/25/2022 Hospital Admission   Admitted due to acute pulmonary embolism, lower extremity US negative for DVT.  Patietn was started on heparin gtt and transitioned to Eliquis at discharge.     Today she presents for evaluation prior to chemotherapy She tolerates chemotherapy.  On Eliquis 5mg  BID for PE. She tolerates well.  Today she reports headache since last night, "swimmy headed", nauseated.  She had fever of 101.2 in the clinic.  She has mild  runny nose, mild cough. No sore throat. She feels cold. She denies dysuria or urgency. No concerns of her medi port.     MEDICAL HISTORY:  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    SURGICAL HISTORY: Past Surgical History:  Procedure Laterality Date   BREAST BIOPSY Right 05/20/2019   Affirm Bx- X- clip, neg   BREAST BIOPSY Left 10/02/2022   [place] Bx, path pending   BREAST BIOPSY Left 10/02/2022   [place] Bx Node- path pending   BREAST BIOPSY Left 10/02/2022   [place] LT BREAST BX W LOC DEV 1ST LESION IMG BX SPEC US GUIDE 10/02/2022 [facility]   CESAREAN SECTION     LAPAROSCOPIC BILATERAL SALPINGECTOMY Bilateral 02/09/2022    Procedure: LAPAROSCOPIC BILATERAL SALPINGECTOMY;  Surgeon: Hildred Laser, MD;  Location: [facility];  Service: Gynecology;  Laterality: Bilateral;   PORTACATH PLACEMENT Right 10/17/2022   Procedure: INSERTION PORT-A-CATH;  Surgeon: Henrene Dodge, MD;  Location: [facility] ORS;  Service: General;  Laterality: Right;   TUBAL LIGATION      SOCIAL HISTORY: Social History   Socioeconomic History   Marital status: Single    Spouse name: Not on file   Number of children: 2   Years of education: Not on file   Highest education level: High school graduate  Occupational History   Not on file  Tobacco Use   Smoking status: Never    Passive exposure: Never   Smokeless tobacco: Never  Vaping Use   Vaping status: Never Used  Substance and Sexual Activity   Alcohol use: Yes    Comment: occ   Drug use: No   Sexual activity: Yes    Birth control/protection: Surgical  Other Topics Concern   Not on file  Social History Narrative   Not on file   Social Determinants of Health   Financial Resource Strain: Low Risk  (10/10/2022)   Overall Financial Resource Strain (CARDIA)    Difficulty of Paying Living Expenses: Not very hard  Food Insecurity: No Food Insecurity (11/25/2022)   Hunger Vital Sign    Worried About Running Out of Food in the Last Year: Never true    Ran Out of Food in the Last Year: Never true  Transportation Needs: No Transportation Needs (11/25/2022)   PRAPARE - Administrator, Civil Service (Medical): No    Lack of Transportation (Non-Medical): No  Physical Activity: Not on file  Stress: No Stress Concern Present (10/10/2022)   [employer] of Occupational Health - Occupational Stress Questionnaire    Feeling of Stress : Only a little  Social Connections: Not on file  Intimate Partner Violence: Not At Risk (11/25/2022)   Humiliation, Afraid, Rape, and Kick questionnaire    Fear of Current or Ex-Partner: No    Emotionally Abused: No    Physically Abused: No     Sexually Abused: No    FAMILY HISTORY: Family History  Problem Relation Age of Onset   Breast cancer Mother 14       no GT   Breast cancer Maternal Aunt 33   Breast cancer Maternal Aunt    Lung cancer Maternal Uncle    Lung cancer Maternal Uncle    Bone cancer Paternal Aunt    Pancreatic cancer Maternal Grandmother 60  Lung cancer Maternal Grandfather 40   Breast cancer Cousin 40   Breast cancer Other 33       gene pos?    ALLERGIES:  has No Known Allergies.  MEDICATIONS:  Current Outpatient Medications  Medication Sig Dispense Refill   acetaminophen (TYLENOL) 500 MG tablet Take 2 tablets (1,000 mg total) by mouth every 6 (six) hours as needed for mild pain.     apixaban (ELIQUIS) 5 MG TABS tablet Take 1 tablet (5 mg total) by mouth 2 (two) times daily. Start AFTER completing your starter pack 60 tablet 1   Calcium Carbonate-Vitamin D (CALCIUM 600 +D HIGH POTENCY) 600-10 MG-MCG TABS Take 1 tablet by mouth 2 (two) times daily. 60 tablet 3   lidocaine-prilocaine (EMLA) cream Apply 1 Application topically as needed. 30 g 11   LORazepam (ATIVAN) 0.5 MG tablet Take 1 tablet (0.5 mg total) by mouth every 8 (eight) hours as needed for anxiety (nausea or vomiting). 30 tablet 0   ondansetron (ZOFRAN) 8 MG tablet Take 1 tablet (8 mg total) by mouth See admin instructions. If needed, start on Day 3 of chemotherapy, take 1 tablet every 8 hours as needed for nausea or vomiting. 90 tablet 0   potassium chloride SA (KLOR-CON M) 20 MEQ tablet Take 1 tablet (20 mEq total) by mouth daily. 30 tablet 0   prochlorperazine (COMPAZINE) 10 MG tablet Take 1 tablet (10 mg total) by mouth every 6 (six) hours as needed for nausea or vomiting. 90 tablet 3   senna (SENOKOT) 8.6 MG TABS tablet Take 2 tablets (17.2 mg total) by mouth daily. 120 tablet 2   No current facility-administered medications for this visit.    Review of Systems  Constitutional:  Positive for fever. Negative for appetite change,  chills and fatigue.  HENT:   Negative for hearing loss and voice change.   Eyes:  Negative for eye problems.  Respiratory:  Negative for chest tightness and cough.   Cardiovascular:  Negative for chest pain.  Gastrointestinal:  Negative for abdominal distention, abdominal pain and blood in stool.  Endocrine: Negative for hot flashes.  Genitourinary:  Positive for menstrual problem. Negative for difficulty urinating and frequency.   Musculoskeletal:  Negative for arthralgias.  Skin:  Negative for itching and rash.  Neurological:  Positive for headaches. Negative for extremity weakness.  Hematological:  Negative for adenopathy.  Psychiatric/Behavioral:  Negative for confusion.      PHYSICAL EXAMINATION: ECOG PERFORMANCE STATUS: 0 - Asymptomatic  Vitals:   12/30/22 0859 12/30/22 0901  BP: 132/72 116/88  Pulse: (!) 148   Resp: 18   Temp: (!) 101.2 F (38.4 C)    Filed Weights   12/30/22 0859  Weight: 141 lb 11.2 oz (64.3 kg)    Physical Exam Constitutional:      General: She is not in acute distress.    Appearance: She is not diaphoretic.  HENT:     Head: Normocephalic and atraumatic.  Eyes:     General: No scleral icterus. Cardiovascular:     Rate and Rhythm: Normal rate and regular rhythm.  Pulmonary:     Effort: Pulmonary effort is normal. No respiratory distress.     Breath sounds: No wheezing.  Abdominal:     General: Bowel sounds are normal. There is no distension.     Palpations: Abdomen is soft.  Musculoskeletal:        General: Normal range of motion.     Cervical back: Normal range of motion and  neck supple.  Skin:    General: Skin is warm and dry.     Findings: No erythema.  Neurological:     Mental Status: She is alert and oriented to person, place, and time. Mental status is at baseline.     Motor: No abnormal muscle tone.  Psychiatric:        Mood and Affect: Mood and affect normal.     LABORATORY DATA:  I have reviewed the data as listed     Latest Ref Rng & Units 12/30/2022    8:28 AM 12/20/2022    8:59 AM 12/13/2022    1:00 PM  CBC  WBC 4.0 - 10.5 K/uL 10.4  4.2  3.2   Hemoglobin 12.0 - 15.0 g/dL 16.1  09.6  04.5   Hematocrit 36.0 - 46.0 % 36.1  32.7  32.3   Platelets 150 - 400 K/uL 184  329  193       Latest Ref Rng & Units 12/30/2022    8:28 AM 12/20/2022    8:59 AM 12/13/2022    1:00 PM  CMP  Glucose 70 - 99 mg/dL 409  94  811   BUN 6 - 20 mg/dL 9  17  14    Creatinine 0.44 - 1.00 mg/dL 9.14  7.82  9.56   Sodium 135 - 145 mmol/L 135  138  135   Potassium 3.5 - 5.1 mmol/L 3.6  4.2  3.8   Chloride 98 - 111 mmol/L 101  108  105   CO2 22 - 32 mmol/L 22  27  24    Calcium 8.9 - 10.3 mg/dL 8.8  9.0  9.0   Total Protein 6.5 - 8.1 g/dL 7.6  7.0  7.1   Total Bilirubin <1.2 mg/dL 0.3  0.2  0.3   Alkaline Phos 38 - 126 U/L 115  73  81   AST 15 - 41 U/L 32  17  18   ALT 0 - 44 U/L 33  19  19      RADIOGRAPHIC STUDIES: I have personally reviewed the radiological images as listed and agreed with the findings in the report. No results found.

## 2022-12-30 NOTE — Assessment & Plan Note (Addendum)
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  Images and pathology results were reviewed and discussed with patient.  MRI showed  6.7 cm of abnormal enhancement, predominantly in the lower-inner inferior left breast, with the ultrasound-guided biopsy marking clip centrally positioned within the enhancement. Suspicious for involvement by cancer, cT3 disease.  Patient desires mastectomy. Patient has no desire of preserving fertility.  Currently on neoadjuvant chemotherapy with Carboplatin, taxol Keytruda followed by [facility] Q3 weeks  Hold off chemotherapy due to acute febrile illness.

## 2022-12-30 NOTE — Addendum Note (Signed)
Addended by: [name] on: 12/30/2022 08:42 PM   Modules accepted: Orders

## 2022-12-30 NOTE — Progress Notes (Signed)
Pt here for follow up. Reports that she does not feel good today. She reports feeling "swimmy headed " and nauseated. She has a headache that started last night. Took Tylenol at 3am but its not helping.

## 2022-12-30 NOTE — Assessment & Plan Note (Addendum)
Check blood culture, CXR, UA urine culture, respiratory panel.   -Available results were reviewed.  Negative respiratory panel.  UA is positive for hemoglobin.  Bacteria.  Urine culture is pending. I will cover patient empirically with 5 days course of Augmentin.  I called patient and sent prescription.   12/31/2022  8:30 AM,  Prelim blood culture came back positive for Staph aureus. Patient was called and advised to go to ER for admission for IV antibiotics. ER Triage was called and updated.

## 2022-12-30 NOTE — Assessment & Plan Note (Signed)
Continue Eliquis 5mg BID 

## 2022-12-31 ENCOUNTER — Inpatient Hospital Stay: Payer: Medicaid Other

## 2022-12-31 ENCOUNTER — Other Ambulatory Visit: Payer: Self-pay

## 2022-12-31 ENCOUNTER — Inpatient Hospital Stay
Admission: EM | Admit: 2022-12-31 | Discharge: 2023-01-05 | DRG: 314 | Disposition: A | Discharge status: Home Health | Payer: Medicaid Other | Attending: Hospitalist | Admitting: Hospitalist

## 2022-12-31 ENCOUNTER — Telehealth: Payer: Self-pay

## 2022-12-31 ENCOUNTER — Other Ambulatory Visit: Payer: Self-pay | Admitting: Oncology

## 2022-12-31 ENCOUNTER — Encounter: Payer: Self-pay | Admitting: Emergency Medicine

## 2022-12-31 ENCOUNTER — Inpatient Hospital Stay (HOSPITAL_COMMUNITY)
Admit: 2022-12-31 | Discharge: 2022-12-31 | Disposition: A | Discharge status: Home / Self Care | Payer: Medicaid Other | Attending: Internal Medicine | Admitting: Internal Medicine

## 2022-12-31 DIAGNOSIS — Z801 Family history of malignant neoplasm of trachea, bronchus and lung: Secondary | ICD-10-CM | POA: Diagnosis not present

## 2022-12-31 DIAGNOSIS — Y848 Other medical procedures as the cause of abnormal reaction of the patient, or of later complication, without mention of misadventure at the time of the procedure: Secondary | ICD-10-CM | POA: Diagnosis present

## 2022-12-31 DIAGNOSIS — D84821 Immunodeficiency due to drugs: Secondary | ICD-10-CM | POA: Diagnosis present

## 2022-12-31 DIAGNOSIS — A4101 Sepsis due to Methicillin susceptible Staphylococcus aureus: Secondary | ICD-10-CM | POA: Diagnosis present

## 2022-12-31 DIAGNOSIS — R109 Unspecified abdominal pain: Secondary | ICD-10-CM | POA: Diagnosis not present

## 2022-12-31 DIAGNOSIS — D6481 Anemia due to antineoplastic chemotherapy: Secondary | ICD-10-CM | POA: Diagnosis present

## 2022-12-31 DIAGNOSIS — R197 Diarrhea, unspecified: Secondary | ICD-10-CM | POA: Diagnosis present

## 2022-12-31 DIAGNOSIS — I38 Endocarditis, valve unspecified: Secondary | ICD-10-CM

## 2022-12-31 DIAGNOSIS — Z800 Family history of malignant neoplasm of digestive organs: Secondary | ICD-10-CM | POA: Diagnosis not present

## 2022-12-31 DIAGNOSIS — Z79899 Other long term (current) drug therapy: Secondary | ICD-10-CM

## 2022-12-31 DIAGNOSIS — B9561 Methicillin susceptible Staphylococcus aureus infection as the cause of diseases classified elsewhere: Secondary | ICD-10-CM | POA: Diagnosis not present

## 2022-12-31 DIAGNOSIS — Z8701 Personal history of pneumonia (recurrent): Secondary | ICD-10-CM

## 2022-12-31 DIAGNOSIS — T451X5A Adverse effect of antineoplastic and immunosuppressive drugs, initial encounter: Secondary | ICD-10-CM | POA: Diagnosis present

## 2022-12-31 DIAGNOSIS — E871 Hypo-osmolality and hyponatremia: Secondary | ICD-10-CM | POA: Diagnosis present

## 2022-12-31 DIAGNOSIS — D649 Anemia, unspecified: Secondary | ICD-10-CM

## 2022-12-31 DIAGNOSIS — D696 Thrombocytopenia, unspecified: Secondary | ICD-10-CM

## 2022-12-31 DIAGNOSIS — C50912 Malignant neoplasm of unspecified site of left female breast: Secondary | ICD-10-CM | POA: Diagnosis present

## 2022-12-31 DIAGNOSIS — I2699 Other pulmonary embolism without acute cor pulmonale: Secondary | ICD-10-CM | POA: Diagnosis present

## 2022-12-31 DIAGNOSIS — C50919 Malignant neoplasm of unspecified site of unspecified female breast: Secondary | ICD-10-CM | POA: Diagnosis not present

## 2022-12-31 DIAGNOSIS — T80211A Bloodstream infection due to central venous catheter, initial encounter: Secondary | ICD-10-CM | POA: Diagnosis present

## 2022-12-31 DIAGNOSIS — A419 Sepsis, unspecified organism: Secondary | ICD-10-CM | POA: Insufficient documentation

## 2022-12-31 DIAGNOSIS — D6959 Other secondary thrombocytopenia: Secondary | ICD-10-CM | POA: Diagnosis present

## 2022-12-31 DIAGNOSIS — Z452 Encounter for adjustment and management of vascular access device: Secondary | ICD-10-CM | POA: Diagnosis not present

## 2022-12-31 DIAGNOSIS — R7881 Bacteremia: Secondary | ICD-10-CM | POA: Diagnosis present

## 2022-12-31 DIAGNOSIS — Z803 Family history of malignant neoplasm of breast: Secondary | ICD-10-CM | POA: Diagnosis not present

## 2022-12-31 DIAGNOSIS — E876 Hypokalemia: Secondary | ICD-10-CM | POA: Diagnosis present

## 2022-12-31 DIAGNOSIS — Z95828 Presence of other vascular implants and grafts: Secondary | ICD-10-CM

## 2022-12-31 DIAGNOSIS — Z7901 Long term (current) use of anticoagulants: Secondary | ICD-10-CM

## 2022-12-31 DIAGNOSIS — Z86711 Personal history of pulmonary embolism: Secondary | ICD-10-CM | POA: Diagnosis present

## 2022-12-31 LAB — BLOOD CULTURE ID PANEL (REFLEXED) - BCID2
Enterococcus Faecium: NOT DETECTED
Enterococcus faecalis: NOT DETECTED
Listeria monocytogenes: NOT DETECTED
Meth resistant mecA/C and MREJ: NOT DETECTED
Staphylococcus aureus (BCID): DETECTED — AB
Staphylococcus epidermidis: NOT DETECTED
Staphylococcus lugdunensis: NOT DETECTED
Staphylococcus species: DETECTED — AB
Streptococcus agalactiae: NOT DETECTED
Streptococcus pneumoniae: NOT DETECTED
Streptococcus pyogenes: NOT DETECTED
Streptococcus species: NOT DETECTED

## 2022-12-31 LAB — URINALYSIS, COMPLETE (UACMP) WITH MICROSCOPIC
Bilirubin Urine: NEGATIVE
Glucose, UA: NEGATIVE mg/dL
Ketones, ur: 20 mg/dL — AB
Leukocytes,Ua: NEGATIVE
Nitrite: NEGATIVE
Protein, ur: 30 mg/dL — AB
Specific Gravity, Urine: 1.019 (ref 1.005–1.030)
pH: 6 (ref 5.0–8.0)

## 2022-12-31 LAB — PROTIME-INR
INR: 1.3 — ABNORMAL HIGH (ref 0.8–1.2)
Prothrombin Time: 16 s — ABNORMAL HIGH (ref 11.4–15.2)

## 2022-12-31 LAB — CBC WITH DIFFERENTIAL/PLATELET
Abs Immature Granulocytes: 0.24 10*3/uL — ABNORMAL HIGH (ref 0.00–0.07)
Basophils Absolute: 0.1 10*3/uL (ref 0.0–0.1)
Basophils Relative: 0 %
Eosinophils Absolute: 0 10*3/uL (ref 0.0–0.5)
Eosinophils Relative: 0 %
HCT: 37.8 % (ref 36.0–46.0)
Hemoglobin: 12.4 g/dL (ref 12.0–15.0)
Immature Granulocytes: 1 %
Lymphocytes Relative: 4 %
Lymphs Abs: 0.9 10*3/uL (ref 0.7–4.0)
MCH: 29.2 pg (ref 26.0–34.0)
MCHC: 32.8 g/dL (ref 30.0–36.0)
MCV: 89.2 fL (ref 80.0–100.0)
Monocytes Absolute: 0.7 10*3/uL (ref 0.1–1.0)
Monocytes Relative: 3 %
Neutro Abs: 17.8 10*3/uL — ABNORMAL HIGH (ref 1.7–7.7)
Neutrophils Relative %: 92 %
Platelets: 136 10*3/uL — ABNORMAL LOW (ref 150–400)
RBC: 4.24 MIL/uL (ref 3.87–5.11)
RDW: 27.8 % — ABNORMAL HIGH (ref 11.5–15.5)
Smear Review: NORMAL
WBC: 19.6 10*3/uL — ABNORMAL HIGH (ref 4.0–10.5)
nRBC: 0.1 % (ref 0.0–0.2)

## 2022-12-31 LAB — COMPREHENSIVE METABOLIC PANEL
ALT: 68 U/L — ABNORMAL HIGH (ref 0–44)
AST: 60 U/L — ABNORMAL HIGH (ref 15–41)
Albumin: 4.3 g/dL (ref 3.5–5.0)
Alkaline Phosphatase: 106 U/L (ref 38–126)
Anion gap: 10 (ref 5–15)
BUN: 9 mg/dL (ref 6–20)
CO2: 24 mmol/L (ref 22–32)
Calcium: 9.2 mg/dL (ref 8.9–10.3)
Chloride: 97 mmol/L — ABNORMAL LOW (ref 98–111)
Creatinine, Ser: 0.77 mg/dL (ref 0.44–1.00)
GFR, Estimated: >60 mL/min (ref >60)
Glucose, Bld: 124 mg/dL — ABNORMAL HIGH (ref 70–99)
Potassium: 3.4 mmol/L — ABNORMAL LOW (ref 3.5–5.1)
Sodium: 131 mmol/L — ABNORMAL LOW (ref 135–145)
Total Bilirubin: 1.2 mg/dL — ABNORMAL HIGH (ref <1.2)
Total Protein: 8.7 g/dL — ABNORMAL HIGH (ref 6.5–8.1)

## 2022-12-31 LAB — GLUCOSE, CAPILLARY: Glucose-Capillary: 156 mg/dL — ABNORMAL HIGH (ref 70–99)

## 2022-12-31 LAB — LACTIC ACID, PLASMA
Lactic Acid, Venous: 1.3 mmol/L (ref 0.5–1.9)
Lactic Acid, Venous: 0.9 mmol/L (ref 0.5–1.9)

## 2022-12-31 LAB — URINE CULTURE: Culture: <10000 — AB

## 2022-12-31 LAB — APTT: aPTT: 34 s (ref 24–36)

## 2022-12-31 LAB — MRSA NEXT GEN BY PCR, NASAL: MRSA by PCR Next Gen: NOT DETECTED

## 2022-12-31 LAB — PROCALCITONIN: Procalcitonin: 2.92 ng/mL

## 2022-12-31 MED ORDER — PANTOPRAZOLE SODIUM 40 MG IV SOLR
40.0000 mg | Freq: Two times a day (BID) | INTRAVENOUS | Status: DC
  Administered 2022-12-31 – 2023-01-04 (×10): 40 mg via INTRAVENOUS
  Filled 2022-12-31 (×11): qty 10

## 2022-12-31 MED ORDER — ACETAMINOPHEN 325 MG PO TABS
650.0000 mg | ORAL_TABLET | Freq: Four times a day (QID) | ORAL | Status: DC | PRN
  Administered 2022-12-31 – 2023-01-02 (×5): 650 mg via ORAL
  Filled 2022-12-31 (×5): qty 2

## 2022-12-31 MED ORDER — SODIUM CHLORIDE 0.9 % IV SOLN
2.0000 g | Freq: Once | INTRAVENOUS | Status: DC
  Administered 2022-12-31: 2 g via INTRAVENOUS
  Filled 2022-12-31: qty 12.5

## 2022-12-31 MED ORDER — LACTATED RINGERS IV BOLUS
1000.0000 mL | Freq: Once | INTRAVENOUS | Status: AC
  Administered 2022-12-31: 1000 mL via INTRAVENOUS

## 2022-12-31 MED ORDER — ONDANSETRON HCL 4 MG PO TABS: 4.0000 mg | ORAL_TABLET | Freq: Four times a day (QID) | ORAL | Status: DC | PRN

## 2022-12-31 MED ORDER — VANCOMYCIN HCL 1500 MG/300ML IV SOLN
1500.0000 mg | Freq: Once | INTRAVENOUS | Status: DC
  Filled 2022-12-31: qty 300

## 2022-12-31 MED ORDER — LACTATED RINGERS IV SOLN: INTRAVENOUS | Status: AC

## 2022-12-31 MED ORDER — CEFAZOLIN SODIUM-DEXTROSE 2-4 GM/100ML-% IV SOLN
2.0000 g | Freq: Three times a day (TID) | INTRAVENOUS | Status: DC
  Administered 2022-12-31 – 2023-01-05 (×16): 2 g via INTRAVENOUS
  Filled 2022-12-31 (×18): qty 100

## 2022-12-31 MED ORDER — IOHEXOL 350 MG/ML SOLN
75.0000 mL | Freq: Once | INTRAVENOUS | Status: AC | PRN
  Administered 2022-12-31: 75 mL via INTRAVENOUS

## 2022-12-31 MED ORDER — LACTATED RINGERS IV BOLUS
1000.0000 mL | Freq: Once | INTRAVENOUS | Status: AC
Start: 2022-12-31 — End: 2022-12-31
  Administered 2022-12-31: 1000 mL via INTRAVENOUS

## 2022-12-31 MED ORDER — MORPHINE SULFATE (PF) 2 MG/ML IV SOLN
2.0000 mg | Freq: Once | INTRAVENOUS | Status: AC
  Administered 2022-12-31: 2 mg via INTRAVENOUS
  Filled 2022-12-31: qty 1

## 2022-12-31 MED ORDER — ONDANSETRON HCL 4 MG/2ML IJ SOLN
4.0000 mg | Freq: Four times a day (QID) | INTRAMUSCULAR | Status: DC | PRN
  Administered 2022-12-31: 4 mg via INTRAVENOUS
  Filled 2022-12-31: qty 2

## 2022-12-31 MED ORDER — KETOROLAC TROMETHAMINE 15 MG/ML IJ SOLN
15.0000 mg | Freq: Once | INTRAMUSCULAR | Status: AC
  Administered 2022-12-31: 15 mg via INTRAVENOUS
  Filled 2022-12-31: qty 1

## 2022-12-31 MED ORDER — ONDANSETRON HCL 4 MG/2ML IJ SOLN
4.0000 mg | Freq: Once | INTRAMUSCULAR | Status: AC
  Administered 2022-12-31: 4 mg via INTRAVENOUS
  Filled 2022-12-31: qty 2

## 2022-12-31 MED ORDER — APIXABAN 5 MG PO TABS
5.0000 mg | ORAL_TABLET | Freq: Two times a day (BID) | ORAL | Status: DC
  Administered 2022-12-31 – 2023-01-05 (×10): 5 mg via ORAL
  Filled 2022-12-31 (×10): qty 1

## 2022-12-31 MED ORDER — LIDOCAINE HCL 1 % IJ SOLN
10.0000 mL | Freq: Once | INTRAMUSCULAR | Status: AC
  Administered 2022-12-31: 10 mL via INTRADERMAL
  Filled 2022-12-31: qty 10

## 2022-12-31 NOTE — ED Notes (Signed)
Surgeon [place] at bedside to remove port. Made CN aware.

## 2022-12-31 NOTE — Progress Notes (Signed)
Pharmacy Antibiotic Note  Penny Gomez is a 37 y.o. female admitted on 12/31/2022 with MSSA bacteremia.  Pharmacy has been consulted for Cefazolin dosing.  Patient with breast cancer undergoing chemotherapy (pembrolizumab, carboplatin and paclitaxel) via port.  Presented to cancer center yesterday for chemotherapy but did not feel well.  Blood cultures were collected and came back with MSSA per BCID.  She was given prescription for amoxicillin/clavulanate but did not start taking it yet.  Her port per surgery notes dehisced with a 7mm opening and has been draining clear fluid x 1 month.  Cephalexin was prescribed 10/28 x 7 days. Patient notes fever and chills over last 24hr  Today, 12/31/2022 Day #1 cefazolin Renal: SCr WNL WBC increased 19.6 Afebrile 11/18 Bcx 4/4 GPC, BCID detects MSSA 11/19 Bcx:   Plan: Cefazolin 2gm IV q8h  Monitor blood culture results,  ECHO results, and renal function Anticipate OPAT ID following  Height: 5' (152.4 cm) Weight: 64.3 kg (141 lb 12.1 oz) IBW/kg (Calculated) : 45.5  Temp (24hrs), Avg:99.3 F (37.4 C), Min:99.3 F (37.4 C), Max:99.3 F (37.4 C)  Recent Labs  Lab 12/30/22 0828 12/31/22 1117  WBC 10.4 19.6*  CREATININE 0.85 0.77  LATICACIDVEN  --  1.3    Estimated Creatinine Clearance: 81.3 mL/min (by C-G formula based on SCr of 0.77 mg/dL).    No Known Allergies  Antimicrobials this admission: 11/19 cefazolin >>  Dose adjustments this admission:   Microbiology results: 11/18 BCx: MSSA  11/19 Bcx:  11/18 UCx: <10K    Thank you for allowing pharmacy to be a part of this patient's care.  Juliette Alcide, PharmD, BCPS, BCIDP Work Cell: [phone] 12/31/2022 1:19 PM

## 2022-12-31 NOTE — Sepsis Progress Note (Signed)
First documented time of blood cultures drawn is 1115

## 2022-12-31 NOTE — Consult Note (Addendum)
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 She has had a couple bouts of abx that could have suppressed overt sign of infection  Eitherway would tx as port infection/central line associated blood stream infection. I would get tte/tee and agree  She has no other metastatic focus of infection so far based on history; chest ct repeat in progress to look for any pulm nodular/cavitary changes which, if present, could be from central line emboli or right sided endocarditis  Plan: Start cefazolin F/u chest abd pelv cta  Surgery team is on board and evaluating for port removal Tte tee -- I have requested from [facility] Repeat bcx Discussed with primary team    ------------------------------------------------ Principal Problem:   Bacteremia Active Problems:   Invasive carcinoma of breast (HCC)   Port-A-Cath in place   Acute pulmonary embolism (HCC)   Sepsis (HCC)    HPI: Penny Gomez is a 37 y.o. 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 She came to oncology office the day of --> chemo skipped. She was rx'ed 5 days amox-clav which she hasn't taken. Micro w/u showed mssa in the blood and she was recalled   She denies any localizing symptoms including current headache, eyes pain, visual change, back, peripheral joint pain, rash, muscle pain  She reports the port site has been draining clear fluid since placement. She was given a brief course for cellulitis in the beginning for the drainage. Recently she has felt increased pressure in the port site.  Also she had admission 10/13-10/14/2024 with dry cough and sirs/sepsis. She had cta chest that showed PE in distal right main pulm artery extending into segmental branches, and also multifocal patchy ggo right middle lobe and right lower lobe as well as a small posterior right lower lobe peripheral opacity. She was given 2 days of iv ceftriaxone/azith, stopped early as presumed all related to PE  She resumed chemo and on the day prior to admission developed sx as mentioned. Her last chemo was on 12/20/22  Febrile 101 on 11/18 Wbc 20 on 11/19 admission date but afebrile (does have sinus tach) Stable clear discharge at the right chest port  Denies ulcer/wound left breast  Given a dose vanc/cefepime in ed; transitioned to cefazolin       Family  History  Problem Relation Age of Onset   Breast cancer Mother 104       no GT   Breast cancer Maternal Aunt 33   Breast cancer Maternal Aunt    Lung cancer Maternal Uncle    Lung cancer Maternal Uncle    Bone cancer Paternal Aunt    Pancreatic cancer Maternal Grandmother 25   Lung cancer Maternal Grandfather 24   Breast cancer Cousin 40   Breast cancer Other 33       gene pos?     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 rate (!) 23, height 5' (1.524 m), weight 64.3 kg, SpO2 100%.  Physical Exam  General/constitutional: no distress, pleasant HEENT: Normocephalic, PER, Conj Clear, EOMI, Oropharynx clear Neck supple CV: rrr no mrg Lungs: clear to auscultation, normal respiratory effort Abd: Soft, Nontender Ext: no edema Skin: No Rash Neuro: nonfocal MSK: no peripheral joint swelling/tenderness/warmth; back spines nontender   Central line presence: right chest port site no fluctuance or erythema or purulence; nonhealing wound present with serosanguinous oozing   Lab Results Lab Results  Component Value Date   WBC 19.6 (H) 12/31/2022   HGB 12.4 12/31/2022   HCT 37.8 12/31/2022   MCV 89.2 12/31/2022   PLT 136 (L) 12/31/2022    Lab Results  Component Value Date   CREATININE 0.77 12/31/2022   BUN 9 12/31/2022   NA 131 (L) 12/31/2022   K 3.4 (L) 12/31/2022   CL 97 (L) 12/31/2022   CO2 24 12/31/2022    Lab Results  Component Value Date   ALT 68 (H) 12/31/2022   AST 60 (H) 12/31/2022   ALKPHOS 106 12/31/2022   BILITOT 1.2 (H) 12/31/2022       Microbiology: Recent Results (from the past 240 hour(s))  Respiratory (~20 pathogens) panel by PCR     Status: None   Collection Time: 12/30/22  9:40 AM   Specimen: Nasopharyngeal Swab; Respiratory  Result Value Ref Range Status   Adenovirus NOT DETECTED NOT DETECTED Final   Coronavirus 229E NOT DETECTED NOT DETECTED Final    Comment: (NOTE) The Coronavirus on the Respiratory Panel, DOES NOT test for the novel  Coronavirus (2019 nCoV)    Coronavirus HKU1 NOT DETECTED NOT DETECTED Final   Coronavirus NL63 NOT DETECTED NOT DETECTED Final   Coronavirus OC43 NOT DETECTED NOT DETECTED Final   Metapneumovirus NOT DETECTED NOT DETECTED Final   Rhinovirus / Enterovirus NOT DETECTED NOT DETECTED Final   Influenza A NOT DETECTED NOT DETECTED Final   Influenza B  NOT DETECTED NOT DETECTED Final   Parainfluenza Virus 1 NOT DETECTED NOT DETECTED Final   Parainfluenza Virus 2 NOT DETECTED NOT DETECTED Final   Parainfluenza Virus 3 NOT DETECTED NOT DETECTED Final   Parainfluenza Virus 4 NOT DETECTED NOT DETECTED Final   Respiratory Syncytial Virus NOT DETECTED NOT DETECTED Final   Bordetella pertussis NOT DETECTED NOT DETECTED Final   Bordetella Parapertussis NOT DETECTED NOT DETECTED Final   Chlamydophila pneumoniae NOT DETECTED NOT DETECTED Final   Mycoplasma pneumoniae NOT DETECTED NOT DETECTED Final    Comment: Performed at [facility], [address] [place], [place] [address]  Urine Culture     Status: Abnormal   Collection Time: 12/30/22  9:42 AM   Specimen: Urine, Clean Catch  Result Value Ref Range Status   Specimen Description   Final    URINE, CLEAN CATCH Performed at [facility], [address], [place], [place] [address]    Special Requests   Final    NONE Performed at [facility], [address] [place], [place] [address]    Culture (A)  Final    <10,000 COLONIES/mL INSIGNIFICANT GROWTH Performed at [facility], [address],  [place], [place] [address]    Report Status 12/31/2022 FINAL  Final  Culture, blood (routine x 2)     Status: None (Preliminary result)   Collection Time: 12/30/22  9:56 AM   Specimen: BLOOD LEFT ARM  Result Value Ref Range Status   Specimen Description   Final    BLOOD LEFT ARM Performed at [facility], [address], [place], [place] [address]    Special Requests   Final    BOTTLES DRAWN AEROBIC AND ANAEROBIC Blood Culture adequate volume Performed at [facility], [address] [place], [place] [address]    Culture  Setup Time   Final    GRAM POSITIVE COCCI IN BOTH AEROBIC AND ANAEROBIC BOTTLES CRITICAL VALUE NOTED.  VALUE IS CONSISTENT WITH PREVIOUSLY REPORTED AND CALLED VALUE. Performed at [facility], [address] [place], [place] [address]    Culture [facility] POSITIVE COCCI  Final   Report Status PENDING  Incomplete  Culture, blood (routine x 2)     Status: None (Preliminary result)   Collection Time: 12/30/22 10:04 AM   Specimen: BLOOD RIGHT ARM  Result Value Ref Range Status   Specimen Description   Final    BLOOD RIGHT ARM Performed at [facility], [address] [place], [place] [address]    Special Requests   Final    BOTTLES DRAWN AEROBIC AND ANAEROBIC Blood Culture adequate volume Performed at [facility], [address] [place], [place] [address]    Culture  Setup Time   Final    GRAM POSITIVE COCCI IN BOTH AEROBIC AND ANAEROBIC BOTTLES CRITICAL RESULT CALLED TO, READ BACK BY AND VERIFIED WITH: JASON ROBBINS@[email or web address]  12/31/22 RH Performed at [facility], [address] [place], [place] [address]    Culture GRAM POSITIVE COCCI  Final   Report Status PENDING  Incomplete  Blood Culture ID Panel (Reflexed)     Status: Abnormal (Preliminary result)   Collection Time: 12/30/22 10:04 AM  Result Value Ref Range Status   Enterococcus faecalis NOT DETECTED NOT DETECTED Final   Enterococcus Faecium NOT DETECTED NOT  DETECTED Final   Listeria monocytogenes NOT DETECTED NOT DETECTED Final   Staphylococcus species DETECTED (A) NOT DETECTED Final  Comment: CRITICAL RESULT CALLED TO, READ BACK BY AND VERIFIED WITH: JASON ROBBINS@0555  12/31/22 RH    Staphylococcus aureus (BCID) DETECTED (A) NOT DETECTED Final    Comment: CRITICAL RESULT CALLED TO, READ BACK BY AND VERIFIED WITH: JASON ROBBINS@0555  12/31/22 RH    Staphylococcus epidermidis NOT DETECTED NOT DETECTED Final   Staphylococcus lugdunensis NOT DETECTED NOT DETECTED Final   Streptococcus species NOT DETECTED NOT DETECTED Final   Streptococcus agalactiae NOT DETECTED NOT DETECTED Final   Streptococcus pneumoniae NOT DETECTED NOT DETECTED Final   Streptococcus pyogenes NOT DETECTED NOT DETECTED Final   A.calcoaceticus-baumannii PENDING NOT DETECTED Incomplete   Bacteroides fragilis PENDING NOT DETECTED Incomplete   Enterobacterales PENDING NOT DETECTED Incomplete   Enterobacter cloacae complex PENDING NOT DETECTED Incomplete   Escherichia coli PENDING NOT DETECTED Incomplete   Klebsiella aerogenes PENDING NOT DETECTED Incomplete   Klebsiella oxytoca PENDING NOT DETECTED Incomplete   Klebsiella pneumoniae PENDING NOT DETECTED Incomplete   Proteus species PENDING NOT DETECTED Incomplete   Salmonella species PENDING NOT DETECTED Incomplete   Serratia marcescens PENDING NOT DETECTED Incomplete   Haemophilus influenzae PENDING NOT DETECTED Incomplete   Neisseria meningitidis PENDING NOT DETECTED Incomplete   Pseudomonas aeruginosa PENDING NOT DETECTED Incomplete   Stenotrophomonas maltophilia PENDING NOT DETECTED Incomplete   Candida albicans PENDING NOT DETECTED Incomplete   Candida auris PENDING NOT DETECTED Incomplete   Candida glabrata PENDING NOT DETECTED Incomplete   Candida krusei PENDING NOT DETECTED Incomplete   Candida parapsilosis PENDING NOT DETECTED Incomplete   Candida tropicalis PENDING NOT DETECTED Incomplete   Cryptococcus  neoformans/gattii PENDING NOT DETECTED Incomplete   Meth resistant mecA/C and MREJ NOT DETECTED NOT DETECTED Final    Comment: Performed at [facility], [address], [place], [place] [address]     Serology:    Imaging: If present, new imagings (plain films, ct scans, and mri) have been personally visualized and interpreted; radiology reports have been reviewed. Decision making incorporated into the Impression / Recommendations.  11/18 cxr at onc clinic No report yet but my review --> clear bilateral chest; normal cardiac silhouhete; present of right internal jugular port  11/19 cta chest/abd/pelv pending    Raymondo Band, MD [facility] [phone] pager    12/31/2022, 12:48 PM

## 2022-12-31 NOTE — Telephone Encounter (Signed)
Per MD "blood culture grew Staph, please ask patient to go to ER to be admitted for IV antibiotics."  Called and informed pt of MD recommendation. Pt verbalized understanding.

## 2022-12-31 NOTE — Progress Notes (Signed)
PHARMACY - PHYSICIAN COMMUNICATION CRITICAL VALUE ALERT - BLOOD CULTURE IDENTIFICATION (BCID)  Penny Gomez is an 37 y.o. female who presented to [facility] on 12/30/2022 with a chief complaint of fever (oncologist saw patient 11/18 at [facility] cancer center)   Assessment:  Blood cultures were collected at office visit 11/18 and have GPC in both sets and BCID detects methicillin-susceptible S. Aureus.  Source to evaluate is wound at [facility] port-a-cath site with evidence of dehiscence per general surgery office notes.    Name of physician (or Provider) Contacted: Dr Cathie Hoops (Oncology)  Current antibiotics: prescribed amoxicillin/clavulanate at office visit  Changes to prescribed antibiotics recommended:  - Dr Cathie Hoops' s office will call patient to come back to ED for admission and IV antibiotics.  I have notified ED staff that patient has been asked to report to ED for admission.  I suggest cefazolin 2gm IV q8h to treat MSSA (ID will also be auto consulted once presents and is admitted).    Results for orders placed or performed in visit on 12/30/22  Blood Culture ID Panel (Reflexed) (Collected: 12/30/2022 10:04 AM)  Result Value Ref Range   Enterococcus faecalis NOT DETECTED NOT DETECTED   Enterococcus Faecium NOT DETECTED NOT DETECTED   Listeria monocytogenes NOT DETECTED NOT DETECTED   Staphylococcus species DETECTED (A) NOT DETECTED   Staphylococcus aureus (BCID) DETECTED (A) NOT DETECTED   Staphylococcus epidermidis NOT DETECTED NOT DETECTED   Staphylococcus lugdunensis NOT DETECTED NOT DETECTED   Streptococcus species NOT DETECTED NOT DETECTED   Streptococcus agalactiae NOT DETECTED NOT DETECTED   Streptococcus pneumoniae NOT DETECTED NOT DETECTED   Streptococcus pyogenes NOT DETECTED NOT DETECTED   A.calcoaceticus-baumannii PENDING NOT DETECTED   Bacteroides fragilis PENDING NOT DETECTED   Enterobacterales PENDING NOT DETECTED   Enterobacter cloacae complex PENDING NOT DETECTED   Escherichia  coli PENDING NOT DETECTED   Klebsiella aerogenes PENDING NOT DETECTED   Klebsiella oxytoca PENDING NOT DETECTED   Klebsiella pneumoniae PENDING NOT DETECTED   Proteus species PENDING NOT DETECTED   Salmonella species PENDING NOT DETECTED   Serratia marcescens PENDING NOT DETECTED   Haemophilus influenzae PENDING NOT DETECTED   Neisseria meningitidis PENDING NOT DETECTED   Pseudomonas aeruginosa PENDING NOT DETECTED   Stenotrophomonas maltophilia PENDING NOT DETECTED   Candida albicans PENDING NOT DETECTED   Candida auris PENDING NOT DETECTED   Candida glabrata PENDING NOT DETECTED   Candida krusei PENDING NOT DETECTED   Candida parapsilosis PENDING NOT DETECTED   Candida tropicalis PENDING NOT DETECTED   Cryptococcus neoformans/gattii PENDING NOT DETECTED   Meth resistant mecA/C and MREJ NOT DETECTED NOT DETECTED    Juliette Alcide, PharmD, BCPS, BCIDP Work Cell: [phone] 12/31/2022 8:49 AM

## 2022-12-31 NOTE — ED Notes (Signed)
Pt to CT for CTA 

## 2022-12-31 NOTE — Consult Note (Signed)
CODE SEPSIS - PHARMACY COMMUNICATION  **Broad Spectrum Antibiotics should be administered within 1 hour of Sepsis diagnosis**  Time Code Sepsis Called/Page Received: 1106  Antibiotics Ordered: Vancomycin & Cefepime   Time of 1st antibiotic administration: 1137  Additional action taken by pharmacy: N/A  If necessary, Name of Provider/Nurse Contacted: N/A  Littie Deeds, PharmD Pharmacy Resident  12/31/2022 11:07 AM

## 2022-12-31 NOTE — Assessment & Plan Note (Signed)
Noted evaluation 11/18 w/ oncology for febrile illness  Blood cultures growing Staph aureus  Case discussed w/ Dr. Renold Don  Recommendation for IV cefazolin pending blood cultures Noted R upper chest port w/ recurrent serous drainage w/ concern for secondary infection- suspected source Pending general surgery eval for likely port removal

## 2022-12-31 NOTE — ED Provider Notes (Addendum)
[facility] Provider Note    Event Date/Time   First MD Initiated Contact with Patient 12/31/22 1052     (approximate)   History   abnormal labs   HPI  Penny Gomez is a 37 y.o. female with breast cancer, currently undergoing chemotherapy.  Patient reports last chemo about 2 weeks ago  Patient reports for a couple days now she has had fevers and chills.  She went to the cancer center, yesterday noted she had a high fever.  She had lots of blood work urine and a chest x-ray done.  She received a call back today that she had bacteria in her blood and needed to come to the hospital to be admitted  She does report last night she also had some loose watery stools.  She is not in any pain or discomfort.  No cough.  She does report fever to 102.  She took Tylenol this morning  She has had a very slow to close incision from where her port was placed in her right chest, she has been following with surgery for it.  Continues to have some drainage     Physical Exam   Triage Vital Signs: ED Triage Vitals  Encounter Vitals Group     BP 12/31/22 1048 122/63     Systolic BP Percentile --      Diastolic BP Percentile --      Pulse Rate 12/31/22 1046 (!) 135     Resp 12/31/22 1046 20     Temp 12/31/22 1046 99.3 F (37.4 C)     Temp Source 12/31/22 1046 Oral     SpO2 12/31/22 1046 98 %     Weight 12/31/22 1048 141 lb 12.1 oz (64.3 kg)     Height 12/31/22 1048 5' (1.524 m)     Head Circumference --      Peak Flow --      Pain Score 12/31/22 1047 8     Pain Loc --      Pain Education --      Exclude from Growth Chart --     Most recent vital signs: Vitals:   01/02/23 0555 01/02/23 0711  BP: 108/80 116/75  Pulse: (!) 107 (!) 106  Resp: 18 (!) 26  Temp: 98.8 F (37.1 C) 98.6 F (37 C)  SpO2: 98% 99%     General: Awake, no distress.  She is resting comfortably without distress.  She is notable T[place]ade tachycardic.  Capillary refills normal CV:  Good  peripheral perfusion.  Tachycardia normal tones Resp:  Normal effort.  Clear bilateral. Skin exam no rashes or lesions noted except the right upper chest her Port-A-Cath site the wound is slightly dehisced and, and there is slight purulent drainage from the medial wound margin/zone of slow healing.  There is no obvious surrounding cellulitic change though, but I am concerned that there is some drainage from the site that appears slightly purulent Abd:  No distention.  Soft nontender nondistended Other:  No lower extremity lesions       ED Results / Procedures / Treatments   Labs (all labs ordered are listed, but only abnormal results are displayed) Labs Reviewed  BLOOD CULTURE ID PANEL (REFLEXED) - BCID2 - Abnormal; Notable for the following components:      Result Value   Staphylococcus species DETECTED (*)    Staphylococcus aureus (BCID) DETECTED (*)    All other components within normal limits  COMPREHENSIVE METABOLIC PANEL -  Abnormal; Notable for the following components:   Sodium 131 (*)    Potassium 3.4 (*)    Chloride 97 (*)    Glucose, Bld 124 (*)    Total Protein 8.7 (*)    AST 60 (*)    ALT 68 (*)    Total Bilirubin 1.2 (*)    All other components within normal limits  CBC WITH DIFFERENTIAL/PLATELET - Abnormal; Notable for the following components:   WBC 19.6 (*)    RDW 27.8 (*)    Platelets 136 (*)    Neutro Abs 17.8 (*)    Abs Immature Granulocytes 0.24 (*)    All other components within normal limits  PROTIME-INR - Abnormal; Notable for the following components:   Prothrombin Time 16.0 (*)    INR 1.3 (*)    All other components within normal limits  URINALYSIS, COMPLETE (UACMP) WITH MICROSCOPIC - Abnormal; Notable for the following components:   Color, Urine YELLOW (*)    APPearance CLEAR (*)    Hgb urine dipstick MODERATE (*)    Ketones, ur 20 (*)    Protein, ur 30 (*)    Bacteria, UA RARE (*)    All other components within normal limits  CBC -  Abnormal; Notable for the following components:   RBC 3.44 (*)    Hemoglobin 10.0 (*)    HCT 29.7 (*)    Platelets 71 (*)    All other components within normal limits  COMPREHENSIVE METABOLIC PANEL - Abnormal; Notable for the following components:   Sodium 129 (*)    Glucose, Bld 174 (*)    Calcium 8.0 (*)    Albumin 3.2 (*)    AST 53 (*)    ALT 57 (*)    Anion gap 4 (*)    All other components within normal limits  GLUCOSE, CAPILLARY - Abnormal; Notable for the following components:   Glucose-Capillary 156 (*)    All other components within normal limits  CBC WITH DIFFERENTIAL/PLATELET - Abnormal; Notable for the following components:   RBC 3.47 (*)    Hemoglobin 10.0 (*)    HCT 30.4 (*)    RDW 27.8 (*)    Platelets 72 (*)    Neutro Abs 7.8 (*)    All other components within normal limits  CULTURE, BLOOD (ROUTINE X 2)  CULTURE, BLOOD (ROUTINE X 2)  MRSA NEXT GEN BY PCR, NASAL  CULTURE, BLOOD (ROUTINE X 2)  CULTURE, BLOOD (ROUTINE X 2)  LACTIC ACID, PLASMA  APTT  PROCALCITONIN  LACTIC ACID, PLASMA  IMMATURE PLATELET FRACTION  TECHNOLOGIST SMEAR REVIEW  CBC WITH DIFFERENTIAL/PLATELET    RADIOLOGY   CT Angio Chest Pulmonary Embolism (PE) W or WO Contrast  Result Date: 12/31/2022 CLINICAL DATA:  Elevated liver function tests. Fever. Abdominal pain. Positive blood culture. * Tracking Code: BO * breast cancer. EXAM: CT ANGIOGRAPHY CHEST CT ABDOMEN AND PELVIS WITH CONTRAST TECHNIQUE: Multidetector CT imaging of the chest was performed using the standard protocol during bolus administration of intravenous contrast. Multiplanar CT image reconstructions and MIPs were obtained to evaluate the vascular anatomy. Multidetector CT imaging of the abdomen and pelvis was performed using the standard protocol during bolus administration of intravenous contrast. RADIATION DOSE REDUCTION: This exam was performed according to the departmental dose-optimization program which includes  automated exposure control, adjustment of the mA and/or kV according to patient size and/or use of iterative reconstruction technique. CONTRAST:  75mL OMNIPAQUE IOHEXOL 350 MG/ML SOLN COMPARISON:  03/20/2020 abdominopelvic CT.  FINDINGS: CTA CHEST FINDINGS Cardiovascular: The quality of this exam for evaluation of pulmonary embolism is poor to moderate. The bolus is suboptimally timed, centered in the SVC. In addition, there is minimal motion. No central or large lobar pulmonary embolism. Possible small volume pulmonary emboli to the right lower lobe, including within segmental branches on 140/5. Right Port-A-Cath tip high right atrium. subtle soft tissue fullness at the Port-A-Cath insertion site in the neck including on 08/04 is likely related to recent port placement. Normal aortic caliber. Mild cardiomegaly, without pericardial effusion. Mediastinum/Nodes: No axillary or subpectoral adenopathy. No mediastinal or hilar adenopathy. Distal esophageal wall thickening is mild-to-moderate on 81/4. No internal mammary adenopathy. Lungs/Pleura: No pleural fluid. Minimal motion degradation in the lower chest. Dependent right lower lobe airspace and ground-glass opacity. 2 mm subpleural right upper lobe pulmonary nodule on 34/6, nonspecific. Right middle lobe pulmonary nodules x2 including up to maximally 3 mm on 71/6 were present on 03/20/2020 and considered benign. Musculoskeletal: No acute osseous abnormality. Review of the MIP images confirms the above findings. CT ABDOMEN and PELVIS FINDINGS Hepatobiliary: Mild limitations secondary to patient arm position, EKG wires and lead artifacts. Hepatomegaly at 21.4 cm. 6 mm subcapsular right hepatic lobe lesion is not readily apparent on the prior. A central left hepatic lobe 4 mm hypoattenuating lesion is present on the prior and likely a cyst. Normal gallbladder, without biliary ductal dilatation. Pancreas: Normal, without mass or ductal dilatation. Spleen: Normal in size,  without focal abnormality. Adrenals/Urinary Tract: Normal adrenal glands. Minimal bilateral caliectasis without significant hydroureter. The bladder is mildly distended, likely causative. Appearance is relatively similar to 03/20/2020. Stomach/Bowel: Normal stomach, without wall thickening. Normal large and small bowel loops. Vascular/Lymphatic: Normal caliber of the aorta and branch vessels. No abdominopelvic adenopathy. Reproductive: Fibroid uterus again identified. Uterine masses up to 3.4 cm. Tubular right adnexal cystic structure is again favored to represent hydrosalpinx. Relatively similar at up to 2.5 cm on 79/11. Other: Trace free pelvic fluid is likely physiologic. No abdominal ascites or free intraperitoneal air. Musculoskeletal: No acute osseous abnormality. Review of the MIP images confirms the above findings. IMPRESSION: CT CHEST IMPRESSION 1. Poor to moderate quality evaluation for pulmonary embolism. Possible small right lower lobe segmental to subsegmental pulmonary emboli. Of questionable clinical significance, given volume. 2. Right lower lobe airspace and ground-glass opacity, favoring pneumonia. If the patient does not have infectious symptoms, infarct is a secondary consideration. 3. No evidence of metastatic disease in the chest 4. Distal esophageal wall thickening, suspicious for esophagitis. CT ABDOMEN AND PELVIS IMPRESSION 1. Multifactorial degraded evaluation of the abdomen and pelvis. 2. 6 mm right hepatic lobe lesion is too small to characterize, felt to be new since the prior exam. Favored to represent a cyst but technically indeterminate. Potential clinical strategies include follow-up on routine screening CTs versus further evaluation with nonemergent outpatient pre and post-contrast MRI. 3. Otherwise, no evidence of metastatic disease in the abdomen or pelvis. 4. Minimal bilateral caliectasis, similar and likely related to bladder distention. 5. Fibroid uterus 6. Right-sided  hydrosalpinx, similar. Electronically Signed   By: Jeronimo Greaves M.D.   On: 12/31/2022 15:07   CT ABDOMEN PELVIS W CONTRAST  Result Date: 12/31/2022 CLINICAL DATA:  Elevated liver function tests. Fever. Abdominal pain. Positive blood culture. * Tracking Code: BO * breast cancer. EXAM: CT ANGIOGRAPHY CHEST CT ABDOMEN AND PELVIS WITH CONTRAST TECHNIQUE: Multidetector CT imaging of the chest was performed using the standard protocol during bolus administration of intravenous contrast. Multiplanar  CT image reconstructions and MIPs were obtained to evaluate the vascular anatomy. Multidetector CT imaging of the abdomen and pelvis was performed using the standard protocol during bolus administration of intravenous contrast. RADIATION DOSE REDUCTION: This exam was performed according to the departmental dose-optimization program which includes automated exposure control, adjustment of the mA and/or kV according to patient size and/or use of iterative reconstruction technique. CONTRAST:  75mL OMNIPAQUE IOHEXOL 350 MG/ML SOLN COMPARISON:  03/20/2020 abdominopelvic CT. FINDINGS: CTA CHEST FINDINGS Cardiovascular: The quality of this exam for evaluation of pulmonary embolism is poor to moderate. The bolus is suboptimally timed, centered in the SVC. In addition, there is minimal motion. No central or large lobar pulmonary embolism. Possible small volume pulmonary emboli to the right lower lobe, including within segmental branches on 140/5. Right Port-A-Cath tip high right atrium. subtle soft tissue fullness at the Port-A-Cath insertion site in the neck including on 08/04 is likely related to recent port placement. Normal aortic caliber. Mild cardiomegaly, without pericardial effusion. Mediastinum/Nodes: No axillary or subpectoral adenopathy. No mediastinal or hilar adenopathy. Distal esophageal wall thickening is mild-to-moderate on 81/4. No internal mammary adenopathy. Lungs/Pleura: No pleural fluid. Minimal motion  degradation in the lower chest. Dependent right lower lobe airspace and ground-glass opacity. 2 mm subpleural right upper lobe pulmonary nodule on 34/6, nonspecific. Right middle lobe pulmonary nodules x2 including up to maximally 3 mm on 71/6 were present on 03/20/2020 and considered benign. Musculoskeletal: No acute osseous abnormality. Review of the MIP images confirms the above findings. CT ABDOMEN and PELVIS FINDINGS Hepatobiliary: Mild limitations secondary to patient arm position, EKG wires and lead artifacts. Hepatomegaly at 21.4 cm. 6 mm subcapsular right hepatic lobe lesion is not readily apparent on the prior. A central left hepatic lobe 4 mm hypoattenuating lesion is present on the prior and likely a cyst. Normal gallbladder, without biliary ductal dilatation. Pancreas: Normal, without mass or ductal dilatation. Spleen: Normal in size, without focal abnormality. Adrenals/Urinary Tract: Normal adrenal glands. Minimal bilateral caliectasis without significant hydroureter. The bladder is mildly distended, likely causative. Appearance is relatively similar to 03/20/2020. Stomach/Bowel: Normal stomach, without wall thickening. Normal large and small bowel loops. Vascular/Lymphatic: Normal caliber of the aorta and branch vessels. No abdominopelvic adenopathy. Reproductive: Fibroid uterus again identified. Uterine masses up to 3.4 cm. Tubular right adnexal cystic structure is again favored to represent hydrosalpinx. Relatively similar at up to 2.5 cm on 79/11. Other: Trace free pelvic fluid is likely physiologic. No abdominal ascites or free intraperitoneal air. Musculoskeletal: No acute osseous abnormality. Review of the MIP images confirms the above findings. IMPRESSION: CT CHEST IMPRESSION 1. Poor to moderate quality evaluation for pulmonary embolism. Possible small right lower lobe segmental to subsegmental pulmonary emboli. Of questionable clinical significance, given volume. 2. Right lower lobe airspace  and ground-glass opacity, favoring pneumonia. If the patient does not have infectious symptoms, infarct is a secondary consideration. 3. No evidence of metastatic disease in the chest 4. Distal esophageal wall thickening, suspicious for esophagitis. CT ABDOMEN AND PELVIS IMPRESSION 1. Multifactorial degraded evaluation of the abdomen and pelvis. 2. 6 mm right hepatic lobe lesion is too small to characterize, felt to be new since the prior exam. Favored to represent a cyst but technically indeterminate. Potential clinical strategies include follow-up on routine screening CTs versus further evaluation with nonemergent outpatient pre and post-contrast MRI. 3. Otherwise, no evidence of metastatic disease in the abdomen or pelvis. 4. Minimal bilateral caliectasis, similar and likely related to bladder distention. 5. Fibroid uterus  6. Right-sided hydrosalpinx, similar. Electronically Signed   By: Jeronimo Greaves M.D.   On: 12/31/2022 15:07       Noted very mild transaminitis which could be related to her bacteremia or sepsis, but given her chemo as well as fever and the loose stools I have added CT abdomen pelvis as well  PROCEDURES:  Critical Care performed: Yes, see critical care procedure note(s)  Procedures  CRITICAL CARE Performed by: Sharyn Creamer   Total critical care time: 30 minutes  Critical care time was exclusive of separately billable procedures and treating other patients.  Critical care was necessary to treat or prevent imminent or life-threatening deterioration.  Critical care was time spent personally by me on the following activities: development of treatment plan with patient and/or surrogate as well as nursing, discussions with consultants, evaluation of patient's response to treatment, examination of patient, obtaining history from patient or surrogate, ordering and performing treatments and interventions, ordering and review of laboratory studies, ordering and review of radiographic  studies, pulse oximetry and re-evaluation of patient's condition.     IMPRESSION / MDM / ASSESSMENT AND PLAN / ED COURSE  I reviewed the triage vital signs and the nursing notes.                              Differential diagnosis includes, but is not limited to, bacteremia, sepsis, colitis, port infection, etc.  Based on my clinical assessment I am very concerned of the patient's right chest port is infected.  She has also bacteremia positive staph.  Full isolate is not yet available.  Discussed with our pharmacist, medicine, and I will start the patient on vancomycin and cefepime.  She did advise cefazolin may be an option based on ID pharmacist recommendation prior, however I do have elevated concern for possible MRSA or other Co. contaminants as well and will cover broadly at this time  Patient understanding agreeable plan.  She does relate a mild headache she does report she had some loose stool yesterday but her symptoms have been preceding this.  Ice is most suspect this is a port infection, there is no meningismus, there is no associated chest pain no cough no abdominal pain.  Some loose stool, will send for culture.  Further workup anticipated under hospitalist service, consulted with Dr. Alvester Morin.  Patient does not have hypotension but has severe tachycardia.  She reports a fever of 102 yesterday, and with this meets sepsis criteria.  Code sepsis initiated she does not have hypotension, lactic acid is pending.  If lactate is less than 4 then there would be no evidence of clear shock at this time  Patient's presentation is most consistent with acute presentation with potential threat to life or bodily function.   The patient is on the cardiac monitor to evaluate for evidence of arrhythmia and/or significant heart rate changes.   Labs from yesterday reviewed by me.  Metabolic panel reassuring, CBC also reviewed.  Negative pregnancy test urinalysis without clear evidence of infection.   Will reevaluate CBC and metabolic panel from today   The patient and her mother both understanding of plan for admission   Significant leukocytosis labs   sepsis reassessment demonstrates improving lactic acid, patient alert, awawiting admittion  FINAL CLINICAL IMPRESSION(S) / ED DIAGNOSES   Final diagnoses:  Bloodstream infection due to Port-A-Cath, initial encounter  Sepsis, due to unspecified organism, unspecified whether acute organ dysfunction present (HCC)  Rx / DC Orders   ED Discharge Orders     None        Note:  This document was prepared using Dragon voice recognition software and may include unintentional dictation errors.   Sharyn Creamer, MD 01/02/23 [id number]    Sharyn Creamer, MD 01/15/23 [id number]

## 2022-12-31 NOTE — Assessment & Plan Note (Signed)
Some concern for R upper chest port infection in setting of sepsis and bacteremia  Pending general surgery evaluation  Continue sepsis and bacteremia treatment protocol  Follow up general surgery recommendations

## 2022-12-31 NOTE — ED Notes (Signed)
Spoke with Dr Alvester Morin, tylenol given for fever 103.1. Also per Dr Everlene Farrier pt can be restarted on Eliquis. Pt did not take dose today but did take yesterday.

## 2022-12-31 NOTE — Assessment & Plan Note (Signed)
Followed by Dr. Cathie Hoops outpt  Currently on carbo/taxol/Keytruda  Follow up oncology recommendations

## 2022-12-31 NOTE — Sepsis Progress Note (Signed)
Elink monitoring for the code sepsis protocol.  

## 2022-12-31 NOTE — Telephone Encounter (Signed)
Pt is currenlty in hospital and Dr. Cathie Hoops will put chemo plan on hold as she is admitted due to bacteremia.   appts have been cancelled and she is currently TBD. Will reschedule once MD is ok with restarting tx.

## 2022-12-31 NOTE — Progress Notes (Signed)
   [facility] has been requested to perform a transesophageal echocardiogram on Penny Gomez for bacteremia.  After careful review of history and examination, the risks and benefits of transesophageal echocardiogram have been explained including risks of esophageal damage, perforation (1:10,000 risk), bleeding, pharyngeal hematoma as well as other potential complications associated with conscious sedation including aspiration, arrhythmia, respiratory failure and death. Alternatives to treatment were discussed, questions were answered. Patient is willing to proceed.  The procedure is scheduled for 11/21 @ 7:30a w/ Dr. Mariah Milling.  Nicolasa Ducking, NP  12/31/2022 5:07 PM

## 2022-12-31 NOTE — Sepsis Progress Note (Signed)
Notified bedside nurse of need to draw lactic acid and blood cultures, then hang antibiotic right afterwards.

## 2022-12-31 NOTE — Consult Note (Signed)
PHARMACY -  BRIEF ANTIBIOTIC NOTE   Pharmacy has received consult(s) for vancomycin and cefepime from an ED provider.  The patient's profile has been reviewed for ht/wt/allergies/indication/available labs.    One time order(s) placed for vancomycin 1500 mg IV x 1 and cefepime 2 gm IV x 1.   Further antibiotics/pharmacy consults should be ordered by admitting physician if indicated.                       Thank you,  Littie Deeds, PharmD Pharmacy Resident  12/31/2022 11:11 AM

## 2022-12-31 NOTE — Consult Note (Signed)
Hematology/Oncology Consult note [phone] [phone]      Patient Care Team: Shane Crutch, [place] as PCP - General (Family Medicine) Hulen Luster, RN as Oncology Nurse Navigator [name], MD as Consulting Physician (Oncology)   Name of the patient: Penny Gomez  [id number]  [birth year]   REASON FOR COSULTATION:  Bacteremia  History of presenting illness-  37 y.o. female with PMH listed at below who was advised to go to emergency room for evaluation of bacteremia.  Patient was seen by me yesterday for febrile illness, fever of 101.2, feels cold.  No sore throat, mild cough.  Denies dysuria or urgency.  Outpatient workup showed negative respiratory panel including influenza and COVID-19. UA showed moderate hemoglobin, negative for nitrate, leukocytes.  Urine culture showed colonization/insignificant growth. Today preliminary blood culture came back positive for Staph aureus, patient was recommended to go to emergency room to be admitted and started on IV antibiotics.  Patient has a small area of dehiscence of her Mediport incision site.  Patient follows up with surgery for wound care.   In emergency room, patient had Tmax of 99.3, tachycardic with heart rate of 120.  BP stable. Patient was initiated on cefepime and vancomycin for infectious coverage, transition to IV cefazolin per ID recommendation. Surgery was consulted and evaluated patient.  It was felt that there Mediport may be the source of Staph aureus bacteremia.  Patient is status post Mediport removal.  12/31/2022, CT angiogram chest PE protocol showed possible small right lower lobe segmental to subsegmental pulmonary emboli.  Of questionable clinical significance given volume.  Right lower lobe airspace and groundglass opacity favoring pneumonia.  No evidence of metastatic disease in the chest.  Distal esophageal wall thickening suspicious for esophagitis.  CT abdomen and pelvis impression showed  multifactorial degraded evaluation.  6 mm right hepatic lobe lesion which is too small to characterize.  Felt to be new since prior examination.  Favored to represent a cyst but technically indeterminate.  Otherwise no evidence of metastatic disease in abdomen or pelvis.  Minimal bilateral caliectasis, fibroid uterus, right-sided hydrosalpinx.  Patient reports being compliant with Eliquis.   No Known Allergies  Patient Active Problem List   Diagnosis Date Noted   Invasive carcinoma of breast (HCC) 10/10/2022    Priority: High   Acute febrile illness 12/30/2022    Priority: Medium    Hypokalemia 12/06/2022    Priority: Medium    Acute pulmonary embolism (HCC) 11/24/2022    Priority: Medium    BRCA1 gene mutation positive 11/11/2022    Priority: Medium    Encounter for antineoplastic chemotherapy 10/25/2022    Priority: Medium    IDA (iron deficiency anemia) 10/10/2022    Priority: Medium    Port-A-Cath in place 10/25/2022    Priority: Low   Goals of care, counseling/discussion 10/10/2022    Priority: Low   Family history of cancer 10/10/2022    Priority: Low   Bacteremia 12/31/2022   Sepsis (HCC) 12/31/2022   Vascular port complication 11/24/2022   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) 11/24/2022   Multifocal pneumonia 11/24/2022   Pulmonary infarct (HCC) 11/24/2022   Genetic testing 11/11/2022   Urinary frequency 03/19/2022     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  Past Surgical History:  Procedure Laterality Date   BREAST BIOPSY Right 05/20/2019   Affirm Bx- X- clip, neg   BREAST BIOPSY Left 10/02/2022   [place] Bx, path pending   BREAST BIOPSY Left 10/02/2022   [place] Bx Node- path pending   BREAST BIOPSY Left 10/02/2022   [place] LT BREAST BX W LOC DEV 1ST LESION IMG BX SPEC US GUIDE 10/02/2022  [facility]   CESAREAN SECTION     LAPAROSCOPIC BILATERAL SALPINGECTOMY Bilateral 02/09/2022   Procedure: LAPAROSCOPIC BILATERAL SALPINGECTOMY;  Surgeon: Hildred Laser, MD;  Location: [facility];  Service: Gynecology;  Laterality: Bilateral;   PORTACATH PLACEMENT Right 10/17/2022   Procedure: INSERTION PORT-A-CATH;  Surgeon: Henrene Dodge, MD;  Location: [facility];  Service: General;  Laterality: Right;   TUBAL LIGATION      Social History   Socioeconomic History   Marital status: Single    Spouse name: Not on file   Number of children: 2   Years of education: Not on file   Highest education level: High school graduate  Occupational History   Not on file  Tobacco Use   Smoking status: Never    Passive exposure: Never   Smokeless tobacco: Never  Vaping Use   Vaping status: Never Used  Substance and Sexual Activity   Alcohol use: Yes    Comment: occ   Drug use: No   Sexual activity: Yes    Birth control/protection: Surgical  Other Topics Concern   Not on file  Social History Narrative   Not on file   Social Determinants of Health   Financial Resource Strain: Low Risk  (10/10/2022)   Overall Financial Resource Strain (CARDIA)    Difficulty of Paying Living Expenses: Not very hard  Food Insecurity: No Food Insecurity (12/31/2022)   Hunger Vital Sign    Worried About Running Out of Food in the Last Year: Never true    Ran Out of Food in the Last Year: Never true  Transportation Needs: No Transportation Needs (12/31/2022)   PRAPARE - Administrator, Civil Service (Medical): No    Lack of Transportation (Non-Medical): No  Physical Activity: Not on file  Stress: No Stress Concern Present (10/10/2022)   [employer] of Occupational Health - Occupational Stress Questionnaire    Feeling of Stress : Only a little  Social Connections: Not on file  Intimate Partner Violence: Not At Risk (12/31/2022)   Humiliation, Afraid, Rape, and Kick questionnaire    Fear of  Current or Ex-Partner: No    Emotionally Abused: No    Physically Abused: No    Sexually Abused: No     Family History  Problem Relation Age of Onset   Breast cancer Mother 24       no GT   Breast cancer Maternal Aunt 33   Breast cancer Maternal Aunt    Lung cancer Maternal Uncle    Lung cancer Maternal Uncle    Bone cancer Paternal Aunt    Pancreatic cancer Maternal Grandmother 60   Lung cancer Maternal Grandfather 42   Breast cancer Cousin 40   Breast cancer Other 33       gene pos?     Current Facility-Administered Medications:    acetaminophen (TYLENOL) tablet 650 mg, 650 mg, Oral, Q6H PRN, Floydene Flock, MD, 650 mg at 12/31/22 1343   ceFAZolin (ANCEF) IVPB 2g/100 mL premix, 2 g, Intravenous, Q8H, Vu, Trung T, MD, Stopped at 12/31/22 1230   lactated ringers infusion, ,  Intravenous, Continuous, Floydene Flock, MD, Last Rate: 100 mL/hr at 12/31/22 1207, New Bag at 12/31/22 1207   ondansetron (ZOFRAN) tablet 4 mg, 4 mg, Oral, Q6H PRN **OR** ondansetron (ZOFRAN) injection 4 mg, 4 mg, Intravenous, Q6H PRN, Floydene Flock, MD  Current Outpatient Medications:    Calcium Carb-Cholecalciferol 600-10 MG-MCG TABS, Take 1 tablet by mouth 2 (two) times daily., Disp: , Rfl:    acetaminophen (TYLENOL) 500 MG tablet, Take 2 tablets (1,000 mg total) by mouth every 6 (six) hours as needed for mild pain., Disp: , Rfl:    amoxicillin-clavulanate (AUGMENTIN) 875-125 MG tablet, Take 1 tablet by mouth 2 (two) times daily., Disp: 10 tablet, Rfl: 0   apixaban (ELIQUIS) 5 MG TABS tablet, Take 1 tablet (5 mg total) by mouth 2 (two) times daily. Start AFTER completing your starter pack, Disp: 60 tablet, Rfl: 1   Calcium Carbonate-Vitamin D (CALCIUM 600 +D HIGH POTENCY) 600-10 MG-MCG TABS, Take 1 tablet by mouth 2 (two) times daily., Disp: 60 tablet, Rfl: 3   lidocaine-prilocaine (EMLA) cream, Apply 1 Application topically as needed., Disp: 30 g, Rfl: 11   LORazepam (ATIVAN) 0.5 MG tablet, Take 1  tablet (0.5 mg total) by mouth every 8 (eight) hours as needed for anxiety (nausea or vomiting)., Disp: 30 tablet, Rfl: 0   ondansetron (ZOFRAN) 8 MG tablet, Take 1 tablet (8 mg total) by mouth See admin instructions. If needed, start on Day 3 of chemotherapy, take 1 tablet every 8 hours as needed for nausea or vomiting., Disp: 90 tablet, Rfl: 0   potassium chloride SA (KLOR-CON M) 20 MEQ tablet, Take 1 tablet (20 mEq total) by mouth daily., Disp: 30 tablet, Rfl: 0   prochlorperazine (COMPAZINE) 10 MG tablet, Take 1 tablet (10 mg total) by mouth every 6 (six) hours as needed for nausea or vomiting., Disp: 90 tablet, Rfl: 3   senna (SENOKOT) 8.6 MG TABS tablet, Take 2 tablets (17.2 mg total) by mouth daily., Disp: 120 tablet, Rfl: 2  Review of Systems  Constitutional:  Positive for fatigue and fever. Negative for appetite change and chills.  HENT:   Negative for hearing loss and voice change.   Eyes:  Negative for eye problems.  Respiratory:  Positive for cough. Negative for chest tightness.   Cardiovascular:  Negative for chest pain.  Gastrointestinal:  Negative for abdominal distention, abdominal pain and blood in stool.  Endocrine: Negative for hot flashes.  Genitourinary:  Negative for difficulty urinating and frequency.   Musculoskeletal:  Negative for arthralgias.  Skin:  Negative for itching and rash.  Neurological:  Positive for headaches. Negative for extremity weakness.  Hematological:  Negative for adenopathy.  Psychiatric/Behavioral:  Negative for confusion.     PHYSICAL EXAM Vitals:   12/31/22 1400 12/31/22 1402 12/31/22 1430 12/31/22 1444  BP: 117/67  92/70   Pulse: (!) 137  (!) 127   Resp: (!) 26 20 (!) 25   Temp:    (!) 102.2 F (39 C)  TempSrc:    Oral  SpO2: 99%  98%   Weight:      Height:       Physical Exam Constitutional:      General: She is not in acute distress.    Appearance: She is not diaphoretic.  HENT:     Head: Normocephalic and atraumatic.  Eyes:      General: No scleral icterus.    Pupils: Pupils are equal, round, and reactive to light.  Cardiovascular:  Rate and Rhythm: Regular rhythm. Tachycardia present.     Heart sounds: No murmur heard. Pulmonary:     Effort: Pulmonary effort is normal. No respiratory distress.  Abdominal:     General: There is no distension.     Palpations: Abdomen is soft.     Tenderness: There is no abdominal tenderness.  Musculoskeletal:        General: Normal range of motion.     Cervical back: Normal range of motion and neck supple.  Skin:    General: Skin is warm and dry.     Findings: No erythema.     Comments: Status post Mediport removal.  Neurological:     Mental Status: She is alert and oriented to person, place, and time.     Cranial Nerves: No cranial nerve deficit.     Motor: No abnormal muscle tone.     Coordination: Coordination normal.  Psychiatric:        Mood and Affect: Affect normal.       LABORATORY STUDIES    Latest Ref Rng & Units 12/31/2022   11:17 AM 12/30/2022    8:28 AM 12/20/2022    8:59 AM  CBC  WBC 4.0 - 10.5 K/uL 19.6  10.4  4.2   Hemoglobin 12.0 - 15.0 g/dL 16.1  09.6  04.5   Hematocrit 36.0 - 46.0 % 37.8  36.1  32.7   Platelets 150 - 400 K/uL 136  184  329       Latest Ref Rng & Units 12/31/2022   11:17 AM 12/30/2022    8:28 AM 12/20/2022    8:59 AM  CMP  Glucose 70 - 99 mg/dL 409  811  94   BUN 6 - 20 mg/dL 9  9  17    Creatinine 0.44 - 1.00 mg/dL 9.14  7.82  9.56   Sodium 135 - 145 mmol/L 131  135  138   Potassium 3.5 - 5.1 mmol/L 3.4  3.6  4.2   Chloride 98 - 111 mmol/L 97  101  108   CO2 22 - 32 mmol/L 24  22  27    Calcium 8.9 - 10.3 mg/dL 9.2  8.8  9.0   Total Protein 6.5 - 8.1 g/dL 8.7  7.6  7.0   Total Bilirubin <1.2 mg/dL 1.2  0.3  0.2   Alkaline Phos 38 - 126 U/L 106  115  73   AST 15 - 41 U/L 60  32  17   ALT 0 - 44 U/L 68  33  19      RADIOGRAPHIC STUDIES: I have personally reviewed the radiological images as listed and agreed  with the findings in the report. CT Angio Chest Pulmonary Embolism (PE) W or WO Contrast  Result Date: 12/31/2022 CLINICAL DATA:  Elevated liver function tests. Fever. Abdominal pain. Positive blood culture. * Tracking Code: BO * breast cancer. EXAM: CT ANGIOGRAPHY CHEST CT ABDOMEN AND PELVIS WITH CONTRAST TECHNIQUE: Multidetector CT imaging of the chest was performed using the standard protocol during bolus administration of intravenous contrast. Multiplanar CT image reconstructions and MIPs were obtained to evaluate the vascular anatomy. Multidetector CT imaging of the abdomen and pelvis was performed using the standard protocol during bolus administration of intravenous contrast. RADIATION DOSE REDUCTION: This exam was performed according to the departmental dose-optimization program which includes automated exposure control, adjustment of the mA and/or kV according to patient size and/or use of iterative reconstruction technique. CONTRAST:  75mL OMNIPAQUE IOHEXOL 350 MG/ML  SOLN COMPARISON:  03/20/2020 abdominopelvic CT. FINDINGS: CTA CHEST FINDINGS Cardiovascular: The quality of this exam for evaluation of pulmonary embolism is poor to moderate. The bolus is suboptimally timed, centered in the SVC. In addition, there is minimal motion. No central or large lobar pulmonary embolism. Possible small volume pulmonary emboli to the right lower lobe, including within segmental branches on 140/5. Right Port-A-Cath tip high right atrium. subtle soft tissue fullness at the Port-A-Cath insertion site in the neck including on 08/04 is likely related to recent port placement. Normal aortic caliber. Mild cardiomegaly, without pericardial effusion. Mediastinum/Nodes: No axillary or subpectoral adenopathy. No mediastinal or hilar adenopathy. Distal esophageal wall thickening is mild-to-moderate on 81/4. No internal mammary adenopathy. Lungs/Pleura: No pleural fluid. Minimal motion degradation in the lower chest. Dependent  right lower lobe airspace and ground-glass opacity. 2 mm subpleural right upper lobe pulmonary nodule on 34/6, nonspecific. Right middle lobe pulmonary nodules x2 including up to maximally 3 mm on 71/6 were present on 03/20/2020 and considered benign. Musculoskeletal: No acute osseous abnormality. Review of the MIP images confirms the above findings. CT ABDOMEN and PELVIS FINDINGS Hepatobiliary: Mild limitations secondary to patient arm position, EKG wires and lead artifacts. Hepatomegaly at 21.4 cm. 6 mm subcapsular right hepatic lobe lesion is not readily apparent on the prior. A central left hepatic lobe 4 mm hypoattenuating lesion is present on the prior and likely a cyst. Normal gallbladder, without biliary ductal dilatation. Pancreas: Normal, without mass or ductal dilatation. Spleen: Normal in size, without focal abnormality. Adrenals/Urinary Tract: Normal adrenal glands. Minimal bilateral caliectasis without significant hydroureter. The bladder is mildly distended, likely causative. Appearance is relatively similar to 03/20/2020. Stomach/Bowel: Normal stomach, without wall thickening. Normal large and small bowel loops. Vascular/Lymphatic: Normal caliber of the aorta and branch vessels. No abdominopelvic adenopathy. Reproductive: Fibroid uterus again identified. Uterine masses up to 3.4 cm. Tubular right adnexal cystic structure is again favored to represent hydrosalpinx. Relatively similar at up to 2.5 cm on 79/11. Other: Trace free pelvic fluid is likely physiologic. No abdominal ascites or free intraperitoneal air. Musculoskeletal: No acute osseous abnormality. Review of the MIP images confirms the above findings. IMPRESSION: CT CHEST IMPRESSION 1. Poor to moderate quality evaluation for pulmonary embolism. Possible small right lower lobe segmental to subsegmental pulmonary emboli. Of questionable clinical significance, given volume. 2. Right lower lobe airspace and ground-glass opacity, favoring  pneumonia. If the patient does not have infectious symptoms, infarct is a secondary consideration. 3. No evidence of metastatic disease in the chest 4. Distal esophageal wall thickening, suspicious for esophagitis. CT ABDOMEN AND PELVIS IMPRESSION 1. Multifactorial degraded evaluation of the abdomen and pelvis. 2. 6 mm right hepatic lobe lesion is too small to characterize, felt to be new since the prior exam. Favored to represent a cyst but technically indeterminate. Potential clinical strategies include follow-up on routine screening CTs versus further evaluation with nonemergent outpatient pre and post-contrast MRI. 3. Otherwise, no evidence of metastatic disease in the abdomen or pelvis. 4. Minimal bilateral caliectasis, similar and likely related to bladder distention. 5. Fibroid uterus 6. Right-sided hydrosalpinx, similar. Electronically Signed   By: Jeronimo Greaves M.D.   On: 12/31/2022 15:07   CT ABDOMEN PELVIS W CONTRAST  Result Date: 12/31/2022 CLINICAL DATA:  Elevated liver function tests. Fever. Abdominal pain. Positive blood culture. * Tracking Code: BO * breast cancer. EXAM: CT ANGIOGRAPHY CHEST CT ABDOMEN AND PELVIS WITH CONTRAST TECHNIQUE: Multidetector CT imaging of the chest was performed using the standard protocol during  bolus administration of intravenous contrast. Multiplanar CT image reconstructions and MIPs were obtained to evaluate the vascular anatomy. Multidetector CT imaging of the abdomen and pelvis was performed using the standard protocol during bolus administration of intravenous contrast. RADIATION DOSE REDUCTION: This exam was performed according to the departmental dose-optimization program which includes automated exposure control, adjustment of the mA and/or kV according to patient size and/or use of iterative reconstruction technique. CONTRAST:  75mL OMNIPAQUE IOHEXOL 350 MG/ML SOLN COMPARISON:  03/20/2020 abdominopelvic CT. FINDINGS: CTA CHEST FINDINGS Cardiovascular: The  quality of this exam for evaluation of pulmonary embolism is poor to moderate. The bolus is suboptimally timed, centered in the SVC. In addition, there is minimal motion. No central or large lobar pulmonary embolism. Possible small volume pulmonary emboli to the right lower lobe, including within segmental branches on 140/5. Right Port-A-Cath tip high right atrium. subtle soft tissue fullness at the Port-A-Cath insertion site in the neck including on 08/04 is likely related to recent port placement. Normal aortic caliber. Mild cardiomegaly, without pericardial effusion. Mediastinum/Nodes: No axillary or subpectoral adenopathy. No mediastinal or hilar adenopathy. Distal esophageal wall thickening is mild-to-moderate on 81/4. No internal mammary adenopathy. Lungs/Pleura: No pleural fluid. Minimal motion degradation in the lower chest. Dependent right lower lobe airspace and ground-glass opacity. 2 mm subpleural right upper lobe pulmonary nodule on 34/6, nonspecific. Right middle lobe pulmonary nodules x2 including up to maximally 3 mm on 71/6 were present on 03/20/2020 and considered benign. Musculoskeletal: No acute osseous abnormality. Review of the MIP images confirms the above findings. CT ABDOMEN and PELVIS FINDINGS Hepatobiliary: Mild limitations secondary to patient arm position, EKG wires and lead artifacts. Hepatomegaly at 21.4 cm. 6 mm subcapsular right hepatic lobe lesion is not readily apparent on the prior. A central left hepatic lobe 4 mm hypoattenuating lesion is present on the prior and likely a cyst. Normal gallbladder, without biliary ductal dilatation. Pancreas: Normal, without mass or ductal dilatation. Spleen: Normal in size, without focal abnormality. Adrenals/Urinary Tract: Normal adrenal glands. Minimal bilateral caliectasis without significant hydroureter. The bladder is mildly distended, likely causative. Appearance is relatively similar to 03/20/2020. Stomach/Bowel: Normal stomach, without  wall thickening. Normal large and small bowel loops. Vascular/Lymphatic: Normal caliber of the aorta and branch vessels. No abdominopelvic adenopathy. Reproductive: Fibroid uterus again identified. Uterine masses up to 3.4 cm. Tubular right adnexal cystic structure is again favored to represent hydrosalpinx. Relatively similar at up to 2.5 cm on 79/11. Other: Trace free pelvic fluid is likely physiologic. No abdominal ascites or free intraperitoneal air. Musculoskeletal: No acute osseous abnormality. Review of the MIP images confirms the above findings. IMPRESSION: CT CHEST IMPRESSION 1. Poor to moderate quality evaluation for pulmonary embolism. Possible small right lower lobe segmental to subsegmental pulmonary emboli. Of questionable clinical significance, given volume. 2. Right lower lobe airspace and ground-glass opacity, favoring pneumonia. If the patient does not have infectious symptoms, infarct is a secondary consideration. 3. No evidence of metastatic disease in the chest 4. Distal esophageal wall thickening, suspicious for esophagitis. CT ABDOMEN AND PELVIS IMPRESSION 1. Multifactorial degraded evaluation of the abdomen and pelvis. 2. 6 mm right hepatic lobe lesion is too small to characterize, felt to be new since the prior exam. Favored to represent a cyst but technically indeterminate. Potential clinical strategies include follow-up on routine screening CTs versus further evaluation with nonemergent outpatient pre and post-contrast MRI. 3. Otherwise, no evidence of metastatic disease in the abdomen or pelvis. 4. Minimal bilateral caliectasis, similar and likely related  to bladder distention. 5. Fibroid uterus 6. Right-sided hydrosalpinx, similar. Electronically Signed   By: Jeronimo Greaves M.D.   On: 12/31/2022 15:07   CT Angio Chest PE W and/or Wo Contrast  Addendum Date: 11/25/2022   ADDENDUM REPORT: 11/25/2022 18:42 ADDENDUM: These results were called by telephone at the time of interpretation on  11/24/2022 at 8:50 pm to provider [facility] , who verbally acknowledged these results. Electronically Signed   By: Darliss Cheney M.D.   On: 11/25/2022 18:42   Result Date: 11/25/2022 CLINICAL DATA:  High probability for PE. EXAM: CT ANGIOGRAPHY CHEST WITH CONTRAST TECHNIQUE: Multidetector CT imaging of the chest was performed using the standard protocol during bolus administration of intravenous contrast. Multiplanar CT image reconstructions and MIPs were obtained to evaluate the vascular anatomy. RADIATION DOSE REDUCTION: This exam was performed according to the departmental dose-optimization program which includes automated exposure control, adjustment of the mA and/or kV according to patient size and/or use of iterative reconstruction technique. CONTRAST:  75mL OMNIPAQUE IOHEXOL 350 MG/ML SOLN COMPARISON:  None Available. FINDINGS: Cardiovascular: Pulmonary emboli are seen within the distal right main pulmonary artery extending into segmental branches of the right lower lobe and right middle lobe. Heart is normal in size. Aorta is normal in size. Right chest port catheter tip ends in the right atrium. There is no pericardial effusion. Mediastinum/Nodes: No enlarged mediastinal, hilar, or axillary lymph nodes. Thyroid gland, trachea, and esophagus demonstrate no significant findings. Lungs/Pleura: Multifocal patchy ground-glass and airspace opacities are seen in the right middle lobe and right lower lobe. There is a small peripheral opacity in the posterior right lower lobe which may represent pulmonary infarct. There is no pleural effusion or pneumothorax. Upper Abdomen: No acute abnormality. Musculoskeletal: No chest wall abnormality. No acute or significant osseous findings. Review of the MIP images confirms the above findings. IMPRESSION: 1. Pulmonary emboli within the distal right main pulmonary artery extending into segmental branches of the right lower lobe and right middle lobe. No evidence for right  heart strain. 2. Multifocal patchy ground-glass and airspace opacities in the right middle lobe and right lower lobe worrisome for multifocal pneumonia. 3. Small peripheral opacity in the posterior right lower lobe may represent pulmonary infarct. Electronically Signed: By: Darliss Cheney M.D. On: 11/24/2022 20:49   US Venous Img Lower Bilateral (DVT)  Result Date: 11/25/2022 CLINICAL DATA:  37 year old female with history of pulmonary embolism EXAM: BILATERAL LOWER EXTREMITY VENOUS DOPPLER ULTRASOUND TECHNIQUE: Gray-scale sonography with graded compression, as well as color Doppler and duplex ultrasound were performed to evaluate the lower extremity deep venous systems from the level of the common femoral vein and including the common femoral, femoral, profunda femoral, popliteal and calf veins including the posterior tibial, peroneal and gastrocnemius veins when visible. The superficial great saphenous vein was also interrogated. Spectral Doppler was utilized to evaluate flow at rest and with distal augmentation maneuvers in the common femoral, femoral and popliteal veins. COMPARISON:  None Available. FINDINGS: RIGHT LOWER EXTREMITY Common Femoral Vein: No evidence of thrombus. Normal compressibility, respiratory phasicity and response to augmentation. Saphenofemoral Junction: No evidence of thrombus. Normal compressibility and flow on color Doppler imaging. Profunda Femoral Vein: No evidence of thrombus. Normal compressibility and flow on color Doppler imaging. Femoral Vein: No evidence of thrombus. Normal compressibility, respiratory phasicity and response to augmentation. Popliteal Vein: No evidence of thrombus. Normal compressibility, respiratory phasicity and response to augmentation. Calf Veins: No evidence of thrombus. Normal compressibility and flow on color Doppler imaging.  Superficial Great Saphenous Vein: No evidence of thrombus. Normal compressibility and flow on color Doppler imaging. Other  Findings:  None. LEFT LOWER EXTREMITY Common Femoral Vein: No evidence of thrombus. Normal compressibility, respiratory phasicity and response to augmentation. Saphenofemoral Junction: No evidence of thrombus. Normal compressibility and flow on color Doppler imaging. Profunda Femoral Vein: No evidence of thrombus. Normal compressibility and flow on color Doppler imaging. Femoral Vein: No evidence of thrombus. Normal compressibility, respiratory phasicity and response to augmentation. Popliteal Vein: No evidence of thrombus. Normal compressibility, respiratory phasicity and response to augmentation. Calf Veins: No evidence of thrombus. Normal compressibility and flow on color Doppler imaging. Superficial Great Saphenous Vein: No evidence of thrombus. Normal compressibility and flow on color Doppler imaging. Other Findings:  None. IMPRESSION: Directed duplex of the bilateral lower extremities negative for DVT Signed, Yvone Neu. Miachel Roux, RPVI [facility] Radiology Electronically Signed   By: Gilmer Mor D.O.   On: 11/25/2022 09:54   DG Chest 2 View  Result Date: 11/24/2022 CLINICAL DATA:  Bleeding from port site. EXAM: CHEST - 2 VIEW COMPARISON:  Chest x-ray dated October 17, 2022. FINDINGS: Right chest wall port catheter in unchanged position with the tip in the proximal SVC. The heart size and mediastinal contours are within normal limits. Normal pulmonary vascularity. New patchy opacities in the right lower lobe. Clear left lung. No pleural effusion or pneumothorax. No acute osseous abnormality. IMPRESSION: 1. Unchanged position of the right chest wall port catheter. 2. New right lower lobe pneumonia. Electronically Signed   By: Obie Dredge M.D.   On: 11/24/2022 16:41   DG Chest Port 1 View  Result Date: 10/17/2022 CLINICAL DATA:  Port-A-Cath placement EXAM: PORTABLE CHEST 1 VIEW COMPARISON:  None. FINDINGS: Right chest wall port with tip at the  expected area of the upper SVC. Cardiac and mediastinal contours are within normal limits for AP technique. Low lung volumes. Lungs are clear. No large pleural effusion or evidence of pneumothorax. IMPRESSION: Right chest wall port with tip at the the upper SVC. Electronically Signed   By: Allegra Lai M.D.   On: 10/17/2022 19:48   DG C-Arm 1-60 Min-No Report  Result Date: 10/17/2022 Fluoroscopy was utilized by the requesting physician.  No radiographic interpretation.   ECHOCARDIOGRAM COMPLETE  Result Date: 10/17/2022    ECHOCARDIOGRAM REPORT   Patient Name:   LATESE LUEPKE Date of Exam: 10/17/2022 Medical Rec #:  [id number]       Height:       60.0 in Accession #:    [id number]      Weight:       135.4 lb Date of Birth:  12-25-1985        BSA:          1.581 m Patient Age:    37 years        BP:           113/64 mmHg Patient Gender: F               HR:           62 bpm. Exam Location:  [facility] Procedure: 2D Echo, Cardiac Doppler, Color Doppler and Strain Analysis Indications:     Chemo Z09  History:         Patient has no prior history of Echocardiogram examinations. No                  cardiac history on file.  Sonographer:     Cristela Blue Referring Phys:  [id number] Khan Chura Diagnosing Phys: Julien Nordmann MD  Sonographer Comments: Global longitudinal strain was attempted. IMPRESSIONS  1. Left ventricular ejection fraction, by estimation, is 60 to 65%. The left ventricle has normal function. The left ventricle has no regional wall motion abnormalities. Left ventricular diastolic parameters were normal.  2. Right ventricular systolic function is normal. The right ventricular size is normal.  3. The mitral valve is normal in structure. No evidence of mitral valve regurgitation. No evidence of mitral stenosis.  4. The aortic valve is tricuspid. Aortic valve regurgitation is not visualized. No aortic stenosis is present.  5. The inferior vena cava is normal in size with greater than 50% respiratory variability,  suggesting right atrial pressure of 3 mmHg. FINDINGS  Left Ventricle: Left ventricular ejection fraction, by estimation, is 60 to 65%. The left ventricle has normal function. The left ventricle has no regional wall motion abnormalities. The left ventricular internal cavity size was normal in size. There is  no left ventricular hypertrophy. Left ventricular diastolic parameters were normal. Right Ventricle: The right ventricular size is normal. No increase in right ventricular wall thickness. Right ventricular systolic function is normal. Left Atrium: Left atrial size was normal in size. Right Atrium: Right atrial size was normal in size. Pericardium: There is no evidence of pericardial effusion. Mitral Valve: The mitral valve is normal in structure. No evidence of mitral valve regurgitation. No evidence of mitral valve stenosis. MV peak gradient, 4.4 mmHg. The mean mitral valve gradient is 1.0 mmHg. Tricuspid Valve: The tricuspid valve is normal in structure. Tricuspid valve regurgitation is not demonstrated. No evidence of tricuspid stenosis. Aortic Valve: The aortic valve is tricuspid. Aortic valve regurgitation is not visualized. No aortic stenosis is present. Aortic valve mean gradient measures 3.0 mmHg. Aortic valve peak gradient measures 5.7 mmHg. Aortic valve area, by VTI measures 3.13 cm. Pulmonic Valve: The pulmonic valve was normal in structure. Pulmonic valve regurgitation is not visualized. No evidence of pulmonic stenosis. Aorta: The aortic root is normal in size and structure. Venous: The inferior vena cava is normal in size with greater than 50% respiratory variability, suggesting right atrial pressure of 3 mmHg. IAS/Shunts: No atrial level shunt detected by color flow Doppler.  LEFT VENTRICLE PLAX 2D LVIDd:         4.10 cm   Diastology LVIDs:         2.40 cm   LV e' medial:    12.80 cm/s LV PW:         0.80 cm   LV E/e' medial:  8.2 LV IVS:        0.90 cm   LV e' lateral:   13.30 cm/s LVOT diam:      2.10 cm   LV E/e' lateral: 7.9 LV SV:         78 LV SV Index:   49 LVOT Area:     3.46 cm  RIGHT VENTRICLE RV Basal diam:  3.60 cm RV Mid diam:    2.70 cm RV S prime:     12.60 cm/s LEFT ATRIUM             Index        RIGHT ATRIUM          Index LA diam:        2.80 cm 1.77 cm/m   RA Area:     8.25 cm LA Vol (A2C):   28.3 ml  17.90 ml/m  RA Volume:   13.40 ml 8.47 ml/m LA Vol (A4C):   40.3 ml 25.48 ml/m LA Biplane Vol: 36.4 ml 23.02 ml/m  AORTIC VALVE AV Area (Vmax):    2.94 cm AV Area (Vmean):   3.05 cm AV Area (VTI):     3.13 cm AV Vmax:           119.00 cm/s AV Vmean:          81.300 cm/s AV VTI:            0.250 m AV Peak Grad:      5.7 mmHg AV Mean Grad:      3.0 mmHg LVOT Vmax:         101.00 cm/s LVOT Vmean:        71.600 cm/s LVOT VTI:          0.226 m LVOT/AV VTI ratio: 0.90  AORTA Ao Root diam: 2.40 cm MITRAL VALVE                TRICUSPID VALVE MV Area (PHT): 3.05 cm     TR Peak grad:   13.4 mmHg MV Area VTI:   2.47 cm     TR Vmax:        183.00 cm/s MV Peak grad:  4.4 mmHg MV Mean grad:  1.0 mmHg     SHUNTS MV Vmax:       1.05 m/s     Systemic VTI:  0.23 m MV Vmean:      47.5 cm/s    Systemic Diam: 2.10 cm MV Decel Time: 249 msec MV E velocity: 105.00 cm/s MV A velocity: 62.90 cm/s MV E/A ratio:  1.67 Julien Nordmann MD Electronically signed by Julien Nordmann MD Signature Date/Time: 10/17/2022/11:30:44 AM    Final    MR BREAST BILATERAL W WO CONTRAST INC CAD  Result Date: 10/17/2022 CLINICAL DATA:  37 year old female who presented with a palpable lump in the left breast in August of 2024. 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 A left axillary lymph node was biopsied demonstrating reactive changes, without evidence of malignancy. The patient has strong family history of breast cancer. EXAM: BILATERAL BREAST MRI WITH AND WITHOUT CONTRAST TECHNIQUE: Multiplanar, multisequence MR images of both breasts were  obtained prior to and following the intravenous administration of 6 ml of Gadavist Three-dimensional MR images were rendered by post-processing of the original MR data on an independent workstation. The three-dimensional MR images were interpreted, and findings are reported in the following complete MRI report for this study. Three dimensional images were evaluated at the independent interpreting workstation using the DynaCAD thin client. COMPARISON:  No prior MRI available for comparison. Correlation made with prior mammogram and ultrasound images. FINDINGS: Breast composition: c. Heterogeneous fibroglandular tissue. Background parenchymal enhancement: Minimal Right breast: No mass or abnormal enhancement. Left breast: In the lower-inner quadrant of the left breast, there is an enhancing mass with surrounding non mass enhancement measuring 3.8 cm. Additionally, there are several scattered small scattered enhancing masses in the inferior anterior left breast, in the lower outer and lower inner quadrants (series 14, images 30-36). Together with the area of biopsy proven malignancy, the abnormal enhancement spans approximately 6.7 cm. The biopsy marking clip from the ultrasound-guided biopsy is positioned centrally within this broader area of abnormal enhancement. Lymph nodes: The axillae were not included within the field of view on imaging for adequate assessment of the lymph nodes.  No abnormal lymph nodes seen along the internal mammary chain. Ancillary findings:  None. IMPRESSION: 1. In total, there is approximately 6.7 cm of abnormal enhancement, predominantly in the lower-inner inferior left breast, with the ultrasound-guided biopsy marking clip centrally positioned within the enhancement. There are scattered small enhancing masses in the lower inner and lower outer anterior left (included in the measurement above). 2.  No evidence of right breast malignancy. 3. The axillary lymph nodes are not included within  the field of view of this MRI for adequate assessment of lymphadenopathy. RECOMMENDATION: MRI guided biopsy is recommended for the anterior and posterior aspect of non mass enhancement in the right breast to establish extent of disease, if the patient is considering breast conservation. BI-RADS CATEGORY  4: Suspicious. Electronically Signed   By: Frederico Hamman M.D.   On: 10/17/2022 10:40     Assessment and plan-   Staph aureus bacteremia, likely secondary to infected Mediport.  Status post port removal. Possible pneumonia ID recommendation was reviewed.  Obtain TTE Cardiology was consulted for further evaluation of TEE On  IV cefazolin pending blood culture    # Pulmonary embolism, Small volume pulmonary emboli to the right lower lobe. Patient reports being compliant with Eliquis 5 mg twice daily this may be residual PE Continue anticoagulation with Eliquis 5 mg twice daily.  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    Thank you for allowing me to participate in the care of this patient.   [name], MD, PhD Hematology Oncology 12/31/2022

## 2022-12-31 NOTE — ED Notes (Signed)
Pt has clear drainage from surgical site to R chest for port. Pt has dx breast cancer since 8/24. Pt reports 102 fever yesterday. Last chemo 2 weeks ago.

## 2022-12-31 NOTE — Consult Note (Signed)
Patient ID: Penny Gomez, female   DOB: 06-08-1985, 37 y.o.   MRN: [id number]  HPI [name] is a 37 y.o. female seen in consultation at the request of Dr. Fanny Bien. Does have a known history of left breast cancer and is started neoadjuvant therapy.  Dr. Aleen Campi place a Port-A-Cath 2 and half months ago and over the last 3 weeks she has been dealing with a dehiscence.  No reports of fever chills or to thrive Last chemotherapy was 2 weeks ago.  Port was still functional.  [place]man Fitch R groin MSSA bacteremia.  Ports chronic drainage from the port and now turns more purulent.  Also recently diagnosed with pulmonary embolism and currently on anticoagulation Did have a CT as well as a chest x-ray that have personally reviewed port seems to be in place with a small pocket of air surrounding it.  Temperature of 102.  She also reports nasal congestion malaise and cough.  Does have some intermittent pain in the right chest wall from port but is mild. On arrival to the emergency room she was clearly septic tachycardic in the 140s.  When I saw her she was febrile with a temperature of 103. Count is 19.6 CMP is normal except mild ovation of the LFTs likely from sepsis INR is 1.3  HPI  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    Past Surgical History:  Procedure Laterality Date   BREAST BIOPSY Right 05/20/2019   Affirm Bx- X- clip, neg   BREAST BIOPSY Left 10/02/2022   [place] Bx, path pending   BREAST BIOPSY Left 10/02/2022   [place] Bx Node- path pending   BREAST BIOPSY Left 10/02/2022   [place] LT BREAST BX W LOC DEV 1ST LESION IMG BX SPEC US GUIDE 10/02/2022 [facility]   CESAREAN SECTION     LAPAROSCOPIC BILATERAL SALPINGECTOMY Bilateral 02/09/2022   Procedure: LAPAROSCOPIC BILATERAL SALPINGECTOMY;  Surgeon: Hildred Laser, MD;  Location: [facility]  ORS;  Service: Gynecology;  Laterality: Bilateral;   PORTACATH PLACEMENT Right 10/17/2022   Procedure: INSERTION PORT-A-CATH;  Surgeon: Henrene Dodge, MD;  Location: [facility] ORS;  Service: General;  Laterality: Right;   TUBAL LIGATION      Family History  Problem Relation Age of Onset   Breast cancer Mother 48       no GT   Breast cancer Maternal Aunt 33   Breast cancer Maternal Aunt    Lung cancer Maternal Uncle    Lung cancer Maternal Uncle    Bone cancer Paternal Aunt    Pancreatic cancer Maternal Grandmother 70   Lung cancer Maternal Grandfather 14   Breast cancer Cousin 40   Breast cancer Other 33       gene pos?    Social History Social History   Tobacco Use   Smoking status: Never    Passive exposure: Never   Smokeless tobacco: Never  Vaping Use   Vaping status: Never Used  Substance Use Topics   Alcohol use: Yes    Comment: occ   Drug use: No    No Known Allergies  Current Facility-Administered Medications  Medication Dose Route Frequency Provider Last Rate Last Admin   acetaminophen (TYLENOL) tablet 650 mg  650 mg Oral Q6H PRN Floydene Flock, MD  ceFAZolin (ANCEF) IVPB 2g/100 mL premix  2 g Intravenous Q8H Vu, Trung T, MD 200 mL/hr at 12/31/22 1153 2 g at 12/31/22 1153   lactated ringers infusion   Intravenous Continuous Floydene Flock, MD 100 mL/hr at 12/31/22 1207 New Bag at 12/31/22 1207   ondansetron (ZOFRAN) tablet 4 mg  4 mg Oral Q6H PRN Floydene Flock, MD       Or   ondansetron [facility]) injection 4 mg  4 mg Intravenous Q6H PRN Floydene Flock, MD       Current Outpatient Medications  Medication Sig Dispense Refill   Calcium Carb-Cholecalciferol 600-10 MG-MCG TABS Take 1 tablet by mouth 2 (two) times daily.     acetaminophen (TYLENOL) 500 MG tablet Take 2 tablets (1,000 mg total) by mouth every 6 (six) hours as needed for mild pain.     amoxicillin-clavulanate (AUGMENTIN) 875-125 MG tablet Take 1 tablet by mouth 2 (two) times daily. 10 tablet  0   apixaban (ELIQUIS) 5 MG TABS tablet Take 1 tablet (5 mg total) by mouth 2 (two) times daily. Start AFTER completing your starter pack 60 tablet 1   Calcium Carbonate-Vitamin D (CALCIUM 600 +D HIGH POTENCY) 600-10 MG-MCG TABS Take 1 tablet by mouth 2 (two) times daily. 60 tablet 3   lidocaine-prilocaine (EMLA) cream Apply 1 Application topically as needed. 30 g 11   LORazepam (ATIVAN) 0.5 MG tablet Take 1 tablet (0.5 mg total) by mouth every 8 (eight) hours as needed for anxiety (nausea or vomiting). 30 tablet 0   ondansetron (ZOFRAN) 8 MG tablet Take 1 tablet (8 mg total) by mouth See admin instructions. If needed, start on Day 3 of chemotherapy, take 1 tablet every 8 hours as needed for nausea or vomiting. 90 tablet 0   potassium chloride SA (KLOR-CON M) 20 MEQ tablet Take 1 tablet (20 mEq total) by mouth daily. 30 tablet 0   prochlorperazine (COMPAZINE) 10 MG tablet Take 1 tablet (10 mg total) by mouth every 6 (six) hours as needed for nausea or vomiting. 90 tablet 3   senna (SENOKOT) 8.6 MG TABS tablet Take 2 tablets (17.2 mg total) by mouth daily. 120 tablet 2     Review of Systems Full ROS  was asked and was negative except for the information on the HPI  Physical Exam Blood pressure 98/73, pulse (!) 131, temperature 99.3 F (37.4 C), temperature source Oral, resp. rate (!) 21, height 5' (1.524 m), weight 64.3 kg, SpO2 100%. CONSTITUTIONAL: debilitated and tachycardic. EYES: Pupils are equal, round,  Sclera are non-icteric. EARS, NOSE, MOUTH AND THROAT: The oropharynx is clear. The oral mucosa is pink and moist. Hearing is intact to voice. LYMPH NODES:  Lymph nodes in the neck are normal. RESPIRATORY:  Lungs are clear. There is normal respiratory effort, with equal breath sounds bilaterally, and without pathologic use of accessory muscles. CARDIOVASCULAR: Heart is regular tachy  without murmurs, gallops, or rubs. CHest: Obvious dehiscence and purulent drainage from right port with  tenderness to palpation this is consistent with a clear-cut port infection GI: The abdomen is  soft, nontender, and nondistended. There are no palpable masses. There is no hepatosplenomegaly. There are normal bowel sounds in all quadrants. GU: Rectal deferred.   MUSCULOSKELETAL: Normal muscle strength and tone. No cyanosis or edema.   SKIN: Turgor is good and there are no pathologic skin lesions or ulcers. NEUROLOGIC: Motor and sensation is grossly normal. Cranial nerves are grossly intact. PSYCH:  Oriented to person, place  and time. Affect is normal.  Data Reviewed  I have personally reviewed the patient's imaging, laboratory findings and medical records.    Assessment/Plan For cath infection.  We need to remove this as soon as possible to address source control.  I do think that we will be reasonable to remove it here in the ER under local.  Procedure discussed with the patient and the mother in detail.  Risk, benefits and possible complications.  Although she is anticoagulated the benefits of port removal is vastly outweighed the potential risk of bleeding. They understand and wish to proceed  PROCEDURE NOTE Removal of port  Anesthesia: lidocaine 1% 10cc  EBL minimal  After prepping the chest she was draped and prepped in the standard fashion.  Lidocaine was infiltrated and using a knife the remaining of the wound was extended.  We were able to locate the port and his capsule and incised the capsule.  The 2 Prolene sutures were cut and the port was removed after having the patient do a Valsalva maneuver and compressing the right internal jugular vein.  Pressure was applied and good hemostasis is achieved.  I placed a large Band-Aid.  No complications Please change Band-Aid daily.  We will let the wound heal by secondary intention   Sterling Big, MD FACS General Surgeon 12/31/2022, 1:05 PM

## 2022-12-31 NOTE — H&P (Addendum)
History and Physical    Patient: Penny Gomez ZOX:[id number] DOB: March 20, 1985 DOA: 12/31/2022 DOS: the patient was seen and examined on 12/31/2022 PCP: Shane Crutch, PA  Patient coming from: Home  Chief Complaint:  Chief Complaint  Patient presents with   abnormal labs   HPI: Penny Gomez is a 37 y.o. female with medical history significant of left breast cancer being followed by Dr. Cathie Hoops outpatient, pulmonary embolism on Eliquis, presenting with sepsis, Staph aureus bacteremia, infected port.  Patient noted have been evaluated with oncology yesterday for febrile illness.  Noted had a temperature one 1.2 in clinic.  Per report, patient had elevated temperatures at home.  Mild cough and malaise.  Positive rhinorrhea and nasal congestion.  Currently on active chemotherapy including carbo/taxol/Keytruda.  Had fairly extensive workup including blood cultures, respiratory panel, chest x-ray, urinalysis.  Was prophylactically placed on Augmentin for treatment.  Blood cultures came back positive for Staph aureus.  Was redirected to the ER for further evaluation.  Patient reports persistence of cough.  Has been compliant with Eliquis for PE.  Noted chronic serous drainage from right upper extremity port.  Minimal pain.  Minimal redness.  No abdominal pain.  Positive diarrhea x 1.  Had respiratory panel done yesterday which was negative.  Chest x-ray also performed though not fully read and relatively nondiagnostic at present. Presented to ER Tmax 99.3, heart rate 120s.  BP stable.  Satting well on room air.  Labs including CBC, CMP, lactate pending.  Initially placed on cefepime and vancomycin for infectious coverage.  Transition to IV cefazolin per ID recommendations. Review of Systems: As mentioned in the history of present illness. All other systems reviewed and are negative. 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   Past Surgical History:  Procedure Laterality Date   BREAST BIOPSY Right 05/20/2019   Affirm Bx- X- clip, neg   BREAST BIOPSY Left 10/02/2022   [place] Bx, path pending   BREAST BIOPSY Left 10/02/2022   [place] Bx Node- path pending   BREAST BIOPSY Left 10/02/2022   [place] LT BREAST BX W LOC DEV 1ST LESION IMG BX SPEC US GUIDE 10/02/2022 [facility]   CESAREAN SECTION     LAPAROSCOPIC BILATERAL SALPINGECTOMY Bilateral 02/09/2022   Procedure: LAPAROSCOPIC BILATERAL SALPINGECTOMY;  Surgeon: Hildred Laser, MD;  Location: [facility];  Service: Gynecology;  Laterality: Bilateral;   PORTACATH PLACEMENT Right 10/17/2022   Procedure: INSERTION PORT-A-CATH;  Surgeon: Henrene Dodge, MD;  Location: [facility];  Service: General;  Laterality: Right;   TUBAL LIGATION     Social History:  reports that she has never smoked. She has never been exposed to tobacco smoke. She has never used smokeless tobacco. She reports current alcohol use. She reports that she does not use drugs.  No Known Allergies  Family History  Problem Relation Age of Onset   Breast cancer Mother 88       no GT   Breast cancer Maternal Aunt 33   Breast cancer Maternal Aunt    Lung cancer Maternal Uncle    Lung cancer Maternal Uncle    Bone cancer Paternal Aunt    Pancreatic cancer Maternal Grandmother 21   Lung cancer Maternal Grandfather 14   Breast cancer Cousin 40   Breast cancer Other  33       gene pos?    Prior to Admission medications   Medication Sig Start Date End Date Taking? Authorizing Provider  acetaminophen (TYLENOL) 500 MG tablet Take 2 tablets (1,000 mg total) by mouth every 6 (six) hours as needed for mild pain. 10/17/22   Henrene Dodge, MD  amoxicillin-clavulanate (AUGMENTIN) 875-125 MG tablet Take 1 tablet by mouth 2 (two) times daily. 12/30/22   [name], MD  apixaban (ELIQUIS) 5 MG TABS tablet Take 1 tablet (5 mg total) by mouth 2 (two)  times daily. Start AFTER completing your starter pack 12/17/22   [name], MD  Calcium Carbonate-Vitamin D (CALCIUM 600 +D HIGH POTENCY) 600-10 MG-MCG TABS Take 1 tablet by mouth 2 (two) times daily. 11/15/22   [name], MD  lidocaine-prilocaine (EMLA) cream Apply 1 Application topically as needed. 10/21/22   [name], MD  LORazepam (ATIVAN) 0.5 MG tablet Take 1 tablet (0.5 mg total) by mouth every 8 (eight) hours as needed for anxiety (nausea or vomiting). 10/25/22   [name], MD  ondansetron (ZOFRAN) 8 MG tablet Take 1 tablet (8 mg total) by mouth See admin instructions. If needed, start on Day 3 of chemotherapy, take 1 tablet every 8 hours as needed for nausea or vomiting. 10/21/22   [name], MD  potassium chloride SA (KLOR-CON M) 20 MEQ tablet Take 1 tablet (20 mEq total) by mouth daily. 12/06/22   [name], MD  prochlorperazine (COMPAZINE) 10 MG tablet Take 1 tablet (10 mg total) by mouth every 6 (six) hours as needed for nausea or vomiting. 10/21/22   [name], MD  senna (SENOKOT) 8.6 MG TABS tablet Take 2 tablets (17.2 mg total) by mouth daily. 11/15/22   [name], MD    Physical Exam: Vitals:   12/31/22 1046 12/31/22 1048 12/31/22 1200  BP:  122/63 133/78  Pulse: (!) 135  (!) 127  Resp: 20  (!) 23  Temp: 99.3 F (37.4 C)    TempSrc: Oral    SpO2: 98%  100%  Weight:  64.3 kg   Height:  5' (1.524 m)    Physical Exam Constitutional:      Appearance: She is normal weight.     Comments: Minimal to mild distress    HENT:     Head: Normocephalic and atraumatic.     Nose: Nose normal.     Mouth/Throat:     Mouth: Mucous membranes are dry.  Eyes:     Pupils: Pupils are equal, round, and reactive to light.  Cardiovascular:     Rate and Rhythm: Regular rhythm. Tachycardia present.  Pulmonary:     Effort: Pulmonary effort is normal.  Abdominal:     General: Bowel sounds are normal.  Musculoskeletal:        General: Normal range of motion.  Skin:    General: Skin is warm.     Comments:  See right upper chest picture    Neurological:     General: No focal deficit present.  Psychiatric:        Mood and Affect: Mood normal.     Data Reviewed:  There are no new results to review at this time.  Assessment and Plan: * Bacteremia Noted evaluation 11/18 w/ oncology for febrile illness  Blood cultures growing Staph aureus  Case discussed w/ Dr. Renold Don  Recommendation for IV cefazolin pending blood cultures Noted R upper chest port w/ recurrent serous drainage w/ concern for secondary infection- suspected source  Pending general surgery eval for likely port removal    Acute pulmonary embolism (HCC) Formally diagnosed on 10/14  Currently on eliquis  No reported missed doses  Will repeat imaging in setting of sepsis, cough, tachycardia and active malignancy  Hold eliquis for now pending surgery evaluation       Sepsis [facility]) Meeting borderline sepsis criteria on initial presentation w/ tmax 99.3, HR 130s in setting of recent evaluation for febrile illness w/ oncology Baseline breast cancer on  chemotherapy w/ secondary immunosuppression  Blood cultures + for staph aureus  Lactate and WBC are pending  Will tentatively place on sepsis protocol given active immunocompromise assd w/ chemotherapy and bacteremia  Noted RUE port with mild swelling and serous drainage- some concern for infection  + cough with nondiagnostic CXR 11/18  Will CT chest to better assess  IV cefazolin for infectious coverage per ID recommendations  Repeat cultures drawn in ER  LR IVF  Follow closely  Port-A-Cath in place Some concern for R upper chest port infection in setting of sepsis and bacteremia  Pending general surgery evaluation  Continue sepsis and bacteremia treatment protocol  Follow up general surgery recommendations    Invasive carcinoma of breast (HCC) Followed by Dr. Cathie Hoops outpt  Currently on carbo/taxol/Keytruda  Follow up oncology recommendations      Greater than 50% was  spent in counseling, critical care evaluation,  and coordination of care with patient Total encounter time 80 minutes or more    Advance Care Planning:   Code Status: Full Code   Consults: General surgery, infectious disease, Oncology   Family Communication: Family at the bedside   Severity of Illness: The appropriate patient status for this patient is INPATIENT. Inpatient status is judged to be reasonable and necessary in order to provide the required intensity of service to ensure the patient's safety. The patient's presenting symptoms, physical exam findings, and initial radiographic and laboratory data in the context of their chronic comorbidities is felt to place them at high risk for further clinical deterioration. Furthermore, it is not anticipated that the patient will be medically stable for discharge from the hospital within 2 midnights of admission.   * I certify that at the point of admission it is my clinical judgment that the patient will require inpatient hospital care spanning beyond 2 midnights from the point of admission due to high intensity of service, high risk for further deterioration and high frequency of surveillance required.*  Author: Floydene Flock, MD 12/31/2022 12:19 PM  For on call review [email or web address].

## 2022-12-31 NOTE — Assessment & Plan Note (Addendum)
Meeting borderline sepsis criteria on initial presentation w/ tmax 99.3, HR 130s in setting of recent evaluation for febrile illness w/ oncology Baseline breast cancer on  chemotherapy w/ secondary immunosuppression  Blood cultures + for staph aureus  Lactate and WBC are pending  Will tentatively place on sepsis protocol given active immunocompromise assd w/ chemotherapy and bacteremia  Noted RUE port with mild swelling and serous drainage- some concern for infection  + cough with nondiagnostic CXR 11/18  Will CT chest to better assess  IV cefazolin for infectious coverage per ID recommendations  Repeat cultures drawn in ER  LR IVF  Follow closely

## 2022-12-31 NOTE — Assessment & Plan Note (Addendum)
Formally diagnosed on 10/14  Currently on eliquis  No reported missed doses  Will repeat imaging in setting of sepsis, cough, tachycardia and active malignancy  Hold eliquis for now pending surgery evaluation

## 2022-12-31 NOTE — Progress Notes (Signed)
Asked by [facility] to evaluate patient for PCCM consultation. Patient with PMHx of breast cancer undergoing chemotherapy presented from cancer center with main complaint of fever & positive blood cultures for MSSA bacteremia.   Upon arrival to ED patient met Sepsis criteria. Port-a-cath that had been placed 3 weeks prior had had a dehiscence with drainage, last chemo therapy 2 weeks ago. Labs significant for mild hyponatremia, hypokalemia and Transaminitis, leukocytosis without lactic acidosis and an elevated PCT. Oncology, general surgery, Infectious Disease and cardiology all consulted in care.  Upon bedside assessment, patient is A&O- non-toxic appearing- without dyspnea on room air. Vitals are stable, not on vasopressor support.  Continue antibiotic coverage per ID recommendations, appropriate work up in place, no additional recommendations at this time.  If the patient clinical status deteriorates, please re-consult if needed in the future.       Cheryll Cockayne Rust-Chester, AGACNP-BC Acute Care Nurse Practitioner [facility]    [phone] / [phone] Please see Amion for pager details.

## 2022-12-31 NOTE — ED Notes (Signed)
This RN attempted to call report but was informed by Clydie Braun that charge for the unit was unavailable and would have to call back for report. At this time, follow up phone call is still pending for handoff report.

## 2022-12-31 NOTE — ED Triage Notes (Signed)
Pt to ED via POV from cancer center for positive blood cultures. Pt advised that she needed to come in for admission. Pt reports that she does have fever. Pt is currently in NAD. Last chemo 2 weeks ago.

## 2023-01-01 ENCOUNTER — Other Ambulatory Visit: Payer: Self-pay

## 2023-01-01 DIAGNOSIS — D696 Thrombocytopenia, unspecified: Secondary | ICD-10-CM | POA: Diagnosis not present

## 2023-01-01 DIAGNOSIS — I2699 Other pulmonary embolism without acute cor pulmonale: Secondary | ICD-10-CM | POA: Diagnosis not present

## 2023-01-01 DIAGNOSIS — D649 Anemia, unspecified: Secondary | ICD-10-CM

## 2023-01-01 DIAGNOSIS — R7881 Bacteremia: Secondary | ICD-10-CM | POA: Diagnosis not present

## 2023-01-01 DIAGNOSIS — C50919 Malignant neoplasm of unspecified site of unspecified female breast: Secondary | ICD-10-CM | POA: Diagnosis not present

## 2023-01-01 LAB — COMPREHENSIVE METABOLIC PANEL
ALT: 57 U/L — ABNORMAL HIGH (ref 0–44)
AST: 53 U/L — ABNORMAL HIGH (ref 15–41)
Albumin: 3.2 g/dL — ABNORMAL LOW (ref 3.5–5.0)
Alkaline Phosphatase: 74 U/L (ref 38–126)
Anion gap: 4 — ABNORMAL LOW (ref 5–15)
BUN: 9 mg/dL (ref 6–20)
CO2: 22 mmol/L (ref 22–32)
Calcium: 8 mg/dL — ABNORMAL LOW (ref 8.9–10.3)
Chloride: 103 mmol/L (ref 98–111)
Creatinine, Ser: 0.71 mg/dL (ref 0.44–1.00)
GFR, Estimated: >60 mL/min (ref >60)
Glucose, Bld: 174 mg/dL — ABNORMAL HIGH (ref 70–99)
Potassium: 3.7 mmol/L (ref 3.5–5.1)
Sodium: 129 mmol/L — ABNORMAL LOW (ref 135–145)
Total Bilirubin: 0.7 mg/dL (ref <1.2)
Total Protein: 6.6 g/dL (ref 6.5–8.1)

## 2023-01-01 LAB — BLOOD CULTURE ID PANEL (REFLEXED) - BCID2

## 2023-01-01 LAB — CBC
HCT: 29.7 % — ABNORMAL LOW (ref 36.0–46.0)
Hemoglobin: 10 g/dL — ABNORMAL LOW (ref 12.0–15.0)
MCH: 29.1 pg (ref 26.0–34.0)
MCHC: 33.7 g/dL (ref 30.0–36.0)
MCV: 86.3 fL (ref 80.0–100.0)
Platelets: 71 10*3/uL — ABNORMAL LOW (ref 150–400)
RBC: 3.44 MIL/uL — ABNORMAL LOW (ref 3.87–5.11)
WBC: 9 10*3/uL (ref 4.0–10.5)
nRBC: 0 % (ref 0.0–0.2)

## 2023-01-01 LAB — CBC WITH DIFFERENTIAL/PLATELET
Abs Immature Granulocytes: 0.07 10*3/uL (ref 0.00–0.07)
Basophils Absolute: 0 10*3/uL (ref 0.0–0.1)
Basophils Relative: 0 %
Eosinophils Absolute: 0 10*3/uL (ref 0.0–0.5)
Eosinophils Relative: 0 %
HCT: 30.4 % — ABNORMAL LOW (ref 36.0–46.0)
Hemoglobin: 10 g/dL — ABNORMAL LOW (ref 12.0–15.0)
Immature Granulocytes: 1 %
Lymphocytes Relative: 10 %
Lymphs Abs: 0.9 10*3/uL (ref 0.7–4.0)
MCH: 28.8 pg (ref 26.0–34.0)
MCHC: 32.9 g/dL (ref 30.0–36.0)
MCV: 87.6 fL (ref 80.0–100.0)
Monocytes Absolute: 0.4 10*3/uL (ref 0.1–1.0)
Monocytes Relative: 4 %
Neutro Abs: 7.8 10*3/uL — ABNORMAL HIGH (ref 1.7–7.7)
Neutrophils Relative %: 85 %
Platelets: 72 10*3/uL — ABNORMAL LOW (ref 150–400)
RBC: 3.47 MIL/uL — ABNORMAL LOW (ref 3.87–5.11)
RDW: 27.8 % — ABNORMAL HIGH (ref 11.5–15.5)
WBC: 9.1 10*3/uL (ref 4.0–10.5)
nRBC: 0 % (ref 0.0–0.2)

## 2023-01-01 LAB — TECHNOLOGIST SMEAR REVIEW: Plt Morphology: DECREASED

## 2023-01-01 LAB — ECHOCARDIOGRAM LIMITED
Area-P 1/2: 5.66 cm2
Height: 60 in
S' Lateral: 2.2 cm
Weight: 2268.09 [oz_av]

## 2023-01-01 LAB — IMMATURE PLATELET FRACTION: Immature Platelet Fraction: 2.1 % (ref 1.2–8.6)

## 2023-01-01 MED ORDER — BUTALBITAL-APAP-CAFFEINE 50-325-40 MG PO TABS
2.0000 | ORAL_TABLET | Freq: Once | ORAL | Status: AC
  Administered 2023-01-01: 2 via ORAL
  Filled 2023-01-01: qty 2

## 2023-01-01 MED ORDER — BENZONATATE 100 MG PO CAPS
100.0000 mg | ORAL_CAPSULE | Freq: Once | ORAL | Status: AC
  Administered 2023-01-01: 100 mg via ORAL
  Filled 2023-01-01: qty 1

## 2023-01-01 MED ORDER — ORAL CARE MOUTH RINSE: 15.0000 mL | OROMUCOSAL | Status: DC | PRN

## 2023-01-01 MED ORDER — CHLORHEXIDINE GLUCONATE CLOTH 2 % EX PADS: 6.0000 | MEDICATED_PAD | Freq: Every day | CUTANEOUS | Status: DC

## 2023-01-01 NOTE — Progress Notes (Signed)
Hematology/Oncology Progress note [phone] [phone]     Patient Care Team: Shane Crutch, [place] as PCP - General (Family Medicine) Hulen Luster, RN as Oncology Nurse Navigator [name], MD as Consulting Physician (Oncology)   Name of the patient: Penny Gomez  [id number]  [birth year]  Date of visit: 01/01/23   INTERVAL HISTORY-   Patient reports feeling better today.  Febrile this morning still has some headache Mother at the bedside.  No Known Allergies  Patient Active Problem List   Diagnosis Date Noted   Invasive carcinoma of breast (HCC) 10/10/2022    Priority: High   Acute febrile illness 12/30/2022    Priority: Medium    Hypokalemia 12/06/2022    Priority: Medium    Acute pulmonary embolism (HCC) 11/24/2022    Priority: Medium    BRCA1 gene mutation positive 11/11/2022    Priority: Medium    Encounter for antineoplastic chemotherapy 10/25/2022    Priority: Medium    IDA (iron deficiency anemia) 10/10/2022    Priority: Medium    Port-A-Cath in place 10/25/2022    Priority: Low   Goals of care, counseling/discussion 10/10/2022    Priority: Low   Family history of cancer 10/10/2022    Priority: Low   Bacteremia 12/31/2022   Sepsis (HCC) 12/31/2022   Vascular port complication 11/24/2022   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) 11/24/2022   Multifocal pneumonia 11/24/2022   Pulmonary infarct (HCC) 11/24/2022   Genetic testing 11/11/2022   Urinary frequency 03/19/2022     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     Past Surgical History:  Procedure Laterality Date   BREAST BIOPSY Right 05/20/2019   Affirm Bx- X- clip, neg   BREAST BIOPSY Left 10/02/2022   [place] Bx, path pending   BREAST BIOPSY Left 10/02/2022   [place] Bx Node- path pending   BREAST BIOPSY Left  10/02/2022   [place] LT BREAST BX W LOC DEV 1ST LESION IMG BX SPEC US GUIDE 10/02/2022 [facility]   CESAREAN SECTION     LAPAROSCOPIC BILATERAL SALPINGECTOMY Bilateral 02/09/2022   Procedure: LAPAROSCOPIC BILATERAL SALPINGECTOMY;  Surgeon: Hildred Laser, MD;  Location: [facility];  Service: Gynecology;  Laterality: Bilateral;   PORTACATH PLACEMENT Right 10/17/2022   Procedure: INSERTION PORT-A-CATH;  Surgeon: Henrene Dodge, MD;  Location: [facility];  Service: General;  Laterality: Right;   TUBAL LIGATION      Social History   Socioeconomic History   Marital status: Single    Spouse name: Not on file   Number of children: 2   Years of education: Not on file   Highest education level: High school graduate  Occupational History   Not on file  Tobacco Use   Smoking status: Never    Passive exposure: Never   Smokeless tobacco: Never  Vaping Use   Vaping status: Never Used  Substance and Sexual Activity   Alcohol use: Yes    Comment: occ   Drug use: No   Sexual activity: Yes    Birth control/protection: Surgical  Other Topics Concern   Not on file  Social History Narrative   Not on file   Social Determinants of Health   Financial Resource Strain: Low Risk  (10/10/2022)   Overall Financial Resource Strain (CARDIA)  Difficulty of Paying Living Expenses: Not very hard  Food Insecurity: No Food Insecurity (12/31/2022)   Hunger Vital Sign    Worried About Running Out of Food in the Last Year: Never true    Ran Out of Food in the Last Year: Never true  Transportation Needs: No Transportation Needs (12/31/2022)   PRAPARE - Administrator, Civil Service (Medical): No    Lack of Transportation (Non-Medical): No  Physical Activity: Not on file  Stress: No Stress Concern Present (10/10/2022)   [employer] of Occupational Health - Occupational Stress Questionnaire    Feeling of Stress : Only a little  Social Connections: Not on file  Intimate Partner Violence: Not At  Risk (12/31/2022)   Humiliation, Afraid, Rape, and Kick questionnaire    Fear of Current or Ex-Partner: No    Emotionally Abused: No    Physically Abused: No    Sexually Abused: No     Family History  Problem Relation Age of Onset   Breast cancer Mother 22       no GT   Breast cancer Maternal Aunt 33   Breast cancer Maternal Aunt    Lung cancer Maternal Uncle    Lung cancer Maternal Uncle    Bone cancer Paternal Aunt    Pancreatic cancer Maternal Grandmother 57   Lung cancer Maternal Grandfather 30   Breast cancer Cousin 40   Breast cancer Other 33       gene pos?     Current Facility-Administered Medications:    acetaminophen (TYLENOL) tablet 650 mg, 650 mg, Oral, Q6H PRN, Floydene Flock, MD, 650 mg at 01/01/23 0408   apixaban (ELIQUIS) tablet 5 mg, 5 mg, Oral, BID, [name], MD, 5 mg at 01/01/23 0954   ceFAZolin (ANCEF) IVPB 2g/100 mL premix, 2 g, Intravenous, Q8H, Vu, Trung T, MD, Stopped at 01/01/23 3818   Chlorhexidine Gluconate Cloth 2 % PADS 6 each, 6 each, Topical, QHS, Floydene Flock, MD   ondansetron (ZOFRAN) tablet 4 mg, 4 mg, Oral, Q6H PRN **OR** ondansetron (ZOFRAN) injection 4 mg, 4 mg, Intravenous, Q6H PRN, Floydene Flock, MD, 4 mg at 12/31/22 2012   pantoprazole (PROTONIX) injection 40 mg, 40 mg, Intravenous, Q12H, Floydene Flock, MD, 40 mg at 01/01/23 0954   Physical exam:  Vitals:   01/01/23 0755 01/01/23 0915 01/01/23 1005 01/01/23 1100  BP: 107/72 100/67 117/80 105/67  Pulse: 95 (!) 102 (!) 117 (!) 102  Resp: 20 19 20  (!) 24  Temp: 98.5 F (36.9 C)     TempSrc: Oral     SpO2: 100% 96% 100% 97%  Weight:      Height:       Physical Exam Constitutional:      General: She is not in acute distress. HENT:     Head: Normocephalic and atraumatic.  Eyes:     General: No scleral icterus. Cardiovascular:     Rate and Rhythm: Tachycardia present.     Heart sounds: No murmur heard. Pulmonary:     Effort: Pulmonary effort is normal. No  respiratory distress.  Abdominal:     General: There is no distension.     Palpations: Abdomen is soft.     Tenderness: There is no abdominal tenderness.  Musculoskeletal:        General: Normal range of motion.     Cervical back: Normal range of motion and neck supple.  Skin:    General: Skin is warm and dry.  Neurological:     Mental Status: She is alert and oriented to person, place, and time. Mental status is at baseline.     Cranial Nerves: No cranial nerve deficit.     Motor: No abnormal muscle tone.  Psychiatric:        Mood and Affect: Mood and affect normal.       Labs    Latest Ref Rng & Units 01/01/2023    3:46 AM 12/31/2022   11:17 AM 12/30/2022    8:28 AM  CBC  WBC 4.0 - 10.5 K/uL 9.0  19.6  10.4   Hemoglobin 12.0 - 15.0 g/dL 29.5  62.1  30.8   Hematocrit 36.0 - 46.0 % 29.7  37.8  36.1   Platelets 150 - 400 K/uL 71  136  184       Latest Ref Rng & Units 01/01/2023    3:46 AM 12/31/2022   11:17 AM 12/30/2022    8:28 AM  CMP  Glucose 70 - 99 mg/dL 657  846  962   BUN 6 - 20 mg/dL 9  9  9    Creatinine 0.44 - 1.00 mg/dL 9.52  8.41  3.24   Sodium 135 - 145 mmol/L 129  131  135   Potassium 3.5 - 5.1 mmol/L 3.7  3.4  3.6   Chloride 98 - 111 mmol/L 103  97  101   CO2 22 - 32 mmol/L 22  24  22    Calcium 8.9 - 10.3 mg/dL 8.0  9.2  8.8   Total Protein 6.5 - 8.1 g/dL 6.6  8.7  7.6   Total Bilirubin <1.2 mg/dL 0.7  1.2  0.3   Alkaline Phos 38 - 126 U/L 74  106  115   AST 15 - 41 U/L 53  60  32   ALT 0 - 44 U/L 57  68  33      RADIOGRAPHIC STUDIES: I have personally reviewed the radiological images as listed and agreed with the findings in the report. CT Angio Chest Pulmonary Embolism (PE) W or WO Contrast  Result Date: 12/31/2022 CLINICAL DATA:  Elevated liver function tests. Fever. Abdominal pain. Positive blood culture. * Tracking Code: BO * breast cancer. EXAM: CT ANGIOGRAPHY CHEST CT ABDOMEN AND PELVIS WITH CONTRAST TECHNIQUE: Multidetector CT imaging of  the chest was performed using the standard protocol during bolus administration of intravenous contrast. Multiplanar CT image reconstructions and MIPs were obtained to evaluate the vascular anatomy. Multidetector CT imaging of the abdomen and pelvis was performed using the standard protocol during bolus administration of intravenous contrast. RADIATION DOSE REDUCTION: This exam was performed according to the departmental dose-optimization program which includes automated exposure control, adjustment of the mA and/or kV according to patient size and/or use of iterative reconstruction technique. CONTRAST:  75mL OMNIPAQUE IOHEXOL 350 MG/ML SOLN COMPARISON:  03/20/2020 abdominopelvic CT. FINDINGS: CTA CHEST FINDINGS Cardiovascular: The quality of this exam for evaluation of pulmonary embolism is poor to moderate. The bolus is suboptimally timed, centered in the SVC. In addition, there is minimal motion. No central or large lobar pulmonary embolism. Possible small volume pulmonary emboli to the right lower lobe, including within segmental branches on 140/5. Right Port-A-Cath tip high right atrium. subtle soft tissue fullness at the Port-A-Cath insertion site in the neck including on 08/04 is likely related to recent port placement. Normal aortic caliber. Mild cardiomegaly, without pericardial effusion. Mediastinum/Nodes: No axillary or subpectoral adenopathy. No mediastinal or hilar adenopathy. Distal esophageal wall thickening  is mild-to-moderate on 81/4. No internal mammary adenopathy. Lungs/Pleura: No pleural fluid. Minimal motion degradation in the lower chest. Dependent right lower lobe airspace and ground-glass opacity. 2 mm subpleural right upper lobe pulmonary nodule on 34/6, nonspecific. Right middle lobe pulmonary nodules x2 including up to maximally 3 mm on 71/6 were present on 03/20/2020 and considered benign. Musculoskeletal: No acute osseous abnormality. Review of the MIP images confirms the above findings.  CT ABDOMEN and PELVIS FINDINGS Hepatobiliary: Mild limitations secondary to patient arm position, EKG wires and lead artifacts. Hepatomegaly at 21.4 cm. 6 mm subcapsular right hepatic lobe lesion is not readily apparent on the prior. A central left hepatic lobe 4 mm hypoattenuating lesion is present on the prior and likely a cyst. Normal gallbladder, without biliary ductal dilatation. Pancreas: Normal, without mass or ductal dilatation. Spleen: Normal in size, without focal abnormality. Adrenals/Urinary Tract: Normal adrenal glands. Minimal bilateral caliectasis without significant hydroureter. The bladder is mildly distended, likely causative. Appearance is relatively similar to 03/20/2020. Stomach/Bowel: Normal stomach, without wall thickening. Normal large and small bowel loops. Vascular/Lymphatic: Normal caliber of the aorta and branch vessels. No abdominopelvic adenopathy. Reproductive: Fibroid uterus again identified. Uterine masses up to 3.4 cm. Tubular right adnexal cystic structure is again favored to represent hydrosalpinx. Relatively similar at up to 2.5 cm on 79/11. Other: Trace free pelvic fluid is likely physiologic. No abdominal ascites or free intraperitoneal air. Musculoskeletal: No acute osseous abnormality. Review of the MIP images confirms the above findings. IMPRESSION: CT CHEST IMPRESSION 1. Poor to moderate quality evaluation for pulmonary embolism. Possible small right lower lobe segmental to subsegmental pulmonary emboli. Of questionable clinical significance, given volume. 2. Right lower lobe airspace and ground-glass opacity, favoring pneumonia. If the patient does not have infectious symptoms, infarct is a secondary consideration. 3. No evidence of metastatic disease in the chest 4. Distal esophageal wall thickening, suspicious for esophagitis. CT ABDOMEN AND PELVIS IMPRESSION 1. Multifactorial degraded evaluation of the abdomen and pelvis. 2. 6 mm right hepatic lobe lesion is too small  to characterize, felt to be new since the prior exam. Favored to represent a cyst but technically indeterminate. Potential clinical strategies include follow-up on routine screening CTs versus further evaluation with nonemergent outpatient pre and post-contrast MRI. 3. Otherwise, no evidence of metastatic disease in the abdomen or pelvis. 4. Minimal bilateral caliectasis, similar and likely related to bladder distention. 5. Fibroid uterus 6. Right-sided hydrosalpinx, similar. Electronically Signed   By: Jeronimo Greaves M.D.   On: 12/31/2022 15:07   CT ABDOMEN PELVIS W CONTRAST  Result Date: 12/31/2022 CLINICAL DATA:  Elevated liver function tests. Fever. Abdominal pain. Positive blood culture. * Tracking Code: BO * breast cancer. EXAM: CT ANGIOGRAPHY CHEST CT ABDOMEN AND PELVIS WITH CONTRAST TECHNIQUE: Multidetector CT imaging of the chest was performed using the standard protocol during bolus administration of intravenous contrast. Multiplanar CT image reconstructions and MIPs were obtained to evaluate the vascular anatomy. Multidetector CT imaging of the abdomen and pelvis was performed using the standard protocol during bolus administration of intravenous contrast. RADIATION DOSE REDUCTION: This exam was performed according to the departmental dose-optimization program which includes automated exposure control, adjustment of the mA and/or kV according to patient size and/or use of iterative reconstruction technique. CONTRAST:  75mL OMNIPAQUE IOHEXOL 350 MG/ML SOLN COMPARISON:  03/20/2020 abdominopelvic CT. FINDINGS: CTA CHEST FINDINGS Cardiovascular: The quality of this exam for evaluation of pulmonary embolism is poor to moderate. The bolus is suboptimally timed, centered in the SVC. In  addition, there is minimal motion. No central or large lobar pulmonary embolism. Possible small volume pulmonary emboli to the right lower lobe, including within segmental branches on 140/5. Right Port-A-Cath tip high right  atrium. subtle soft tissue fullness at the Port-A-Cath insertion site in the neck including on 08/04 is likely related to recent port placement. Normal aortic caliber. Mild cardiomegaly, without pericardial effusion. Mediastinum/Nodes: No axillary or subpectoral adenopathy. No mediastinal or hilar adenopathy. Distal esophageal wall thickening is mild-to-moderate on 81/4. No internal mammary adenopathy. Lungs/Pleura: No pleural fluid. Minimal motion degradation in the lower chest. Dependent right lower lobe airspace and ground-glass opacity. 2 mm subpleural right upper lobe pulmonary nodule on 34/6, nonspecific. Right middle lobe pulmonary nodules x2 including up to maximally 3 mm on 71/6 were present on 03/20/2020 and considered benign. Musculoskeletal: No acute osseous abnormality. Review of the MIP images confirms the above findings. CT ABDOMEN and PELVIS FINDINGS Hepatobiliary: Mild limitations secondary to patient arm position, EKG wires and lead artifacts. Hepatomegaly at 21.4 cm. 6 mm subcapsular right hepatic lobe lesion is not readily apparent on the prior. A central left hepatic lobe 4 mm hypoattenuating lesion is present on the prior and likely a cyst. Normal gallbladder, without biliary ductal dilatation. Pancreas: Normal, without mass or ductal dilatation. Spleen: Normal in size, without focal abnormality. Adrenals/Urinary Tract: Normal adrenal glands. Minimal bilateral caliectasis without significant hydroureter. The bladder is mildly distended, likely causative. Appearance is relatively similar to 03/20/2020. Stomach/Bowel: Normal stomach, without wall thickening. Normal large and small bowel loops. Vascular/Lymphatic: Normal caliber of the aorta and branch vessels. No abdominopelvic adenopathy. Reproductive: Fibroid uterus again identified. Uterine masses up to 3.4 cm. Tubular right adnexal cystic structure is again favored to represent hydrosalpinx. Relatively similar at up to 2.5 cm on 79/11. Other:  Trace free pelvic fluid is likely physiologic. No abdominal ascites or free intraperitoneal air. Musculoskeletal: No acute osseous abnormality. Review of the MIP images confirms the above findings. IMPRESSION: CT CHEST IMPRESSION 1. Poor to moderate quality evaluation for pulmonary embolism. Possible small right lower lobe segmental to subsegmental pulmonary emboli. Of questionable clinical significance, given volume. 2. Right lower lobe airspace and ground-glass opacity, favoring pneumonia. If the patient does not have infectious symptoms, infarct is a secondary consideration. 3. No evidence of metastatic disease in the chest 4. Distal esophageal wall thickening, suspicious for esophagitis. CT ABDOMEN AND PELVIS IMPRESSION 1. Multifactorial degraded evaluation of the abdomen and pelvis. 2. 6 mm right hepatic lobe lesion is too small to characterize, felt to be new since the prior exam. Favored to represent a cyst but technically indeterminate. Potential clinical strategies include follow-up on routine screening CTs versus further evaluation with nonemergent outpatient pre and post-contrast MRI. 3. Otherwise, no evidence of metastatic disease in the abdomen or pelvis. 4. Minimal bilateral caliectasis, similar and likely related to bladder distention. 5. Fibroid uterus 6. Right-sided hydrosalpinx, similar. Electronically Signed   By: Jeronimo Greaves M.D.   On: 12/31/2022 15:07    Assessment and plan-   Staph aureus bacteremia, likely secondary to infected Mediport.  Status post port removal. Possible pneumonia ID recommendation was reviewed.  Obtain TTE Cardiology was consulted for further evaluation of TEE On IV cefazolin pending blood culture      # Pulmonary embolism, Small volume pulmonary emboli to the right lower lobe. Patient reports being compliant with Eliquis 5 mg twice daily this may be residual PE Continue anticoagulation with Eliquis 5 mg twice daily.   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    # Thrombocytopenia, check immature platelet fraction, tech smear Possibly due to acute illness, recent chemotherapy, or drug induced.  She is on anticoagulation. Monitor closely.   # Anemia, likely due to acute illness and recent chemotherapy.  Monitor.   Thank you for allowing me to participate in the care of this patient.   [name], MD, PhD Hematology Oncology 01/01/2023

## 2023-01-01 NOTE — Progress Notes (Signed)
PHARMACY - PHYSICIAN COMMUNICATION CRITICAL VALUE ALERT - BLOOD CULTURE IDENTIFICATION (BCID)  Penny Gomez is an 37 y.o. female who presented to [facility] on 12/31/2022 with a chief complaint of bacteremia/sepsis in immunocompromised (chemo tx) pt.   Assessment:  Staph aureus in 1 of 4 bottles, no resistance detected.  (include suspected source if known)  Name of physician (or Provider) Contacted:  Larkin Ina, NP   Current antibiotics:  cefazolin 2 gm IV Q8H   Changes to prescribed antibiotics recommended:  Pt is on recommended abx, no changes needed.   Results for orders placed or performed in visit on 12/30/22  Blood Culture ID Panel (Reflexed) (Collected: 12/30/2022 10:04 AM)  Result Value Ref Range   Enterococcus faecalis NOT DETECTED NOT DETECTED   Enterococcus Faecium NOT DETECTED NOT DETECTED   Listeria monocytogenes NOT DETECTED NOT DETECTED   Staphylococcus species DETECTED (A) NOT DETECTED   Staphylococcus aureus (BCID) DETECTED (A) NOT DETECTED   Staphylococcus epidermidis NOT DETECTED NOT DETECTED   Staphylococcus lugdunensis NOT DETECTED NOT DETECTED   Streptococcus species NOT DETECTED NOT DETECTED   Streptococcus agalactiae NOT DETECTED NOT DETECTED   Streptococcus pneumoniae NOT DETECTED NOT DETECTED   Streptococcus pyogenes NOT DETECTED NOT DETECTED   A.calcoaceticus-baumannii PENDING NOT DETECTED   Bacteroides fragilis PENDING NOT DETECTED   Enterobacterales PENDING NOT DETECTED   Enterobacter cloacae complex PENDING NOT DETECTED   Escherichia coli PENDING NOT DETECTED   Klebsiella aerogenes PENDING NOT DETECTED   Klebsiella oxytoca PENDING NOT DETECTED   Klebsiella pneumoniae PENDING NOT DETECTED   Proteus species PENDING NOT DETECTED   Salmonella species PENDING NOT DETECTED   Serratia marcescens PENDING NOT DETECTED   Haemophilus influenzae PENDING NOT DETECTED   Neisseria meningitidis PENDING NOT DETECTED   Pseudomonas aeruginosa PENDING NOT  DETECTED   Stenotrophomonas maltophilia PENDING NOT DETECTED   Candida albicans PENDING NOT DETECTED   Candida auris PENDING NOT DETECTED   Candida glabrata PENDING NOT DETECTED   Candida krusei PENDING NOT DETECTED   Candida parapsilosis PENDING NOT DETECTED   Candida tropicalis PENDING NOT DETECTED   Cryptococcus neoformans/gattii PENDING NOT DETECTED   Meth resistant mecA/C and MREJ NOT DETECTED NOT DETECTED    Penny Gomez D 01/01/2023  3:14 AM

## 2023-01-01 NOTE — Progress Notes (Signed)
[facility] SUR[facility]ICAL PROGRESS NOTE  Hospital Day(s): 1.   Interval History:  Patient seen and examined No acute events or new complaints overnight.  Patient reports she is doing okay this morning No pain at port excision site  She continues to be febrile; t-max in last 24 hours is 103.2, most recent 100.21F at 0400 Leukocytosis normalized; 9.0K this AM (from 19.0K) Hgb to 10.0 Renal function normal; sCr - 0.71; UO - 500 ccs + unmeasured No electrolyte derangements   Vital signs in last 24 hours: [min-max] current  Temp:  [99.3 F (37.4 C)-103.2 F (39.6 C)] 100.8 F (38.2 C) (11/20 0445) Pulse Rate:  [114-140] 140 (11/20 0400) Resp:  [18-29] 29 (11/20 0400) BP: (92-149)/(57-108) 117/85 (11/20 0400) SpO2:  [93 %-100 %] 97 % (11/20 0400) Weight:  [64.3 kg-64.7 kg] 64.7 kg (11/19 2108)     Height: 5' (152.4 cm) Weight: 64.7 kg BMI (Calculated): 27.86   Intake/Output last 2 shifts:  11/19 0701 - 11/20 0700 In: 2540.1 [P.O.:620; I.V.:722.6; IV Piggyback:1197.4] Out: 500 [Urine:500]   Physical Exam:  Constitutional: alert, cooperative and no distress  Respiratory: breathing non-labored at rest  Cardiovascular: regular rate and sinus rhythm  Integumentary: Right chest port excision site is healing via secondary intention, serosanguinous drainage, no erythema. Dressing changed  Right Chest Wall (01/01/2023):    Labs:     Latest Ref Rng & Units 01/01/2023    3:46 AM 12/31/2022   11:17 AM 12/30/2022    8:28 AM  CBC  WBC 4.0 - 10.5 K/uL 9.0  19.6  10.4   Hemoglobin 12.0 - 15.0 g/dL 65.7  84.6  96.2   Hematocrit 36.0 - 46.0 % 29.7  37.8  36.1   Platelets 150 - 400 K/uL 71  136  184       Latest Ref Rng & Units 01/01/2023    3:46 AM 12/31/2022   11:17 AM 12/30/2022    8:28 AM  CMP  Glucose 70 - 99 mg/dL 952  841  324   BUN 6 - 20 mg/dL 9  9  9    Creatinine 0.44 - 1.00 mg/dL 4.01  0.27  2.53   Sodium 135 - 145 mmol/L 129  131  135   Potassium 3.5 - 5.1  mmol/L 3.7  3.4  3.6   Chloride 98 - 111 mmol/L 103  97  101   CO2 22 - 32 mmol/L 22  24  22    Calcium 8.9 - 10.3 mg/dL 8.0  9.2  8.8   Total Protein 6.5 - 8.1 g/dL 6.6  8.7  7.6   Total Bilirubin <1.2 mg/dL 0.7  1.2  0.3   Alkaline Phos 38 - 126 U/L 74  106  115   AST 15 - 41 U/L 53  60  32   ALT 0 - 44 U/L 57  68  33      Imaging studies: No new pertinent imaging studies   Assessment/Plan:  37 y.o. female 1 day s/p bedside removal of port-a-catheter secondary to bacteremia   - Wound Care: continue superficial dressings; change daily and as needed until closure. Okay to remove to take short shower  - Continue IV Abx (Ancef); ID on board    - Pain control prn  - Monitor fever curve  - Monitor leukocytosis; normalized  - Further management per primary service    All of the above findings and recommendations were discussed with the patient, and the medical team, and all of  patient's questions were answered to her expressed satisfaction.  -- Lynden Oxford, PA-C [facility] 01/01/2023, 7:43 AM M-F: 7am - 4pm

## 2023-01-01 NOTE — Progress Notes (Signed)
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 She has had a couple bouts of abx that could have suppressed overt sign of infection   Eitherway would tx as port infection/central line associated blood stream infection. I would get tte/tee and agree   She has no other metastatic focus of infection so far based on history; chest ct repeat in progress to look for any pulm nodular/cavitary changes which, if present, could be from central line emboli or right sided endocarditis   ---------- 01/01/23 id assessment Repeat bcx 11/19 (before abx given) growing staph aureus Clinically no sign of bone/joint involvement or other sites of concerned Port removed 11/19 at bedside -- noted purulence per surgery at port pocket site   Tte and tee pending 11/19 chest abd pelv cta with nonspecific ggo rll and residual pe still   Plan: Continue cefazolin Await Tte and tee Repeat bcx for tomorrow Discussed with primary team   Principal Problem:   Bacteremia Active Problems:   Invasive carcinoma of breast (HCC)   Port-A-Cath in place   Acute pulmonary embolism (HCC)   Sepsis (HCC)   No Known Allergies  Scheduled Meds:   apixaban  5 mg Oral BID   Chlorhexidine Gluconate Cloth  6 each Topical QHS   pantoprazole (PROTONIX) IV  40 mg Intravenous Q12H   Continuous Infusions:   ceFAZolin (ANCEF) IV Stopped (01/01/23 1914)   lactated ringers Stopped (12/31/22 2014)   PRN Meds:.acetaminophen, ondansetron **OR** ondansetron (ZOFRAN) IV   SUBJECTIVE: Felt much better over all. No focal pain Right chest port removed 11/19 and site minimal pain No n/v/diarrhea   Review of Systems: ROS All other ROS was negative, except mentioned above     OBJECTIVE: Vitals:   01/01/23 0755 01/01/23 0915 01/01/23 1005 01/01/23 1100  BP: 107/72 100/67 117/80 105/67  Pulse: 95 (!) 102 (!) 117 (!) 102  Resp: 20 19 20  (!) 24  Temp: 98.5 F (36.9 C)     TempSrc: Oral     SpO2: 100% 96% 100% 97%  Weight:      Height:       Body mass index is 27.86 kg/m.  Physical Exam General/constitutional: no distress, pleasant HEENT: Normocephalic, PER, Conj Clear, EOMI, Oropharynx clear CV: rrr no mrg Lungs: clear to auscultation, normal respiratory effort Abd: Soft, Nontender Ext: no edema Neuro: nonfocal MSK: no peripheral joint swelling/tenderness/warmth    Lab Results Lab Results  Component Value Date  WBC 9.0 01/01/2023   HGB 10.0 (L) 01/01/2023   HCT 29.7 (L) 01/01/2023   MCV 86.3 01/01/2023   PLT 71 (L) 01/01/2023    Lab Results  Component Value Date   CREATININE 0.71 01/01/2023   BUN 9 01/01/2023   NA 129 (L) 01/01/2023   K 3.7 01/01/2023   CL 103 01/01/2023   CO2 22 01/01/2023    Lab Results  Component Value Date   ALT 57 (H) 01/01/2023   AST 53 (H) 01/01/2023   ALKPHOS 74 01/01/2023   BILITOT 0.7 01/01/2023      Microbiology: Recent Results (from the past 240 hour(s))  Respiratory (~20 pathogens) panel by PCR     Status: None   Collection Time: 12/30/22  9:40 AM   Specimen: Nasopharyngeal Swab; Respiratory  Result Value Ref Range Status   Adenovirus NOT DETECTED NOT DETECTED Final    Coronavirus 229E NOT DETECTED NOT DETECTED Final    Comment: (NOTE) The Coronavirus on the Respiratory Panel, DOES NOT test for the novel  Coronavirus (2019 nCoV)    Coronavirus HKU1 NOT DETECTED NOT DETECTED Final   Coronavirus NL63 NOT DETECTED NOT DETECTED Final   Coronavirus OC43 NOT DETECTED NOT DETECTED Final   Metapneumovirus NOT DETECTED NOT DETECTED Final   Rhinovirus / Enterovirus NOT DETECTED NOT DETECTED Final   Influenza A NOT DETECTED NOT DETECTED Final   Influenza B NOT DETECTED NOT DETECTED Final   Parainfluenza Virus 1 NOT DETECTED NOT DETECTED Final   Parainfluenza Virus 2 NOT DETECTED NOT DETECTED Final   Parainfluenza Virus 3 NOT DETECTED NOT DETECTED Final   Parainfluenza Virus 4 NOT DETECTED NOT DETECTED Final   Respiratory Syncytial Virus NOT DETECTED NOT DETECTED Final   Bordetella pertussis NOT DETECTED NOT DETECTED Final   Bordetella Parapertussis NOT DETECTED NOT DETECTED Final   Chlamydophila pneumoniae NOT DETECTED NOT DETECTED Final   Mycoplasma pneumoniae NOT DETECTED NOT DETECTED Final    Comment: Performed at [facility], [address], [place], [place] [address]  Urine Culture     Status: Abnormal   Collection Time: 12/30/22  9:42 AM   Specimen: Urine, Clean Catch  Result Value Ref Range Status   Specimen Description   Final    URINE, CLEAN CATCH Performed at [facility], [address], [place], [place] [address]    Special Requests   Final    NONE Performed at [facility], [address] [place], [place] [address]    Culture (A)  Final    <10,000 COLONIES/mL INSIGNIFICANT GROWTH Performed at [facility], [address] [place], [place] [address]    Report Status 12/31/2022 FINAL  Final  Culture, blood (routine x 2)     Status: Abnormal (Preliminary result)   Collection Time: 12/30/22  9:56 AM   Specimen: BLOOD LEFT ARM  Result Value Ref Range Status   Specimen Description   Final    BLOOD LEFT  ARM Performed at [facility], [address], [place], [place] [address]    Special Requests   Final    BOTTLES DRAWN AEROBIC AND ANAEROBIC Blood Culture adequate volume Performed at [facility], [address] [place], [place] [address]    Culture  Setup Time   Final    GRAM POSITIVE COCCI IN BOTH AEROBIC AND ANAEROBIC BOTTLES CRITICAL VALUE NOTED.  VALUE IS CONSISTENT WITH PREVIOUSLY REPORTED AND CALLED VALUE. Performed at [facility], [address]  [place], [place] [address]    Culture STAPHYLOCOCCUS AUREUS (A)  Final   Report Status PENDING  Incomplete  Culture, blood (routine x 2)     Status: Abnormal (Preliminary result)   Collection Time: 12/30/22 10:04 AM   Specimen: BLOOD RIGHT ARM  Result Value Ref Range Status   Specimen Description   Final    BLOOD RIGHT ARM Performed at [facility], [address], [place], [place] [address]    Special Requests   Final    BOTTLES DRAWN AEROBIC AND ANAEROBIC Blood Culture adequate volume Performed at [facility], [address] [place], [place] [address]    Culture  Setup Time   Final    GRAM POSITIVE COCCI IN BOTH AEROBIC AND ANAEROBIC BOTTLES CRITICAL RESULT CALLED TO, READ BACK BY AND VERIFIED WITH: JASON ROBBINS@0555  12/31/22 RH Performed at [facility], [address], [place], [place] [address]    Culture (A)  Final    STAPHYLOCOCCUS AUREUS SUSCEPTIBILITIES TO FOLLOW Performed at [facility], [address], [place], [place] [address]    Report Status PENDING  Incomplete  Blood Culture ID Panel (Reflexed)     Status: Abnormal (Preliminary result)   Collection Time: 12/30/22 10:04 AM  Result Value Ref Range Status   Enterococcus faecalis NOT DETECTED NOT DETECTED Final   Enterococcus Faecium NOT DETECTED NOT DETECTED Final   Listeria monocytogenes NOT DETECTED NOT DETECTED Final   Staphylococcus species DETECTED (A) NOT DETECTED Final    Comment: CRITICAL  RESULT CALLED TO, READ BACK BY AND VERIFIED WITH: JASON ROBBINS@0555  12/31/22 RH    Staphylococcus aureus (BCID) DETECTED (A) NOT DETECTED Final    Comment: CRITICAL RESULT CALLED TO, READ BACK BY AND VERIFIED WITH: JASON ROBBINS@0555  12/31/22 RH    Staphylococcus epidermidis NOT DETECTED NOT DETECTED Final   Staphylococcus lugdunensis NOT DETECTED NOT DETECTED Final   Streptococcus species NOT DETECTED NOT DETECTED Final   Streptococcus agalactiae NOT DETECTED NOT DETECTED Final   Streptococcus pneumoniae NOT DETECTED NOT DETECTED Final   Streptococcus pyogenes NOT DETECTED NOT DETECTED Final   A.calcoaceticus-baumannii PENDING NOT DETECTED Incomplete   Bacteroides fragilis PENDING NOT DETECTED Incomplete   Enterobacterales PENDING NOT DETECTED Incomplete   Enterobacter cloacae complex PENDING NOT DETECTED Incomplete   Escherichia coli PENDING NOT DETECTED Incomplete   Klebsiella aerogenes PENDING NOT DETECTED Incomplete   Klebsiella oxytoca PENDING NOT DETECTED Incomplete   Klebsiella pneumoniae PENDING NOT DETECTED Incomplete   Proteus species PENDING NOT DETECTED Incomplete   Salmonella species PENDING NOT DETECTED Incomplete   Serratia marcescens PENDING NOT DETECTED Incomplete   Haemophilus influenzae PENDING NOT DETECTED Incomplete   Neisseria meningitidis PENDING NOT DETECTED Incomplete   Pseudomonas aeruginosa PENDING NOT DETECTED Incomplete   Stenotrophomonas maltophilia PENDING NOT DETECTED Incomplete   Candida albicans PENDING NOT DETECTED Incomplete   Candida auris PENDING NOT DETECTED Incomplete   Candida glabrata PENDING NOT DETECTED Incomplete   Candida krusei PENDING NOT DETECTED Incomplete   Candida parapsilosis PENDING NOT DETECTED Incomplete   Candida tropicalis PENDING NOT DETECTED Incomplete   Cryptococcus neoformans/gattii PENDING NOT DETECTED Incomplete   Meth resistant mecA/C and MREJ NOT DETECTED NOT DETECTED Final    Comment: Performed at [facility], [address] [place], [place] [address]  Blood Culture (routine x 2)     Status: None (Preliminary result)   Collection Time: 12/31/22 11:00 AM   Specimen: BLOOD  Result Value Ref Range Status   Specimen Description BLOOD  RIGHT ANTECUBITAL  Final   Special Requests   Final    BOTTLES DRAWN AEROBIC AND ANAEROBIC Blood Culture results may not be optimal due to an excessive volume of blood received in culture bottles   Culture  Setup Time   Final    Organism ID to follow GRAM POSITIVE COCCI IN BOTH AEROBIC AND ANAEROBIC BOTTLES CRITICAL RESULT CALLED TO, READ BACK BY AND VERIFIED WITH: JASON ROBBINS PHARMD @0307  01/01/23 ASW Performed at [facility], [address] [place], [place] [address]    Culture GRAM POSITIVE COCCI  Final   Report Status PENDING  Incomplete  Blood Culture ID Panel (Reflexed)     Status: Abnormal   Collection Time: 12/31/22 11:00 AM  Result Value Ref Range Status   Enterococcus faecalis NOT DETECTED NOT DETECTED Final   Enterococcus Faecium NOT DETECTED NOT DETECTED Final   Listeria monocytogenes NOT DETECTED NOT DETECTED Final   Staphylococcus species DETECTED (A) NOT DETECTED Final    Comment: CRITICAL RESULT CALLED TO, READ BACK BY AND VERIFIED WITH: JASON ROBBINS PHARMD @0307  01/01/23 ASW    Staphylococcus aureus (BCID) DETECTED (A) NOT DETECTED Final    Comment: CRITICAL RESULT CALLED TO, READ BACK BY AND VERIFIED WITH: JASON ROBBINS PHARMD @0307  01/01/23 ASW    Staphylococcus epidermidis NOT DETECTED NOT DETECTED Final   Staphylococcus lugdunensis NOT DETECTED NOT DETECTED Final   Streptococcus species NOT DETECTED NOT DETECTED Final   Streptococcus agalactiae NOT DETECTED NOT DETECTED Final   Streptococcus pneumoniae NOT DETECTED NOT DETECTED Final   Streptococcus pyogenes NOT DETECTED NOT DETECTED Final   A.calcoaceticus-baumannii NOT DETECTED NOT DETECTED Final   Bacteroides fragilis NOT DETECTED NOT DETECTED Final    Enterobacterales NOT DETECTED NOT DETECTED Final   Enterobacter cloacae complex NOT DETECTED NOT DETECTED Final   Escherichia coli NOT DETECTED NOT DETECTED Final   Klebsiella aerogenes NOT DETECTED NOT DETECTED Final   Klebsiella oxytoca NOT DETECTED NOT DETECTED Final   Klebsiella pneumoniae NOT DETECTED NOT DETECTED Final   Proteus species NOT DETECTED NOT DETECTED Final   Salmonella species NOT DETECTED NOT DETECTED Final   Serratia marcescens NOT DETECTED NOT DETECTED Final   Haemophilus influenzae NOT DETECTED NOT DETECTED Final   Neisseria meningitidis NOT DETECTED NOT DETECTED Final   Pseudomonas aeruginosa NOT DETECTED NOT DETECTED Final   Stenotrophomonas maltophilia NOT DETECTED NOT DETECTED Final   Candida albicans NOT DETECTED NOT DETECTED Final   Candida auris NOT DETECTED NOT DETECTED Final   Candida glabrata NOT DETECTED NOT DETECTED Final   Candida krusei NOT DETECTED NOT DETECTED Final   Candida parapsilosis NOT DETECTED NOT DETECTED Final   Candida tropicalis NOT DETECTED NOT DETECTED Final   Cryptococcus neoformans/gattii NOT DETECTED NOT DETECTED Final   Meth resistant mecA/C and MREJ NOT DETECTED NOT DETECTED Final    Comment: Performed at [facility], [address] [place], [place] [address]  Blood Culture (routine x 2)     Status: None (Preliminary result)   Collection Time: 12/31/22 11:05 AM   Specimen: BLOOD  Result Value Ref Range Status   Specimen Description BLOOD BLOOD LEFT ARM  Final   Special Requests   Final    BOTTLES DRAWN AEROBIC AND ANAEROBIC Blood Culture adequate volume   Culture  Setup Time   Final    GRAM POSITIVE COCCI IN BOTH AEROBIC AND ANAEROBIC BOTTLES CRITICAL VALUE NOTED.  VALUE IS CONSISTENT WITH PREVIOUSLY REPORTED AND CALLED VALUE. Performed at [facility], [address] [place], [place] [address]    Culture GRAM POSITIVE COCCI  Final   Report Status PENDING  Incomplete  MRSA Next Gen by PCR, Nasal      Status: None   Collection Time: 12/31/22  9:17 PM   Specimen: Nasal Mucosa; Nasal Swab  Result Value Ref Range Status   MRSA by PCR Next Gen NOT DETECTED NOT DETECTED Final    Comment: (NOTE) The GeneXpert MRSA Assay (FDA approved for NASAL specimens only), is one component of a comprehensive MRSA colonization surveillance program. It is not intended to diagnose MRSA infection nor to guide or monitor treatment for MRSA infections. Test performance is not FDA approved in patients less than 53 years old. Performed at [facility], [address] [place], [place] [address]      Serology:   Imaging: If present, new imagings (plain films, ct scans, and mri) have been personally visualized and interpreted; radiology reports have been reviewed. Decision making incorporated into the Impression / Recommendations.  11/19 cta abd pelv chest IMPRESSION: CT CHEST IMPRESSION   1. Poor to moderate quality evaluation for pulmonary embolism. Possible small right lower lobe segmental to subsegmental pulmonary emboli. Of questionable clinical significance, given volume. 2. Right lower lobe airspace and ground-glass opacity, favoring pneumonia. If the patient does not have infectious symptoms, infarct is a secondary consideration. 3. No evidence of metastatic disease in the chest 4. Distal esophageal wall thickening, suspicious for esophagitis.   CT ABDOMEN AND PELVIS IMPRESSION   1. Multifactorial degraded evaluation of the abdomen and pelvis. 2. 6 mm right hepatic lobe lesion is too small to characterize, felt to be new since the prior exam. Favored to represent a cyst but technically indeterminate. Potential clinical strategies include follow-up on routine screening CTs versus further evaluation with nonemergent outpatient pre and post-contrast MRI. 3. Otherwise, no evidence of metastatic disease in the abdomen or pelvis. 4. Minimal bilateral caliectasis, similar and likely  related to bladder distention. 5. Fibroid uterus 6. Right-sided hydrosalpinx, similar.   11/19 tte pending  Raymondo Band, MD [facility] [phone] pager    01/01/2023, 11:47 AM

## 2023-01-01 NOTE — Progress Notes (Signed)
  PROGRESS NOTE    Penny Gomez  HWE:[id number] DOB: 06-Jan-1986 DOA: 12/31/2022 PCP: Shane Crutch, PA  127A/127A-AA  LOS: 1 day   Brief hospital course:   Assessment & Plan: Penny Gomez is a 37 y.o. female with medical history significant of left breast cancer being followed by Dr. Cathie Hoops outpatient, pulmonary embolism on Eliquis, presenting with sepsis, Staph aureus bacteremia, infected port.     * MSSA Bacteremia 2/2  Port infection Noted evaluation 11/18 w/ oncology for febrile illness  Blood cultures growing Staph aureus  Case discussed w/ ID Dr. Renold Don  S/p port removal Plan: --cont IV cefazolin --repeat blood cx tomorrow --TEE tomorrow  Hx of pulmonary embolism (HCC) Formally diagnosed on 10/14  --cont Eliquis  Sepsis (HCC) --fever, tachycardia, bacteremia  Invasive carcinoma of breast (HCC) Followed by Dr. Cathie Hoops outpt  Currently on carbo/taxol/Keytruda  --Onc consulted with Dr. Cathie Hoops  Thrombocytopenia --Possibly due to acute illness, recent chemotherapy, or drug induced.  --check immature platelet fraction, tech smear  --monitor  Anemia --likely due to acute illness and recent chemotherapy.  --monitor   DVT prophylaxis: EL:[id number] Code Status: Full code  Family Communication:  Level of care: Med-Surg Dispo:   The patient is from: home Anticipated d/c is to: home Anticipated d/c date is: > 3 days   Subjective and Interval History:  Still having intermittent fevers.  Pt with no complaint today.   Objective: Vitals:   01/01/23 1230 01/01/23 1400 01/01/23 1525 01/01/23 1732  BP:   103/73 112/72  Pulse:    86  Resp:   (!) 24 16  Temp: (!) 102 F (38.9 C) 99.9 F (37.7 C) 98.9 F (37.2 C) 98.9 F (37.2 C)  TempSrc: Oral Oral Oral   SpO2:   100% 100%  Weight:      Height:        Intake/Output Summary (Last 24 hours) at 01/01/2023 1739 Last data filed at 01/01/2023 1230 Gross per 24 hour  Intake 1780.06 ml  Output --  Net 1780.06 ml   Filed  Weights   12/31/22 1048 12/31/22 2108  Weight: 64.3 kg 64.7 kg    Examination:   Constitutional: NAD, AAOx3 HEENT: conjunctivae and lids normal, EOMI CV: No cyanosis.   RESP: normal respiratory effort, on RA Neuro: II - XII grossly intact.   Psych: Normal mood and affect.  Appropriate judgement and reason   Data Reviewed: I have personally reviewed labs and imaging studies  Time spent: 50 minutes  Darlin Priestly, MD [facility] If 7PM-7AM, please contact night-coverage 01/01/2023, 5:39 PM

## 2023-01-02 ENCOUNTER — Other Ambulatory Visit: Payer: Self-pay

## 2023-01-02 ENCOUNTER — Inpatient Hospital Stay: Payer: Self-pay | Admitting: Anesthesiology

## 2023-01-02 ENCOUNTER — Inpatient Hospital Stay (HOSPITAL_COMMUNITY)
Admit: 2023-01-02 | Discharge: 2023-01-02 | Disposition: A | Discharge status: Home / Self Care | Payer: Medicaid Other | Attending: Nurse Practitioner | Admitting: Nurse Practitioner

## 2023-01-02 ENCOUNTER — Other Ambulatory Visit (HOSPITAL_COMMUNITY): Payer: Self-pay

## 2023-01-02 ENCOUNTER — Encounter
Admission: EM | Disposition: A | Discharge status: Home Health | Payer: Self-pay | Source: Home / Self Care | Attending: Hospitalist

## 2023-01-02 ENCOUNTER — Encounter: Payer: Self-pay | Admitting: Oncology

## 2023-01-02 ENCOUNTER — Encounter: Payer: Self-pay | Admitting: Cardiovascular Disease

## 2023-01-02 ENCOUNTER — Inpatient Hospital Stay: Payer: Medicaid Other

## 2023-01-02 DIAGNOSIS — R7881 Bacteremia: Secondary | ICD-10-CM

## 2023-01-02 DIAGNOSIS — I38 Endocarditis, valve unspecified: Secondary | ICD-10-CM

## 2023-01-02 DIAGNOSIS — T80211A Bloodstream infection due to central venous catheter, initial encounter: Secondary | ICD-10-CM

## 2023-01-02 HISTORY — PX: TEE WITHOUT CARDIOVERSION: SHX5443

## 2023-01-02 LAB — CULTURE, BLOOD (ROUTINE X 2)
Special Requests: ADEQUATE
Special Requests: ADEQUATE

## 2023-01-02 LAB — ECHO TEE

## 2023-01-02 LAB — FOLATE: Folate: 11.1 ng/mL (ref >5.9)

## 2023-01-02 SURGERY — TRANSESOPHAGEAL ECHOCARDIOGRAM (TEE)
Anesthesia: General

## 2023-01-02 MED ORDER — LIDOCAINE VISCOUS HCL 2 % MT SOLN
OROMUCOSAL | Status: AC
  Filled 2023-01-02: qty 15

## 2023-01-02 MED ORDER — MIDAZOLAM HCL 2 MG/2ML IJ SOLN
INTRAMUSCULAR | Status: AC
  Filled 2023-01-02: qty 2

## 2023-01-02 MED ORDER — POLYETHYLENE GLYCOL 3350 17 G PO PACK
17.0000 g | PACK | Freq: Two times a day (BID) | ORAL | Status: DC | PRN
  Administered 2023-01-03 – 2023-01-04 (×3): 17 g via ORAL
  Filled 2023-01-02 (×3): qty 1

## 2023-01-02 MED ORDER — PROPOFOL 10 MG/ML IV BOLUS
INTRAVENOUS | Status: DC | PRN
  Administered 2023-01-02: 20 mg via INTRAVENOUS
  Administered 2023-01-02: 70 mg via INTRAVENOUS
  Administered 2023-01-02 (×4): 20 mg via INTRAVENOUS

## 2023-01-02 MED ORDER — SODIUM CHLORIDE 0.9 % IV SOLN: INTRAVENOUS | Status: DC | PRN

## 2023-01-02 MED ORDER — SENNOSIDES-DOCUSATE SODIUM 8.6-50 MG PO TABS
2.0000 | ORAL_TABLET | Freq: Once | ORAL | Status: AC
  Administered 2023-01-03: 2 via ORAL
  Filled 2023-01-02: qty 2

## 2023-01-02 MED ORDER — METHOCARBAMOL 500 MG PO TABS
500.0000 mg | ORAL_TABLET | Freq: Once | ORAL | Status: AC
  Administered 2023-01-02: 500 mg via ORAL
  Filled 2023-01-02: qty 1

## 2023-01-02 MED ORDER — MORPHINE SULFATE (PF) 2 MG/ML IV SOLN
1.0000 mg | Freq: Once | INTRAVENOUS | Status: AC
  Administered 2023-01-02: 1 mg via INTRAVENOUS
  Filled 2023-01-02: qty 1

## 2023-01-02 MED ORDER — BUTAMBEN-TETRACAINE-BENZOCAINE 2-2-14 % EX AERO
INHALATION_SPRAY | CUTANEOUS | Status: AC
  Filled 2023-01-02: qty 5

## 2023-01-02 MED ORDER — MIDAZOLAM HCL 2 MG/2ML IJ SOLN
INTRAMUSCULAR | Status: DC | PRN
  Administered 2023-01-02: 2 mg via INTRAVENOUS

## 2023-01-02 MED ORDER — PROPOFOL 10 MG/ML IV BOLUS
INTRAVENOUS | Status: AC
Start: 2023-01-02 — End: ?
  Filled 2023-01-02: qty 40

## 2023-01-02 NOTE — Progress Notes (Signed)
Transesophageal Echocardiogram :  Indication: bacteremia, rule out endocarditis Requesting/ordering  physician: Dr. Darlin Priestly  Procedure: Benzocaine spray x2 and 2 mls x 2 of viscous lidocaine were given orally to provide local anesthesia to the oropharynx. The patient was positioned supine on the left side, bite block provided. The patient was moderately sedated with the doses of versed and fentanyl as detailed below.  Using digital technique an omniplane probe was advanced into the distal esophagus without incident.   Moderate sedation: 1. Sedation used:  propofol per anesthesia  See report in EPIC  for complete details: In brief,  No valve vegetation noted transgastric imaging revealed normal LV function with no RWMAs and no mural apical thrombus.  .  Estimated ejection fraction was 55%.  Right sided cardiac chambers were normal with no evidence of pulmonary hypertension.  Imaging of the septum showed no ASD or VSD Bubble study was negative for shunt 2D and color flow confirmed no PFO  The LA was well visualized in orthogonal views.  There was no spontaneous contrast and no thrombus in the LA and LA appendage   The descending thoracic aorta had no  mural aortic debris with no evidence of aneurysmal dilation or disection   Tashala Cumbo 01/02/2023 8:00 AM

## 2023-01-02 NOTE — Transfer of Care (Signed)
Immediate Anesthesia Transfer of Care Note  Patient: Penny Gomez  Procedure(s) Performed: Procedure(s): TRANSESOPHAGEAL ECHOCARDIOGRAM (TEE) (N/A)  Patient Location: PACU and Short Stay  Anesthesia Type:General  Level of Consciousness: awake, alert  and oriented  Airway & Oxygen Therapy: Patient Spontanous Breathing and Patient connected to nasal cannula oxygen  Post-op Assessment: Report given to RN and Post -op Vital signs reviewed and stable  Post vital signs: Reviewed and stable  Last Vitals:  Vitals:   01/02/23 0802 01/02/23 0803  BP:  103/74  Pulse: (!) 106 (!) 102  Resp: (!) 23 (!) 28  Temp:    SpO2: 100% 99%    Complications: No apparent anesthesia complications

## 2023-01-02 NOTE — Plan of Care (Signed)

## 2023-01-02 NOTE — Progress Notes (Signed)
*  PRELIMINARY RESULTS* Echocardiogram Echocardiogram Transesophageal has been performed.  Penny Gomez 01/02/2023, 8:06 AM

## 2023-01-02 NOTE — Progress Notes (Signed)
       CROSS COVER NOTE  NAME: Penny Gomez MRN: [id number] DOB : 10/27/1985    Concern as stated by nurse / staff   Message received from [facility] RN hey re [address], here for bacteremia, she is complaining of 10/10 pain to the right flank pain, post her IV abx, she says it happened earlier today as well but now it feels worse, I gave her some PRN tylenol a few minutes ago and heat pack as requested. she says its " achy muscle pain " that she hasn't had before today.      Pertinent findings on chart review: 89 year ol female with left breast cancer, PE on eliquis who was admitted to the hospital for MSSA bacteremia from infected port that has been removed. Repeat blood cultures collected earlier today  Assessment and  Interventions   Assessment:    01/02/2023    8:52 PM 01/02/2023   11:23 AM 01/02/2023    9:02 AM  Vitals with BMI  Systolic 122 107 284  Diastolic 77 75 75  Pulse  109 132   Temp 99.2 F Tenderness on palpation mid right back  Respirations clear unlabored Chest xray ordered "FINDINGS: Cardiac shadow is within normal limits. The lungs are hypoinflated. No focal infiltrate is seen. Scattered large and small bowel gas is noted. No bony abnormality is seen. Right chest wall port has been removed.   IMPRESSION: No acute abnormality noted." Plan: One time dose of morphine and robaxin ordered - likely musculoskeletal Incentive spirometry and flutter valve therapies ordered        Donnie Mesa NP [facility] Cross Cover 7pm-7am - check amion for availability Pager [phone]

## 2023-01-02 NOTE — Progress Notes (Signed)
  PROGRESS NOTE    Penny Gomez  UJW:[id number] DOB: 01/26/1986 DOA: 12/31/2022 PCP: Shane Crutch, PA  127A/127A-AA  LOS: 2 days   Brief hospital course:   Assessment & Plan: Penny Gomez is a 37 y.o. female with medical history significant of left breast cancer being followed by Dr. Cathie Hoops outpatient, pulmonary embolism on Eliquis, presenting with sepsis, Staph aureus bacteremia, infected port.     * MSSA Bacteremia 2/2  Port infection Noted evaluation 11/18 w/ oncology for febrile illness  Blood cultures growing Staph aureus  Case discussed w/ ID Dr. Renold Don  S/p port removal Plan: --cont IV cefazolin --repeat blood cx today --TEE today, neg for vegetation.   --need blood cx neg 3 days and no fever 2 days before discharge.  Hx of pulmonary embolism (HCC) Formally diagnosed on 10/14  --cont Eliquis  Sepsis (HCC) --fever, tachycardia, bacteremia  Invasive carcinoma of breast (HCC) Followed by Dr. Cathie Hoops outpt  Currently on carbo/taxol/Keytruda  --Onc consulted with Dr. Cathie Hoops  Thrombocytopenia --Possibly due to acute illness, recent chemotherapy, or drug induced.  --check immature platelet fraction, tech smear  --monitor  Anemia --likely due to acute illness and recent chemotherapy.  --iron profile checked 1 month ago, no def --check vit b12 and folate --monitor Hgb   DVT prophylaxis: [id number] Code Status: Full code  Family Communication:  Level of care: Med-Surg Dispo:   The patient is from: home Anticipated d/c is to: home Anticipated d/c date is: 2-3 days   Subjective and Interval History:  Pt reported some back pain today, which was located to the right of spine.   Objective: Vitals:   01/02/23 0835 01/02/23 0902 01/02/23 1123 01/02/23 1322  BP: (!) 104/56 109/75 107/75   Pulse: (!) 101 (!) 101 (!) 109   Resp: (!) 21 18    Temp:  98.5 F (36.9 C) 98.2 F (36.8 C) 98.1 F (36.7 C)  TempSrc:  Oral  Oral  SpO2: 97% 100% 100%   Weight:      Height:         Intake/Output Summary (Last 24 hours) at 01/02/2023 1838 Last data filed at 01/02/2023 1708 Gross per 24 hour  Intake 540.06 ml  Output --  Net 540.06 ml   Filed Weights   12/31/22 1048 12/31/22 2108  Weight: 64.3 kg 64.7 kg    Examination:   Constitutional: NAD, AAOx3 HEENT: conjunctivae and lids normal, EOMI CV: No cyanosis.   RESP: normal respiratory effort, on RA Neuro: II - XII grossly intact.   Psych: Normal mood and affect.  Appropriate judgement and reason   Data Reviewed: I have personally reviewed labs and imaging studies  Time spent: 35 minutes  Darlin Priestly, MD [facility] If 7PM-7AM, please contact night-coverage 01/02/2023, 6:38 PM

## 2023-01-02 NOTE — Discharge Instructions (Addendum)
Right chest wound instructions: --OK to shower, just let the water run through, don't scrub too heavily and pad dry. --Can apply dry gauze dressing or large BandAid dressing over the wound, and change once daily.  May change more often if it's getting saturated.   Antibiotic instructions: While on the antibiotic linezolid avoid or minimize following foods and drinks as they may interact with linezolid.  These foods and drinks contain tyramine.  Tyramine is a natural product found in some plants and animals.  Too much tyramine in combination with linezolid can cause high levels of serotonin in the body.  Serotonin is a chemical in our body that controls mood, sleep, digestion, and other functions.  Several signs of too much serotonin in the body (also called serotonin syndrome) are fast heart rate, sweating, fevers, high blood pressure, muscle twitching, and confusion.  It is important to seek medical attention if you have these symptoms.   Avoid foods and beverages that are high in tyramine while taking linezolid, including: Alcoholic beverages (such as beer, red wine, vermouth, sherry) Aged cheeses (such as cheddar, blue cheese, swiss, feta, parmesan, camembert) Fermented or pickled foods (such as sauerkraut, pickled beets, pickled peppers, pickled cucumbers/pickles, kim chee/kimchi)  Dried/aged, smoked or processed meats and sausages (such as salami, pepperoni, liverwurst, hot dogs, bologna, bacon) Soybean products (such as soy sauce, tofu) Preserved fish (such as pickled herring) Products that contain large amounts of yeast (such as bouillon cubes, powdered soup/gravy, homemade or sourdough bread)  Broad/fava beans  Following foods are OK to eat while taking linezolid: Pasteurized cheeses (such as [place], ricotta, cottage cheese, cream cheese) Vegetables (not fermented or pickled) Non-cured or smoked meats

## 2023-01-02 NOTE — Progress Notes (Signed)
Below is the note written to the on-call provider about pt's RED MEWS...   Pt is a recent transfer down from the ICU, admitted to 1C on 11/20. She was admitted for bacteremia/sepsis. At the start of my shift she was a green MEWS. She has now spiked a fever of 102.8 with a HR of 121. The MEWS protocol for a RED mews is to notify the provider.   "Red: Discuss with charge nurse and notify provider. Consider notifying RRT. If remains red for 2 hours consider need for higher level of care"   She is already on IV Abx. I have given tylenol PRN, and will recheck her vitals at 0455. Is there anything else you would like for me to do?

## 2023-01-02 NOTE — Progress Notes (Signed)
   01/02/23 0455  Vitals  Temp 98.9 F (37.2 C)  Temp Source Oral  BP 102/72  MAP (mmHg) 82  BP Location Right Arm  BP Method Automatic  Patient Position (if appropriate) Lying  Pulse Rate (!) 110  Pulse Rate Source Dinamap  Resp 18  Level of Consciousness  Level of Consciousness Alert  MEWS COLOR  MEWS Score Color Green  Oxygen Therapy  SpO2 98 %  O2 Device Room Air  Patient Activity (if Appropriate) In bed  Pulse Oximetry Type Intermittent  PCA/Epidural/Spinal Assessment  Respiratory Pattern Regular;Unlabored;Symmetrical  MEWS Score  MEWS Temp 0  MEWS Systolic 0  MEWS Pulse 1  MEWS RR 0  MEWS LOC 0  MEWS Score 1   Updated vitals after one hour

## 2023-01-02 NOTE — Progress Notes (Signed)
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 She has had a couple bouts of abx that could have suppressed overt sign of infection   Eitherway would tx as port infection/central line associated blood stream infection. I would get tte/tee and agree   She has no other metastatic focus of infection so far based on history; chest ct repeat in progress to look for any pulm nodular/cavitary changes which, if present, could be from central line emboli or right sided endocarditis   ---------- 01/01/23 id assessment Repeat bcx 11/19 (before abx given) growing staph aureus Clinically no sign of bone/joint involvement or other sites of concerned Port removed 11/19 at bedside -- noted purulence per surgery at port pocket site   Tte and tee pending 11/19 chest abd pelv cta with nonspecific ggo rll and residual pe still  ----------- 01/02/23 id assessment 11/21 tee negative for vegetation 11/24 repeat bcx in progress No focal discomfort including bone/joint Last fever earlier this morning; leukocytosis had resolved   Plan: Continue cefazolin As long as no sign of evolving bone joint infection, we'll plan to treat 4 weeks  total abx Her symptomatology and sepsis resolution should declare itself within the next 2-3 more days Plan 14 days iv abx and then transition to oral linezolid to finish the 4 week course F/u repeat bcx; timing of abx clock to start from the last persistently negative bcx We'll have the 2 weeks linezolid 600 bid for patient to have on hand, and she can start that when the iv cefazolin is to be finished Opat is to be placed today Midline can be placed Saturday if today's blood cx remains negative and her sepsis clears by then Discussed with primary team      Principal Problem:   Bacteremia Active Problems:   Invasive carcinoma of breast (HCC)   Port-A-Cath in place   Acute pulmonary embolism (HCC)   Sepsis (HCC)   Thrombocytopenia (HCC)   Anemia   Bloodstream infection due to Port-A-Cath   Endocarditis   No Known Allergies  Scheduled Meds:  apixaban  5 mg Oral BID   butamben-tetracaine-benzocaine       lidocaine       pantoprazole (PROTONIX) IV  40 mg Intravenous Q12H   Continuous Infusions:   ceFAZolin (ANCEF) IV 2 g (01/02/23 0558)   PRN Meds:.acetaminophen, butamben-tetracaine-benzocaine, lidocaine, ondansetron **OR** ondansetron (ZOFRAN) IV, mouth rinse   SUBJECTIVE: Feeling at baseline  tee done  this morning no vegetation No n/v/diarrhea No focal pain   Review of Systems: ROS All other ROS was negative, except mentioned above     OBJECTIVE: Vitals:   01/02/23 0815 01/02/23 0830 01/02/23 0835 01/02/23 0902  BP: 105/84 111/75 (!) 104/56 109/75  Pulse: (!) 105 (!) 107 (!) 101 (!) 101  Resp: 18 (!) 24 (!) 21 18  Temp:    98.5 F (36.9 C)  TempSrc:    Oral  SpO2: 98% 97% 97% 100%  Weight:      Height:       Body mass index is 27.86 kg/m.  Physical Exam General/constitutional: no distress, pleasant HEENT: Normocephalic, PER, Conj Clear, EOMI, Oropharynx clear CV: rrr no mrg Lungs: clear to auscultation, normal respiratory effort Abd: Soft,  Nontender Ext: no edema Neuro: nonfocal MSK: no peripheral joint swelling/tenderness/warmth    Lab Results Lab Results  Component Value Date   WBC 9.0 01/01/2023   WBC 9.1 01/01/2023   HGB 10.0 (L) 01/01/2023   HGB 10.0 (L) 01/01/2023   HCT 29.7 (L) 01/01/2023   HCT 30.4 (L) 01/01/2023   MCV 86.3 01/01/2023   MCV 87.6 01/01/2023   PLT 71 (L) 01/01/2023   PLT 72 (L) 01/01/2023    Lab Results  Component Value Date   CREATININE 0.71 01/01/2023   BUN 9 01/01/2023   NA 129 (L) 01/01/2023   K 3.7 01/01/2023   CL 103 01/01/2023   CO2 22 01/01/2023    Lab Results  Component Value Date   ALT 57 (H) 01/01/2023   AST 53 (H) 01/01/2023   ALKPHOS 74 01/01/2023   BILITOT 0.7 01/01/2023      Microbiology: Recent Results (from the past 240 hour(s))  Respiratory (~20 pathogens) panel by PCR     Status: None   Collection Time: 12/30/22  9:40 AM   Specimen: Nasopharyngeal Swab; Respiratory  Result Value Ref Range Status   Adenovirus NOT DETECTED NOT DETECTED Final   Coronavirus 229E NOT DETECTED NOT DETECTED Final    Comment: (NOTE) The Coronavirus on the Respiratory Panel, DOES NOT test for the novel  Coronavirus (2019 nCoV)    Coronavirus HKU1 NOT DETECTED NOT DETECTED Final   Coronavirus NL63 NOT DETECTED NOT DETECTED Final   Coronavirus OC43 NOT DETECTED NOT DETECTED Final   Metapneumovirus NOT DETECTED NOT DETECTED Final   Rhinovirus / Enterovirus NOT DETECTED NOT DETECTED Final   Influenza A NOT DETECTED NOT DETECTED Final   Influenza B NOT DETECTED NOT DETECTED Final   Parainfluenza Virus 1 NOT DETECTED NOT DETECTED Final   Parainfluenza Virus 2 NOT DETECTED NOT DETECTED Final   Parainfluenza Virus 3 NOT DETECTED NOT DETECTED Final   Parainfluenza Virus 4 NOT DETECTED NOT DETECTED Final   Respiratory Syncytial Virus NOT DETECTED NOT DETECTED Final   Bordetella pertussis NOT DETECTED NOT DETECTED Final   Bordetella Parapertussis NOT DETECTED NOT DETECTED Final    Chlamydophila pneumoniae NOT DETECTED NOT DETECTED Final   Mycoplasma pneumoniae NOT DETECTED NOT DETECTED Final    Comment: Performed at [facility], [address] [place], [place] [address]  Urine Culture     Status: Abnormal   Collection Time: 12/30/22  9:42 AM   Specimen: Urine, Clean Catch  Result Value Ref Range Status   Specimen Description   Final    URINE, CLEAN CATCH Performed at [facility], [address] [place], [place] [address]    Special Requests   Final  NONE Performed at [facility], [address], [place], [place] [address]    Culture (A)  Final    <10,000 COLONIES/mL INSIGNIFICANT GROWTH Performed at [facility], [address] [place], [place] [address]    Report Status 12/31/2022 FINAL  Final  Culture, blood (routine x 2)     Status: Abnormal   Collection Time: 12/30/22  9:56 AM   Specimen: BLOOD LEFT ARM  Result Value Ref Range Status   Specimen Description   Final    BLOOD LEFT ARM Performed at [facility], [address] [place], [place] [address]    Special Requests   Final    BOTTLES DRAWN AEROBIC AND ANAEROBIC Blood Culture adequate volume Performed at [facility], [address] [place], [place] [address]    Culture  Setup Time   Final    GRAM POSITIVE COCCI IN BOTH AEROBIC AND ANAEROBIC BOTTLES CRITICAL VALUE NOTED.  VALUE IS CONSISTENT WITH PREVIOUSLY REPORTED AND CALLED VALUE. Performed at [facility], [address] [place], [place] [address]    Culture (A)  Final    STAPHYLOCOCCUS AUREUS SUSCEPTIBILITIES PERFORMED ON PREVIOUS CULTURE WITHIN THE LAST 5 DAYS. Performed at [facility], [address] [place], [place] [address]    Report Status 01/02/2023 FINAL  Final  Culture, blood (routine x 2)     Status: Abnormal   Collection Time: 12/30/22 10:04 AM   Specimen: BLOOD RIGHT ARM  Result Value Ref Range Status   Specimen Description   Final    BLOOD RIGHT  ARM Performed at [facility], [address] [place], [place] [address]    Special Requests   Final    BOTTLES DRAWN AEROBIC AND ANAEROBIC Blood Culture adequate volume Performed at [facility], [address] [place], [place] [address]    Culture  Setup Time   Final    GRAM POSITIVE COCCI IN BOTH AEROBIC AND ANAEROBIC BOTTLES CRITICAL RESULT CALLED TO, READ BACK BY AND VERIFIED WITH: JASON ROBBINS@0555  12/31/22 RH Performed at [facility], [address] [place], [place] [address]    Culture STAPHYLOCOCCUS AUREUS (A)  Final   Report Status 01/02/2023 FINAL  Final   Organism ID, Bacteria STAPHYLOCOCCUS AUREUS  Final      Susceptibility   Staphylococcus aureus - MIC*    CIPROFLOXACIN <=0.5 SENSITIVE Sensitive     ERYTHROMYCIN >=8 RESISTANT Resistant     GENTAMICIN <=0.5 SENSITIVE Sensitive     OXACILLIN <=0.25 SENSITIVE Sensitive     TETRACYCLINE <=1 SENSITIVE Sensitive     VANCOMYCIN <=0.5 SENSITIVE Sensitive     TRIMETH/SULFA <=10 SENSITIVE Sensitive     CLINDAMYCIN RESISTANT Resistant     RIFAMPIN <=0.5 SENSITIVE Sensitive     Inducible Clindamycin POSITIVE Resistant     LINEZOLID 2 SENSITIVE Sensitive     * STAPHYLOCOCCUS AUREUS  Blood Culture ID Panel (Reflexed)     Status: Abnormal (Preliminary result)   Collection Time: 12/30/22 10:04 AM  Result Value Ref Range Status   Enterococcus faecalis NOT DETECTED NOT DETECTED Final   Enterococcus Faecium NOT DETECTED NOT DETECTED Final   Listeria monocytogenes NOT DETECTED NOT DETECTED Final   Staphylococcus species DETECTED (A) NOT DETECTED Final    Comment: CRITICAL RESULT CALLED TO, READ BACK BY AND VERIFIED WITH: JASON ROBBINS@0555  12/31/22 RH    Staphylococcus aureus (BCID) DETECTED (A) NOT DETECTED Final    Comment: CRITICAL RESULT CALLED TO, READ  BACK BY AND VERIFIED WITH: JASON ROBBINS@0555  12/31/22 RH    Staphylococcus epidermidis NOT DETECTED NOT DETECTED Final   Staphylococcus  lugdunensis NOT DETECTED NOT DETECTED Final   Streptococcus species NOT DETECTED NOT DETECTED Final   Streptococcus agalactiae NOT DETECTED NOT DETECTED Final   Streptococcus pneumoniae NOT DETECTED NOT DETECTED Final   Streptococcus pyogenes NOT DETECTED NOT DETECTED Final   A.calcoaceticus-baumannii PENDING NOT DETECTED Incomplete   Bacteroides fragilis PENDING NOT DETECTED Incomplete   Enterobacterales PENDING NOT DETECTED Incomplete   Enterobacter cloacae complex PENDING NOT DETECTED Incomplete   Escherichia coli PENDING NOT DETECTED Incomplete   Klebsiella aerogenes PENDING NOT DETECTED Incomplete   Klebsiella oxytoca PENDING NOT DETECTED Incomplete   Klebsiella pneumoniae PENDING NOT DETECTED Incomplete   Proteus species PENDING NOT DETECTED Incomplete   Salmonella species PENDING NOT DETECTED Incomplete   Serratia marcescens PENDING NOT DETECTED Incomplete   Haemophilus influenzae PENDING NOT DETECTED Incomplete   Neisseria meningitidis PENDING NOT DETECTED Incomplete   Pseudomonas aeruginosa PENDING NOT DETECTED Incomplete   Stenotrophomonas maltophilia PENDING NOT DETECTED Incomplete   Candida albicans PENDING NOT DETECTED Incomplete   Candida auris PENDING NOT DETECTED Incomplete   Candida glabrata PENDING NOT DETECTED Incomplete   Candida krusei PENDING NOT DETECTED Incomplete   Candida parapsilosis PENDING NOT DETECTED Incomplete   Candida tropicalis PENDING NOT DETECTED Incomplete   Cryptococcus neoformans/gattii PENDING NOT DETECTED Incomplete   Meth resistant mecA/C and MREJ NOT DETECTED NOT DETECTED Final    Comment: Performed at [facility], [address] [place], [place] [address]  Blood Culture (routine x 2)     Status: Abnormal (Preliminary result)   Collection Time: 12/31/22 11:00 AM   Specimen: BLOOD  Result Value Ref Range Status   Specimen Description   Final    BLOOD RIGHT ANTECUBITAL Performed at [facility], [address]  [place], [place] [address]    Special Requests   Final    BOTTLES DRAWN AEROBIC AND ANAEROBIC Blood Culture results may not be optimal due to an excessive volume of blood received in culture bottles Performed at [facility], [address] [place], [place] [address]    Culture  Setup Time   Final    Organism ID to follow GRAM POSITIVE COCCI IN BOTH AEROBIC AND ANAEROBIC BOTTLES CRITICAL RESULT CALLED TO, READ BACK BY AND VERIFIED WITH: JASON ROBBINS PHARMD @0307  01/01/23 ASW GRAM STAIN REVIEWED-AGREE WITH RESULT ADC    Culture (A)  Final    STAPHYLOCOCCUS AUREUS SUSCEPTIBILITIES PERFORMED ON PREVIOUS CULTURE WITHIN THE LAST 5 DAYS. Performed at [facility], [address], [place], [place] [address]    Report Status PENDING  Incomplete  Blood Culture ID Panel (Reflexed)     Status: Abnormal   Collection Time: 12/31/22 11:00 AM  Result Value Ref Range Status   Enterococcus faecalis NOT DETECTED NOT DETECTED Final   Enterococcus Faecium NOT DETECTED NOT DETECTED Final   Listeria monocytogenes NOT DETECTED NOT DETECTED Final   Staphylococcus species DETECTED (A) NOT DETECTED Final    Comment: CRITICAL RESULT CALLED TO, READ BACK BY AND VERIFIED WITH: JASON ROBBINS PHARMD @0307  01/01/23 ASW    Staphylococcus aureus (BCID) DETECTED (A) NOT DETECTED Final    Comment: CRITICAL RESULT CALLED TO, READ BACK BY AND VERIFIED WITH: JASON ROBBINS PHARMD @0307  01/01/23 ASW    Staphylococcus epidermidis NOT DETECTED NOT DETECTED Final   Staphylococcus lugdunensis NOT DETECTED NOT DETECTED Final   Streptococcus species NOT DETECTED NOT  DETECTED Final   Streptococcus agalactiae NOT DETECTED NOT DETECTED Final   Streptococcus pneumoniae NOT DETECTED NOT DETECTED Final   Streptococcus pyogenes NOT DETECTED NOT DETECTED Final   A.calcoaceticus-baumannii NOT DETECTED NOT DETECTED Final   Bacteroides fragilis NOT DETECTED NOT DETECTED Final   Enterobacterales NOT DETECTED NOT DETECTED  Final   Enterobacter cloacae complex NOT DETECTED NOT DETECTED Final   Escherichia coli NOT DETECTED NOT DETECTED Final   Klebsiella aerogenes NOT DETECTED NOT DETECTED Final   Klebsiella oxytoca NOT DETECTED NOT DETECTED Final   Klebsiella pneumoniae NOT DETECTED NOT DETECTED Final   Proteus species NOT DETECTED NOT DETECTED Final   Salmonella species NOT DETECTED NOT DETECTED Final   Serratia marcescens NOT DETECTED NOT DETECTED Final   Haemophilus influenzae NOT DETECTED NOT DETECTED Final   Neisseria meningitidis NOT DETECTED NOT DETECTED Final   Pseudomonas aeruginosa NOT DETECTED NOT DETECTED Final   Stenotrophomonas maltophilia NOT DETECTED NOT DETECTED Final   Candida albicans NOT DETECTED NOT DETECTED Final   Candida auris NOT DETECTED NOT DETECTED Final   Candida glabrata NOT DETECTED NOT DETECTED Final   Candida krusei NOT DETECTED NOT DETECTED Final   Candida parapsilosis NOT DETECTED NOT DETECTED Final   Candida tropicalis NOT DETECTED NOT DETECTED Final   Cryptococcus neoformans/gattii NOT DETECTED NOT DETECTED Final   Meth resistant mecA/C and MREJ NOT DETECTED NOT DETECTED Final    Comment: Performed at [facility], [address] [place], [place] [address]  Blood Culture (routine x 2)     Status: Abnormal (Preliminary result)   Collection Time: 12/31/22 11:05 AM   Specimen: BLOOD  Result Value Ref Range Status   Specimen Description   Final    BLOOD BLOOD LEFT ARM Performed at [facility], [address], [place], [place] [address]    Special Requests   Final    BOTTLES DRAWN AEROBIC AND ANAEROBIC Blood Culture adequate volume Performed at [facility], [address] [place], [place] [address]    Culture  Setup Time   Final    GRAM POSITIVE COCCI IN BOTH AEROBIC AND ANAEROBIC BOTTLES CRITICAL VALUE NOTED.  VALUE IS CONSISTENT WITH PREVIOUSLY REPORTED AND CALLED VALUE. GRAM STAIN REVIEWED-AGREE WITH RESULT ADC GRAM STAIN  REVIEWED-AGREE WITH RESULT DRT    Culture (A)  Final    STAPHYLOCOCCUS AUREUS SUSCEPTIBILITIES PERFORMED ON PREVIOUS CULTURE WITHIN THE LAST 5 DAYS. Performed at [facility], [address] [place], [place] [address]    Report Status PENDING  Incomplete  MRSA Next Gen by PCR, Nasal     Status: None   Collection Time: 12/31/22  9:17 PM   Specimen: Nasal Mucosa; Nasal Swab  Result Value Ref Range Status   MRSA by PCR Next Gen NOT DETECTED NOT DETECTED Final    Comment: (NOTE) The GeneXpert MRSA Assay (FDA approved for NASAL specimens only), is one component of a comprehensive MRSA colonization surveillance program. It is not intended to diagnose MRSA infection nor to guide or monitor treatment for MRSA infections. Test performance is not FDA approved in patients less than 46 years old. Performed at [facility], [address] [place], [place] [address]   Culture, blood (Routine X 2) w Reflex to ID Panel     Status: None (Preliminary result)   Collection Time: 01/02/23  5:40 AM   Specimen: BLOOD RIGHT ARM  Result Value Ref Range Status   Specimen Description BLOOD RIGHT ARM  Final   Special  Requests BOTTLES DRAWN AEROBIC AND ANAEROBIC  Final   Culture   Final    NO GROWTH < 12 HOURS Performed at [facility], [address] [place], [place] [address]    Report Status PENDING  Incomplete  Culture, blood (Routine X 2) w Reflex to ID Panel     Status: None (Preliminary result)   Collection Time: 01/02/23  5:40 AM   Specimen: BLOOD RIGHT HAND  Result Value Ref Range Status   Specimen Description BLOOD RIGHT HAND  Final   Special Requests BOTTLES DRAWN AEROBIC AND ANAEROBIC  Final   Culture   Final    NO GROWTH < 12 HOURS Performed at [facility], [address], [place], [place] [address]    Report Status PENDING  Incomplete     Serology:   Imaging: If present, new imagings (plain films, ct scans, and mri) have been personally  visualized and interpreted; radiology reports have been reviewed. Decision making incorporated into the Impression / Recommendations.  11/19 cta abd pelv chest IMPRESSION: CT CHEST IMPRESSION   1. Poor to moderate quality evaluation for pulmonary embolism. Possible small right lower lobe segmental to subsegmental pulmonary emboli. Of questionable clinical significance, given volume. 2. Right lower lobe airspace and ground-glass opacity, favoring pneumonia. If the patient does not have infectious symptoms, infarct is a secondary consideration. 3. No evidence of metastatic disease in the chest 4. Distal esophageal wall thickening, suspicious for esophagitis.   CT ABDOMEN AND PELVIS IMPRESSION   1. Multifactorial degraded evaluation of the abdomen and pelvis. 2. 6 mm right hepatic lobe lesion is too small to characterize, felt to be new since the prior exam. Favored to represent a cyst but technically indeterminate. Potential clinical strategies include follow-up on routine screening CTs versus further evaluation with nonemergent outpatient pre and post-contrast MRI. 3. Otherwise, no evidence of metastatic disease in the abdomen or pelvis. 4. Minimal bilateral caliectasis, similar and likely related to bladder distention. 5. Fibroid uterus 6. Right-sided hydrosalpinx, similar.   11/21 tee No vegetation  Raymondo Band, MD [facility] [phone] pager    01/02/2023, 11:16 AM

## 2023-01-02 NOTE — TOC Benefit Eligibility Note (Signed)
Pharmacy Patient Advocate Encounter  Insurance verification completed.    The patient is insured through [facility]   Ran test claim for Linezolid. Currently a quantity of 28 is a 14 day supply and the co-pay is $4.00 .   This test claim was processed through [facility]- copay amounts may vary at other pharmacies due to pharmacy/plan contracts, or as the patient moves through the different stages of their insurance plan.

## 2023-01-02 NOTE — Anesthesia Postprocedure Evaluation (Signed)
Anesthesia Post Note  Patient: CARRESSA KIESOW  Procedure(s) Performed: TRANSESOPHAGEAL ECHOCARDIOGRAM (TEE)  Patient location during evaluation: PACU Anesthesia Type: General Level of consciousness: awake and alert Pain management: pain level controlled Vital Signs Assessment: post-procedure vital signs reviewed and stable Respiratory status: spontaneous breathing, nonlabored ventilation, respiratory function stable and patient connected to nasal cannula oxygen Cardiovascular status: blood pressure returned to baseline and stable Postop Assessment: no apparent nausea or vomiting Anesthetic complications: no   No notable events documented.   Last Vitals:  Vitals:   01/02/23 0902 01/02/23 1123  BP: 109/75 107/75  Pulse: (!) 101 (!) 109  Resp: 18   Temp: 36.9 C 36.8 C  SpO2: 100% 100%    Last Pain:  Vitals:   01/02/23 0911  TempSrc:   PainSc: 0-No pain                 Yevette Edwards

## 2023-01-02 NOTE — Progress Notes (Addendum)
PHARMACY CONSULT NOTE FOR:  OUTPATIENT  PARENTERAL ANTIBIOTIC THERAPY (OPAT)  Indication: MSSA bacteremia Regimen: Cefazolin 2gm IV q8h End date: 01/15/2023  Once completes cefazilin, will start linezolid 600mg  PO BID 12/5 to 12/18.    Weekly labs: CBC/diff, CRP, BMP Please pull PIC/midline at completion of IV antibiotics Fax weekly labs to [phone]  IV antibiotic discharge orders are pended. To discharging provider:  please sign these orders via discharge navigator,  Select New Orders & click on the button choice - Manage This Unsigned Work.     Thank you for allowing pharmacy to be a part of this patient's care.  Juliette Alcide, PharmD, BCPS, BCIDP Work Cell: [phone] 01/02/2023 12:33 PM

## 2023-01-02 NOTE — Anesthesia Preprocedure Evaluation (Signed)
Anesthesia Evaluation  Patient identified by MRN, date of birth, ID band Patient awake    Reviewed: Allergy & Precautions, H&P , NPO status , Patient's Chart, lab work & pertinent test results, reviewed documented beta blocker date and time   Airway Mallampati: II   Neck ROM: full    Dental  (+) Poor Dentition   Pulmonary pneumonia, resolved   Pulmonary exam normal        Cardiovascular negative cardio ROS Normal cardiovascular exam Rhythm:regular Rate:Normal     Neuro/Psych negative neurological ROS  negative psych ROS   GI/Hepatic negative GI ROS, Neg liver ROS,,,  Endo/Other  negative endocrine ROS    Renal/GU negative Renal ROS  negative genitourinary   Musculoskeletal   Abdominal   Peds  Hematology  (+) Blood dyscrasia, anemia   Anesthesia Other Findings Past Medical History: No date: Breast lump No date: Fibroid, uterine No date: Microcytic anemia No date: STD (sexually transmitted disease)     Comment:  From Medical Hx; No date: Triple negative breast cancer [facility])     Comment:  Left Past Surgical History: 05/20/2019: BREAST BIOPSY; Right     Comment:  Affirm Bx- X- clip, neg 10/02/2022: BREAST BIOPSY; Left     Comment:  [place] Bx, path pending 10/02/2022: BREAST BIOPSY; Left     Comment:  [place] Bx Node- path pending 10/02/2022: BREAST BIOPSY; Left     Comment:  [place] LT BREAST BX W LOC DEV 1ST LESION IMG BX SPEC [place]               GUIDE 10/02/2022 [facility] No date: CESAREAN SECTION 02/09/2022: LAPAROSCOPIC BILATERAL SALPINGECTOMY; Bilateral     Comment:  Procedure: LAPAROSCOPIC BILATERAL SALPINGECTOMY;                Surgeon: Hildred Laser, MD;  Location: [facility] ORS;                Service: Gynecology;  Laterality: Bilateral; 10/17/2022: PORTACATH PLACEMENT; Right     Comment:  Procedure: INSERTION PORT-A-CATH;  Surgeon: Henrene Dodge, MD;  Location: [facility];  Service: General;                 Laterality: Right; No date: TUBAL LIGATION BMI    Body Mass Index: 27.86 kg/m     Reproductive/Obstetrics negative OB ROS                             Anesthesia Physical Anesthesia Plan  ASA: 2  Anesthesia Plan: General   Post-op Pain Management:    Induction:   PONV Risk Score and Plan:   Airway Management Planned:   Additional Equipment:   Intra-op Plan:   Post-operative Plan:   Informed Consent: I have reviewed the patients History and Physical, chart, labs and discussed the procedure including the risks, benefits and alternatives for the proposed anesthesia with the patient or authorized representative who has indicated his/her understanding and acceptance.     Dental Advisory Given  Plan Discussed with: CRNA  Anesthesia Plan Comments:        Anesthesia Quick Evaluation

## 2023-01-02 NOTE — Anesthesia Procedure Notes (Signed)
Date/Time: 01/02/2023 7:30 AM  Performed by: Stormy Fabian, CRNAPre-anesthesia Checklist: Patient identified, Emergency Drugs available, Suction available and Patient being monitored Patient Re-evaluated:Patient Re-evaluated prior to induction Oxygen Delivery Method: Nasal cannula Induction Type: IV induction Dental Injury: Teeth and Oropharynx as per pre-operative assessment  Comments: Nasal cannula with etCO2 monitoring

## 2023-01-03 ENCOUNTER — Other Ambulatory Visit: Payer: Medicaid Other

## 2023-01-03 ENCOUNTER — Other Ambulatory Visit: Payer: Self-pay

## 2023-01-03 ENCOUNTER — Ambulatory Visit: Payer: Medicaid Other

## 2023-01-03 DIAGNOSIS — C50919 Malignant neoplasm of unspecified site of unspecified female breast: Secondary | ICD-10-CM | POA: Diagnosis not present

## 2023-01-03 DIAGNOSIS — D696 Thrombocytopenia, unspecified: Secondary | ICD-10-CM | POA: Diagnosis not present

## 2023-01-03 DIAGNOSIS — I2699 Other pulmonary embolism without acute cor pulmonale: Secondary | ICD-10-CM | POA: Diagnosis not present

## 2023-01-03 DIAGNOSIS — R7881 Bacteremia: Secondary | ICD-10-CM | POA: Diagnosis not present

## 2023-01-03 LAB — CULTURE, BLOOD (ROUTINE X 2): Special Requests: ADEQUATE

## 2023-01-03 LAB — CBC
HCT: 30 % — ABNORMAL LOW (ref 36.0–46.0)
Hemoglobin: 9.8 g/dL — ABNORMAL LOW (ref 12.0–15.0)
MCH: 28.8 pg (ref 26.0–34.0)
MCHC: 32.7 g/dL (ref 30.0–36.0)
MCV: 88.2 fL (ref 80.0–100.0)
Platelets: 86 10*3/uL — ABNORMAL LOW (ref 150–400)
RBC: 3.4 MIL/uL — ABNORMAL LOW (ref 3.87–5.11)
RDW: 27.4 % — ABNORMAL HIGH (ref 11.5–15.5)
WBC: 8.4 10*3/uL (ref 4.0–10.5)
nRBC: 0 % (ref 0.0–0.2)

## 2023-01-03 LAB — BASIC METABOLIC PANEL
Anion gap: 10 (ref 5–15)
BUN: 8 mg/dL (ref 6–20)
CO2: 27 mmol/L (ref 22–32)
Calcium: 9.2 mg/dL (ref 8.9–10.3)
Chloride: 102 mmol/L (ref 98–111)
Creatinine, Ser: 0.6 mg/dL (ref 0.44–1.00)
GFR, Estimated: >60 mL/min (ref >60)
Glucose, Bld: 111 mg/dL — ABNORMAL HIGH (ref 70–99)
Potassium: 3.9 mmol/L (ref 3.5–5.1)
Sodium: 137 mmol/L (ref 135–145)

## 2023-01-03 LAB — VITAMIN B12: Vitamin B-12: 1252 pg/mL — ABNORMAL HIGH (ref 180–914)

## 2023-01-03 LAB — MAGNESIUM: Magnesium: 2.4 mg/dL (ref 1.7–2.4)

## 2023-01-03 LAB — HEMOGLOBIN A1C
Hgb A1c MFr Bld: 5.5 % (ref 4.8–5.6)
Mean Plasma Glucose: 111.15 mg/dL

## 2023-01-03 MED ORDER — LINEZOLID 600 MG PO TABS
600.0000 mg | ORAL_TABLET | Freq: Two times a day (BID) | ORAL | 0 refills | Status: DC
  Filled 2023-01-03 (×2): qty 28, 14d supply, fill #0

## 2023-01-03 MED ORDER — LINEZOLID 600 MG PO TABS: 600.0000 mg | ORAL_TABLET | Freq: Two times a day (BID) | ORAL | Status: DC

## 2023-01-03 MED ORDER — SIMETHICONE 80 MG PO CHEW
80.0000 mg | CHEWABLE_TABLET | Freq: Four times a day (QID) | ORAL | Status: DC | PRN
  Administered 2023-01-03 – 2023-01-05 (×4): 80 mg via ORAL
  Filled 2023-01-03 (×5): qty 1

## 2023-01-03 MED ORDER — LINEZOLID 600 MG PO TABS: 600.0000 mg | ORAL_TABLET | Freq: Two times a day (BID) | ORAL | Status: AC

## 2023-01-03 NOTE — Progress Notes (Signed)
Hematology/Oncology Progress note [phone] [phone]     Patient Care Team: Shane Crutch, [place] as PCP - General (Family Medicine) Hulen Luster, RN as Oncology Nurse Navigator [name], MD as Consulting Physician (Oncology)   Name of the patient: Penny Gomez  [id number]  October 24, 1985  Date of visit: 01/03/23   INTERVAL HISTORY-   Patient reports feeling better today.  Afebrile. Repeat blood culture prelim negative.  TEE neg for vegetation   No Known Allergies  Patient Active Problem List   Diagnosis Date Noted   Invasive carcinoma of breast (HCC) 10/10/2022    Priority: High   Acute febrile illness 12/30/2022    Priority: Medium    Hypokalemia 12/06/2022    Priority: Medium    Acute pulmonary embolism (HCC) 11/24/2022    Priority: Medium    BRCA1 gene mutation positive 11/11/2022    Priority: Medium    Encounter for antineoplastic chemotherapy 10/25/2022    Priority: Medium    IDA (iron deficiency anemia) 10/10/2022    Priority: Medium    Port-A-Cath in place 10/25/2022    Priority: Low   Goals of care, counseling/discussion 10/10/2022    Priority: Low   Family history of cancer 10/10/2022    Priority: Low   Bloodstream infection due to Port-A-Cath 01/02/2023   Endocarditis 01/02/2023   Thrombocytopenia (HCC) 01/01/2023   Anemia 01/01/2023   Bacteremia 12/31/2022   Sepsis (HCC) 12/31/2022   Vascular port complication 11/24/2022   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) 11/24/2022   Multifocal pneumonia 11/24/2022   Pulmonary infarct (HCC) 11/24/2022   Genetic testing 11/11/2022   Urinary frequency 03/19/2022     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     Past Surgical History:  Procedure Laterality Date   BREAST BIOPSY Right 05/20/2019   Affirm Bx- X- clip,  neg   BREAST BIOPSY Left 10/02/2022   [place] Bx, path pending   BREAST BIOPSY Left 10/02/2022   [place] Bx Node- path pending   BREAST BIOPSY Left 10/02/2022   [place] LT BREAST BX W LOC DEV 1ST LESION IMG BX SPEC US GUIDE 10/02/2022 [facility]   CESAREAN SECTION     LAPAROSCOPIC BILATERAL SALPINGECTOMY Bilateral 02/09/2022   Procedure: LAPAROSCOPIC BILATERAL SALPINGECTOMY;  Surgeon: Hildred Laser, MD;  Location: [facility];  Service: Gynecology;  Laterality: Bilateral;   PORTACATH PLACEMENT Right 10/17/2022   Procedure: INSERTION PORT-A-CATH;  Surgeon: Henrene Dodge, MD;  Location: [facility];  Service: General;  Laterality: Right;   TEE WITHOUT CARDIOVERSION N/A 01/02/2023   Procedure: TRANSESOPHAGEAL ECHOCARDIOGRAM (TEE);  Surgeon: Antonieta Iba, MD;  Location: [facility];  Service: Cardiovascular;  Laterality: N/A;   TUBAL LIGATION      Social History   Socioeconomic History   Marital status: Single    Spouse name: Not on file   Number of children: 2   Years of education: Not on file   Highest education level: High school graduate  Occupational History   Not on file  Tobacco Use   Smoking status: Never    Passive exposure: Never   Smokeless tobacco: Never  Vaping Use   Vaping status: Never Used  Substance and Sexual Activity   Alcohol use: Yes    Comment: occ   Drug use: No  Sexual activity: Yes    Birth control/protection: Surgical  Other Topics Concern   Not on file  Social History Narrative   Not on file   Social Determinants of Health   Financial Resource Strain: Low Risk  (10/10/2022)   Overall Financial Resource Strain (CARDIA)    Difficulty of Paying Living Expenses: Not very hard  Food Insecurity: No Food Insecurity (12/31/2022)   Hunger Vital Sign    Worried About Running Out of Food in the Last Year: Never true    Ran Out of Food in the Last Year: Never true  Transportation Needs: No Transportation Needs (12/31/2022)   PRAPARE - Scientist, research (physical sciences) (Medical): No    Lack of Transportation (Non-Medical): No  Physical Activity: Not on file  Stress: No Stress Concern Present (10/10/2022)   [employer] of Occupational Health - Occupational Stress Questionnaire    Feeling of Stress : Only a little  Social Connections: Not on file  Intimate Partner Violence: Not At Risk (12/31/2022)   Humiliation, Afraid, Rape, and Kick questionnaire    Fear of Current or Ex-Partner: No    Emotionally Abused: No    Physically Abused: No    Sexually Abused: No     Family History  Problem Relation Age of Onset   Breast cancer Mother 38       no GT   Breast cancer Maternal Aunt 33   Breast cancer Maternal Aunt    Lung cancer Maternal Uncle    Lung cancer Maternal Uncle    Bone cancer Paternal Aunt    Pancreatic cancer Maternal Grandmother 69   Lung cancer Maternal Grandfather 8   Breast cancer Cousin 40   Breast cancer Other 33       gene pos?     Current Facility-Administered Medications:    acetaminophen (TYLENOL) tablet 650 mg, 650 mg, Oral, Q6H PRN, Floydene Flock, MD, 650 mg at 01/02/23 2141   apixaban (ELIQUIS) tablet 5 mg, 5 mg, Oral, BID, [name], MD, 5 mg at 01/03/23 2139   ceFAZolin (ANCEF) IVPB 2g/100 mL premix, 2 g, Intravenous, Q8H, Vu, Trung T, MD, Last Rate: 200 mL/hr at 01/03/23 2147, 2 g at 01/03/23 2147   [START ON 01/17/2023] linezolid (ZYVOX) tablet 600 mg, 600 mg, Oral, Q12H, Vu, Trung T, MD   ondansetron (ZOFRAN) tablet 4 mg, 4 mg, Oral, Q6H PRN **OR** ondansetron (ZOFRAN) injection 4 mg, 4 mg, Intravenous, Q6H PRN, Floydene Flock, MD, 4 mg at 12/31/22 2012   Oral care mouth rinse, 15 mL, Mouth Rinse, PRN, Darlin Priestly, MD   pantoprazole (PROTONIX) injection 40 mg, 40 mg, Intravenous, Q12H, Floydene Flock, MD, 40 mg at 01/03/23 2140   polyethylene glycol (MIRALAX / GLYCOLAX) packet 17 g, 17 g, Oral, BID PRN, Darlin Priestly, MD, 17 g at 01/03/23 2140   simethicone (MYLICON) chewable tablet 80 mg, 80 mg,  Oral, Q6H PRN, Darlin Priestly, MD, 80 mg at 01/03/23 2140   Physical exam:  Vitals:   01/03/23 0500 01/03/23 0843 01/03/23 1618 01/03/23 2001  BP: 100/68 117/74 111/72 105/76  Pulse: 82 90 100 98  Resp: 16 17 20 18   Temp: 98 F (36.7 C) 97.6 F (36.4 C) 98.9 F (37.2 C) 98.8 F (37.1 C)  TempSrc:   Oral   SpO2: 98% 100% 99% 99%  Weight:      Height:       Physical Exam Constitutional:      General: She  is not in acute distress. HENT:     Head: Normocephalic and atraumatic.  Eyes:     General: No scleral icterus. Cardiovascular:     Rate and Rhythm: Normal rate.  Pulmonary:     Effort: Pulmonary effort is normal. No respiratory distress.  Abdominal:     General: There is no distension.  Musculoskeletal:        General: Normal range of motion.     Cervical back: Normal range of motion and neck supple.  Skin:    Findings: No rash.  Neurological:     Mental Status: She is alert and oriented to person, place, and time. Mental status is at baseline.     Motor: No abnormal muscle tone.  Psychiatric:        Mood and Affect: Mood and affect normal.       Labs    Latest Ref Rng & Units 01/03/2023    6:18 AM 01/01/2023    3:46 AM 12/31/2022   11:17 AM  CBC  WBC 4.0 - 10.5 K/uL 8.4  9.0    9.1  19.6   Hemoglobin 12.0 - 15.0 g/dL 9.8  30.8    65.7  84.6   Hematocrit 36.0 - 46.0 % 30.0  29.7    30.4  37.8   Platelets 150 - 400 K/uL 86  71    72  136       Latest Ref Rng & Units 01/03/2023    6:18 AM 01/01/2023    3:46 AM 12/31/2022   11:17 AM  CMP  Glucose 70 - 99 mg/dL 962  952  841   BUN 6 - 20 mg/dL 8  9  9    Creatinine 0.44 - 1.00 mg/dL 3.24  4.01  0.27   Sodium 135 - 145 mmol/L 137  129  131   Potassium 3.5 - 5.1 mmol/L 3.9  3.7  3.4   Chloride 98 - 111 mmol/L 102  103  97   CO2 22 - 32 mmol/L 27  22  24    Calcium 8.9 - 10.3 mg/dL 9.2  8.0  9.2   Total Protein 6.5 - 8.1 g/dL  6.6  8.7   Total Bilirubin <1.2 mg/dL  0.7  1.2   Alkaline Phos 38 - 126 U/L   74  106   AST 15 - 41 U/L  53  60   ALT 0 - 44 U/L  57  68      RADIOGRAPHIC STUDIES: I have personally reviewed the radiological images as listed and agreed with the findings in the report. DG Chest 2 View  Result Date: 01/02/2023 CLINICAL DATA:  Fever and congestion for 1 week EXAM: CHEST - 2 VIEW COMPARISON:  11/24/2022 FINDINGS: Cardiac shadow is within normal limits. Right chest wall port is noted with catheter tip in the proximal superior vena cava. No pneumothorax is noted. Lungs are clear. No bony abnormality is noted. IMPRESSION: No acute abnormality seen. Electronically Signed   By: Alcide Clever M.D.   On: 01/02/2023 22:58   DG Chest Port 1 View  Result Date: 01/02/2023 CLINICAL DATA:  Left-sided chest and back pain, initial encounter EXAM: PORTABLE CHEST 1 VIEW COMPARISON:  12/30/2022 FINDINGS: Cardiac shadow is within normal limits. The lungs are hypoinflated. No focal infiltrate is seen. Scattered large and small bowel gas is noted. No bony abnormality is seen. Right chest wall port has been removed. IMPRESSION: No acute abnormality noted. Electronically Signed   By: Loraine Leriche  Lukens M.D.   On: 01/02/2023 22:58   ECHO TEE  Result Date: 01/02/2023    TRANSESOPHOGEAL ECHO REPORT   Patient Name:   MAYZELL BANKEN Date of Exam: 01/02/2023 Medical Rec #:  [id number]       Height:       60.0 in Accession #:    [id number]      Weight:       142.6 lb Date of Birth:  09-12-1985        BSA:          1.617 m Patient Age:    37 years        BP:           116/75 mmHg Patient Gender: F               HR:           106 bpm. Exam Location:  [facility] Procedure: Transesophageal Echo, Cardiac Doppler, Color Doppler and Saline            Contrast Bubble Study Indications:     Bacteremia R78.81  History:         Patient has prior history of Echocardiogram examinations, most                  recent 12/31/2022. Breast cancer.  Sonographer:     Cristela Blue Referring Phys:  [id number] CHRISTOPHER RONALD BERGE Diagnosing  Phys: Julien Nordmann MD PROCEDURE: After discussion of the risks and benefits of a TEE, an informed consent was obtained from the patient. TEE procedure time was 30 minutes. The transesophogeal probe was passed without difficulty through the esophogus of the patient. Imaged were obtained with the patient in a left lateral decubitus position. Local oropharyngeal anesthetic was provided with Cetacaine and viscous lidocaine. Sedation performed by different physician. Image quality was excellent. The patient's vital signs; including heart rate, blood pressure, and oxygen saturation; remained stable throughout the procedure. The patient developed no complications during the procedure.  IMPRESSIONS  1. No valve vegetation noted  2. Left ventricular ejection fraction, by estimation, is 60 to 65%. The left ventricle has normal function. The left ventricle has no regional wall motion abnormalities.  3. Right ventricular systolic function is normal. The right ventricular size is normal.  4. No left atrial/left atrial appendage thrombus was detected.  5. The mitral valve is normal in structure. No evidence of mitral valve regurgitation. No evidence of mitral stenosis.  6. The aortic valve is normal in structure. Aortic valve regurgitation is not visualized. No aortic stenosis is present.  7. The inferior vena cava is normal in size with greater than 50% respiratory variability, suggesting right atrial pressure of 3 mmHg.  8. Agitated saline contrast bubble study was negative, with no evidence of any interatrial shunt. Conclusion(s)/Recommendation(s): Normal biventricular function without evidence of hemodynamically significant valvular heart disease. FINDINGS  Left Ventricle: Left ventricular ejection fraction, by estimation, is 60 to 65%. The left ventricle has normal function. The left ventricle has no regional wall motion abnormalities. The left ventricular internal cavity size was normal in size. There is  no left  ventricular hypertrophy. Right Ventricle: The right ventricular size is normal. No increase in right ventricular wall thickness. Right ventricular systolic function is normal. Left Atrium: Left atrial size was normal in size. No left atrial/left atrial appendage thrombus was detected. Right Atrium: Right atrial size was normal in size. Pericardium: There is no evidence of pericardial effusion. Mitral Valve: The mitral valve  is normal in structure. No evidence of mitral valve regurgitation. No evidence of mitral valve stenosis. There is no evidence of mitral valve vegetation. Tricuspid Valve: The tricuspid valve is normal in structure. Tricuspid valve regurgitation is not demonstrated. No evidence of tricuspid stenosis. There is no evidence of tricuspid valve vegetation. Aortic Valve: The aortic valve is normal in structure. Aortic valve regurgitation is not visualized. No aortic stenosis is present. There is no evidence of aortic valve vegetation. Pulmonic Valve: The pulmonic valve was normal in structure. Pulmonic valve regurgitation is not visualized. No evidence of pulmonic stenosis. There is no evidence of pulmonic valve vegetation. Aorta: The aortic root is normal in size and structure. Venous: The inferior vena cava is normal in size with greater than 50% respiratory variability, suggesting right atrial pressure of 3 mmHg. IAS/Shunts: No atrial level shunt detected by color flow Doppler. Agitated saline contrast was given intravenously to evaluate for intracardiac shunting. Agitated saline contrast bubble study was negative, with no evidence of any interatrial shunt. There  is no evidence of a patent foramen ovale. There is no evidence of an atrial septal defect. Julien Nordmann MD Electronically signed by Julien Nordmann MD Signature Date/Time: 01/02/2023/4:40:37 PM    Final    ECHOCARDIOGRAM LIMITED  Result Date: 01/01/2023    ECHOCARDIOGRAM LIMITED REPORT   Patient Name:   NAVEY SLEDZ Date of Exam:  12/31/2022 Medical Rec #:  [id number]       Height:       60.0 in Accession #:    [id number]      Weight:       141.8 lb Date of Birth:  1985/03/11        BSA:          1.613 m Patient Age:    37 years        BP:           122/63 mmHg Patient Gender: F               HR:           113 bpm. Exam Location:  [facility] Procedure: 2D Echo, Limited Echo, Limited Color Doppler, 3D Echo and Strain            Analysis Indications:     R78.81 Bacteremia.  History:         Patient has prior history of Echocardiogram examinations, most                  recent 10/17/2022. Breast cancer.  Sonographer:     Daphine Deutscher RDCS Referring Phys:  [id number] T VU Diagnosing Phys: Julien Nordmann MD IMPRESSIONS  1. Left ventricular ejection fraction, by estimation, is 60 to 65%. The left ventricle has normal function. The left ventricle has no regional wall motion abnormalities. Left ventricular diastolic parameters were normal.  2. Right ventricular systolic function is normal. The right ventricular size is normal. Tricuspid regurgitation signal is inadequate for assessing PA pressure.  3. The mitral valve is normal in structure. Mild mitral valve regurgitation. No evidence of mitral stenosis.  4. The aortic valve is normal in structure. Aortic valve regurgitation is not visualized. No aortic stenosis is present.  5. The inferior vena cava is normal in size with greater than 50% respiratory variability, suggesting right atrial pressure of 3 mmHg. FINDINGS  Left Ventricle: Left ventricular ejection fraction, by estimation, is 60 to 65%. The left ventricle has normal function. The left ventricle has no regional  wall motion abnormalities. The left ventricular internal cavity size was normal in size. There is  no left ventricular hypertrophy. Left ventricular diastolic parameters were normal. Right Ventricle: The right ventricular size is normal. No increase in right ventricular wall thickness. Right ventricular systolic function is  normal. Tricuspid regurgitation signal is inadequate for assessing PA pressure. Left Atrium: Left atrial size was normal in size. Right Atrium: Right atrial size was normal in size. Pericardium: There is no evidence of pericardial effusion. Mitral Valve: The mitral valve is normal in structure. Mild mitral valve regurgitation. No evidence of mitral valve stenosis. Tricuspid Valve: The tricuspid valve is normal in structure. Tricuspid valve regurgitation is not demonstrated. No evidence of tricuspid stenosis. Aortic Valve: The aortic valve is normal in structure. Aortic valve regurgitation is not visualized. No aortic stenosis is present. Pulmonic Valve: The pulmonic valve was normal in structure. Pulmonic valve regurgitation is not visualized. No evidence of pulmonic stenosis. Aorta: The aortic root is normal in size and structure. Venous: The inferior vena cava is normal in size with greater than 50% respiratory variability, suggesting right atrial pressure of 3 mmHg. IAS/Shunts: No atrial level shunt detected by color flow Doppler. LEFT VENTRICLE PLAX 2D LVIDd:         4.20 cm   Diastology LVIDs:         2.20 cm   LV e' medial:    12.23 cm/s LV PW:         0.90 cm   LV E/e' medial:  8.3 LV IVS:        0.90 cm   LV e' lateral:   15.35 cm/s LVOT diam:     2.10 cm   LV E/e' lateral: 6.6 LV SV:         64 LV SV Index:   40        2D Longitudinal Strain LVOT Area:     3.46 cm  2D Strain GLS (A2C):   -24.5 %                          2D Strain GLS (A3C):   -24.0 %                          2D Strain GLS (A4C):   -24.5 %                          2D Strain GLS Avg:     -24.3 %                           3D Volume EF:                          3D EF:        56 %                          LV EDV:       111 ml                          LV ESV:       48 ml  LV SV:        63 ml RIGHT VENTRICLE RV Basal diam:  3.70 cm RV S prime:     20.90 cm/s TAPSE (M-mode): 2.8 cm LEFT ATRIUM             Index        RIGHT  ATRIUM           Index LA diam:        4.20 cm 2.60 cm/m   RA Area:     15.10 cm LA Vol (A2C):   18.2 ml 11.29 ml/m  RA Volume:   43.30 ml  26.85 ml/m LA Vol (A4C):   23.7 ml 14.70 ml/m LA Biplane Vol: 21.3 ml 13.21 ml/m  AORTIC VALVE LVOT Vmax:   129.33 cm/s LVOT Vmean:  86.800 cm/s LVOT VTI:    0.185 m  AORTA Ao Root diam: 2.90 cm Ao Asc diam:  2.80 cm MITRAL VALVE MV Area (PHT): 5.66 cm     SHUNTS MV Decel Time: 134 msec     Systemic VTI:  0.18 m MV E velocity: 101.50 cm/s  Systemic Diam: 2.10 cm MV A velocity: 97.80 cm/s MV E/A ratio:  1.04 Julien Nordmann MD Electronically signed by Julien Nordmann MD Signature Date/Time: 01/01/2023/3:27:39 PM    Final    CT Angio Chest Pulmonary Embolism (PE) W or WO Contrast  Result Date: 12/31/2022 CLINICAL DATA:  Elevated liver function tests. Fever. Abdominal pain. Positive blood culture. * Tracking Code: BO * breast cancer. EXAM: CT ANGIOGRAPHY CHEST CT ABDOMEN AND PELVIS WITH CONTRAST TECHNIQUE: Multidetector CT imaging of the chest was performed using the standard protocol during bolus administration of intravenous contrast. Multiplanar CT image reconstructions and MIPs were obtained to evaluate the vascular anatomy. Multidetector CT imaging of the abdomen and pelvis was performed using the standard protocol during bolus administration of intravenous contrast. RADIATION DOSE REDUCTION: This exam was performed according to the departmental dose-optimization program which includes automated exposure control, adjustment of the mA and/or kV according to patient size and/or use of iterative reconstruction technique. CONTRAST:  75mL OMNIPAQUE IOHEXOL 350 MG/ML SOLN COMPARISON:  03/20/2020 abdominopelvic CT. FINDINGS: CTA CHEST FINDINGS Cardiovascular: The quality of this exam for evaluation of pulmonary embolism is poor to moderate. The bolus is suboptimally timed, centered in the SVC. In addition, there is minimal motion. No central or large lobar pulmonary  embolism. Possible small volume pulmonary emboli to the right lower lobe, including within segmental branches on 140/5. Right Port-A-Cath tip high right atrium. subtle soft tissue fullness at the Port-A-Cath insertion site in the neck including on 08/04 is likely related to recent port placement. Normal aortic caliber. Mild cardiomegaly, without pericardial effusion. Mediastinum/Nodes: No axillary or subpectoral adenopathy. No mediastinal or hilar adenopathy. Distal esophageal wall thickening is mild-to-moderate on 81/4. No internal mammary adenopathy. Lungs/Pleura: No pleural fluid. Minimal motion degradation in the lower chest. Dependent right lower lobe airspace and ground-glass opacity. 2 mm subpleural right upper lobe pulmonary nodule on 34/6, nonspecific. Right middle lobe pulmonary nodules x2 including up to maximally 3 mm on 71/6 were present on 03/20/2020 and considered benign. Musculoskeletal: No acute osseous abnormality. Review of the MIP images confirms the above findings. CT ABDOMEN and PELVIS FINDINGS Hepatobiliary: Mild limitations secondary to patient arm position, EKG wires and lead artifacts. Hepatomegaly at 21.4 cm. 6 mm subcapsular right hepatic lobe lesion is not readily apparent on the prior. A central left hepatic lobe 4 mm hypoattenuating lesion is present on the  prior and likely a cyst. Normal gallbladder, without biliary ductal dilatation. Pancreas: Normal, without mass or ductal dilatation. Spleen: Normal in size, without focal abnormality. Adrenals/Urinary Tract: Normal adrenal glands. Minimal bilateral caliectasis without significant hydroureter. The bladder is mildly distended, likely causative. Appearance is relatively similar to 03/20/2020. Stomach/Bowel: Normal stomach, without wall thickening. Normal large and small bowel loops. Vascular/Lymphatic: Normal caliber of the aorta and branch vessels. No abdominopelvic adenopathy. Reproductive: Fibroid uterus again identified. Uterine  masses up to 3.4 cm. Tubular right adnexal cystic structure is again favored to represent hydrosalpinx. Relatively similar at up to 2.5 cm on 79/11. Other: Trace free pelvic fluid is likely physiologic. No abdominal ascites or free intraperitoneal air. Musculoskeletal: No acute osseous abnormality. Review of the MIP images confirms the above findings. IMPRESSION: CT CHEST IMPRESSION 1. Poor to moderate quality evaluation for pulmonary embolism. Possible small right lower lobe segmental to subsegmental pulmonary emboli. Of questionable clinical significance, given volume. 2. Right lower lobe airspace and ground-glass opacity, favoring pneumonia. If the patient does not have infectious symptoms, infarct is a secondary consideration. 3. No evidence of metastatic disease in the chest 4. Distal esophageal wall thickening, suspicious for esophagitis. CT ABDOMEN AND PELVIS IMPRESSION 1. Multifactorial degraded evaluation of the abdomen and pelvis. 2. 6 mm right hepatic lobe lesion is too small to characterize, felt to be new since the prior exam. Favored to represent a cyst but technically indeterminate. Potential clinical strategies include follow-up on routine screening CTs versus further evaluation with nonemergent outpatient pre and post-contrast MRI. 3. Otherwise, no evidence of metastatic disease in the abdomen or pelvis. 4. Minimal bilateral caliectasis, similar and likely related to bladder distention. 5. Fibroid uterus 6. Right-sided hydrosalpinx, similar. Electronically Signed   By: Jeronimo Greaves M.D.   On: 12/31/2022 15:07   CT ABDOMEN PELVIS W CONTRAST  Result Date: 12/31/2022 CLINICAL DATA:  Elevated liver function tests. Fever. Abdominal pain. Positive blood culture. * Tracking Code: BO * breast cancer. EXAM: CT ANGIOGRAPHY CHEST CT ABDOMEN AND PELVIS WITH CONTRAST TECHNIQUE: Multidetector CT imaging of the chest was performed using the standard protocol during bolus administration of intravenous  contrast. Multiplanar CT image reconstructions and MIPs were obtained to evaluate the vascular anatomy. Multidetector CT imaging of the abdomen and pelvis was performed using the standard protocol during bolus administration of intravenous contrast. RADIATION DOSE REDUCTION: This exam was performed according to the departmental dose-optimization program which includes automated exposure control, adjustment of the mA and/or kV according to patient size and/or use of iterative reconstruction technique. CONTRAST:  75mL OMNIPAQUE IOHEXOL 350 MG/ML SOLN COMPARISON:  03/20/2020 abdominopelvic CT. FINDINGS: CTA CHEST FINDINGS Cardiovascular: The quality of this exam for evaluation of pulmonary embolism is poor to moderate. The bolus is suboptimally timed, centered in the SVC. In addition, there is minimal motion. No central or large lobar pulmonary embolism. Possible small volume pulmonary emboli to the right lower lobe, including within segmental branches on 140/5. Right Port-A-Cath tip high right atrium. subtle soft tissue fullness at the Port-A-Cath insertion site in the neck including on 08/04 is likely related to recent port placement. Normal aortic caliber. Mild cardiomegaly, without pericardial effusion. Mediastinum/Nodes: No axillary or subpectoral adenopathy. No mediastinal or hilar adenopathy. Distal esophageal wall thickening is mild-to-moderate on 81/4. No internal mammary adenopathy. Lungs/Pleura: No pleural fluid. Minimal motion degradation in the lower chest. Dependent right lower lobe airspace and ground-glass opacity. 2 mm subpleural right upper lobe pulmonary nodule on 34/6, nonspecific. Right middle lobe pulmonary nodules  x2 including up to maximally 3 mm on 71/6 were present on 03/20/2020 and considered benign. Musculoskeletal: No acute osseous abnormality. Review of the MIP images confirms the above findings. CT ABDOMEN and PELVIS FINDINGS Hepatobiliary: Mild limitations secondary to patient arm  position, EKG wires and lead artifacts. Hepatomegaly at 21.4 cm. 6 mm subcapsular right hepatic lobe lesion is not readily apparent on the prior. A central left hepatic lobe 4 mm hypoattenuating lesion is present on the prior and likely a cyst. Normal gallbladder, without biliary ductal dilatation. Pancreas: Normal, without mass or ductal dilatation. Spleen: Normal in size, without focal abnormality. Adrenals/Urinary Tract: Normal adrenal glands. Minimal bilateral caliectasis without significant hydroureter. The bladder is mildly distended, likely causative. Appearance is relatively similar to 03/20/2020. Stomach/Bowel: Normal stomach, without wall thickening. Normal large and small bowel loops. Vascular/Lymphatic: Normal caliber of the aorta and branch vessels. No abdominopelvic adenopathy. Reproductive: Fibroid uterus again identified. Uterine masses up to 3.4 cm. Tubular right adnexal cystic structure is again favored to represent hydrosalpinx. Relatively similar at up to 2.5 cm on 79/11. Other: Trace free pelvic fluid is likely physiologic. No abdominal ascites or free intraperitoneal air. Musculoskeletal: No acute osseous abnormality. Review of the MIP images confirms the above findings. IMPRESSION: CT CHEST IMPRESSION 1. Poor to moderate quality evaluation for pulmonary embolism. Possible small right lower lobe segmental to subsegmental pulmonary emboli. Of questionable clinical significance, given volume. 2. Right lower lobe airspace and ground-glass opacity, favoring pneumonia. If the patient does not have infectious symptoms, infarct is a secondary consideration. 3. No evidence of metastatic disease in the chest 4. Distal esophageal wall thickening, suspicious for esophagitis. CT ABDOMEN AND PELVIS IMPRESSION 1. Multifactorial degraded evaluation of the abdomen and pelvis. 2. 6 mm right hepatic lobe lesion is too small to characterize, felt to be new since the prior exam. Favored to represent a cyst but  technically indeterminate. Potential clinical strategies include follow-up on routine screening CTs versus further evaluation with nonemergent outpatient pre and post-contrast MRI. 3. Otherwise, no evidence of metastatic disease in the abdomen or pelvis. 4. Minimal bilateral caliectasis, similar and likely related to bladder distention. 5. Fibroid uterus 6. Right-sided hydrosalpinx, similar. Electronically Signed   By: Jeronimo Greaves M.D.   On: 12/31/2022 15:07    Assessment and plan-   Staph aureus bacteremia, likely secondary to infected Mediport.  Status post port removal. Possible pneumonia TEE neg for vegetation  ID recommendation was reviewed.  Cefazolin 2 gram iv q8hr x2 weeks until 12/04, then start linezolid 600 mg po bid 2 weeks until 12/18.    # Pulmonary embolism, Small volume pulmonary emboli to the right lower lobe. Patient reports being compliant with Eliquis 5 mg twice daily this may be residual PE Continue anticoagulation with Eliquis 5 mg twice daily.   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   # Thrombocytopenia, check immature platelet fraction, tech smear Possibly due to acute illness, recent chemotherapy, or drug induced.  She is on anticoagulation. Monitor closely.   # Anemia, likely due to acute illness and recent chemotherapy.  Monitor.   Thank you for allowing me to participate in the care of this patient.   [name], MD, PhD Hematology Oncology 01/03/2023

## 2023-01-03 NOTE — Progress Notes (Signed)
01/03/2023  Subjective: Patient is s/p removal of infected right internal jugular port-a-cath.  No acute events.  Her TEE yesterday did not how any valvular vegetations.  Her most recent blood culture from yesterday currently showing no growth.  Has not had any fevers recently.  Denies any worsening pain at the prior port site.  Vital signs: Temp:  [97.6 F (36.4 C)-99.2 F (37.3 C)] 97.6 F (36.4 C) (11/22 0843) Pulse Rate:  [82-90] 90 (11/22 0843) Resp:  [16-18] 17 (11/22 0843) BP: (100-122)/(68-77) 117/74 (11/22 0843) SpO2:  [98 %-100 %] 100 % (11/22 0843)   Intake/Output: 11/21 0701 - 11/22 0700 In: 127.8 [IV Piggyback:127.8] Out: -  Last BM Date : 12/30/22  Physical Exam: Constitutional: No acute distress Skin:  Right upper chest wound is stable, with some serosanguinous drainage, no further purulence or necrotic tissue.  Labs:  Recent Labs    01/01/23 0346 01/03/23 0618  WBC 9.1  9.0 8.4  HGB 10.0*  10.0* 9.8*  HCT 30.4*  29.7* 30.0*  PLT 72*  71* 86*   Recent Labs    01/01/23 0346 01/03/23 0618  NA 129* 137  K 3.7 3.9  CL 103 102  CO2 22 27  GLUCOSE 174* 111*  BUN 9 8  CREATININE 0.71 0.60  CALCIUM 8.0* 9.2   No results for input(s): "LABPROT", "INR" in the last 72 hours.  Imaging: DG Chest Port 1 View  Result Date: 01/02/2023 CLINICAL DATA:  Left-sided chest and back pain, initial encounter EXAM: PORTABLE CHEST 1 VIEW COMPARISON:  12/30/2022 FINDINGS: Cardiac shadow is within normal limits. The lungs are hypoinflated. No focal infiltrate is seen. Scattered large and small bowel gas is noted. No bony abnormality is seen. Right chest wall port has been removed. IMPRESSION: No acute abnormality noted. Electronically Signed   By: Alcide Clever M.D.   On: 01/02/2023 22:58    Assessment/Plan: This is a 37 y.o. female s/p removal of infected right internal jugular port-a-cath.  --Discussed yesterday with oncology and ID teams, and they recommend waiting  4 weeks prior to next port placement.  Discussed with the patient that likely we would have a port on the left side instead of the right side to avoid any possible contamination.  This can be done as an outpatient.  Will ask the office to reach out to her next week to schedule follow up with me for wound check and to schedule her procedure. --Continue abx per ID team.   Howie Ill, MD [facility]

## 2023-01-03 NOTE — Progress Notes (Signed)
  PROGRESS NOTE    [name]  ZOX:[id number] DOB: Jul 09, 1985 DOA: 12/31/2022 PCP: Shane Crutch, PA  127A/127A-AA  LOS: 3 days   Brief hospital course:   Assessment & Plan: Penny Gomez is a 37 y.o. female with medical history significant of left breast cancer being followed by Dr. Cathie Hoops outpatient, pulmonary embolism on Eliquis, presenting with sepsis, Staph aureus bacteremia, infected port.     * MSSA Bacteremia 2/2  Port infection Noted evaluation 11/18 w/ oncology for febrile illness  Blood cultures growing Staph aureus  Case discussed w/ ID Dr. Renold Don  S/p port removal --TEE neg for vegetation Plan: --cont IV cefazolin --at discharge, Cefazolin 2 gram iv q8hr x2 weeks until 12/04, then start linezolid 600 mg po bid 2 weeks until 12/18. --need blood cx neg 3 days and no fever 2 days before discharge.  Hx of pulmonary embolism (HCC) Formally diagnosed on 10/14  --cont Eliquis  Sepsis (HCC) --fever, tachycardia, bacteremia  Invasive carcinoma of breast (HCC) Followed by Dr. Cathie Hoops outpt  Currently on carbo/taxol/Keytruda  --Onc consulted with Dr. Cathie Hoops --rec waiting 4 weeks prior to port replacement  Thrombocytopenia --Possibly due to acute illness, recent chemotherapy, or drug induced.  --check immature platelet fraction, tech smear  --monitor  Anemia --likely due to acute illness and recent chemotherapy.  --iron profile checked 1 month ago, no def --no def in vit b12 and folate --monitor Hgb   DVT prophylaxis: [id number] Code Status: Full code  Family Communication: mother updated at bedside today Level of care: Med-Surg Dispo:   The patient is from: home Anticipated d/c is to: home Anticipated d/c date is: 1-2 days   Subjective and Interval History:  Pt reported no BM yet.     Objective: Vitals:   01/02/23 2052 01/03/23 0500 01/03/23 0843 01/03/23 1618  BP: 122/77 100/68 117/74 111/72  Pulse:  82 90 100  Resp: 18 16 17 20   Temp: 99.2 F (37.3 C)  98 F (36.7 C) 97.6 F (36.4 C) 98.9 F (37.2 C)  TempSrc:    Oral  SpO2: 98% 98% 100% 99%  Weight:      Height:        Intake/Output Summary (Last 24 hours) at 01/03/2023 1912 Last data filed at 01/03/2023 1300 Gross per 24 hour  Intake 80 ml  Output --  Net 80 ml   Filed Weights   12/31/22 1048 12/31/22 2108  Weight: 64.3 kg 64.7 kg    Examination:   Constitutional: NAD, AAOx3 HEENT: conjunctivae and lids normal, EOMI CV: No cyanosis.   RESP: normal respiratory effort, on RA Neuro: II - XII grossly intact.   Psych: Normal mood and affect.  Appropriate judgement and reason   Data Reviewed: I have personally reviewed labs and imaging studies  Time spent: 35 minutes  Darlin Priestly, MD [facility] If 7PM-7AM, please contact night-coverage 01/03/2023, 7:12 PM

## 2023-01-03 NOTE — Progress Notes (Signed)
Diagnosis: Central line associated blood stream infection  Culture Result: mssa bcx; 11/21 bcx ngtd  Port removed 11/19 Tee negative  OPAT Orders Discharge antibiotics to be given via PICC line Discharge antibiotics: Cefazolin 2 gram iv q8hr x2 weeks until 12/04, then start linezolid 600 mg po bid 2 weeks until 12/18  Duration: 2 weeks for cefazolin  End Date: 12/04  [facility] Care Per Protocol:  Home health RN for IV administration and teaching; PICC line care and labs.    Labs weekly while on IV antibiotics: x__ CBC with differential __ BMP x__ CMP x__ CRP __ ESR __ Vancomycin trough __ CK  x__ Please pull PIC at completion of IV antibiotics __ Please leave PIC in place until doctor has seen patient or been notified  Fax weekly labs to [phone]  Clinic Follow Up Appt: 12/12 @ 1015 am  @  [facility] [address], [place], [place] [address] Phone: [phone]

## 2023-01-03 NOTE — Progress Notes (Signed)
Pharmacy - Meds to [facility]  Patient to discharge on IV cefazolin until 12/4 per ID then received linezolid 600mg  po BID x 2 additional weeks for total 4 weeks of antibiotics.   Linezolid given to patient with instructions to start on 12/5 (after complete antibiotics on 12/4).   Reviewed side effects and possible interactions. Patient did not have any further questions.   Juliette Alcide, PharmD, BCPS, BCIDP Work Cell: [phone] 01/03/2023 2:48 PM

## 2023-01-03 NOTE — Progress Notes (Signed)
Pharmacy Antibiotic Note  Penny Gomez is a 37 y.o. female admitted on 12/31/2022 with MSSA bacteremia.  Pharmacy has been consulted for Cefazolin dosing.  Patient with breast cancer undergoing chemotherapy (pembrolizumab, carboplatin and paclitaxel) via port.  Presented to cancer center yesterday for chemotherapy but did not feel well.  Blood cultures were collected and came back with MSSA per [facility].  She was given prescription for amoxicillin/clavulanate but did not start taking it yet.  Her port per surgery notes dehisced with a 7mm opening and has been draining clear fluid x 1 month.  Cephalexin was prescribed 10/28 x 7 days. Patient notes fever and chills over last 24hr  Today, 01/03/2023 Day #3 cefazolin Renal: SCr WNL WBC to WNL Afebrile 11/18 Bcx 4/4 GPC, [facility] detects MSSA 11/19 Bcx: MSSA 11/21 Bcx: NGTD Port removed 11/19 TEE - no vegetation 11/21  Plan: Continue Cefazolin 2gm IV q8h  Monitor blood culture results, temp, and renal function See OPAT orders ID following  Height: 5' (152.4 cm) Weight: 64.7 kg (142 lb 10.2 oz) IBW/kg (Calculated) : 45.5  Temp (24hrs), Avg:98.2 F (36.8 C), Min:97.6 F (36.4 C), Max:99.2 F (37.3 C)  Recent Labs  Lab 12/30/22 0828 12/31/22 1117 12/31/22 1924 01/01/23 0346 01/03/23 0618  WBC 10.4 19.6*  --  9.1  9.0 8.4  CREATININE 0.85 0.77  --  0.71 0.60  LATICACIDVEN  --  1.3 0.9  --   --     Estimated Creatinine Clearance: 81.6 mL/min (by C-G formula based on SCr of 0.6 mg/dL).    No Known Allergies  Antimicrobials this admission: 11/19 cefazolin >>  Dose adjustments this admission:   Microbiology results: 11/18 BCx: MSSA  11/19 Bcx: MSSA 11/21 Bcx NGTD 11/18 UCx: <10K    Thank you for allowing pharmacy to be a part of this patient's care.  Juliette Alcide, PharmD, BCPS, BCIDP Work Cell: [phone] 01/03/2023 9:56 AM

## 2023-01-04 DIAGNOSIS — R7881 Bacteremia: Secondary | ICD-10-CM | POA: Diagnosis not present

## 2023-01-04 NOTE — TOC Initial Note (Signed)
Transition of Care [facility]) - Initial/Assessment Note    Patient Details  Name: Penny Gomez MRN: [id number] Date of Birth: 1985-12-08  [facility] of [facility]) CM/SW Contact:    Rodney Langton, RN Phone Number: 01/04/2023, 10:56 AM  Clinical Narrative:                  Patient admitted from home, made aware that the plan is to discharge tomorrow with IV antibiotics. Per MD, still waiting PICC line to be placed and for final set of blood cultures to result. Pam with [facility] is aware.     Expected Discharge Plan: Home w Home Health Services Barriers to Discharge: Continued Medical Work up   Patient Goals and CMS Choice Patient states their goals for this hospitalization and ongoing recovery are:: Home with IV antibiotics and home nurse          Expected Discharge Plan and Services     Post Acute Care Choice: Home Health                             HH Arranged: IV Antibiotics HH Agency: [facility] Date HH Agency Contacted: 01/04/23 Time HH Agency Contacted: 1055 Representative spoke with at [facility] Agency: Pam  Prior Living Arrangements/Services                       Activities of Daily Living   ADL Screening (condition at time of admission) Independently performs ADLs?: Yes (appropriate for developmental age) Is the patient deaf or have difficulty hearing?: No Does the patient have difficulty seeing, even when wearing glasses/contacts?: No Does the patient have difficulty concentrating, remembering, or making decisions?: No  Permission Sought/Granted   Permission granted to share information with : Yes, Verbal Permission Granted              Emotional Assessment              Admission diagnosis:  Bacteremia [R78.81] Bloodstream infection due to Port-A-Cath, initial encounter [T80.211A] Sepsis (HCC) [A41.9] Acute pulmonary embolism without acute cor pulmonale, unspecified pulmonary embolism type (HCC) [I26.99] Sepsis, due to unspecified  organism, unspecified whether acute organ dysfunction present [facility]) [A41.9] 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 [place] [address] Phone: [phone] Fax: [phone]  [facility] - [facility] [address] [place] [address] Phone: [phone] Fax: [phone]     Social Determinants of Health (SDOH) Social History: SDOH Screenings   Food Insecurity: No Food Insecurity (12/31/2022)  Housing: Low Risk  (  12/31/2022)  Transportation Needs: No Transportation Needs (12/31/2022)  Utilities: Not At Risk (12/31/2022)  Depression (PHQ2-9): Low Risk  (10/10/2022)  Financial Resource Strain: Low Risk  (10/10/2022)  Stress: No Stress Concern Present (10/10/2022)  Tobacco Use: Low Risk  (12/31/2022)  Health Literacy: Adequate Health Literacy (10/10/2022)   SDOH Interventions:     Readmission Risk Interventions     No data to display

## 2023-01-04 NOTE — Progress Notes (Signed)
  PROGRESS NOTE    Penny Gomez  KGM:[id number] DOB: 1985-04-22 DOA: 12/31/2022 PCP: Shane Crutch, PA  127A/127A-AA  LOS: 4 days   Brief hospital course:   Assessment & Plan: Penny Gomez is a 37 y.o. female with medical history significant of left breast cancer being followed by Dr. Cathie Hoops outpatient, pulmonary embolism on Eliquis, presenting with sepsis, Staph aureus bacteremia, infected port.     * MSSA Bacteremia 2/2  Port infection Noted evaluation 11/18 w/ oncology for febrile illness  Blood cultures growing Staph aureus  Case discussed w/ ID Dr. Renold Don  S/p port removal --TEE neg for vegetation Plan: --cont IV cefazolin --at discharge, Cefazolin 2 gram iv q8hr x2 weeks until 12/04, then start linezolid 600 mg po bid 2 weeks until 12/18. --need blood cx neg 3 days and no fever 2 days before discharge.  Hx of pulmonary embolism (HCC) Formally diagnosed on 10/14  --cont Eliquis  Sepsis (HCC) --fever, tachycardia, bacteremia  Invasive carcinoma of breast (HCC) Followed by Dr. Cathie Hoops outpt  Currently on carbo/taxol/Keytruda  --Onc consulted with Dr. Cathie Hoops --rec waiting 4 weeks prior to port replacement  Thrombocytopenia --Possibly due to acute illness, recent chemotherapy, or drug induced.  --check immature platelet fraction, tech smear  --monitor  Anemia --likely due to acute illness and recent chemotherapy.  --iron profile checked 1 month ago, no def --no def in vit b12 and folate --monitor Hgb   DVT prophylaxis: [id number] Code Status: Full code  Family Communication:  Level of care: Med-Surg Dispo:   The patient is from: home Anticipated d/c is to: home Anticipated d/c date is: tomorrow   Subjective and Interval History:  Has been fever-free.  Pt had BM.   Objective: Vitals:   01/04/23 0605 01/04/23 0731 01/04/23 1614 01/04/23 1821  BP: 107/70 108/73 107/64 94/75  Pulse: 95 91 93 98  Resp: 18 16 16 14   Temp: 98.1 F (36.7 C) 98 F (36.7 C) 98.5 F  (36.9 C) 98.8 F (37.1 C)  TempSrc: Oral Oral Oral   SpO2: 100% 100% 100% 100%  Weight:      Height:       No intake or output data in the 24 hours ending 01/04/23 1905  Filed Weights   12/31/22 1048 12/31/22 2108  Weight: 64.3 kg 64.7 kg    Examination:   Constitutional: NAD, AAOx3 HEENT: conjunctivae and lids normal, EOMI CV: No cyanosis.   RESP: normal respiratory effort, on RA Neuro: II - XII grossly intact.   Psych: Normal mood and affect.  Appropriate judgement and reason   Data Reviewed: I have personally reviewed labs and imaging studies  Time spent: 35 minutes  Darlin Priestly, MD [facility] If 7PM-7AM, please contact night-coverage 01/04/2023, 7:05 PM

## 2023-01-05 ENCOUNTER — Other Ambulatory Visit: Payer: Self-pay

## 2023-01-05 ENCOUNTER — Encounter: Payer: Self-pay | Admitting: Oncology

## 2023-01-05 DIAGNOSIS — R7881 Bacteremia: Secondary | ICD-10-CM | POA: Diagnosis not present

## 2023-01-05 LAB — CBC
HCT: 28.2 % — ABNORMAL LOW (ref 36.0–46.0)
Hemoglobin: 9.2 g/dL — ABNORMAL LOW (ref 12.0–15.0)
MCH: 28.9 pg (ref 26.0–34.0)
MCHC: 32.6 g/dL (ref 30.0–36.0)
MCV: 88.7 fL (ref 80.0–100.0)
Platelets: 142 10*3/uL — ABNORMAL LOW (ref 150–400)
RBC: 3.18 MIL/uL — ABNORMAL LOW (ref 3.87–5.11)
RDW: 26.2 % — ABNORMAL HIGH (ref 11.5–15.5)
WBC: 7.9 10*3/uL (ref 4.0–10.5)
nRBC: 0 % (ref 0.0–0.2)

## 2023-01-05 LAB — MAGNESIUM: Magnesium: 2.3 mg/dL (ref 1.7–2.4)

## 2023-01-05 LAB — BASIC METABOLIC PANEL
Anion gap: 7 (ref 5–15)
BUN: 9 mg/dL (ref 6–20)
CO2: 27 mmol/L (ref 22–32)
Calcium: 9 mg/dL (ref 8.9–10.3)
Chloride: 103 mmol/L (ref 98–111)
Creatinine, Ser: 0.55 mg/dL (ref 0.44–1.00)
GFR, Estimated: >60 mL/min (ref >60)
Glucose, Bld: 108 mg/dL — ABNORMAL HIGH (ref 70–99)
Potassium: 4.4 mmol/L (ref 3.5–5.1)
Sodium: 137 mmol/L (ref 135–145)

## 2023-01-05 MED ORDER — SODIUM CHLORIDE 0.9% FLUSH: 10.0000 mL | Freq: Two times a day (BID) | INTRAVENOUS | Status: DC

## 2023-01-05 MED ORDER — SODIUM CHLORIDE 0.9% FLUSH: 10.0000 mL | INTRAVENOUS | Status: DC | PRN

## 2023-01-05 MED ORDER — CEFAZOLIN IV (FOR PTA / DISCHARGE USE ONLY): 2.0000 g | Freq: Three times a day (TID) | INTRAVENOUS | 0 refills | Status: AC

## 2023-01-05 MED ORDER — SIMETHICONE 80 MG PO CHEW
80.0000 mg | CHEWABLE_TABLET | Freq: Four times a day (QID) | ORAL | 2 refills | Status: DC | PRN
  Filled 2023-01-05: qty 30, 8d supply, fill #0

## 2023-01-05 NOTE — Discharge Summary (Incomplete)
Physician Discharge Summary   Penny Gomez  female DOB: [birth year]  YQM:[id number]  PCP: Shane Crutch, PA  Admit date: 12/31/2022 Discharge date: 01/05/2023  Admitted From: home Disposition:  home Home Health: Yes CODE STATUS: Full code  Discharge Instructions     [facility] pharmacist to adjust dose for Vancomycin, Aminoglycosides and other anti-infective therapies as requested by physician.   Complete by: As directed    [facility] to provide Cath Flo 2mg    Complete by: As directed    Administer for PICC line occlusion and as ordered by physician for other access device issues.   Anaphylaxis Kit: Provided to treat any anaphylactic reaction to the medication being provided to the patient if First Dose or when requested by physician   Complete by: As directed    Epinephrine 1mg /ml vial / amp: Administer 0.3mg  (0.71ml) subcutaneously once for moderate to severe anaphylaxis, nurse to call physician and pharmacy when reaction occurs and call 911 if needed for immediate care   Diphenhydramine 50mg /ml IV vial: Administer 25-50mg  IV/IM PRN for first dose reaction, rash, itching, mild reaction, nurse to call physician and pharmacy when reaction occurs   Sodium Chloride 0.9% NS IV: Administer if needed for hypovolemic blood pressure drop or as ordered by physician after call to physician with anaphylactic reaction   Change dressing on IV access line weekly and PRN   Complete by: As directed    Flush IV access with Sodium Chloride 0.9% and Heparin 10 units/ml or 100 units/ml   Complete by: As directed    Home infusion instructions - [facility]   Complete by: As directed    Instructions: Flush IV access with Sodium Chloride 0.9% and Heparin 10units/ml or 100units/ml   Change dressing on IV access line: Weekly and PRN   Instructions Cath Flo 2mg : Administer for PICC Line occlusion and as ordered by physician for other access device   [facility] pharmacist to adjust dose for: Vancomycin, Aminoglycosides and other anti-infective therapies as requested by physician   Method of administration may be changed at the discretion of home infusion pharmacist based upon assessment of the patient and/or caregiver's ability to self-administer the medication ordered   Complete by: As directed    No wound care   Complete by: As directed       Hospital Course:  For full details, please see H&P, progress notes, consult notes and ancillary notes.  Briefly,  Penny Gomez is a 37 y.o. female with medical history significant of left breast cancer being followed by Dr. Cathie Hoops outpatient, pulmonary embolism on Eliquis, presenting with sepsis, Staph aureus bacteremia, infected port.     * MSSA Bacteremia 2/2  Port infection Noted evaluation 11/18 w/ oncology for febrile illness  Blood cultures growing Staph aureus  Case discussed w/ ID Dr. Renold Don  S/p port removal --TEE neg for vegetation --Pt received IV cefazolin and discharged when repeat blood neg for 3 days (3rd set) and afebrile. --Pt had a midline placed prior to discharge (instead of PICC, per oncall ID rec).  After discharge, at home, pt will give herself Cefazolin 2 gram iv q8hr x2 weeks until 12/04, then start linezolid 600 mg po bid 2 weeks until 12/18.   Hx of pulmonary embolism (HCC) Formally diagnosed on 10/14  --cont Eliquis   Sepsis (HCC) --fever, tachycardia, bacteremia   Invasive carcinoma of breast (HCC) Followed by Dr. Cathie Hoops outpt  Currently on carbo/taxol/Keytruda  --Onc consulted  with Dr. Cathie Hoops --rec waiting 4 weeks prior to port replacement   Thrombocytopenia --Possibly due to acute illness, recent chemotherapy.  Plt improved to 142 prior to discharge.   Anemia --likely due to acute illness and recent chemotherapy.  --iron profile checked 1 month ago, no def.  Current workup showed no def in vit b12 and folate --Hgb stable around 9's   Unless noted above,  medications under "STOP" list are ones pt was not taking PTA.  Discharge Diagnoses:  Principal Problem:   Bacteremia Active Problems:   Acute pulmonary embolism (HCC)   Invasive carcinoma of breast (HCC)   Port-A-Cath in place   Sepsis (HCC)   Thrombocytopenia (HCC)   Anemia   Bloodstream infection due to Port-A-Cath   Endocarditis   30 Day Unplanned Readmission Risk Score    Flowsheet Row ED to Hosp-Admission (Current) from 12/31/2022 in [facility] 1C MEDICAL TELEMETRY  30 Day Unplanned Readmission Risk Score (%) 17.4 Filed at 01/05/2023 0801       This score is the patient's risk of an unplanned readmission within 30 days of being discharged (0 -100%). The score is based on dignosis, age, lab data, medications, orders, and past utilization.   Low:  0-14.9   Medium: 15-21.9   High: 22-29.9   Extreme: 30 and above         Discharge Instructions:  Allergies as of 01/05/2023   No Known Allergies      Medication List     STOP taking these medications    amoxicillin-clavulanate 875-125 MG tablet Commonly known as: AUGMENTIN   LORazepam 0.5 MG tablet Commonly known as: ATIVAN   ondansetron 8 MG tablet Commonly known as: ZOFRAN   potassium chloride SA 20 MEQ tablet Commonly known as: KLOR-CON M   prochlorperazine 10 MG tablet Commonly known as: COMPAZINE       TAKE these medications    acetaminophen 500 MG tablet Commonly known as: TYLENOL Take 2 tablets (1,000 mg total) by mouth every 6 (six) hours as needed for mild pain.   apixaban 5 MG Tabs tablet Commonly known as: ELIQUIS Take 1 tablet (5 mg total) by mouth 2 (two) times daily. Start AFTER completing your starter pack   Calcium 600 +D High Potency 600-10 MG-MCG Tabs Generic drug: Calcium Carbonate-Vitamin D Take 1 tablet by mouth 2 (two) times daily.   Calcium Carb-Cholecalciferol 600-10 MG-MCG Tabs Take 1 tablet by mouth 2 (two) times daily.   ceFAZolin  IVPB Commonly known as: ANCEF Inject 2 g into the vein every 8 (eight) hours for 11 days. Indication:  MSSA bacteremia First Dose: Yes Last Day of Therapy:  01/15/2023 Labs - Once weekly:  CBC/D, BMP, and CRP Please pull PIC/midline at completion of IV antibiotics Fax weekly labs to [phone] Method of administration: IV Push Method of administration may be changed at the discretion of home infusion pharmacist based upon assessment of the patient and/or caregiver's ability to self-administer the medication ordered.   lidocaine-prilocaine cream Commonly known as: EMLA Apply 1 Application topically as needed.   linezolid 600 MG tablet Commonly known as: ZYVOX Take 1 tablet (600 mg total) by mouth every 12 (twelve) hours for 14 days. Start taking on: January 16, 2023   senna 8.6 MG Tabs tablet Commonly known as: SENOKOT Take 2 tablets (17.2 mg total) by mouth daily.   simethicone 80 MG chewable tablet Commonly known as: MYLICON Chew 1 tablet (80 mg total) by mouth every 6 (six)  hours as needed for flatulence.               Discharge Care Instructions  (From admission, onward)           Start     Ordered   01/05/23 0000  Change dressing on IV access line weekly and PRN  (Home infusion instructions - [facility] )        01/05/23 0849             Follow-up Information     Henrene Dodge, MD Follow up in 2 week(s).   Specialty: General Surgery Why: Follow up around 1st week of December. For wound re-check Contact information: [address] [place] [address] [phone]         Shane Crutch, [place] Follow up in 1 week(s).   Specialty: Family Medicine Why: Hospital follow up Contact information: [address] [place] [address] [phone]         Raymondo Band, MD. Go on 01/23/2023.   Specialty: Infectious Diseases Why: Please arrive at 10am for your 1015 am appointment at  [facility] [address]  [place] [address], [place], [place] [address] Phone: [phone] Contact information: [address] [place] [address] [place] [address] [phone]                 No Known Allergies   The results of significant diagnostics from this hospitalization (including imaging, microbiology, ancillary and laboratory) are listed below for reference.   Consultations:   Procedures/Studies: [place] EKG SITE RITE  Result Date: 01/05/2023 If Site Rite image not attached, placement could not be confirmed due to current cardiac rhythm.  DG Chest 2 View  Result Date: 01/02/2023 CLINICAL DATA:  Fever and congestion for 1 week EXAM: CHEST - 2 VIEW COMPARISON:  11/24/2022 FINDINGS: Cardiac shadow is within normal limits. Right chest wall port is noted with catheter tip in the proximal superior vena cava. No pneumothorax is noted. Lungs are clear. No bony abnormality is noted. IMPRESSION: No acute abnormality seen. Electronically Signed   By: Alcide Clever M.D.   On: 01/02/2023 22:58   DG Chest Port 1 View  Result Date: 01/02/2023 CLINICAL DATA:  Left-sided chest and back pain, initial encounter EXAM: PORTABLE CHEST 1 VIEW COMPARISON:  12/30/2022 FINDINGS: Cardiac shadow is within normal limits. The lungs are hypoinflated. No focal infiltrate is seen. Scattered large and small bowel gas is noted. No bony abnormality is seen. Right chest wall port has been removed. IMPRESSION: No acute abnormality noted. Electronically Signed   By: Alcide Clever M.D.   On: 01/02/2023 22:58   ECHO TEE  Result Date: 01/02/2023    TRANSESOPHOGEAL ECHO REPORT   Patient Name:   Penny Gomez Date of Exam: 01/02/2023 Medical Rec #:  [id number]       Height:       60.0 in Accession #:    [id number]      Weight:       142.6 lb Date of Birth:  Jan 21, 1986        BSA:          1.617 m Patient Age:    37 years        BP:           116/75 mmHg Patient Gender: F               HR:           106  bpm. Exam Location:  [facility] Procedure: Transesophageal  Echo, Cardiac Doppler, Color Doppler and Saline            Contrast Bubble Study Indications:     Bacteremia R78.81  History:         Patient has prior history of Echocardiogram examinations, most                  recent 12/31/2022. Breast cancer.  Sonographer:     Cristela Blue Referring Phys:  [id number] CHRISTOPHER RONALD BERGE Diagnosing Phys: Julien Nordmann MD PROCEDURE: After discussion of the risks and benefits of a TEE, an informed consent was obtained from the patient. TEE procedure time was 30 minutes. The transesophogeal probe was passed without difficulty through the esophogus of the patient. Imaged were obtained with the patient in a left lateral decubitus position. Local oropharyngeal anesthetic was provided with Cetacaine and viscous lidocaine. Sedation performed by different physician. Image quality was excellent. The patient's vital signs; including heart rate, blood pressure, and oxygen saturation; remained stable throughout the procedure. The patient developed no complications during the procedure.  IMPRESSIONS  1. No valve vegetation noted  2. Left ventricular ejection fraction, by estimation, is 60 to 65%. The left ventricle has normal function. The left ventricle has no regional wall motion abnormalities.  3. Right ventricular systolic function is normal. The right ventricular size is normal.  4. No left atrial/left atrial appendage thrombus was detected.  5. The mitral valve is normal in structure. No evidence of mitral valve regurgitation. No evidence of mitral stenosis.  6. The aortic valve is normal in structure. Aortic valve regurgitation is not visualized. No aortic stenosis is present.  7. The inferior vena cava is normal in size with greater than 50% respiratory variability, suggesting right atrial pressure of 3 mmHg.  8. Agitated saline contrast bubble study was negative, with no evidence of any interatrial shunt. Conclusion(s)/Recommendation(s): Normal biventricular function without evidence  of hemodynamically significant valvular heart disease. FINDINGS  Left Ventricle: Left ventricular ejection fraction, by estimation, is 60 to 65%. The left ventricle has normal function. The left ventricle has no regional wall motion abnormalities. The left ventricular internal cavity size was normal in size. There is  no left ventricular hypertrophy. Right Ventricle: The right ventricular size is normal. No increase in right ventricular wall thickness. Right ventricular systolic function is normal. Left Atrium: Left atrial size was normal in size. No left atrial/left atrial appendage thrombus was detected. Right Atrium: Right atrial size was normal in size. Pericardium: There is no evidence of pericardial effusion. Mitral Valve: The mitral valve is normal in structure. No evidence of mitral valve regurgitation. No evidence of mitral valve stenosis. There is no evidence of mitral valve vegetation. Tricuspid Valve: The tricuspid valve is normal in structure. Tricuspid valve regurgitation is not demonstrated. No evidence of tricuspid stenosis. There is no evidence of tricuspid valve vegetation. Aortic Valve: The aortic valve is normal in structure. Aortic valve regurgitation is not visualized. No aortic stenosis is present. There is no evidence of aortic valve vegetation. Pulmonic Valve: The pulmonic valve was normal in structure. Pulmonic valve regurgitation is not visualized. No evidence of pulmonic stenosis. There is no evidence of pulmonic valve vegetation. Aorta: The aortic root is normal in size and structure. Venous: The inferior vena cava is normal in size with greater than 50% respiratory variability, suggesting right atrial pressure of 3 mmHg. IAS/Shunts: No atrial level shunt detected by color flow Doppler. Agitated saline contrast was  given intravenously to evaluate for intracardiac shunting. Agitated saline contrast bubble study was negative, with no evidence of any interatrial shunt. There  is no evidence  of a patent foramen ovale. There is no evidence of an atrial septal defect. Julien Nordmann MD Electronically signed by Julien Nordmann MD Signature Date/Time: 01/02/2023/4:40:37 PM    Final    ECHOCARDIOGRAM LIMITED  Result Date: 01/01/2023    ECHOCARDIOGRAM LIMITED REPORT   Patient Name:   Penny Gomez Date of Exam: 12/31/2022 Medical Rec #:  [id number]       Height:       60.0 in Accession #:    [id number]      Weight:       141.8 lb Date of Birth:  07-29-1985        BSA:          1.613 m Patient Age:    37 years        BP:           122/63 mmHg Patient Gender: F               HR:           113 bpm. Exam Location:  [facility] Procedure: 2D Echo, Limited Echo, Limited Color Doppler, 3D Echo and Strain            Analysis Indications:     R78.81 Bacteremia.  History:         Patient has prior history of Echocardiogram examinations, most                  recent 10/17/2022. Breast cancer.  Sonographer:     Daphine Deutscher RDCS Referring Phys:  [id number] LKGMW T VU Diagnosing Phys: Julien Nordmann MD IMPRESSIONS  1. Left ventricular ejection fraction, by estimation, is 60 to 65%. The left ventricle has normal function. The left ventricle has no regional wall motion abnormalities. Left ventricular diastolic parameters were normal.  2. Right ventricular systolic function is normal. The right ventricular size is normal. Tricuspid regurgitation signal is inadequate for assessing PA pressure.  3. The mitral valve is normal in structure. Mild mitral valve regurgitation. No evidence of mitral stenosis.  4. The aortic valve is normal in structure. Aortic valve regurgitation is not visualized. No aortic stenosis is present.  5. The inferior vena cava is normal in size with greater than 50% respiratory variability, suggesting right atrial pressure of 3 mmHg. FINDINGS  Left Ventricle: Left ventricular ejection fraction, by estimation, is 60 to 65%. The left ventricle has normal function. The left ventricle has no regional wall  motion abnormalities. The left ventricular internal cavity size was normal in size. There is  no left ventricular hypertrophy. Left ventricular diastolic parameters were normal. Right Ventricle: The right ventricular size is normal. No increase in right ventricular wall thickness. Right ventricular systolic function is normal. Tricuspid regurgitation signal is inadequate for assessing PA pressure. Left Atrium: Left atrial size was normal in size. Right Atrium: Right atrial size was normal in size. Pericardium: There is no evidence of pericardial effusion. Mitral Valve: The mitral valve is normal in structure. Mild mitral valve regurgitation. No evidence of mitral valve stenosis. Tricuspid Valve: The tricuspid valve is normal in structure. Tricuspid valve regurgitation is not demonstrated. No evidence of tricuspid stenosis. Aortic Valve: The aortic valve is normal in structure. Aortic valve regurgitation is not visualized. No aortic stenosis is present. Pulmonic Valve: The pulmonic valve was normal in structure. Pulmonic valve regurgitation  is not visualized. No evidence of pulmonic stenosis. Aorta: The aortic root is normal in size and structure. Venous: The inferior vena cava is normal in size with greater than 50% respiratory variability, suggesting right atrial pressure of 3 mmHg. IAS/Shunts: No atrial level shunt detected by color flow Doppler. LEFT VENTRICLE PLAX 2D LVIDd:         4.20 cm   Diastology LVIDs:         2.20 cm   LV e' medial:    12.23 cm/s LV PW:         0.90 cm   LV E/e' medial:  8.3 LV IVS:        0.90 cm   LV e' lateral:   15.35 cm/s LVOT diam:     2.10 cm   LV E/e' lateral: 6.6 LV SV:         64 LV SV Index:   40        2D Longitudinal Strain LVOT Area:     3.46 cm  2D Strain GLS (A2C):   -24.5 %                          2D Strain GLS (A3C):   -24.0 %                          2D Strain GLS (A4C):   -24.5 %                          2D Strain GLS Avg:     -24.3 %                           3D  Volume EF:                          3D EF:        56 %                          LV EDV:       111 ml                          LV ESV:       48 ml                          LV SV:        63 ml RIGHT VENTRICLE RV Basal diam:  3.70 cm RV S prime:     20.90 cm/s TAPSE (M-mode): 2.8 cm LEFT ATRIUM             Index        RIGHT ATRIUM           Index LA diam:        4.20 cm 2.60 cm/m   RA Area:     15.10 cm LA Vol (A2C):   18.2 ml 11.29 ml/m  RA Volume:   43.30 ml  26.85 ml/m LA Vol (A4C):   23.7 ml 14.70 ml/m LA Biplane Vol: 21.3 ml 13.21 ml/m  AORTIC VALVE LVOT Vmax:   129.33 cm/s LVOT Vmean:  86.800 cm/s LVOT VTI:    0.185 m  AORTA Ao Root diam: 2.90  cm Ao Asc diam:  2.80 cm MITRAL VALVE MV Area (PHT): 5.66 cm     SHUNTS MV Decel Time: 134 msec     Systemic VTI:  0.18 m MV E velocity: 101.50 cm/s  Systemic Diam: 2.10 cm MV A velocity: 97.80 cm/s MV E/A ratio:  1.04 Julien Nordmann MD Electronically signed by Julien Nordmann MD Signature Date/Time: 01/01/2023/3:27:39 PM    Final    CT Angio Chest Pulmonary Embolism (PE) W or WO Contrast  Result Date: 12/31/2022 CLINICAL DATA:  Elevated liver function tests. Fever. Abdominal pain. Positive blood culture. * Tracking Code: BO * breast cancer. EXAM: CT ANGIOGRAPHY CHEST CT ABDOMEN AND PELVIS WITH CONTRAST TECHNIQUE: Multidetector CT imaging of the chest was performed using the standard protocol during bolus administration of intravenous contrast. Multiplanar CT image reconstructions and MIPs were obtained to evaluate the vascular anatomy. Multidetector CT imaging of the abdomen and pelvis was performed using the standard protocol during bolus administration of intravenous contrast. RADIATION DOSE REDUCTION: This exam was performed according to the departmental dose-optimization program which includes automated exposure control, adjustment of the mA and/or kV according to patient size and/or use of iterative reconstruction technique. CONTRAST:  75mL OMNIPAQUE  IOHEXOL 350 MG/ML SOLN COMPARISON:  03/20/2020 abdominopelvic CT. FINDINGS: CTA CHEST FINDINGS Cardiovascular: The quality of this exam for evaluation of pulmonary embolism is poor to moderate. The bolus is suboptimally timed, centered in the SVC. In addition, there is minimal motion. No central or large lobar pulmonary embolism. Possible small volume pulmonary emboli to the right lower lobe, including within segmental branches on 140/5. Right Port-A-Cath tip high right atrium. subtle soft tissue fullness at the Port-A-Cath insertion site in the neck including on 08/04 is likely related to recent port placement. Normal aortic caliber. Mild cardiomegaly, without pericardial effusion. Mediastinum/Nodes: No axillary or subpectoral adenopathy. No mediastinal or hilar adenopathy. Distal esophageal wall thickening is mild-to-moderate on 81/4. No internal mammary adenopathy. Lungs/Pleura: No pleural fluid. Minimal motion degradation in the lower chest. Dependent right lower lobe airspace and ground-glass opacity. 2 mm subpleural right upper lobe pulmonary nodule on 34/6, nonspecific. Right middle lobe pulmonary nodules x2 including up to maximally 3 mm on 71/6 were present on 03/20/2020 and considered benign. Musculoskeletal: No acute osseous abnormality. Review of the MIP images confirms the above findings. CT ABDOMEN and PELVIS FINDINGS Hepatobiliary: Mild limitations secondary to patient arm position, EKG wires and lead artifacts. Hepatomegaly at 21.4 cm. 6 mm subcapsular right hepatic lobe lesion is not readily apparent on the prior. A central left hepatic lobe 4 mm hypoattenuating lesion is present on the prior and likely a cyst. Normal gallbladder, without biliary ductal dilatation. Pancreas: Normal, without mass or ductal dilatation. Spleen: Normal in size, without focal abnormality. Adrenals/Urinary Tract: Normal adrenal glands. Minimal bilateral caliectasis without significant hydroureter. The bladder is mildly  distended, likely causative. Appearance is relatively similar to 03/20/2020. Stomach/Bowel: Normal stomach, without wall thickening. Normal large and small bowel loops. Vascular/Lymphatic: Normal caliber of the aorta and branch vessels. No abdominopelvic adenopathy. Reproductive: Fibroid uterus again identified. Uterine masses up to 3.4 cm. Tubular right adnexal cystic structure is again favored to represent hydrosalpinx. Relatively similar at up to 2.5 cm on 79/11. Other: Trace free pelvic fluid is likely physiologic. No abdominal ascites or free intraperitoneal air. Musculoskeletal: No acute osseous abnormality. Review of the MIP images confirms the above findings. IMPRESSION: CT CHEST IMPRESSION 1. Poor to moderate quality evaluation for pulmonary embolism. Possible small right lower lobe segmental  to subsegmental pulmonary emboli. Of questionable clinical significance, given volume. 2. Right lower lobe airspace and ground-glass opacity, favoring pneumonia. If the patient does not have infectious symptoms, infarct is a secondary consideration. 3. No evidence of metastatic disease in the chest 4. Distal esophageal wall thickening, suspicious for esophagitis. CT ABDOMEN AND PELVIS IMPRESSION 1. Multifactorial degraded evaluation of the abdomen and pelvis. 2. 6 mm right hepatic lobe lesion is too small to characterize, felt to be new since the prior exam. Favored to represent a cyst but technically indeterminate. Potential clinical strategies include follow-up on routine screening CTs versus further evaluation with nonemergent outpatient pre and post-contrast MRI. 3. Otherwise, no evidence of metastatic disease in the abdomen or pelvis. 4. Minimal bilateral caliectasis, similar and likely related to bladder distention. 5. Fibroid uterus 6. Right-sided hydrosalpinx, similar. Electronically Signed   By: Jeronimo Greaves M.D.   On: 12/31/2022 15:07   CT ABDOMEN PELVIS W CONTRAST  Result Date: 12/31/2022 CLINICAL DATA:   Elevated liver function tests. Fever. Abdominal pain. Positive blood culture. * Tracking Code: BO * breast cancer. EXAM: CT ANGIOGRAPHY CHEST CT ABDOMEN AND PELVIS WITH CONTRAST TECHNIQUE: Multidetector CT imaging of the chest was performed using the standard protocol during bolus administration of intravenous contrast. Multiplanar CT image reconstructions and MIPs were obtained to evaluate the vascular anatomy. Multidetector CT imaging of the abdomen and pelvis was performed using the standard protocol during bolus administration of intravenous contrast. RADIATION DOSE REDUCTION: This exam was performed according to the departmental dose-optimization program which includes automated exposure control, adjustment of the mA and/or kV according to patient size and/or use of iterative reconstruction technique. CONTRAST:  75mL OMNIPAQUE IOHEXOL 350 MG/ML SOLN COMPARISON:  03/20/2020 abdominopelvic CT. FINDINGS: CTA CHEST FINDINGS Cardiovascular: The quality of this exam for evaluation of pulmonary embolism is poor to moderate. The bolus is suboptimally timed, centered in the SVC. In addition, there is minimal motion. No central or large lobar pulmonary embolism. Possible small volume pulmonary emboli to the right lower lobe, including within segmental branches on 140/5. Right Port-A-Cath tip high right atrium. subtle soft tissue fullness at the Port-A-Cath insertion site in the neck including on 08/04 is likely related to recent port placement. Normal aortic caliber. Mild cardiomegaly, without pericardial effusion. Mediastinum/Nodes: No axillary or subpectoral adenopathy. No mediastinal or hilar adenopathy. Distal esophageal wall thickening is mild-to-moderate on 81/4. No internal mammary adenopathy. Lungs/Pleura: No pleural fluid. Minimal motion degradation in the lower chest. Dependent right lower lobe airspace and ground-glass opacity. 2 mm subpleural right upper lobe pulmonary nodule on 34/6, nonspecific. Right  middle lobe pulmonary nodules x2 including up to maximally 3 mm on 71/6 were present on 03/20/2020 and considered benign. Musculoskeletal: No acute osseous abnormality. Review of the MIP images confirms the above findings. CT ABDOMEN and PELVIS FINDINGS Hepatobiliary: Mild limitations secondary to patient arm position, EKG wires and lead artifacts. Hepatomegaly at 21.4 cm. 6 mm subcapsular right hepatic lobe lesion is not readily apparent on the prior. A central left hepatic lobe 4 mm hypoattenuating lesion is present on the prior and likely a cyst. Normal gallbladder, without biliary ductal dilatation. Pancreas: Normal, without mass or ductal dilatation. Spleen: Normal in size, without focal abnormality. Adrenals/Urinary Tract: Normal adrenal glands. Minimal bilateral caliectasis without significant hydroureter. The bladder is mildly distended, likely causative. Appearance is relatively similar to 03/20/2020. Stomach/Bowel: Normal stomach, without wall thickening. Normal large and small bowel loops. Vascular/Lymphatic: Normal caliber of the aorta and branch vessels. No abdominopelvic adenopathy.  Reproductive: Fibroid uterus again identified. Uterine masses up to 3.4 cm. Tubular right adnexal cystic structure is again favored to represent hydrosalpinx. Relatively similar at up to 2.5 cm on 79/11. Other: Trace free pelvic fluid is likely physiologic. No abdominal ascites or free intraperitoneal air. Musculoskeletal: No acute osseous abnormality. Review of the MIP images confirms the above findings. IMPRESSION: CT CHEST IMPRESSION 1. Poor to moderate quality evaluation for pulmonary embolism. Possible small right lower lobe segmental to subsegmental pulmonary emboli. Of questionable clinical significance, given volume. 2. Right lower lobe airspace and ground-glass opacity, favoring pneumonia. If the patient does not have infectious symptoms, infarct is a secondary consideration. 3. No evidence of metastatic disease in  the chest 4. Distal esophageal wall thickening, suspicious for esophagitis. CT ABDOMEN AND PELVIS IMPRESSION 1. Multifactorial degraded evaluation of the abdomen and pelvis. 2. 6 mm right hepatic lobe lesion is too small to characterize, felt to be new since the prior exam. Favored to represent a cyst but technically indeterminate. Potential clinical strategies include follow-up on routine screening CTs versus further evaluation with nonemergent outpatient pre and post-contrast MRI. 3. Otherwise, no evidence of metastatic disease in the abdomen or pelvis. 4. Minimal bilateral caliectasis, similar and likely related to bladder distention. 5. Fibroid uterus 6. Right-sided hydrosalpinx, similar. Electronically Signed   By: Jeronimo Greaves M.D.   On: 12/31/2022 15:07      Labs: BNP (last 3 results) No results for input(s): "BNP" in the last 8760 hours. Basic Metabolic Panel: Recent Labs  Lab 12/30/22 0828 12/31/22 1117 01/01/23 0346 01/03/23 0618 01/05/23 0321  NA 135 131* 129* 137 137  K 3.6 3.4* 3.7 3.9 4.4  CL 101 97* 103 102 103  CO2 22 24 22 27 27   GLUCOSE 137* 124* 174* 111* 108*  BUN 9 9 9 8 9   CREATININE 0.85 0.77 0.71 0.60 0.55  CALCIUM 8.8* 9.2 8.0* 9.2 9.0  MG  --   --   --  2.4 2.3   Liver Function Tests: Recent Labs  Lab 12/30/22 0828 12/31/22 1117 01/01/23 0346  AST 32 60* 53*  ALT 33 68* 57*  ALKPHOS 115 106 74  BILITOT 0.3 1.2* 0.7  PROT 7.6 8.7* 6.6  ALBUMIN 3.9 4.3 3.2*   No results for input(s): "LIPASE", "AMYLASE" in the last 168 hours. No results for input(s): "AMMONIA" in the last 168 hours. CBC: Recent Labs  Lab 12/30/22 0828 12/31/22 1117 01/01/23 0346 01/03/23 0618 01/05/23 0321  WBC 10.4 19.6* 9.1  9.0 8.4 7.9  NEUTROABS 8.0* 17.8* 7.8*  --   --   HGB 11.6* 12.4 10.0*  10.0* 9.8* 9.2*  HCT 36.1 37.8 30.4*  29.7* 30.0* 28.2*  MCV 89.1 89.2 87.6  86.3 88.2 88.7  PLT 184 136* 72*  71* 86* 142*   Cardiac Enzymes: No results for input(s):  "CKTOTAL", "CKMB", "CKMBINDEX", "TROPONINI" in the last 168 hours. BNP: Invalid input(s): "POCBNP" CBG: Recent Labs  Lab 12/31/22 2104  GLUCAP 156*   D-Dimer No results for input(s): "DDIMER" in the last 72 hours. Hgb A1c Recent Labs    01/03/23 0618  HGBA1C 5.5   Lipid Profile No results for input(s): "CHOL", "HDL", "LDLCALC", "TRIG", "CHOLHDL", "LDLDIRECT" in the last 72 hours. Thyroid function studies No results for input(s): "TSH", "T4TOTAL", "T3FREE", "THYROIDAB" in the last 72 hours.  Invalid input(s): "FREET3" Anemia work up Recent Labs    01/03/23 0618  [id number] 1,252*   Urinalysis    Component Value Date/Time  COLORURINE YELLOW (A) 12/31/2022 1330   APPEARANCEUR CLEAR (A) 12/31/2022 1330   APPEARANCEUR Hazy 12/04/2011 1032   LABSPEC 1.019 12/31/2022 1330   LABSPEC 1.012 12/04/2011 1032   PHURINE 6.0 12/31/2022 1330   GLUCOSEU NEGATIVE 12/31/2022 1330   GLUCOSEU Negative 12/04/2011 1032   HGBUR MODERATE (A) 12/31/2022 1330   BILIRUBINUR NEGATIVE 12/31/2022 1330   BILIRUBINUR Negative 03/19/2022 0824   BILIRUBINUR Negative 12/04/2011 1032   KETONESUR 20 (A) 12/31/2022 1330   PROTEINUR 30 (A) 12/31/2022 1330   UROBILINOGEN 0.2 03/19/2022 0824   NITRITE NEGATIVE 12/31/2022 1330   LEUKOCYTESUR NEGATIVE 12/31/2022 1330   LEUKOCYTESUR Negative 12/04/2011 1032   Sepsis Labs Recent Labs  Lab 12/31/22 1117 01/01/23 0346 01/03/23 0618 01/05/23 0321  WBC 19.6* 9.1  9.0 8.4 7.9   Microbiology Recent Results (from the past 240 hour(s))  Respiratory (~20 pathogens) panel by PCR     Status: None   Collection Time: 12/30/22  9:40 AM   Specimen: Nasopharyngeal Swab; Respiratory  Result Value Ref Range Status   Adenovirus NOT DETECTED NOT DETECTED Final   Coronavirus 229E NOT DETECTED NOT DETECTED Final    Comment: (NOTE) The Coronavirus on the Respiratory Panel, DOES NOT test for the novel  Coronavirus (2019 nCoV)    Coronavirus HKU1 NOT DETECTED NOT  DETECTED Final   Coronavirus NL63 NOT DETECTED NOT DETECTED Final   Coronavirus OC43 NOT DETECTED NOT DETECTED Final   Metapneumovirus NOT DETECTED NOT DETECTED Final   Rhinovirus / Enterovirus NOT DETECTED NOT DETECTED Final   Influenza A NOT DETECTED NOT DETECTED Final   Influenza B NOT DETECTED NOT DETECTED Final   Parainfluenza Virus 1 NOT DETECTED NOT DETECTED Final   Parainfluenza Virus 2 NOT DETECTED NOT DETECTED Final   Parainfluenza Virus 3 NOT DETECTED NOT DETECTED Final   Parainfluenza Virus 4 NOT DETECTED NOT DETECTED Final   Respiratory Syncytial Virus NOT DETECTED NOT DETECTED Final   Bordetella pertussis NOT DETECTED NOT DETECTED Final   Bordetella Parapertussis NOT DETECTED NOT DETECTED Final   Chlamydophila pneumoniae NOT DETECTED NOT DETECTED Final   Mycoplasma pneumoniae NOT DETECTED NOT DETECTED Final    Comment: Performed at [facility], [address], [place], [place] [address]  Urine Culture     Status: Abnormal   Collection Time: 12/30/22  9:42 AM   Specimen: Urine, Clean Catch  Result Value Ref Range Status   Specimen Description   Final    URINE, CLEAN CATCH Performed at [facility], [address], [place], [place] [address]    Special Requests   Final    NONE Performed at [facility], [address] [place], [place] [address]    Culture (A)  Final    <10,000 COLONIES/mL INSIGNIFICANT GROWTH Performed at [facility], [address] [place], [place] [address]    Report Status 12/31/2022 FINAL  Final  Culture, blood (routine x 2)     Status: Abnormal   Collection Time: 12/30/22  9:56 AM   Specimen: BLOOD LEFT ARM  Result Value Ref Range Status   Specimen Description   Final    BLOOD LEFT ARM Performed at [facility], [address] [place], [place] [address]    Special Requests   Final    BOTTLES DRAWN AEROBIC AND ANAEROBIC Blood Culture adequate volume Performed at [facility], [address]  [place], [place] [address]    Culture  Setup Time   Final  GRAM POSITIVE COCCI IN BOTH AEROBIC AND ANAEROBIC BOTTLES CRITICAL VALUE NOTED.  VALUE IS CONSISTENT WITH PREVIOUSLY REPORTED AND CALLED VALUE. Performed at [facility], [address] [place], [place] [address]    Culture (A)  Final    STAPHYLOCOCCUS AUREUS SUSCEPTIBILITIES PERFORMED ON PREVIOUS CULTURE WITHIN THE LAST 5 DAYS. Performed at [facility], [address] [place], [place] [address]    Report Status 01/02/2023 FINAL  Final  Culture, blood (routine x 2)     Status: Abnormal   Collection Time: 12/30/22 10:04 AM   Specimen: BLOOD RIGHT ARM  Result Value Ref Range Status   Specimen Description   Final    BLOOD RIGHT ARM Performed at [facility], [address], [place], [place] [address]    Special Requests   Final    BOTTLES DRAWN AEROBIC AND ANAEROBIC Blood Culture adequate volume Performed at [facility], [address] [place], [place] [address]    Culture  Setup Time   Final    GRAM POSITIVE COCCI IN BOTH AEROBIC AND ANAEROBIC BOTTLES CRITICAL RESULT CALLED TO, READ BACK BY AND VERIFIED WITH: JASON ROBBINS@0555  12/31/22 RH Performed at [facility], [address] [place], [place] [address]    Culture STAPHYLOCOCCUS AUREUS (A)  Final   Report Status 01/02/2023 FINAL  Final   Organism ID, Bacteria STAPHYLOCOCCUS AUREUS  Final      Susceptibility   Staphylococcus aureus - MIC*    CIPROFLOXACIN <=0.5 SENSITIVE Sensitive     ERYTHROMYCIN >=8 RESISTANT Resistant     GENTAMICIN <=0.5 SENSITIVE Sensitive     OXACILLIN <=0.25 SENSITIVE Sensitive     TETRACYCLINE <=1 SENSITIVE Sensitive     VANCOMYCIN <=0.5 SENSITIVE Sensitive     TRIMETH/SULFA <=10 SENSITIVE Sensitive     CLINDAMYCIN RESISTANT Resistant     RIFAMPIN <=0.5 SENSITIVE Sensitive     Inducible Clindamycin POSITIVE Resistant     LINEZOLID 2 SENSITIVE Sensitive     * STAPHYLOCOCCUS AUREUS  Blood  Culture ID Panel (Reflexed)     Status: Abnormal (Preliminary result)   Collection Time: 12/30/22 10:04 AM  Result Value Ref Range Status   Enterococcus faecalis NOT DETECTED NOT DETECTED Final   Enterococcus Faecium NOT DETECTED NOT DETECTED Final   Listeria monocytogenes NOT DETECTED NOT DETECTED Final   Staphylococcus species DETECTED (A) NOT DETECTED Final    Comment: CRITICAL RESULT CALLED TO, READ BACK BY AND VERIFIED WITH: JASON ROBBINS@0555  12/31/22 RH    Staphylococcus aureus (BCID) DETECTED (A) NOT DETECTED Final    Comment: CRITICAL RESULT CALLED TO, READ BACK BY AND VERIFIED WITH: JASON ROBBINS@0555  12/31/22 RH    Staphylococcus epidermidis NOT DETECTED NOT DETECTED Final   Staphylococcus lugdunensis NOT DETECTED NOT DETECTED Final   Streptococcus species NOT DETECTED NOT DETECTED Final   Streptococcus agalactiae NOT DETECTED NOT DETECTED Final   Streptococcus pneumoniae NOT DETECTED NOT DETECTED Final   Streptococcus pyogenes NOT DETECTED NOT DETECTED Final   A.calcoaceticus-baumannii PENDING NOT DETECTED Incomplete   Bacteroides fragilis PENDING NOT DETECTED Incomplete   Enterobacterales PENDING NOT DETECTED Incomplete   Enterobacter cloacae complex PENDING NOT DETECTED Incomplete   Escherichia coli PENDING NOT DETECTED Incomplete   Klebsiella aerogenes PENDING NOT DETECTED Incomplete   Klebsiella oxytoca PENDING NOT DETECTED Incomplete   Klebsiella pneumoniae PENDING NOT DETECTED Incomplete   Proteus species PENDING NOT DETECTED Incomplete   Salmonella species PENDING NOT DETECTED Incomplete   Serratia marcescens PENDING NOT DETECTED Incomplete  Haemophilus influenzae PENDING NOT DETECTED Incomplete   Neisseria meningitidis PENDING NOT DETECTED Incomplete   Pseudomonas aeruginosa PENDING NOT DETECTED Incomplete   Stenotrophomonas maltophilia PENDING NOT DETECTED Incomplete   Candida albicans PENDING NOT DETECTED Incomplete   Candida auris PENDING NOT DETECTED  Incomplete   Candida glabrata PENDING NOT DETECTED Incomplete   Candida krusei PENDING NOT DETECTED Incomplete   Candida parapsilosis PENDING NOT DETECTED Incomplete   Candida tropicalis PENDING NOT DETECTED Incomplete   Cryptococcus neoformans/gattii PENDING NOT DETECTED Incomplete   Meth resistant mecA/C and MREJ NOT DETECTED NOT DETECTED Final    Comment: Performed at [facility], [address] [place], [place] [address]  Blood Culture (routine x 2)     Status: Abnormal   Collection Time: 12/31/22 11:00 AM   Specimen: BLOOD  Result Value Ref Range Status   Specimen Description   Final    BLOOD RIGHT ANTECUBITAL Performed at [facility], [address] [place], [place] [address]    Special Requests   Final    BOTTLES DRAWN AEROBIC AND ANAEROBIC Blood Culture results may not be optimal due to an excessive volume of blood received in culture bottles Performed at [facility], [address] [place], [place] [address]    Culture  Setup Time   Final    Organism ID to follow GRAM POSITIVE COCCI IN BOTH AEROBIC AND ANAEROBIC BOTTLES CRITICAL RESULT CALLED TO, READ BACK BY AND VERIFIED WITH: JASON ROBBINS PHARMD @0307  01/01/23 ASW GRAM STAIN REVIEWED-AGREE WITH RESULT ADC    Culture (A)  Final    STAPHYLOCOCCUS AUREUS SUSCEPTIBILITIES PERFORMED ON PREVIOUS CULTURE WITHIN THE LAST 5 DAYS. Performed at [facility], [address] [place], [place] [address]    Report Status 01/03/2023 FINAL  Final  Blood Culture ID Panel (Reflexed)     Status: Abnormal   Collection Time: 12/31/22 11:00 AM  Result Value Ref Range Status   Enterococcus faecalis NOT DETECTED NOT DETECTED Final   Enterococcus Faecium NOT DETECTED NOT DETECTED Final   Listeria monocytogenes NOT DETECTED NOT DETECTED Final   Staphylococcus species DETECTED (A) NOT DETECTED Final    Comment: CRITICAL RESULT CALLED TO, READ BACK BY AND VERIFIED WITH: JASON ROBBINS PHARMD @0307   01/01/23 ASW    Staphylococcus aureus (BCID) DETECTED (A) NOT DETECTED Final    Comment: CRITICAL RESULT CALLED TO, READ BACK BY AND VERIFIED WITH: JASON ROBBINS PHARMD @0307  01/01/23 ASW    Staphylococcus epidermidis NOT DETECTED NOT DETECTED Final   Staphylococcus lugdunensis NOT DETECTED NOT DETECTED Final   Streptococcus species NOT DETECTED NOT DETECTED Final   Streptococcus agalactiae NOT DETECTED NOT DETECTED Final   Streptococcus pneumoniae NOT DETECTED NOT DETECTED Final   Streptococcus pyogenes NOT DETECTED NOT DETECTED Final   A.calcoaceticus-baumannii NOT DETECTED NOT DETECTED Final   Bacteroides fragilis NOT DETECTED NOT DETECTED Final   Enterobacterales NOT DETECTED NOT DETECTED Final   Enterobacter cloacae complex NOT DETECTED NOT DETECTED Final   Escherichia coli NOT DETECTED NOT DETECTED Final   Klebsiella aerogenes NOT DETECTED NOT DETECTED Final   Klebsiella oxytoca NOT DETECTED NOT DETECTED Final   Klebsiella pneumoniae NOT DETECTED NOT DETECTED Final   Proteus species NOT DETECTED NOT DETECTED Final   Salmonella species NOT DETECTED NOT DETECTED Final   Serratia marcescens NOT DETECTED NOT DETECTED Final   Haemophilus influenzae NOT DETECTED NOT DETECTED Final   Neisseria meningitidis NOT DETECTED NOT DETECTED Final   Pseudomonas aeruginosa NOT DETECTED NOT DETECTED Final  Stenotrophomonas maltophilia NOT DETECTED NOT DETECTED Final   Candida albicans NOT DETECTED NOT DETECTED Final   Candida auris NOT DETECTED NOT DETECTED Final   Candida glabrata NOT DETECTED NOT DETECTED Final   Candida krusei NOT DETECTED NOT DETECTED Final   Candida parapsilosis NOT DETECTED NOT DETECTED Final   Candida tropicalis NOT DETECTED NOT DETECTED Final   Cryptococcus neoformans/gattii NOT DETECTED NOT DETECTED Final   Meth resistant mecA/C and MREJ NOT DETECTED NOT DETECTED Final    Comment: Performed at [facility], [address] [place], [place] [address]  Blood  Culture (routine x 2)     Status: Abnormal   Collection Time: 12/31/22 11:05 AM   Specimen: BLOOD  Result Value Ref Range Status   Specimen Description   Final    BLOOD BLOOD LEFT ARM Performed at [facility], [address], [place], [place] [address]    Special Requests   Final    BOTTLES DRAWN AEROBIC AND ANAEROBIC Blood Culture adequate volume Performed at [facility], [address] [place], [place] [address]    Culture  Setup Time   Final    GRAM POSITIVE COCCI IN BOTH AEROBIC AND ANAEROBIC BOTTLES CRITICAL VALUE NOTED.  VALUE IS CONSISTENT WITH PREVIOUSLY REPORTED AND CALLED VALUE. GRAM STAIN REVIEWED-AGREE WITH RESULT ADC GRAM STAIN REVIEWED-AGREE WITH RESULT DRT    Culture (A)  Final    STAPHYLOCOCCUS AUREUS SUSCEPTIBILITIES PERFORMED ON PREVIOUS CULTURE WITHIN THE LAST 5 DAYS. Performed at [facility], [address] [place], [place] [address]    Report Status 01/03/2023 FINAL  Final  MRSA Next Gen by PCR, Nasal     Status: None   Collection Time: 12/31/22  9:17 PM   Specimen: Nasal Mucosa; Nasal Swab  Result Value Ref Range Status   MRSA by PCR Next Gen NOT DETECTED NOT DETECTED Final    Comment: (NOTE) The GeneXpert MRSA Assay (FDA approved for NASAL specimens only), is one component of a comprehensive MRSA colonization surveillance program. It is not intended to diagnose MRSA infection nor to guide or monitor treatment for MRSA infections. Test performance is not FDA approved in patients less than 3 years old. Performed at [facility], [address] [place], [place] [address]   Culture, blood (Routine X 2) w Reflex to ID Panel     Status: None (Preliminary result)   Collection Time: 01/02/23  5:40 AM   Specimen: BLOOD RIGHT ARM  Result Value Ref Range Status   Specimen Description BLOOD RIGHT ARM  Final   Special Requests BOTTLES DRAWN AEROBIC AND ANAEROBIC  Final   Culture   Final    NO GROWTH 3 DAYS Performed  at [facility], [address] [place], [place] [address]    Report Status PENDING  Incomplete  Culture, blood (Routine X 2) w Reflex to ID Panel     Status: None (Preliminary result)   Collection Time: 01/02/23  5:40 AM   Specimen: BLOOD RIGHT HAND  Result Value Ref Range Status   Specimen Description BLOOD RIGHT HAND  Final   Special Requests BOTTLES DRAWN AEROBIC AND ANAEROBIC  Final   Culture   Final    NO GROWTH 3 DAYS Performed at [facility], [address], [place], [place] [address]    Report Status PENDING  Incomplete     Total time spend on discharging this patient, including the last patient exam, discussing the hospital stay, instructions for ongoing care  as it relates to all pertinent caregivers, as well as preparing the medical discharge records, prescriptions, and/or referrals as applicable, is 40 minutes.    Darlin Priestly, MD  [facility] 01/05/2023, 8:50 AM

## 2023-01-05 NOTE — Plan of Care (Signed)
  Problem: Fluid Volume: Goal: Hemodynamic stability will improve Outcome: Progressing   Problem: Respiratory: Goal: Ability to maintain adequate ventilation will improve Outcome: Progressing   Problem: Activity: Goal: Risk for activity intolerance will decrease Outcome: Progressing   Problem: Nutrition: Goal: Adequate nutrition will be maintained Outcome: Progressing   Problem: Elimination: Goal: Will not experience complications related to bowel motility Outcome: Progressing   Problem: Pain Management: Goal: General experience of comfort will improve Outcome: Progressing   Problem: Safety: Goal: Ability to remain free from injury will improve Outcome: Progressing   Problem: Skin Integrity: Goal: Risk for impaired skin integrity will decrease Outcome: Progressing

## 2023-01-05 NOTE — TOC Transition Note (Signed)
Transition of Care [facility]) - CM/SW Discharge Note   Patient Details  Name: Penny Gomez MRN: [id number] Date of Birth: 02-20-1985  [facility] CM/SW Contact:  Rodney Langton, RN Phone Number: 01/05/2023, 9:31 AM   Clinical Narrative:     Patient with discharge orders today, family will provide transportation.  Pam with [employer] aware of discharge.  No further TOC needs at this time.   Final next level of care: Home w Home Health Services Barriers to Discharge: Barriers Resolved   Patient Goals and CMS Choice      Discharge Placement                         Discharge Plan and Services Additional resources added to the After Visit Summary for       Post Acute Care Choice: Home Health                    HH Arranged: IV Antibiotics HH Agency: [employer] Date HH Agency Contacted: 01/05/23 Time HH Agency Contacted: [phone] Representative spoke with at [facility] Agency: Pam  Social Determinants of Health (SDOH) Interventions SDOH Screenings   Food Insecurity: No Food Insecurity (12/31/2022)  Housing: Low Risk  (12/31/2022)  Transportation Needs: No Transportation Needs (12/31/2022)  Utilities: Not At Risk (12/31/2022)  Depression (PHQ2-9): Low Risk  (10/10/2022)  Financial Resource Strain: Low Risk  (10/10/2022)  Stress: No Stress Concern Present (10/10/2022)  Tobacco Use: Low Risk  (12/31/2022)  Health Literacy: Adequate Health Literacy (10/10/2022)     Readmission Risk Interventions     No data to display

## 2023-01-05 NOTE — Progress Notes (Signed)

## 2023-01-06 ENCOUNTER — Other Ambulatory Visit: Payer: Medicaid Other

## 2023-01-06 ENCOUNTER — Telehealth: Payer: Self-pay

## 2023-01-06 ENCOUNTER — Other Ambulatory Visit: Payer: Self-pay

## 2023-01-06 ENCOUNTER — Ambulatory Visit: Payer: Medicaid Other

## 2023-01-06 NOTE — Telephone Encounter (Signed)
Pt scheduled to see Dr. Rivka Safer on 12/12, please schedule MD only a few days after that. Please notify pt of appt details.

## 2023-01-06 NOTE — Telephone Encounter (Signed)
-----   Message from [name] sent at 01/06/2023  8:19 AM EST ----- Please arrange her to see me a few days after she sees Dr. Rivka Safer. - ID  MD only.

## 2023-01-07 LAB — CULTURE, BLOOD (ROUTINE X 2)
Culture: NO GROWTH
Culture: NO GROWTH

## 2023-01-08 ENCOUNTER — Emergency Department
Admission: EM | Admit: 2023-01-08 | Discharge: 2023-01-09 | Disposition: A | Discharge status: Home / Self Care | Payer: Medicaid Other | Attending: Emergency Medicine | Admitting: Emergency Medicine

## 2023-01-08 ENCOUNTER — Encounter: Payer: Medicaid Other | Admitting: Surgery

## 2023-01-08 DIAGNOSIS — Z7901 Long term (current) use of anticoagulants: Secondary | ICD-10-CM | POA: Insufficient documentation

## 2023-01-08 DIAGNOSIS — Z4589 Encounter for adjustment and management of other implanted devices: Secondary | ICD-10-CM

## 2023-01-08 DIAGNOSIS — Z853 Personal history of malignant neoplasm of breast: Secondary | ICD-10-CM | POA: Insufficient documentation

## 2023-01-08 DIAGNOSIS — Z452 Encounter for adjustment and management of vascular access device: Secondary | ICD-10-CM | POA: Insufficient documentation

## 2023-01-08 NOTE — ED Triage Notes (Signed)
Pt states had a picc line placed on Sunday. Pt states line is painful to touch, swelling noted around line, no redness noted. Pt states when she flushes line it is painful and does not have blood return.

## 2023-01-08 NOTE — ED Provider Notes (Signed)
[facility] Provider Note    Event Date/Time   First MD Initiated Contact with Patient 01/08/23 2325     (approximate)   History   Vascular Access Problem   HPI  Penny Gomez is a 37 y.o. female who presents to the ED for evaluation of Vascular Access Problem   I reviewed medical DC summary from 3 days ago.  Seen for MSSA bacteremia and port infection.  Left breast cancer, PE on Eliquis.  Port removed and midline placed for home Ancef.  2 g, every 8 for 2 weeks until 12/4.  Then switching to linezolid oral.  Patient presents because her midline is not functioning when she attempted to provide herself her evening dose.  No other concerns or symptoms.   Physical Exam   Triage Vital Signs: ED Triage Vitals [01/08/23 1923]  Encounter Vitals Group     BP 121/83     Systolic BP Percentile      Diastolic BP Percentile      Pulse Rate 75     Resp 18     Temp 97.9 F (36.6 C)     Temp Source Oral     SpO2 100 %     Weight 140 lb (63.5 kg)     Height 5' (1.524 m)     Head Circumference      Peak Flow      Pain Score 0     Pain Loc      Pain Education      Exclude from Growth Chart     Most recent vital signs: Vitals:   01/08/23 1923 01/09/23 0051  BP: 121/83 115/70  Pulse: 75 78  Resp: 18 16  Temp: 97.9 F (36.6 C) 98.2 F (36.8 C)  SpO2: 100% 100%    General: Awake, no distress.  CV:  Good peripheral perfusion.  Resp:  Normal effort.  Abd:  No distention.  MSK:  No deformity noted.  Neuro:  No focal deficits appreciated. Other:  Midline to the left upper extremity in place with a clean insertion site, mild amount of close of swelling around this area.  Distally neurovascularly intact.  No bruising   ED Results / Procedures / Treatments   Labs (all labs ordered are listed, but only abnormal results are displayed) Labs Reviewed - No data to display  EKG   RADIOLOGY   Official radiology report(s): No results  found.  PROCEDURES and INTERVENTIONS:  Procedures  Medications - No data to display   IMPRESSION / MDM / ASSESSMENT AND PLAN / ED COURSE  I reviewed the triage vital signs and the nursing notes.  Differential diagnosis includes, but is not limited to, DVT, midline malfunction, worsening bacteremia or sepsis  Patient presents with midline replacement suitable for outpatient management continuing her home regimen  Clinical Course as of 01/09/23 0100  Thu Jan 09, 2023  [id number] New midline placed to the right arm seems to be functioning.  Offered patient Ancef here in the ED but she is comfortable just doing this at home which I think is reasonable.  She looks well.  We discussed expectant management and return precautions. [DS]    Clinical Course User Index [DS] Delton Prairie, MD     FINAL CLINICAL IMPRESSION(S) / ED DIAGNOSES   Final diagnoses:  Encounter for management of peripherally inserted central catheter (PICC)     Rx / DC Orders   ED Discharge Orders     None  Note:  This document was prepared using Dragon voice recognition software and may include unintentional dictation errors.   Delton Prairie, MD 01/09/23 [phone]

## 2023-01-09 NOTE — Progress Notes (Signed)

## 2023-01-09 NOTE — ED Notes (Signed)
Iv team at bedside  

## 2023-01-12 ENCOUNTER — Encounter: Payer: Self-pay | Admitting: Emergency Medicine

## 2023-01-12 ENCOUNTER — Emergency Department
Admission: EM | Admit: 2023-01-12 | Discharge: 2023-01-12 | Disposition: A | Discharge status: Home / Self Care | Payer: Medicaid Other | Attending: Emergency Medicine | Admitting: Emergency Medicine

## 2023-01-12 ENCOUNTER — Other Ambulatory Visit: Payer: Self-pay

## 2023-01-12 DIAGNOSIS — Z853 Personal history of malignant neoplasm of breast: Secondary | ICD-10-CM | POA: Insufficient documentation

## 2023-01-12 DIAGNOSIS — T82598A Other mechanical complication of other cardiac and vascular devices and implants, initial encounter: Secondary | ICD-10-CM

## 2023-01-12 DIAGNOSIS — T82898A Other specified complication of vascular prosthetic devices, implants and grafts, initial encounter: Secondary | ICD-10-CM | POA: Diagnosis present

## 2023-01-12 DIAGNOSIS — Z7901 Long term (current) use of anticoagulants: Secondary | ICD-10-CM | POA: Diagnosis not present

## 2023-01-12 DIAGNOSIS — Z789 Other specified health status: Secondary | ICD-10-CM

## 2023-01-12 NOTE — Progress Notes (Signed)
Assessed midline.  Noted to be near anterior elbow, so when pt bends her arm, it nudges the tubing lateral.  This results in difficulty flushing.  Once gently reposition tubing nedially, flushes well.  Pt and mother demonstrated flushing without difficulty and with understanding.  RN and PA aware no interventions needed.

## 2023-01-12 NOTE — Discharge Instructions (Signed)
Your line was evaluated by the IV team.  It is now usable again.  Please return for any new, worsening, or change in symptoms or other concerns.  Continue to take your antibiotics as directed by your physician.  It was a pleasure caring for you today.

## 2023-01-12 NOTE — ED Notes (Signed)
IV access team at bedside 

## 2023-01-12 NOTE — ED Provider Notes (Signed)
[facility] Provider Note    None    (approximate)   History   Vascular Access Problem   HPI  CARIN ASKELAND is a 37 y.o. female with a past medical history of breast cancer embolism on Eliquis, recent admission for Staph aureus bacteremia from an infected port who Leander Rams presents today for evaluation of her PICC line.  Patient reports that it was harder to flush today which is what caused her concern and prompted her to come to the emergency department.  She was able to infuse her antibiotics however, has not missed any doses.  She just wants to be sure that it is not becoming clogged or broken.  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) 11/24/2022   Multifocal pneumonia 11/24/2022   Pulmonary infarct (HCC) 11/24/2022   Genetic testing 11/11/2022   BRCA1 gene mutation positive 11/11/2022   Encounter for antineoplastic chemotherapy 10/25/2022   Port-A-Cath in place 10/25/2022   Invasive carcinoma of breast (HCC) 10/10/2022   Goals of care, counseling/discussion 10/10/2022   Family history of cancer 10/10/2022   IDA (iron deficiency anemia) 10/10/2022   Urinary frequency 03/19/2022          Physical Exam   Triage Vital Signs: ED Triage Vitals  Encounter Vitals Group     BP 01/12/23 0925 132/70     Systolic BP Percentile --      Diastolic BP Percentile --      Pulse Rate 01/12/23 0925 67     Resp 01/12/23 0925 18     Temp 01/12/23 0925 97.7 F (36.5 C)     Temp Source 01/12/23 0925 Oral     SpO2 01/12/23 0925 100 %     Weight 01/12/23 0924 140 lb (63.5 kg)      Height 01/12/23 0924 5' (1.524 m)     Head Circumference --      Peak Flow --      Pain Score 01/12/23 0923 0     Pain Loc --      Pain Education --      Exclude from Growth Chart --     Most recent vital signs: Vitals:   01/12/23 0925 01/12/23 1019  BP: 132/70 132/70  Pulse: 67 65  Resp: 18 15  Temp: 97.7 F (36.5 C) 97.7 F (36.5 C)  SpO2: 100% 100%    Physical Exam Vitals and nursing note reviewed.  Constitutional:      General: Awake and alert. No acute distress.    Appearance: Normal appearance. The patient is normal weight.  HENT:     Head: Normocephalic and atraumatic.     Mouth: Mucous membranes are moist.  Eyes:     General: PERRL. Normal EOMs        Right eye: No discharge.        Left eye: No discharge.     Conjunctiva/sclera: Conjunctivae normal.  Cardiovascular:     Rate and Rhythm: Normal rate and regular rhythm.     Pulses: Normal pulses.  Pulmonary:     Effort: Pulmonary effort is normal. No respiratory distress.  Breath sounds: Normal breath sounds.  Abdominal:     Abdomen is soft. There is no abdominal tenderness. No rebound or guarding. No distention. Musculoskeletal:        General: No swelling. Normal range of motion.     Cervical back: Normal range of motion and neck supple.  Skin:    General: Skin is warm and dry.     Capillary Refill: Capillary refill takes less than 2 seconds.     Findings: Midline noted to right upper arm, no surrounding erythema or swelling.  No lymphangitis noted. Neurological:     Mental Status: The patient is awake and alert.      ED Results / Procedures / Treatments   Labs (all labs ordered are listed, but only abnormal results are displayed) Labs Reviewed - No data to display   EKG     RADIOLOGY     PROCEDURES:  Critical Care performed:   Procedures   MEDICATIONS ORDERED IN ED: Medications - No data to display   IMPRESSION / MDM / ASSESSMENT AND PLAN / ED COURSE  I reviewed the  triage vital signs and the nursing notes.   Differential diagnosis includes, but is not limited to, midline problem, kinked tube, clotted line.  Patient is awake and alert, hemodynamically stable and afebrile.  I reviewed the patient's chart.  Patient recently had an admission for sepsis from a infected port, was discharged in 01/05/2023.  She was instructed to give herself cefazolin 2 g IV every 8 hours for 2 weeks until 12/4 and then start linezolid 600 mg p.o. twice daily for 2 weeks until 12/18.  Patient returned to the emergency department on 01/08/2023 for a midline problem, and it was switched to the other side.  Patient is awake and alert, hemodynamically stable and afebrile.  I consulted the IV/PICC line team who came to evaluate the patient in the line.  Per the IV team nurse, the midline is noted to be near the anterior elbow so that when she bends her arm it moves the tubing laterally which resulted in difficult flushing.  The RN was able to explain repositioning and the tube flushed well.  Patient and her mother were both able to flush easily and they are comfortable with this.  Patient was discharged in stable condition.  She has not missed any antibiotic doses.   Patient's presentation is most consistent with acute complicated illness / injury requiring diagnostic workup.   Clinical Course as of 01/12/23 1255  Sun Jan 12, 2023  0957 IV team came to evaluate, reports that she was bending her arm when flushing, they reeducated her and it now flushes very easily [JP]    Clinical Course User Index [JP] Uyen Eichholz, Herb Grays, PA-C     FINAL CLINICAL IMPRESSION(S) / ED DIAGNOSES   Final diagnoses:  IV infusion line dysfunction, initial encounter (HCC)  Problem with vascular access     Rx / DC Orders   ED Discharge Orders     None        Note:  This document was prepared using Dragon voice recognition software and may include unintentional dictation errors.   Jackelyn Hoehn, PA-C 01/12/23 1255    Janith Lima, MD 01/12/23 [phone]

## 2023-01-12 NOTE — ED Triage Notes (Signed)
Pt via POV from home. Pt c/o PICC line issue, states that she was here 4 days ago and they had to switch arm. Pt has a PICC in the R arm and states that she takes abx 3 times a day. Reports that she was able to flush it with meds and flush but it was resistant. Denies any pain or swelling. Pt is A&Ox4 and NAD

## 2023-01-13 ENCOUNTER — Other Ambulatory Visit: Payer: Medicaid Other

## 2023-01-13 ENCOUNTER — Ambulatory Visit: Payer: Medicaid Other

## 2023-01-14 ENCOUNTER — Encounter: Payer: Self-pay | Admitting: *Deleted

## 2023-01-14 ENCOUNTER — Ambulatory Visit: Payer: Medicaid Other

## 2023-01-14 ENCOUNTER — Encounter: Payer: Self-pay | Admitting: Oncology

## 2023-01-15 ENCOUNTER — Ambulatory Visit: Payer: Medicaid Other

## 2023-01-16 ENCOUNTER — Ambulatory Visit: Payer: Medicaid Other

## 2023-01-17 ENCOUNTER — Ambulatory Visit: Payer: Medicaid Other | Admitting: Surgery

## 2023-01-17 ENCOUNTER — Ambulatory Visit: Payer: Medicaid Other

## 2023-01-17 ENCOUNTER — Ambulatory Visit: Payer: Medicaid Other | Admitting: Oncology

## 2023-01-17 ENCOUNTER — Other Ambulatory Visit: Payer: Medicaid Other

## 2023-01-20 ENCOUNTER — Ambulatory Visit: Payer: Medicaid Other

## 2023-01-20 ENCOUNTER — Other Ambulatory Visit: Payer: Medicaid Other

## 2023-01-20 ENCOUNTER — Telehealth: Payer: Self-pay

## 2023-01-20 ENCOUNTER — Ambulatory Visit: Payer: Medicaid Other | Admitting: Oncology

## 2023-01-20 DIAGNOSIS — R509 Fever, unspecified: Secondary | ICD-10-CM

## 2023-01-20 NOTE — Telephone Encounter (Signed)
Mychart sent to pt with plan. R/s MD on 12/17 to 12/19 and add labs please.

## 2023-01-21 ENCOUNTER — Encounter: Payer: Self-pay | Admitting: Oncology

## 2023-01-21 ENCOUNTER — Ambulatory Visit: Payer: Self-pay | Admitting: Surgery

## 2023-01-21 NOTE — H&P (Signed)
Subjective:   CC: Malignant neoplasm of female breast, unspecified estrogen receptor status, unspecified laterality, unspecified site of breast (CMS/HHS-HCC) [C50.919] HPI:  Penny Gomez is a 37 y.o. female who presents for evaluation of above. Undergoing neoadjuvant but discontinued due to recent port infection.  Requesting transitioning of care to use.  Currently doing well.  On oral abx.    Past Medical History:  has no past medical history on file.  Past Surgical History:  has a past surgical history that includes Cesarean section.  Family History: family history is not on file.  Social History:  reports that she has never smoked. She has never used smokeless tobacco. She reports that she does not currently use alcohol. No history on file for drug use.  Current Medications: has a current medication list which includes the following prescription(s): apixaban, linezolid, and sennosides.  Allergies:  Allergies as of 01/20/2023   (No Known Allergies)    ROS:  A 15 point review of systems was performed and was negative except as noted in HPI   Objective:     BP 107/62   Pulse 70   Ht 152.4 cm (5')   Wt 63.5 kg (140 lb)   BMI 27.34 kg/m   Constitutional :  No distress, cooperative, alert  Lymphatics/Throat:  Supple with no lymphadenopathy  Respiratory:  Clear to auscultation bilaterally  Cardiovascular:  Regular rate and rhythm  Gastrointestinal: Soft, non-tender, non-distended, no organomegaly.  Musculoskeletal: Steady gait and movement  Skin: Cool and moist, healing former port site on right chest wall  Psychiatric: Normal affect, non-agitated, not confused         LABS:  N/a   RADS: N/a  Assessment:   Malignant neoplasm of female breast, unspecified estrogen receptor status, unspecified laterality, unspecified site of breast (CMS/HHS-HCC) [C50.919] -will discuss options after neoadjuvant completed  Infected port, s/p removal. Will proceed with replacement  on left side, after repeat blood cx confirm negative at end of year.  Plan:     1. Malignant neoplasm of female breast, unspecified estrogen receptor status, unspecified laterality, unspecified site of breast (CMS/HHS-HCC) [C50.919]  Risk alternative benefits discussed.  Risks include bleeding, infection, dislocation, migration, malfunction, unsuccessful placement, pneumothorax, and additional procedures to address that risks.  Benefits include initiation of chemotherapy.  Alternative includes non-IV infusion therapy.  We discussed the need for the port to infuse IV chemotherapy agents, and how the port can be a temporary and/or permanent option depending on patient preference after completion of chemotherapy.  Port will be okay to use as soon as it is placed.  Patient verbalized understanding and all questions and concerns addressed.   labs/images/medications/previous chart entries reviewed personally and relevant changes/updates noted above.

## 2023-01-21 NOTE — H&P (View-Only) (Signed)
 Subjective:   CC: Malignant neoplasm of female breast, unspecified estrogen receptor status, unspecified laterality, unspecified site of breast (CMS/HHS-HCC) [C50.919] HPI:  Penny Gomez is a 37 y.o. female who presents for evaluation of above. Undergoing neoadjuvant but discontinued due to recent port infection.  Requesting transitioning of care to use.  Currently doing well.  On oral abx.    Past Medical History:  has no past medical history on file.  Past Surgical History:  has a past surgical history that includes Cesarean section.  Family History: family history is not on file.  Social History:  reports that she has never smoked. She has never used smokeless tobacco. She reports that she does not currently use alcohol. No history on file for drug use.  Current Medications: has a current medication list which includes the following prescription(s): apixaban, linezolid, and sennosides.  Allergies:  Allergies as of 01/20/2023   (No Known Allergies)    ROS:  A 15 point review of systems was performed and was negative except as noted in HPI   Objective:     BP 107/62   Pulse 70   Ht 152.4 cm (5')   Wt 63.5 kg (140 lb)   BMI 27.34 kg/m   Constitutional :  No distress, cooperative, alert  Lymphatics/Throat:  Supple with no lymphadenopathy  Respiratory:  Clear to auscultation bilaterally  Cardiovascular:  Regular rate and rhythm  Gastrointestinal: Soft, non-tender, non-distended, no organomegaly.  Musculoskeletal: Steady gait and movement  Skin: Cool and moist, healing former port site on right chest wall  Psychiatric: Normal affect, non-agitated, not confused         LABS:  N/a   RADS: N/a  Assessment:   Malignant neoplasm of female breast, unspecified estrogen receptor status, unspecified laterality, unspecified site of breast (CMS/HHS-HCC) [C50.919] -will discuss options after neoadjuvant completed  Infected port, s/p removal. Will proceed with replacement  on left side, after repeat blood cx confirm negative at end of year.  Plan:     1. Malignant neoplasm of female breast, unspecified estrogen receptor status, unspecified laterality, unspecified site of breast (CMS/HHS-HCC) [C50.919]  Risk alternative benefits discussed.  Risks include bleeding, infection, dislocation, migration, malfunction, unsuccessful placement, pneumothorax, and additional procedures to address that risks.  Benefits include initiation of chemotherapy.  Alternative includes non-IV infusion therapy.  We discussed the need for the port to infuse IV chemotherapy agents, and how the port can be a temporary and/or permanent option depending on patient preference after completion of chemotherapy.  Port will be okay to use as soon as it is placed.  Patient verbalized understanding and all questions and concerns addressed.   labs/images/medications/previous chart entries reviewed personally and relevant changes/updates noted above.

## 2023-01-22 ENCOUNTER — Ambulatory Visit: Payer: Medicaid Other

## 2023-01-23 ENCOUNTER — Other Ambulatory Visit
Admission: RE | Admit: 2023-01-23 | Discharge: 2023-01-23 | Disposition: A | Discharge status: Home / Self Care | Payer: Medicaid Other | Source: Ambulatory Visit | Attending: Infectious Diseases | Admitting: Infectious Diseases

## 2023-01-23 ENCOUNTER — Ambulatory Visit: Payer: Medicaid Other | Attending: Infectious Diseases | Admitting: Infectious Diseases

## 2023-01-23 ENCOUNTER — Encounter: Payer: Self-pay | Admitting: Infectious Diseases

## 2023-01-23 VITALS — BP 109/73 | HR 71 | Temp 97.7°F | Ht 60.0 in | Wt 144.0 lb

## 2023-01-23 DIAGNOSIS — C50919 Malignant neoplasm of unspecified site of unspecified female breast: Secondary | ICD-10-CM | POA: Insufficient documentation

## 2023-01-23 DIAGNOSIS — B9561 Methicillin susceptible Staphylococcus aureus infection as the cause of diseases classified elsewhere: Secondary | ICD-10-CM | POA: Insufficient documentation

## 2023-01-23 DIAGNOSIS — R7881 Bacteremia: Secondary | ICD-10-CM | POA: Insufficient documentation

## 2023-01-23 LAB — CBC WITH DIFFERENTIAL/PLATELET
Abs Immature Granulocytes: 0.01 10*3/uL (ref 0.00–0.07)
Basophils Absolute: 0 10*3/uL (ref 0.0–0.1)
Basophils Relative: 1 %
Eosinophils Absolute: 0.1 10*3/uL (ref 0.0–0.5)
Eosinophils Relative: 2 %
HCT: 38.8 % (ref 36.0–46.0)
Hemoglobin: 12.4 g/dL (ref 12.0–15.0)
Immature Granulocytes: 0 %
Lymphocytes Relative: 48 %
Lymphs Abs: 2.1 10*3/uL (ref 0.7–4.0)
MCH: 30.6 pg (ref 26.0–34.0)
MCHC: 32 g/dL (ref 30.0–36.0)
MCV: 95.8 fL (ref 80.0–100.0)
Monocytes Absolute: 0.2 10*3/uL (ref 0.1–1.0)
Monocytes Relative: 6 %
Neutro Abs: 1.9 10*3/uL (ref 1.7–7.7)
Neutrophils Relative %: 43 %
Platelets: 190 10*3/uL (ref 150–400)
RBC: 4.05 MIL/uL (ref 3.87–5.11)
RDW: 17.1 % — ABNORMAL HIGH (ref 11.5–15.5)
WBC: 4.4 10*3/uL (ref 4.0–10.5)
nRBC: 0 % (ref 0.0–0.2)

## 2023-01-23 LAB — COMPREHENSIVE METABOLIC PANEL
ALT: 15 U/L (ref 0–44)
AST: 17 U/L (ref 15–41)
Albumin: 3.8 g/dL (ref 3.5–5.0)
Alkaline Phosphatase: 71 U/L (ref 38–126)
Anion gap: 6 (ref 5–15)
BUN: 14 mg/dL (ref 6–20)
CO2: 26 mmol/L (ref 22–32)
Calcium: 8.9 mg/dL (ref 8.9–10.3)
Chloride: 105 mmol/L (ref 98–111)
Creatinine, Ser: 0.69 mg/dL (ref 0.44–1.00)
GFR, Estimated: >60 mL/min (ref >60)
Glucose, Bld: 89 mg/dL (ref 70–99)
Potassium: 4.3 mmol/L (ref 3.5–5.1)
Sodium: 137 mmol/L (ref 135–145)
Total Bilirubin: 0.4 mg/dL (ref <1.2)
Total Protein: 7.1 g/dL (ref 6.5–8.1)

## 2023-01-23 NOTE — Progress Notes (Signed)
NAME: Penny Gomez  DOB: Jul 22, 1985  MRN: [id number]  Date/Time: 01/23/2023 10:46 AM   Subjective:   Follow up after recent discharge for MSSA bacteremia Was in [facility] between 12/31/22-01/05/23 ? Penny Gomez is a 37 y.o. with a history of breast Ca, on neoadjuvant chemo was admitted with fever  12/30/22 BC- MSSA 12/31/22 BC  MSSA 11/19 PORT removed 11/21 BC-NG 11/21 TEE NG Was discharged on IV cefazolin for 2 weeks until 01/15/23 which she completed and is followed by Po linezolid for 2 weeksuntil 12/18. 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    Past Surgical History:  Procedure Laterality Date   BREAST BIOPSY Right 05/20/2019   Affirm Bx- X- clip, neg   BREAST BIOPSY Left 10/02/2022   [place] Bx, path pending   BREAST BIOPSY Left 10/02/2022   [place] Bx Node- path pending   BREAST BIOPSY Left 10/02/2022   [place] LT BREAST BX W LOC DEV 1ST LESION IMG BX SPEC US GUIDE 10/02/2022 [facility]   CESAREAN SECTION     LAPAROSCOPIC BILATERAL SALPINGECTOMY Bilateral 02/09/2022   Procedure: LAPAROSCOPIC BILATERAL SALPINGECTOMY;  Surgeon: Hildred Laser, MD;  Location: [facility] ORS;  Service: Gynecology;  Laterality: Bilateral;   PORTACATH PLACEMENT Right 10/17/2022   Procedure: INSERTION PORT-A-CATH;  Surgeon: Henrene Dodge, MD;  Location: [facility] ORS;  Service: General;  Laterality: Right;   TEE WITHOUT CARDIOVERSION N/A 01/02/2023   Procedure: TRANSESOPHAGEAL ECHOCARDIOGRAM (TEE);  Surgeon: Antonieta Iba, MD;  Location: [facility] ORS;  Service: Cardiovascular;  Laterality: N/A;   TUBAL LIGATION      Social History   Socioeconomic History    Marital status: Single    Spouse name: Not on file   Number of children: 2   Years of education: Not on file   Highest education level: High school graduate  Occupational History   Not on file  Tobacco Use   Smoking status: Never    Passive exposure: Never   Smokeless tobacco: Never  Vaping Use   Vaping status: Never Used  Substance and Sexual Activity   Alcohol use: Yes    Comment: occ   Drug use: No   Sexual activity: Yes    Birth control/protection: Surgical  Other Topics Concern   Not on file  Social History Narrative   Not on file   Social Drivers of Health   Financial Resource Strain: Low Risk  (01/20/2023)   Received from [facility]   Overall Financial Resource Strain (CARDIA)    Difficulty of Paying Living Expenses: Not hard at all  Food Insecurity: No Food Insecurity (01/20/2023)   Received from [facility]   Hunger Vital Sign    Worried About Running Out of Food in the Last Year: Never true    Ran Out of Food in the Last Year: Never true  Transportation Needs: No Transportation Needs (01/20/2023)   Received from [facility] Transportation    In the past 12 months, has lack of transportation kept you from  medical appointments or from getting medications?: No    Lack of Transportation (Non-Medical): No  Physical Activity: Not on file  Stress: No Stress Concern Present (10/10/2022)   [employer] - Occupational Stress Questionnaire    Feeling of Stress : Only a little  Social Connections: Not on file  Intimate Partner Violence: Not At Risk (12/31/2022)   Humiliation, Afraid, Rape, and Kick questionnaire    Fear of Current or Ex-Partner: No    Emotionally Abused: No    Physically Abused: No    Sexually Abused: No    Family History  Problem Relation Age of Onset   Breast cancer Mother 26       no GT   Breast cancer Maternal Aunt 33   Breast cancer Maternal Aunt     Lung cancer Maternal Uncle    Lung cancer Maternal Uncle    Bone cancer Paternal Aunt    Pancreatic cancer Maternal Grandmother 29   Lung cancer Maternal Grandfather 18   Breast cancer Cousin 40   Breast cancer Other 33       gene pos?   No Known Allergies I? Current Outpatient Medications  Medication Sig Dispense Refill   acetaminophen (TYLENOL) 500 MG tablet Take 2 tablets (1,000 mg total) by mouth every 6 (six) hours as needed for mild pain.     apixaban (ELIQUIS) 5 MG TABS tablet Take 1 tablet (5 mg total) by mouth 2 (two) times daily. Start AFTER completing your starter pack 60 tablet 1   Calcium Carb-Cholecalciferol 600-10 MG-MCG TABS Take 1 tablet by mouth 2 (two) times daily.     Calcium Carbonate-Vitamin D (CALCIUM 600 +D HIGH POTENCY) 600-10 MG-MCG TABS Take 1 tablet by mouth 2 (two) times daily. 60 tablet 3   lidocaine-prilocaine (EMLA) cream Apply 1 Application topically as needed. 30 g 11   linezolid (ZYVOX) 600 MG tablet Take 1 tablet (600 mg total) by mouth every 12 (twelve) hours for 14 days.     senna (SENOKOT) 8.6 MG TABS tablet Take 2 tablets (17.2 mg total) by mouth daily. 120 tablet 2   simethicone (MYLICON) 80 MG chewable tablet Chew 1 tablet (80 mg total) by mouth every 6 (six) hours as needed for flatulence. 30 tablet 2   No current facility-administered medications for this visit.     Abtx:  Anti-infectives (From admission, onward)    None       REVIEW OF SYSTEMS:  Const: negative fever, negative chills, negative weight loss Eyes: negative diplopia or visual changes, negative eye pain ENT: negative coryza, negative sore throat Resp: negative cough, hemoptysis, dyspnea Cards: negative for chest pain, palpitations, lower extremity edema GU: negative for frequency, dysuria and hematuria GI: Negative for abdominal pain, diarrhea, bleeding, constipation Skin: negative for rash and pruritus Heme: negative for easy bruising and gum/nose bleeding MS:  negative for myalgias, arthralgias, back pain and muscle weakness Neurolo:negative for headaches, dizziness, vertigo, memory problems  Psych: negative for feelings of anxiety, depression  Endocrine: negative for thyroid, diabetes Allergy/Immunology- negative for any medication or food allergies ?  Objective:  VITALS:  BP 109/73   Pulse 71   Temp 97.7 F (36.5 C) (Temporal)   Ht 5' (1.524 m)   Wt 144 lb (65.3 kg)   BMI 28.12 kg/m   PHYSICAL EXAM:  General: Alert, cooperative, no distress, appears stated age.  Head: Normocephalic, without obvious abnormality, atraumatic. Eyes: Conjunctivae clear, anicteric sclerae. Pupils are equal ENT Nares normal. No drainage  or sinus tenderness. Lips, mucosa, and tongue normal. No Thrush Neck: Supple, symmetrical, no adenopathy, thyroid: non tender no carotid bruit and no JVD. Back: No CVA tenderness. Lungs: Clear to auscultation bilaterally. No Wheezing or Rhonchi. No rales. Heart: Regular rate and rhythm, no murmur, rub or gallop. Abdomen: Soft, non-tender,not distended. Bowel sounds normal. No masses Extremities: atraumatic, no cyanosis. No edema. No clubbing Skin: No rashes or lesions. Or bruising Lymph: Cervical, supraclavicular normal. Neurologic: Grossly non-focal Pertinent Labs Lab Results CBC    Component Value Date/Time   WBC 7.9 01/05/2023 0321   RBC 3.18 (L) 01/05/2023 0321   HGB 9.2 (L) 01/05/2023 0321   HGB 11.6 (L) 12/30/2022 0828   HGB 11.3 (L) 02/06/2012 1022   HCT 28.2 (L) 01/05/2023 0321   HCT 34.1 (L) 02/08/2012 0514   PLT 142 (L) 01/05/2023 0321   PLT 184 12/30/2022 0828   PLT 169 02/06/2012 1022   MCV 88.7 01/05/2023 0321   MCV 88 02/06/2012 1022   MCH 28.9 01/05/2023 0321   MCHC 32.6 01/05/2023 0321   RDW 26.2 (H) 01/05/2023 0321   RDW 17.4 (H) 02/06/2012 1022   LYMPHSABS 0.9 01/01/2023 0346   LYMPHSABS 1.4 02/06/2012 1022   MONOABS 0.4 01/01/2023 0346   MONOABS 0.7 02/06/2012 1022   EOSABS 0.0  01/01/2023 0346   EOSABS 0.1 02/06/2012 1022   BASOSABS 0.0 01/01/2023 0346   BASOSABS 0.0 02/06/2012 1022       Latest Ref Rng & Units 01/05/2023    3:21 AM 01/03/2023    6:18 AM 01/01/2023    3:46 AM  CMP  Glucose 70 - 99 mg/dL 956  387  564   BUN 6 - 20 mg/dL 9  8  9    Creatinine 0.44 - 1.00 mg/dL 3.32  9.51  8.84   Sodium 135 - 145 mmol/L 137  137  129   Potassium 3.5 - 5.1 mmol/L 4.4  3.9  3.7   Chloride 98 - 111 mmol/L 103  102  103   CO2 22 - 32 mmol/L 27  27  22    Calcium 8.9 - 10.3 mg/dL 9.0  9.2  8.0   Total Protein 6.5 - 8.1 g/dL   6.6   Total Bilirubin <1.2 mg/dL   0.7   Alkaline Phos 38 - 126 U/L   74   AST 15 - 41 U/L   53   ALT 0 - 44 U/L   57    ? Impression/Recommendation ?MSSA bacteremia secondary to port infection- resolved with port removal and Antibiotics. She is now on week 4 of Antibioitc and will complete on 12/18 On linezolid Will do labs today to check CBC/CMP  Ca breast on neoadjuvant chemo Has an appt with heme  PORT is being planned for 12/30 Heme/onc can check blood culture after christmas Discussed the management with her and her mom Follow PRN with me _______

## 2023-01-23 NOTE — Patient Instructions (Signed)
You are here as follow up for the recent staphlococcus bacteremia- you completed 2 weeks of IV and now on PO linezolid 600mg  BID until 01/29/23- today will do labs.

## 2023-01-24 ENCOUNTER — Inpatient Hospital Stay: Admission: RE | Admit: 2023-01-24 | Payer: Medicaid Other | Source: Ambulatory Visit

## 2023-01-27 ENCOUNTER — Telehealth: Payer: Self-pay

## 2023-01-27 NOTE — Telephone Encounter (Signed)
-----   Message from Lynn Ito sent at 01/24/2023  2:53 PM EST ----- Please let her know that her labs look good and she will finish linezolid as planned next week ----- Message ----- From: Interface, Lab In [place] Sent: 01/23/2023  11:41 AM EST To: Lynn Ito, MD

## 2023-01-28 ENCOUNTER — Ambulatory Visit: Payer: Medicaid Other | Admitting: Oncology

## 2023-01-29 ENCOUNTER — Encounter
Admission: RE | Admit: 2023-01-29 | Discharge: 2023-01-29 | Disposition: A | Discharge status: Home / Self Care | Payer: Medicaid Other | Source: Ambulatory Visit | Attending: Surgery | Admitting: Surgery

## 2023-01-29 DIAGNOSIS — Z01818 Encounter for other preprocedural examination: Secondary | ICD-10-CM

## 2023-01-29 HISTORY — DX: Bacteremia: R78.81

## 2023-01-29 HISTORY — DX: Genetic susceptibility to malignant neoplasm of breast: Z15.01

## 2023-01-29 HISTORY — DX: Thrombocytopenia, unspecified: D69.6

## 2023-01-29 HISTORY — DX: Sepsis, unspecified organism: A41.9

## 2023-01-29 HISTORY — DX: Endocarditis, valve unspecified: I38

## 2023-01-29 HISTORY — DX: Other pulmonary embolism without acute cor pulmonale: I26.99

## 2023-01-29 HISTORY — DX: Long term (current) use of anticoagulants: Z79.01

## 2023-01-29 HISTORY — DX: Unspecified complication of cardiac and vascular prosthetic device, implant and graft, initial encounter: T82.9XXA

## 2023-01-29 HISTORY — DX: Pneumonia, unspecified organism: J18.9

## 2023-01-29 HISTORY — DX: Immunodeficiency due to drugs: D84.821

## 2023-01-29 HISTORY — DX: Genetic susceptibility to other malignant neoplasm of digestive system: Z15.068

## 2023-01-29 HISTORY — DX: Methicillin susceptible Staphylococcus aureus infection as the cause of diseases classified elsewhere: B95.61

## 2023-01-29 HISTORY — DX: Long term (current) use of other immunomodulators and immunosuppressants: Z79.69

## 2023-01-29 HISTORY — DX: Other pneumonia, unspecified organism: J18.8

## 2023-01-29 NOTE — Patient Instructions (Addendum)
Your procedure is scheduled on:02-10-23 Monday Report to the Registration Desk on the 1st floor of the [facility] Mall.Then proceed to the 2nd floor Surgery Desk To find out your arrival time, please call [phone] between 1PM - 3PM on:02-07-23 Friday If your arrival time is 6:00 am, do not arrive before that time as the Medical Mall entrance doors do not open until 6:00 am.  REMEMBER: Instructions that are not followed completely may result in serious medical risk, up to and including death; or upon the discretion of your surgeon and anesthesiologist your surgery may need to be rescheduled.  Do not eat food after midnight the night before surgery.  No gum chewing or hard candies.  You may however, drink CLEAR liquids up to 2 hours before you are scheduled to arrive for your surgery. Do not drink anything within 2 hours of your scheduled arrival time.  Clear liquids include: - water  - apple juice without pulp - gatorade (not RED colors) - black coffee or tea (Do NOT add milk or creamers to the coffee or tea) Do NOT drink anything that is not on this list.  One week prior to surgery:Starting 02-03-23  Stop Anti-inflammatories (NSAIDS) such as Advil, Aleve, Ibuprofen, Motrin, Naproxen, Naprosyn and Aspirin based products such as Excedrin, Goody's Powder, BC Powder. Stop ANY OVER THE COUNTER supplements until after surgery.  You may however, continue to take Tylenol if needed for pain up until the day of surgery.  Dr Bethanne Ginger office or either Dr Geoffery Lyons office will instruct you on what you need to do regarding your Eliquis after your visit with Dr Cathie Hoops on 01-30-23   Continue taking all of your other prescription medications up until the day of surgery.  Do NOT take any medication the day of surgery  No Alcohol for 24 hours before or after surgery.  No Smoking including e-cigarettes for 24 hours before surgery.  No chewable tobacco products for at least 6 hours before surgery.  No  nicotine patches on the day of surgery.  Do not use any "recreational" drugs for at least a week (preferably 2 weeks) before your surgery.  Please be advised that the combination of cocaine and anesthesia may have negative outcomes, up to and including death. If you test positive for cocaine, your surgery will be cancelled.  On the morning of surgery brush your teeth with toothpaste and water, you may rinse your mouth with mouthwash if you wish. Do not swallow any toothpaste or mouthwash.  Use CHG Soap as directed on instruction sheet.  Do not wear jewelry, make-up, hairpins, clips or nail polish.  For welded (permanent) jewelry: bracelets, anklets, waist bands, etc.  Please have this removed prior to surgery.  If it is not removed, there is a chance that hospital personnel will need to cut it off on the day of surgery.  Do not wear lotions, powders, or perfumes.   Do not shave body hair from the neck down 48 hours before surgery.  Contact lenses, hearing aids and dentures may not be worn into surgery.  Do not bring valuables to the hospital. [facility] is not responsible for any missing/lost belongings or valuables.   Notify your doctor if there is any change in your medical condition (cold, fever, infection).  Wear comfortable clothing (specific to your surgery type) to the hospital.  After surgery, you can help prevent lung complications by doing breathing exercises.  Take deep breaths and cough every 1-2 hours. Your doctor may order  a device called an Incentive Spirometer to help you take deep breaths. When coughing or sneezing, hold a pillow firmly against your incision with both hands. This is called "splinting." Doing this helps protect your incision. It also decreases belly discomfort.  If you are being admitted to the hospital overnight, leave your suitcase in the car. After surgery it may be brought to your room.  In case of increased patient census, it may be necessary  for you, the patient, to continue your postoperative care in the Same Day Surgery department.  If you are being discharged the day of surgery, you will not be allowed to drive home. You will need a responsible individual to drive you home and stay with you for 24 hours after surgery.   If you are taking public transportation, you will need to have a responsible individual with you.  Please call the Pre-admissions Testing Dept. at [phone] if you have any questions about these instructions.  Surgery Visitation Policy:  Patients having surgery or a procedure may have two visitors.  Children under the age of 61 must have an adult with them who is not the patient.     Preparing for Surgery with CHLORHEXIDINE GLUCONATE (CHG) Soap  Chlorhexidine Gluconate (CHG) Soap  o An antiseptic cleaner that kills germs and bonds with the skin to continue killing germs even after washing  o Used for showering the night before surgery and morning of surgery  Before surgery, you can play an important role by reducing the number of germs on your skin.  CHG (Chlorhexidine gluconate) soap is an antiseptic cleanser which kills germs and bonds with the skin to continue killing germs even after washing.  Please do not use if you have an allergy to CHG or antibacterial soaps. If your skin becomes reddened/irritated stop using the CHG.  1. Shower the NIGHT BEFORE SURGERY and the MORNING OF SURGERY with CHG soap.  2. If you choose to wash your hair, wash your hair first as usual with your normal shampoo.  3. After shampooing, rinse your hair and body thoroughly to remove the shampoo.  4. Use CHG as you would any other liquid soap. You can apply CHG directly to the skin and wash gently with a scrungie or a clean washcloth.  5. Apply the CHG soap to your body only from the neck down. Do not use on open wounds or open sores. Avoid contact with your eyes, ears, mouth, and genitals (private parts). Wash  face and genitals (private parts) with your normal soap.  6. Wash thoroughly, paying special attention to the area where your surgery will be performed.  7. Thoroughly rinse your body with warm water.  8. Do not shower/wash with your normal soap after using and rinsing off the CHG soap.  9. Pat yourself dry with a clean towel.  10. Wear clean pajamas to bed the night before surgery.  12. Place clean sheets on your bed the night of your first shower and do not sleep with pets.  13. Shower again with the CHG soap on the day of surgery prior to arriving at the hospital.  14. Do not apply any deodorants/lotions/powders.  15. Please wear clean clothes to the hospital.

## 2023-01-30 ENCOUNTER — Encounter: Payer: Self-pay | Admitting: Oncology

## 2023-01-30 ENCOUNTER — Inpatient Hospital Stay: Payer: Medicaid Other

## 2023-01-30 ENCOUNTER — Inpatient Hospital Stay: Payer: Medicaid Other | Attending: Oncology | Admitting: Oncology

## 2023-01-30 VITALS — BP 109/77 | HR 61 | Temp 96.1°F | Resp 18 | Wt 144.9 lb

## 2023-01-30 DIAGNOSIS — D509 Iron deficiency anemia, unspecified: Secondary | ICD-10-CM | POA: Insufficient documentation

## 2023-01-30 DIAGNOSIS — Z79899 Other long term (current) drug therapy: Secondary | ICD-10-CM | POA: Insufficient documentation

## 2023-01-30 DIAGNOSIS — R509 Fever, unspecified: Secondary | ICD-10-CM

## 2023-01-30 DIAGNOSIS — D500 Iron deficiency anemia secondary to blood loss (chronic): Secondary | ICD-10-CM

## 2023-01-30 DIAGNOSIS — Z86711 Personal history of pulmonary embolism: Secondary | ICD-10-CM | POA: Insufficient documentation

## 2023-01-30 DIAGNOSIS — Z7901 Long term (current) use of anticoagulants: Secondary | ICD-10-CM | POA: Insufficient documentation

## 2023-01-30 DIAGNOSIS — I2699 Other pulmonary embolism without acute cor pulmonale: Secondary | ICD-10-CM

## 2023-01-30 DIAGNOSIS — C50312 Malignant neoplasm of lower-inner quadrant of left female breast: Secondary | ICD-10-CM | POA: Insufficient documentation

## 2023-01-30 DIAGNOSIS — C50919 Malignant neoplasm of unspecified site of unspecified female breast: Secondary | ICD-10-CM | POA: Diagnosis not present

## 2023-01-30 LAB — CMP (CANCER CENTER ONLY)
ALT: 20 U/L (ref 0–44)
AST: 21 U/L (ref 15–41)
Albumin: 4.3 g/dL (ref 3.5–5.0)
Alkaline Phosphatase: 76 U/L (ref 38–126)
Anion gap: 10 (ref 5–15)
BUN: 8 mg/dL (ref 6–20)
CO2: 25 mmol/L (ref 22–32)
Calcium: 9.4 mg/dL (ref 8.9–10.3)
Chloride: 103 mmol/L (ref 98–111)
Creatinine: 0.65 mg/dL (ref 0.44–1.00)
GFR, Estimated: >60 mL/min (ref >60)
Glucose, Bld: 94 mg/dL (ref 70–99)
Potassium: 4.1 mmol/L (ref 3.5–5.1)
Sodium: 138 mmol/L (ref 135–145)
Total Bilirubin: 0.6 mg/dL (ref <1.2)
Total Protein: 8 g/dL (ref 6.5–8.1)

## 2023-01-30 LAB — CBC WITH DIFFERENTIAL (CANCER CENTER ONLY)
Abs Immature Granulocytes: 0.01 10*3/uL (ref 0.00–0.07)
Basophils Absolute: 0 10*3/uL (ref 0.0–0.1)
Basophils Relative: 0 %
Eosinophils Absolute: 0.1 10*3/uL (ref 0.0–0.5)
Eosinophils Relative: 2 %
HCT: 39.3 % (ref 36.0–46.0)
Hemoglobin: 12.9 g/dL (ref 12.0–15.0)
Immature Granulocytes: 0 %
Lymphocytes Relative: 47 %
Lymphs Abs: 1.5 10*3/uL (ref 0.7–4.0)
MCH: 31.1 pg (ref 26.0–34.0)
MCHC: 32.8 g/dL (ref 30.0–36.0)
MCV: 94.7 fL (ref 80.0–100.0)
Monocytes Absolute: 0.2 10*3/uL (ref 0.1–1.0)
Monocytes Relative: 6 %
Neutro Abs: 1.5 10*3/uL — ABNORMAL LOW (ref 1.7–7.7)
Neutrophils Relative %: 45 %
Platelet Count: 137 10*3/uL — ABNORMAL LOW (ref 150–400)
RBC: 4.15 MIL/uL (ref 3.87–5.11)
RDW: 15.5 % (ref 11.5–15.5)
WBC Count: 3.2 10*3/uL — ABNORMAL LOW (ref 4.0–10.5)
nRBC: 0 % (ref 0.0–0.2)

## 2023-01-30 MED ORDER — ENOXAPARIN SODIUM 60 MG/0.6ML IJ SOSY: 60.0000 mg | PREFILLED_SYRINGE | Freq: Two times a day (BID) | INTRAMUSCULAR | 0 refills | Status: DC

## 2023-01-30 NOTE — Progress Notes (Signed)
Hematology/Oncology Progress note [phone] [phone]       CHIEF COMPLAINTS/PURPOSE OF CONSULTATION:  Left triple negative breast cancer.   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  Images and pathology results were reviewed and discussed with patient.  MRI showed  6.7 cm of abnormal enhancement, predominantly in the lower-inner inferior left breast, with the ultrasound-guided biopsy marking clip centrally positioned within the enhancement. Suspicious for involvement by cancer, cT3 disease.  Patient desires mastectomy. Patient has no desire of preserving fertility.  Currently on neoadjuvant chemotherapy with Carboplatin, taxol Keytruda followed by [facility] Q3 weeks  Cycle 4 treatment was delayed due to bacteremia/medi port infection.  Plan to resume cycle 4 Carboplatin, taxol Keytruda in 2 weeks, after her medi port is placed.  Repeat blood culture.     Acute pulmonary embolism (HCC) Continue Eliquis 5mg  BID, recommend patient to switch to Lovenox 60mg  BID starting 3 days prior to her medi port procedure.  Hold off Levenox on day of procedure. Resume Eliquis after procedure.    IDA (iron deficiency anemia) Labs are reviewed and discussed with patient. Lab Results  Component Value Date   HGB 12.9 01/30/2023   TIBC 300 11/29/2022   IRONPCTSAT 21 11/29/2022   FERRITIN 186 11/29/2022   Hb is stable.   Orders Placed This Encounter  Procedures   CBC with Differential (Cancer Center Only)    Standing Status:   Future    Expected Date:   02/14/2023    Expiration Date:   02/14/2024   CMP (Cancer Center only)    Standing Status:   Future    Expected Date:   02/14/2023    Expiration Date:    02/14/2024   Follow-up per LOS  All questions were answered. The patient knows to call the clinic with any problems, questions or concerns.  [name], MD, PhD [facility] Hematology Oncology 01/30/2023    HISTORY OF PRESENTING ILLNESS:  Penny Gomez 37 y.o. female presents to establish care for left triple negative breast cancer I have reviewed her chart and materials related to her cancer extensively and collaborated history with the patient. Summary of oncologic history is as follows: Oncology History  Invasive carcinoma of breast (HCC)  07/24/2022 Mammogram   She noticed left breast mass for 1 month.  Bilateral diagnostic mammogram showed Suspicious palpable left breast mass 8 o'clock position. Cortically thickened left axillary lymph node   10/10/2022 Initial Diagnosis   Invasive carcinoma of breast (HCC)  10/02/2022  Left breast mass biopsy and left axillary lymph node biopsy.  Diagnosis 1. Breast, left, needle core biopsy, 8 o'clock 6 cmfn, heart clip - INVASIVE MAMMARY CARCINOMA, NO SPECIAL TYPE. - TUBULE FORMATION: SCORE 3 - NUCLEAR PLEOMORPHISM: SCORE 3 - MITOTIC COUNT: SCORE 3 - TOTAL SCORE: 9 - OVERALL GRADE: 3 - LYMPHOVASCULAR INVASION: NOT IDENTIFIED - CANCER LENGTH: 11 MM - CALCIFICATIONS: NOT IDENTIFIED - DUCTAL CARCINOMA IN SITU: PRESENT, HIGH-GRADE - ER-, PR- HER2 - [IHC 1+] 2. Lymph node, needle/core biopsy, left axillary, hydromark - LYMPH NODE WITH REACTIVE CHANGES; NEGATIVE FOR MALIGNANCY.  Menarche at age of 39 First live birth at age of 2 OCP use: no History of hysterectomy: no Menopausal status: premenopausal History  of HRT use: no History of chest radiation: no Number of previous breast biopsies:  right breast biopsy 05/20/2019 negative for malignancy Strong family history of cancer mother breast cancer diagnosed in 55s, maternal aunts x 2 breast cancer, maternal cousins breast cancer x 2, maternal grandmother pancreatic cancer.    10/10/2022  Cancer Staging   Staging form: Breast, AJCC 8th Edition - Clinical stage from 10/10/2022: Stage IIIB (cT3, cN0, cM0, G3, ER-, PR-, HER2-) - Signed by [name], MD on 10/18/2022 Stage prefix: Initial diagnosis Nuclear grade: G3 Histologic grading system: 3 grade system   10/17/2022 Imaging   Bilateral MRI breasts w wo contrast  1. In total, there is approximately 6.7 cm of abnormal enhancement, predominantly in the lower-inner inferior left breast, with the ultrasound-guided biopsy marking clip centrally positioned within the enhancement. There are scattered small enhancing masses in the lower inner and lower outer anterior left (included in the measurement above).   2.  No evidence of right breast malignancy.   3. The axillary lymph nodes are not included within the field of view of this MRI for adequate assessment of lymphadenopathy.    10/17/2022 Procedure   S/p medi port placement.    10/25/2022 - 10/25/2022 Chemotherapy   Patient is on Treatment Plan : BREAST ADJUVANT DOSE DENSE AC q14d / PACLitaxel q7d     10/25/2022 -  Chemotherapy   Patient is on Treatment Plan : BREAST Pembrolizumab (200) D1 + Carboplatin (5) D1 + Paclitaxel (80) D1,8,15 q21d X 4 cycles / Pembrolizumab (200) D1 + AC D1 q21d x 4 cycles      Genetic Testing   Single pathogenic variant in BRCA1 called c.190T>G identified on the [facility] Multi-Cancer+RNA panel. The remainder of testing was normal. The report date is 11/11/2022.  The Multi-Cancer + RNA Panel offered by [facility] includes sequencing and/or deletion/duplication analysis of the following 70 genes:  AIP*, ALK, APC*, ATM*, AXIN2*, BAP1*, BARD1*, BLM*, BMPR1A*, BRCA1*, BRCA2*, BRIP1*, CDC73*, CDH1*, CDK4, CDKN1B*, CDKN2A, CHEK2*, CTNNA1*, DICER1*, EPCAM, EGFR, FH*, FLCN*, GREM1, HOXB13, KIT, LZTR1, MAX*, MBD4, MEN1*, MET, MITF, MLH1*, MSH2*, MSH3*, MSH6*, MUTYH*, NF1*, NF2*, NTHL1*, PALB2*, PDGFRA, PMS2*, POLD1*, POLE*, POT1*, PRKAR1A*, PTCH1*, PTEN*, RAD51C*, RAD51D*,  RB1*, RET, SDHA*, SDHAF2*, SDHB*, SDHC*, SDHD*, SMAD4*, SMARCA4*, SMARCB1*, SMARCE1*, STK11*, SUFU*, TMEM127*, TP53*, TSC1*, TSC2*, VHL*. RNA analysis is performed for * genes.   11/24/2022 - 11/25/2022 Hospital Admission   Admitted due to acute pulmonary embolism, lower extremity US negative for DVT.  Patietn was started on heparin gtt and transitioned to Eliquis at discharge.     Today she presents for evaluation prior to chemotherapy She tolerates chemotherapy.  On Eliquis 5mg  BID for PE. She tolerates well.  Today she reports headache since last night, "swimmy headed", nauseated.  She had fever of 101.2 in the clinic.  She has mild runny nose, mild cough. No sore throat. She feels cold. She denies dysuria or urgency. No concerns of her medi port.     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)    Microcytic anemia    MSSA bacteremia    Multifocal pneumonia    Sepsis (HCC)    due to port a cath   STD (sexually transmitted disease)    From Medical Hx;  Thrombocytopenia (HCC)    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   Vascular port complication     SURGICAL HISTORY: Past Surgical History:  Procedure Laterality Date   BREAST BIOPSY Right 05/20/2019   Affirm Bx- X- clip, neg   BREAST BIOPSY Left 10/02/2022   [place] Bx, path pending   BREAST BIOPSY Left 10/02/2022   [place] Bx Node- path pending   BREAST BIOPSY Left 10/02/2022   [place] LT BREAST BX W LOC DEV 1ST LESION IMG BX SPEC US GUIDE 10/02/2022 [facility]   CESAREAN SECTION     LAPAROSCOPIC BILATERAL SALPINGECTOMY Bilateral 02/09/2022   Procedure: LAPAROSCOPIC BILATERAL SALPINGECTOMY;  Surgeon: Hildred Laser, MD;  Location: [facility];  Service: Gynecology;  Laterality: Bilateral;   PORTACATH PLACEMENT Right 10/17/2022    Procedure: INSERTION PORT-A-CATH;  Surgeon: Henrene Dodge, MD;  Location: [facility];  Service: General;  Laterality: Right;   TEE WITHOUT CARDIOVERSION N/A 01/02/2023   Procedure: TRANSESOPHAGEAL ECHOCARDIOGRAM (TEE);  Surgeon: Antonieta Iba, MD;  Location: [facility];  Service: Cardiovascular;  Laterality: N/A;   TUBAL LIGATION      SOCIAL HISTORY: Social History   Socioeconomic History   Marital status: Single    Spouse name: Not on file   Number of children: 2   Years of education: Not on file   Highest education level: High school graduate  Occupational History   Not on file  Tobacco Use   Smoking status: Never    Passive exposure: Never   Smokeless tobacco: Never  Vaping Use   Vaping status: Never Used  Substance and Sexual Activity   Alcohol use: Yes    Comment: occ   Drug use: No   Sexual activity: Yes    Birth control/protection: Surgical  Other Topics Concern   Not on file  Social History Narrative   Not on file   Social Drivers of Health   Financial Resource Strain: Low Risk  (01/20/2023)   Received from [facility]   Overall Financial Resource Strain (CARDIA)    Difficulty of Paying Living Expenses: Not hard at all  Food Insecurity: No Food Insecurity (01/20/2023)   Received [facility]   Hunger Vital Sign    Worried About Running Out of Food in the Last Year: Never true    Ran Out of Food in the Last Year: Never true  Transportation Needs: No Transportation Needs (01/20/2023)   Received from [facility] - Transportation    In the past 12 months, has lack of transportation kept you from medical appointments or from getting medications?: No    Lack of Transportation (Non-Medical): No  Physical Activity: Not on file  Stress: No Stress Concern Present (10/10/2022)   [employer] of Occupational Health - Occupational Stress Questionnaire    Feeling of Stress : Only a little  Social  Connections: Not on file  Intimate Partner Violence: Not At Risk (12/31/2022)   Humiliation, Afraid, Rape, and Kick questionnaire    Fear of Current or Ex-Partner: No    Emotionally Abused: No    Physically Abused: No    Sexually Abused: No    FAMILY HISTORY: Family History  Problem Relation Age of Onset   Breast cancer Mother 81       no GT   Breast cancer Maternal Aunt 33   Breast cancer Maternal Aunt    Lung cancer Maternal Uncle    Lung cancer Maternal Uncle  Bone cancer Paternal Aunt    Pancreatic cancer Maternal Grandmother 66   Lung cancer Maternal Grandfather 80   Breast cancer Cousin 40   Breast cancer Other 33       gene pos?    ALLERGIES:  has no known allergies.  MEDICATIONS:  Current Outpatient Medications  Medication Sig Dispense Refill   acetaminophen (TYLENOL) 500 MG tablet Take 2 tablets (1,000 mg total) by mouth every 6 (six) hours as needed for mild pain.     apixaban (ELIQUIS) 5 MG TABS tablet Take 1 tablet (5 mg total) by mouth 2 (two) times daily. Start AFTER completing your starter pack 60 tablet 1   enoxaparin (LOVENOX) 60 MG/0.6ML injection Inject 0.6 mLs (60 mg total) into the skin every 12 (twelve) hours. Switch to Lovenox 3 days prior to surgery 3.6 mL 0   senna (SENOKOT) 8.6 MG TABS tablet Take 2 tablets (17.2 mg total) by mouth daily. 120 tablet 2   simethicone (MYLICON) 80 MG chewable tablet Chew 1 tablet (80 mg total) by mouth every 6 (six) hours as needed for flatulence. 30 tablet 2   lidocaine-prilocaine (EMLA) cream Apply 1 Application topically as needed. (Patient not taking: Reported on 01/27/2023) 30 g 11   linezolid (ZYVOX) 600 MG tablet Take 1 tablet (600 mg total) by mouth every 12 (twelve) hours for 14 days. (Patient not taking: Reported on 01/27/2023)     No current facility-administered medications for this visit.    Review of Systems  Constitutional:  Positive for fever. Negative for appetite change, chills and fatigue.  HENT:    Negative for hearing loss and voice change.   Eyes:  Negative for eye problems.  Respiratory:  Negative for chest tightness and cough.   Cardiovascular:  Negative for chest pain.  Gastrointestinal:  Negative for abdominal distention, abdominal pain and blood in stool.  Endocrine: Negative for hot flashes.  Genitourinary:  Positive for menstrual problem. Negative for difficulty urinating and frequency.   Musculoskeletal:  Negative for arthralgias.  Skin:  Negative for itching and rash.  Neurological:  Positive for headaches. Negative for extremity weakness.  Hematological:  Negative for adenopathy.  Psychiatric/Behavioral:  Negative for confusion.      PHYSICAL EXAMINATION: ECOG PERFORMANCE STATUS: 0 - Asymptomatic  Vitals:   01/30/23 0836  BP: 109/77  Pulse: 61  Resp: 18  Temp: (!) 96.1 F (35.6 C)  SpO2: 100%   Filed Weights   01/30/23 0836  Weight: 144 lb 14.4 oz (65.7 kg)    Physical Exam Constitutional:      General: She is not in acute distress.    Appearance: She is not diaphoretic.  HENT:     Head: Normocephalic and atraumatic.  Eyes:     General: No scleral icterus. Cardiovascular:     Rate and Rhythm: Normal rate and regular rhythm.  Pulmonary:     Effort: Pulmonary effort is normal. No respiratory distress.     Breath sounds: No wheezing.  Abdominal:     General: Bowel sounds are normal. There is no distension.     Palpations: Abdomen is soft.  Musculoskeletal:        General: Normal range of motion.     Cervical back: Normal range of motion and neck supple.  Skin:    General: Skin is warm and dry.     Findings: No erythema.  Neurological:     Mental Status: She is alert and oriented to person, place, and time. Mental status is  at baseline.     Motor: No abnormal muscle tone.  Psychiatric:        Mood and Affect: Mood and affect normal.     LABORATORY DATA:  I have reviewed the data as listed    Latest Ref Rng & Units 01/30/2023    8:30 AM  01/23/2023   11:32 AM 01/05/2023    3:21 AM  CBC  WBC 4.0 - 10.5 K/uL 3.2  4.4  7.9   Hemoglobin 12.0 - 15.0 g/dL 40.9  81.1  9.2   Hematocrit 36.0 - 46.0 % 39.3  38.8  28.2   Platelets 150 - 400 K/uL 137  190  142       Latest Ref Rng & Units 01/30/2023    8:30 AM 01/23/2023   11:32 AM 01/05/2023    3:21 AM  CMP  Glucose 70 - 99 mg/dL 94  89  914   BUN 6 - 20 mg/dL 8  14  9    Creatinine 0.44 - 1.00 mg/dL 7.82  9.56  2.13   Sodium 135 - 145 mmol/L 138  137  137   Potassium 3.5 - 5.1 mmol/L 4.1  4.3  4.4   Chloride 98 - 111 mmol/L 103  105  103   CO2 22 - 32 mmol/L 25  26  27    Calcium 8.9 - 10.3 mg/dL 9.4  8.9  9.0   Total Protein 6.5 - 8.1 g/dL 8.0  7.1    Total Bilirubin <1.2 mg/dL 0.6  0.4    Alkaline Phos 38 - 126 U/L 76  71    AST 15 - 41 U/L 21  17    ALT 0 - 44 U/L 20  15       RADIOGRAPHIC STUDIES: I have personally reviewed the radiological images as listed and agreed with the findings in the report. [place] EKG SITE RITE Result Date: 01/05/2023 If Site Rite image not attached, placement could not be confirmed due to current cardiac rhythm.  DG Chest Port 1 View Result Date: 01/02/2023 CLINICAL DATA:  Left-sided chest and back pain, initial encounter EXAM: PORTABLE CHEST 1 VIEW COMPARISON:  12/30/2022 FINDINGS: Cardiac shadow is within normal limits. The lungs are hypoinflated. No focal infiltrate is seen. Scattered large and small bowel gas is noted. No bony abnormality is seen. Right chest wall port has been removed. IMPRESSION: No acute abnormality noted. Electronically Signed   By: Alcide Clever M.D.   On: 01/02/2023 22:58   ECHO TEE Result Date: 01/02/2023    TRANSESOPHOGEAL ECHO REPORT   Patient Name:   Penny Gomez Date of Exam: 01/02/2023 Medical Rec #:  [id number]       Height:       60.0 in Accession #:    [id number]      Weight:       142.6 lb Date of Birth:  Aug 21, 1985        BSA:          1.617 m Patient Age:    37 years        BP:           116/75 mmHg  Patient Gender: F               HR:           106 bpm. Exam Location:  [facility] Procedure: Transesophageal Echo, Cardiac Doppler, Color Doppler and Saline            Contrast Bubble  Study Indications:     Bacteremia R78.81  History:         Patient has prior history of Echocardiogram examinations, most                  recent 12/31/2022. Breast cancer.  Sonographer:     Cristela Blue Referring Phys:  [id number] CHRISTOPHER RONALD BERGE Diagnosing Phys: Julien Nordmann MD PROCEDURE: After discussion of the risks and benefits of a TEE, an informed consent was obtained from the patient. TEE procedure time was 30 minutes. The transesophogeal probe was passed without difficulty through the esophogus of the patient. Imaged were obtained with the patient in a left lateral decubitus position. Local oropharyngeal anesthetic was provided with Cetacaine and viscous lidocaine. Sedation performed by different physician. Image quality was excellent. The patient's vital signs; including heart rate, blood pressure, and oxygen saturation; remained stable throughout the procedure. The patient developed no complications during the procedure.  IMPRESSIONS  1. No valve vegetation noted  2. Left ventricular ejection fraction, by estimation, is 60 to 65%. The left ventricle has normal function. The left ventricle has no regional wall motion abnormalities.  3. Right ventricular systolic function is normal. The right ventricular size is normal.  4. No left atrial/left atrial appendage thrombus was detected.  5. The mitral valve is normal in structure. No evidence of mitral valve regurgitation. No evidence of mitral stenosis.  6. The aortic valve is normal in structure. Aortic valve regurgitation is not visualized. No aortic stenosis is present.  7. The inferior vena cava is normal in size with greater than 50% respiratory variability, suggesting right atrial pressure of 3 mmHg.  8. Agitated saline contrast bubble study was negative, with no evidence of  any interatrial shunt. Conclusion(s)/Recommendation(s): Normal biventricular function without evidence of hemodynamically significant valvular heart disease. FINDINGS  Left Ventricle: Left ventricular ejection fraction, by estimation, is 60 to 65%. The left ventricle has normal function. The left ventricle has no regional wall motion abnormalities. The left ventricular internal cavity size was normal in size. There is  no left ventricular hypertrophy. Right Ventricle: The right ventricular size is normal. No increase in right ventricular wall thickness. Right ventricular systolic function is normal. Left Atrium: Left atrial size was normal in size. No left atrial/left atrial appendage thrombus was detected. Right Atrium: Right atrial size was normal in size. Pericardium: There is no evidence of pericardial effusion. Mitral Valve: The mitral valve is normal in structure. No evidence of mitral valve regurgitation. No evidence of mitral valve stenosis. There is no evidence of mitral valve vegetation. Tricuspid Valve: The tricuspid valve is normal in structure. Tricuspid valve regurgitation is not demonstrated. No evidence of tricuspid stenosis. There is no evidence of tricuspid valve vegetation. Aortic Valve: The aortic valve is normal in structure. Aortic valve regurgitation is not visualized. No aortic stenosis is present. There is no evidence of aortic valve vegetation. Pulmonic Valve: The pulmonic valve was normal in structure. Pulmonic valve regurgitation is not visualized. No evidence of pulmonic stenosis. There is no evidence of pulmonic valve vegetation. Aorta: The aortic root is normal in size and structure. Venous: The inferior vena cava is normal in size with greater than 50% respiratory variability, suggesting right atrial pressure of 3 mmHg. IAS/Shunts: No atrial level shunt detected by color flow Doppler. Agitated saline contrast was given intravenously to evaluate for intracardiac shunting. Agitated  saline contrast bubble study was negative, with no evidence of any interatrial shunt. There  is no evidence of  a patent foramen ovale. There is no evidence of an atrial septal defect. Julien Nordmann MD Electronically signed by Julien Nordmann MD Signature Date/Time: 01/02/2023/4:40:37 PM    Final    ECHOCARDIOGRAM LIMITED Result Date: 01/01/2023    ECHOCARDIOGRAM LIMITED REPORT   Patient Name:   Penny Gomez Date of Exam: 12/31/2022 Medical Rec #:  [id number]       Height:       60.0 in Accession #:    [id number]      Weight:       141.8 lb Date of Birth:  Nov 24, 1985        BSA:          1.613 m Patient Age:    37 years        BP:           122/63 mmHg Patient Gender: F               HR:           113 bpm. Exam Location:  [facility] Procedure: 2D Echo, Limited Echo, Limited Color Doppler, 3D Echo and Strain            Analysis Indications:     R78.81 Bacteremia.  History:         Patient has prior history of Echocardiogram examinations, most                  recent 10/17/2022. Breast cancer.  Sonographer:     Daphine Deutscher RDCS Referring Phys:  [id number] T VU Diagnosing Phys: Julien Nordmann MD IMPRESSIONS  1. Left ventricular ejection fraction, by estimation, is 60 to 65%. The left ventricle has normal function. The left ventricle has no regional wall motion abnormalities. Left ventricular diastolic parameters were normal.  2. Right ventricular systolic function is normal. The right ventricular size is normal. Tricuspid regurgitation signal is inadequate for assessing PA pressure.  3. The mitral valve is normal in structure. Mild mitral valve regurgitation. No evidence of mitral stenosis.  4. The aortic valve is normal in structure. Aortic valve regurgitation is not visualized. No aortic stenosis is present.  5. The inferior vena cava is normal in size with greater than 50% respiratory variability, suggesting right atrial pressure of 3 mmHg. FINDINGS  Left Ventricle: Left ventricular ejection fraction, by  estimation, is 60 to 65%. The left ventricle has normal function. The left ventricle has no regional wall motion abnormalities. The left ventricular internal cavity size was normal in size. There is  no left ventricular hypertrophy. Left ventricular diastolic parameters were normal. Right Ventricle: The right ventricular size is normal. No increase in right ventricular wall thickness. Right ventricular systolic function is normal. Tricuspid regurgitation signal is inadequate for assessing PA pressure. Left Atrium: Left atrial size was normal in size. Right Atrium: Right atrial size was normal in size. Pericardium: There is no evidence of pericardial effusion. Mitral Valve: The mitral valve is normal in structure. Mild mitral valve regurgitation. No evidence of mitral valve stenosis. Tricuspid Valve: The tricuspid valve is normal in structure. Tricuspid valve regurgitation is not demonstrated. No evidence of tricuspid stenosis. Aortic Valve: The aortic valve is normal in structure. Aortic valve regurgitation is not visualized. No aortic stenosis is present. Pulmonic Valve: The pulmonic valve was normal in structure. Pulmonic valve regurgitation is not visualized. No evidence of pulmonic stenosis. Aorta: The aortic root is normal in size and structure. Venous: The inferior vena cava is normal in size with greater  than 50% respiratory variability, suggesting right atrial pressure of 3 mmHg. IAS/Shunts: No atrial level shunt detected by color flow Doppler. LEFT VENTRICLE PLAX 2D LVIDd:         4.20 cm   Diastology LVIDs:         2.20 cm   LV e' medial:    12.23 cm/s LV PW:         0.90 cm   LV E/e' medial:  8.3 LV IVS:        0.90 cm   LV e' lateral:   15.35 cm/s LVOT diam:     2.10 cm   LV E/e' lateral: 6.6 LV SV:         64 LV SV Index:   40        2D Longitudinal Strain LVOT Area:     3.46 cm  2D Strain GLS (A2C):   -24.5 %                          2D Strain GLS (A3C):   -24.0 %                          2D Strain  GLS (A4C):   -24.5 %                          2D Strain GLS Avg:     -24.3 %                           3D Volume EF:                          3D EF:        56 %                          LV EDV:       111 ml                          LV ESV:       48 ml                          LV SV:        63 ml RIGHT VENTRICLE RV Basal diam:  3.70 cm RV S prime:     20.90 cm/s TAPSE (M-mode): 2.8 cm LEFT ATRIUM             Index        RIGHT ATRIUM           Index LA diam:        4.20 cm 2.60 cm/m   RA Area:     15.10 cm LA Vol (A2C):   18.2 ml 11.29 ml/m  RA Volume:   43.30 ml  26.85 ml/m LA Vol (A4C):   23.7 ml 14.70 ml/m LA Biplane Vol: 21.3 ml 13.21 ml/m  AORTIC VALVE LVOT Vmax:   129.33 cm/s LVOT Vmean:  86.800 cm/s LVOT VTI:    0.185 m  AORTA Ao Root diam: 2.90 cm Ao Asc diam:  2.80 cm MITRAL VALVE MV Area (PHT): 5.66 cm     SHUNTS MV Decel Time: 134 msec     Systemic  VTI:  0.18 m MV E velocity: 101.50 cm/s  Systemic Diam: 2.10 cm MV A velocity: 97.80 cm/s MV E/A ratio:  1.04 Julien Nordmann MD Electronically signed by Julien Nordmann MD Signature Date/Time: 01/01/2023/3:27:39 PM    Final

## 2023-01-30 NOTE — Assessment & Plan Note (Signed)
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  Images and pathology results were reviewed and discussed with patient.  MRI showed  6.7 cm of abnormal enhancement, predominantly in the lower-inner inferior left breast, with the ultrasound-guided biopsy marking clip centrally positioned within the enhancement. Suspicious for involvement by cancer, cT3 disease.  Patient desires mastectomy. Patient has no desire of preserving fertility.  Currently on neoadjuvant chemotherapy with Carboplatin, taxol Keytruda followed by [facility] Q3 weeks  Cycle 4 treatment was delayed due to bacteremia/medi port infection.  Plan to resume cycle 4 Carboplatin, taxol Keytruda in 2 weeks, after her medi port is placed.  Repeat blood culture.

## 2023-01-30 NOTE — Assessment & Plan Note (Signed)
Labs are reviewed and discussed with patient. Lab Results  Component Value Date   HGB 12.9 01/30/2023   TIBC 300 11/29/2022   IRONPCTSAT 21 11/29/2022   FERRITIN 186 11/29/2022   Hb is stable.

## 2023-01-30 NOTE — Assessment & Plan Note (Signed)
Continue Eliquis 5mg  BID, recommend patient to switch to Lovenox 60mg  BID starting 3 days prior to her medi port procedure.  Hold off Levenox on day of procedure. Resume Eliquis after procedure.

## 2023-01-31 ENCOUNTER — Other Ambulatory Visit: Payer: Self-pay

## 2023-02-05 LAB — CULTURE, BLOOD (ROUTINE X 2)
Culture: NO GROWTH
Culture: NO GROWTH
Special Requests: ADEQUATE

## 2023-02-06 ENCOUNTER — Encounter: Payer: Self-pay | Admitting: Oncology

## 2023-02-10 ENCOUNTER — Ambulatory Visit: Payer: Medicaid Other

## 2023-02-10 ENCOUNTER — Other Ambulatory Visit: Payer: Self-pay

## 2023-02-10 ENCOUNTER — Encounter
Admission: RE | Disposition: A | Discharge status: Home / Self Care | Payer: Self-pay | Source: Home / Self Care | Attending: Surgery

## 2023-02-10 ENCOUNTER — Ambulatory Visit: Payer: Medicaid Other | Admitting: Certified Registered"

## 2023-02-10 ENCOUNTER — Ambulatory Visit
Admission: RE | Admit: 2023-02-10 | Discharge: 2023-02-10 | Disposition: A | Discharge status: Home / Self Care | Payer: Medicaid Other | Attending: Surgery | Admitting: Surgery

## 2023-02-10 ENCOUNTER — Encounter: Payer: Self-pay | Admitting: Surgery

## 2023-02-10 DIAGNOSIS — Z01818 Encounter for other preprocedural examination: Secondary | ICD-10-CM

## 2023-02-10 DIAGNOSIS — C50919 Malignant neoplasm of unspecified site of unspecified female breast: Secondary | ICD-10-CM | POA: Insufficient documentation

## 2023-02-10 HISTORY — PX: PORTACATH PLACEMENT: SHX2246

## 2023-02-10 LAB — POCT PREGNANCY, URINE: Preg Test, Ur: NEGATIVE

## 2023-02-10 SURGERY — INSERTION, TUNNELED CENTRAL VENOUS DEVICE, WITH PORT
Anesthesia: General | Site: Chest | Laterality: Left

## 2023-02-10 MED ORDER — PROPOFOL 10 MG/ML IV BOLUS
INTRAVENOUS | Status: DC | PRN
  Administered 2023-02-10: 20 mg via INTRAVENOUS
  Administered 2023-02-10: 150 mg via INTRAVENOUS
  Administered 2023-02-10: 50 mg via INTRAVENOUS
  Administered 2023-02-10: 25 mg via INTRAVENOUS

## 2023-02-10 MED ORDER — CHLORHEXIDINE GLUCONATE CLOTH 2 % EX PADS
6.0000 | MEDICATED_PAD | Freq: Once | CUTANEOUS | Status: AC
  Administered 2023-02-10: 6 via TOPICAL

## 2023-02-10 MED ORDER — LIDOCAINE-EPINEPHRINE (PF) 1 %-1:200000 IJ SOLN
INTRAMUSCULAR | Status: AC
  Filled 2023-02-10: qty 30

## 2023-02-10 MED ORDER — LACTATED RINGERS IV SOLN: INTRAVENOUS | Status: DC

## 2023-02-10 MED ORDER — LIDOCAINE HCL (CARDIAC) PF 100 MG/5ML IV SOSY
PREFILLED_SYRINGE | INTRAVENOUS | Status: DC | PRN
  Administered 2023-02-10: 70 mg via INTRAVENOUS

## 2023-02-10 MED ORDER — ACETAMINOPHEN 10 MG/ML IV SOLN: 1000.0000 mg | Freq: Once | INTRAVENOUS | Status: DC | PRN

## 2023-02-10 MED ORDER — CHLORHEXIDINE GLUCONATE 0.12 % MT SOLN
OROMUCOSAL | Status: AC
  Filled 2023-02-10: qty 15

## 2023-02-10 MED ORDER — HYDROCODONE-ACETAMINOPHEN 5-325 MG PO TABS: 1.0000 | ORAL_TABLET | Freq: Four times a day (QID) | ORAL | 0 refills | Status: DC | PRN

## 2023-02-10 MED ORDER — FENTANYL CITRATE (PF) 100 MCG/2ML IJ SOLN
INTRAMUSCULAR | Status: AC
  Filled 2023-02-10: qty 2

## 2023-02-10 MED ORDER — MIDAZOLAM HCL 2 MG/2ML IJ SOLN
INTRAMUSCULAR | Status: AC
  Filled 2023-02-10: qty 2

## 2023-02-10 MED ORDER — SODIUM CHLORIDE (PF) 0.9 % IJ SOLN
INTRAMUSCULAR | Status: DC | PRN
  Administered 2023-02-10: 20 mL via INTRAVENOUS

## 2023-02-10 MED ORDER — DEXMEDETOMIDINE HCL IN NACL 80 MCG/20ML IV SOLN
INTRAVENOUS | Status: AC
  Filled 2023-02-10: qty 20

## 2023-02-10 MED ORDER — FENTANYL CITRATE (PF) 100 MCG/2ML IJ SOLN
INTRAMUSCULAR | Status: DC | PRN
  Administered 2023-02-10 (×2): 25 ug via INTRAVENOUS

## 2023-02-10 MED ORDER — BUPIVACAINE HCL (PF) 0.5 % IJ SOLN
INTRAMUSCULAR | Status: AC
  Filled 2023-02-10: qty 30

## 2023-02-10 MED ORDER — DOCUSATE SODIUM 100 MG PO CAPS: 100.0000 mg | ORAL_CAPSULE | Freq: Two times a day (BID) | ORAL | 0 refills | Status: AC | PRN

## 2023-02-10 MED ORDER — MIDAZOLAM HCL 2 MG/2ML IJ SOLN
INTRAMUSCULAR | Status: DC | PRN
  Administered 2023-02-10: 2 mg via INTRAVENOUS

## 2023-02-10 MED ORDER — HEPARIN SOD (PORK) LOCK FLUSH 100 UNIT/ML IV SOLN
INTRAVENOUS | Status: DC | PRN
  Administered 2023-02-10: 500 [IU] via INTRAVENOUS

## 2023-02-10 MED ORDER — OXYCODONE HCL 5 MG/5ML PO SOLN: 5.0000 mg | Freq: Once | ORAL | Status: DC | PRN

## 2023-02-10 MED ORDER — CEFAZOLIN SODIUM-DEXTROSE 2-4 GM/100ML-% IV SOLN
2.0000 g | INTRAVENOUS | Status: AC
  Administered 2023-02-10: 2 g via INTRAVENOUS

## 2023-02-10 MED ORDER — ONDANSETRON HCL 4 MG/2ML IJ SOLN
INTRAMUSCULAR | Status: DC | PRN
  Administered 2023-02-10: 4 mg via INTRAVENOUS

## 2023-02-10 MED ORDER — LIDOCAINE HCL (PF) 2 % IJ SOLN
INTRAMUSCULAR | Status: AC
  Filled 2023-02-10: qty 15

## 2023-02-10 MED ORDER — DEXAMETHASONE SODIUM PHOSPHATE 10 MG/ML IJ SOLN
INTRAMUSCULAR | Status: DC | PRN
  Administered 2023-02-10: 10 mg via INTRAVENOUS

## 2023-02-10 MED ORDER — ROCURONIUM BROMIDE 10 MG/ML (PF) SYRINGE
PREFILLED_SYRINGE | INTRAVENOUS | Status: AC
  Filled 2023-02-10: qty 10

## 2023-02-10 MED ORDER — HEPARIN SOD (PORK) LOCK FLUSH 100 UNIT/ML IV SOLN
INTRAVENOUS | Status: AC
  Filled 2023-02-10: qty 5

## 2023-02-10 MED ORDER — ONDANSETRON HCL 4 MG/2ML IJ SOLN
INTRAMUSCULAR | Status: AC
  Filled 2023-02-10: qty 6

## 2023-02-10 MED ORDER — CEFAZOLIN SODIUM-DEXTROSE 2-4 GM/100ML-% IV SOLN
INTRAVENOUS | Status: AC
  Filled 2023-02-10: qty 100

## 2023-02-10 MED ORDER — CHLORHEXIDINE GLUCONATE 0.12 % MT SOLN
15.0000 mL | Freq: Once | OROMUCOSAL | Status: AC
  Administered 2023-02-10: 15 mL via OROMUCOSAL

## 2023-02-10 MED ORDER — LIDOCAINE-EPINEPHRINE (PF) 1 %-1:200000 IJ SOLN
INTRAMUSCULAR | Status: DC | PRN
  Administered 2023-02-10: 13 mL via INTRAMUSCULAR

## 2023-02-10 MED ORDER — OXYCODONE HCL 5 MG PO TABS: 5.0000 mg | ORAL_TABLET | Freq: Once | ORAL | Status: DC | PRN

## 2023-02-10 MED ORDER — PROPOFOL 10 MG/ML IV BOLUS
INTRAVENOUS | Status: AC
  Filled 2023-02-10: qty 20

## 2023-02-10 MED ORDER — ONDANSETRON HCL 4 MG/2ML IJ SOLN
4.0000 mg | Freq: Once | INTRAMUSCULAR | Status: DC | PRN
Start: 2023-02-10 — End: 2023-02-10

## 2023-02-10 MED ORDER — DEXAMETHASONE SODIUM PHOSPHATE 10 MG/ML IJ SOLN
INTRAMUSCULAR | Status: AC
  Filled 2023-02-10: qty 4

## 2023-02-10 MED ORDER — ORAL CARE MOUTH RINSE: 15.0000 mL | Freq: Once | OROMUCOSAL | Status: AC

## 2023-02-10 MED ORDER — FENTANYL CITRATE (PF) 100 MCG/2ML IJ SOLN
25.0000 ug | INTRAMUSCULAR | Status: DC | PRN
Start: 2023-02-10 — End: 2023-02-10

## 2023-02-10 SURGICAL SUPPLY — 28 items
BAG DECANTER FOR FLEXI CONT (MISCELLANEOUS) ×1 IMPLANT
BLADE SURG SZ11 CARB STEEL (BLADE) ×1 IMPLANT
CHLORAPREP W/TINT 26 (MISCELLANEOUS) ×1 IMPLANT
CLAMP SUTURE YELLOW 5 PAIRS (MISCELLANEOUS) ×1 IMPLANT
COVER LIGHT HANDLE STERIS (MISCELLANEOUS) ×2 IMPLANT
DERMABOND ADVANCED .7 DNX12 (GAUZE/BANDAGES/DRESSINGS) IMPLANT
DRAPE C-ARM XRAY 36X54 (DRAPES) ×1 IMPLANT
ELECT CAUTERY BLADE 6.4 (BLADE) ×1 IMPLANT
ELECT REM PT RETURN 9FT ADLT (ELECTROSURGICAL) ×1
ELECTRODE REM PT RTRN 9FT ADLT (ELECTROSURGICAL) ×1 IMPLANT
GAUZE 4X4 16PLY ~~LOC~~+RFID DBL (SPONGE) ×1 IMPLANT
GLOVE BIOGEL PI IND STRL 7.0 (GLOVE) ×1 IMPLANT
GLOVE SURG SYN 6.5 ES PF (GLOVE) ×1 IMPLANT
GLOVE SURG SYN 6.5 PF PI (GLOVE) ×1 IMPLANT
GOWN STRL REUS W/ TWL LRG LVL3 (GOWN DISPOSABLE) ×2 IMPLANT
IV NS 500ML BAXH (IV SOLUTION) ×1 IMPLANT
KIT PORT INFUSION SMART 8FR (Port) ×1 IMPLANT
KIT TURNOVER KIT A (KITS) ×1 IMPLANT
LABEL OR SOLS (LABEL) ×1 IMPLANT
MANIFOLD NEPTUNE II (INSTRUMENTS) ×1 IMPLANT
PACK PORT-A-CATH (MISCELLANEOUS) ×1 IMPLANT
SPONGE VERSALON 4X4 4PLY (MISCELLANEOUS) ×1 IMPLANT
SUT MNCRL AB 4-0 PS2 18 (SUTURE) ×1 IMPLANT
SUT PROLENE 2 0 SH DA (SUTURE) ×1 IMPLANT
SUT VIC AB 3-0 SH 27X BRD (SUTURE) ×1 IMPLANT
SYR 10ML LL (SYRINGE) ×1 IMPLANT
TAG SUTURE CLAMP YLW 5PR (MISCELLANEOUS) ×1
TRAP FLUID SMOKE EVACUATOR (MISCELLANEOUS) ×1 IMPLANT

## 2023-02-10 NOTE — Interval H&P Note (Signed)
No change. Confirm blood culture is negative. OK to proceed.

## 2023-02-10 NOTE — Op Note (Signed)
--  OP NOTE  DATE OF PROCEDURE: 02/10/2023   SURGEON: Tonna Boehringer  ANESTHESIA: LMA  PRE-OPERATIVE DIAGNOSIS: BREAST cancer requring port for chemotherapy   POST-OPERATIVE DIAGNOSIS: same  PROCEDURE(S):  1.) Percutaneous access of left internal jugular vein under ultrasound guidance   2.) Insertion of tunneled left internal jugular central venous catheter with subcutaneous port  INTRAOPERATIVE FINDINGS: Patent easily compressible left internal jugular vein with appropriate respiratory variations and well-secured tunneled central venous catheter with subcutaneous port at completion of the procedure, heplocked after confirming ease of draw and push  ESTIMATED BLOOD LOSS: Minimal (<20 mL)   SPECIMENS: None   IMPLANTS: 49F tunneled Bard PowerPort central venous catheter with subcutaneous port  DRAINS: None   COMPLICATIONS: None apparent   CONDITION AT COMPLETION: Hemodynamically stable, awake   DISPOSITION: PACU   INDICATION(S) FOR PROCEDURE:  Patient is a 37 y.o. female who presented with above diagnosis.  All risks, benefits, and alternatives to above elective procedures were discussed with the patient, who elected to proceed, and informed consent was accordingly obtained at that time.  DETAILS OF PROCEDURE:  Patient was brought to the operative suite and appropriately identified. In Trendelenburg position, left neck and subclavian access site was prepped and draped in the usual sterile fashion, and following a timeout, attempt made to access subclavian x4 using landmarks, but unable to access.  Left internal jugular site then visualized with ultrasound and percutaneous venous access was obtained under ultrasound guidance after several attempts using Seldinger technique, and access needle was inserted under direct ultrasound visualization through which soft guidewire was advanced with some manipulation into atriocaval junction, over which access needle was withdrawn. Guidewire was secured,  attention was directed to injection of local anesthetic along the planned tunnel site, 2-3 cm transverse left chest incision was made and confirmed to accommodate the subcutaneous port, and flushed catheter was tunneled retrograde from the port site over to the vein access site. Insertion sheath was advanced over the guidewire under direct visualization, which was withdrawn along with the insertion sheath dilator with no resistance. The catheter was introduced through the sheath and sheath peeled away. Catheter noted to be slightly beyond atriocaval junction under fluoro, so pulled slightly but tip flipped and retracted more than expected.  Unable to push back into target spot so left it place at this point.   Fluro confirmed no kink within the entire length of the catheter at this point.Catheter connected to port and placed within planned fixation site. Easy flow and draw noted.   Port fixed to the pocket on one side and around catheter cuff to avoid twisting. Port again confirmed to withdraw blood and flush easily with included Heuber needle, and port heplocked. Dermis at the subcutaneous pocket was re-approximated using buried interrupted 3-0 Vicryl suture, and 4-0 Monocryl suture was used to re-approximate skin at the insertion and subcutaneous port sites in running subcuticular fashion.  Incisions then dressed with dermabond. Patient was then awakened from anesthesia and transferred to PACU in stable condition.  [place] images saved in paper chart and fluoro images saved in Epic.  CXR post op confirmed proper placement of port and no evidence of pneumothorax.

## 2023-02-10 NOTE — Anesthesia Preprocedure Evaluation (Addendum)
Anesthesia Evaluation  Patient identified by MRN, date of birth, ID band Patient awake    Reviewed: Allergy & Precautions, NPO status , Patient's Chart, lab work & pertinent test results  History of Anesthesia Complications Negative for: history of anesthetic complications  Airway Mallampati: III   Neck ROM: Full    Dental no notable dental hx.    Pulmonary neg pulmonary ROS   Pulmonary exam normal breath sounds clear to auscultation       Cardiovascular Exercise Tolerance: Good Normal cardiovascular exam Rhythm:Regular Rate:Normal  PE on Eliquis   Neuro/Psych negative neurological ROS     GI/Hepatic negative GI ROS,,,  Endo/Other  negative endocrine ROS    Renal/GU negative Renal ROS     Musculoskeletal   Abdominal   Peds  Hematology  (+) Blood dyscrasia, anemia Breast CA   Anesthesia Other Findings   Reproductive/Obstetrics Uterine fibroids                             Anesthesia Physical Anesthesia Plan  ASA: 3  Anesthesia Plan: General   Post-op Pain Management:    Induction: Intravenous  PONV Risk Score and Plan: 3 and Ondansetron, Dexamethasone and Treatment may vary due to age or medical condition  Airway Management Planned: LMA  Additional Equipment:   Intra-op Plan:   Post-operative Plan: Extubation in OR  Informed Consent: I have reviewed the patients History and Physical, chart, labs and discussed the procedure including the risks, benefits and alternatives for the proposed anesthesia with the patient or authorized representative who has indicated his/her understanding and acceptance.     Dental advisory given  Plan Discussed with: CRNA  Anesthesia Plan Comments: (Patient consented for risks of anesthesia including but not limited to:  - adverse reactions to medications - damage to eyes, teeth, lips or other oral mucosa - nerve damage due to positioning   - sore throat or hoarseness - damage to heart, brain, nerves, lungs, other parts of body or loss of life  Informed patient about role of CRNA in peri- and intra-operative care.  Patient voiced understanding.)       Anesthesia Quick Evaluation

## 2023-02-10 NOTE — Anesthesia Procedure Notes (Signed)
Procedure Name: LMA Insertion Date/Time: 02/10/2023 12:22 PM  Performed by: Lanell Matar, CRNAPre-anesthesia Checklist: Patient identified, Emergency Drugs available, Suction available and Patient being monitored Patient Re-evaluated:Patient Re-evaluated prior to induction Oxygen Delivery Method: Circle System Utilized Preoxygenation: Pre-oxygenation with 100% oxygen Induction Type: IV induction Ventilation: Mask ventilation without difficulty LMA: LMA inserted LMA Size: 4.0 Number of attempts: 1 Airway Equipment and Method: Bite block Placement Confirmation: positive ETCO2 Tube secured with: Tape Dental Injury: Teeth and Oropharynx as per pre-operative assessment

## 2023-02-10 NOTE — Transfer of Care (Signed)
Immediate Anesthesia Transfer of Care Note  Patient: Penny Gomez  Procedure(s) Performed: INSERTION PORT-A-CATH (Left: Chest)  Patient Location: PACU  Anesthesia Type:General  Level of Consciousness: awake, drowsy, and patient cooperative  Airway & Oxygen Therapy: Patient Spontanous Breathing  Post-op Assessment: Report given to RN, Post -op Vital signs reviewed and stable, and Patient moving all extremities X 4  Post vital signs: Reviewed and stable  Last Vitals:  Vitals Value Taken Time  BP 116/74 02/10/23 1340  Temp    Pulse 84 02/10/23 1342  Resp 13 02/10/23 1342  SpO2 100 % 02/10/23 1342  Vitals shown include unfiled device data.  Last Pain:  Vitals:   02/10/23 1113  TempSrc: Temporal  PainSc: 0-No pain         Complications: No notable events documented.

## 2023-02-10 NOTE — Discharge Instructions (Signed)
PORT , Care After This sheet gives you information about how to care for yourself after your procedure. Your health care provider may also give you more specific instructions. If you have problems or questions, contact your health care provider. What can I expect after the procedure? After the procedure, it is common to have: Soreness. Bruising. Itching. Follow these instructions at home: site care Follow instructions from your health care provider about how to take care of your site. Make sure you: Wash your hands with soap and water before and after you change your bandage (dressing). If soap and water are not available, use hand sanitizer. Leave stitches (sutures), skin glue, or adhesive strips in place. These skin closures may need to stay in place for 2 weeks or longer. If adhesive strip edges start to loosen and curl up, you may trim the loose edges. Do not remove adhesive strips completely unless your health care provider tells you to do that. If the area bleeds or bruises, apply gentle pressure for 10 minutes. OK TO SHOWER IN 48HRS  Check your site every day for signs of infection. Check for: Redness, swelling, or pain. Fluid or blood. Warmth. Pus or a bad smell.  General instructions Rest and then return to your normal activities as told by your health care provider.  tylenol as needed for discomfort.   RESUME ELIQUIS IN 48HRS  Use narcotics, if prescribed, only when tylenol and motrin is not enough to control pain.  325-650mg  every 8hrs to max of 3000mg /24hrs (including the 325mg  in every norco dose) for the tylenol.   Keep all follow-up visits as told by your health care provider. This is important. Contact a health care provider if: You have redness, swelling, or pain around your site. You have fluid or blood coming from your site. Your site feels warm to the touch. You have pus or a bad smell coming from your site. You have a fever. Your sutures, skin glue, or adhesive  strips loosen or come off sooner than expected. Get help right away if: You have bleeding that does not stop with pressure or a dressing. Summary After the procedure, it is common to have some soreness, bruising, and itching at the site. Follow instructions from your health care provider about how to take care of your site. Check your site every day for signs of infection. Contact a health care provider if you have redness, swelling, or pain around your site, or your site feels warm to the touch. Keep all follow-up visits as told by your health care provider. This is important. This information is not intended to replace advice given to you by your health care provider. Make sure you discuss any questions you have with your health care provider. Document Released: 02/24/2015 Document Revised: 07/28/2017 Document Reviewed: 07/28/2017 Elsevier Interactive Patient Education  [employer].

## 2023-02-10 NOTE — Anesthesia Postprocedure Evaluation (Signed)
Anesthesia Post Note  Patient: Penny Gomez  Procedure(s) Performed: INSERTION PORT-A-CATH (Left: Chest)  Patient location during evaluation: PACU Anesthesia Type: General Level of consciousness: awake and alert, oriented and patient cooperative Pain management: pain level controlled Vital Signs Assessment: post-procedure vital signs reviewed and stable Respiratory status: spontaneous breathing, nonlabored ventilation and respiratory function stable Cardiovascular status: blood pressure returned to baseline and stable Postop Assessment: adequate PO intake Anesthetic complications: no   No notable events documented.   Last Vitals:  Vitals:   02/10/23 1415 02/10/23 1427  BP: (!) 118/90 128/80  Pulse: (!) 55 64  Resp:  15  Temp: (!) 36.2 C (!) 36.2 C  SpO2: 100% 100%    Last Pain:  Vitals:   02/10/23 1427  TempSrc: Temporal  PainSc: 0-No pain                 Reed Breech

## 2023-02-11 ENCOUNTER — Ambulatory Visit: Payer: Medicaid Other | Admitting: Oncology

## 2023-02-11 ENCOUNTER — Ambulatory Visit: Payer: Medicaid Other

## 2023-02-11 ENCOUNTER — Other Ambulatory Visit: Payer: Medicaid Other

## 2023-02-11 ENCOUNTER — Encounter: Payer: Self-pay | Admitting: Surgery

## 2023-02-11 ENCOUNTER — Encounter: Payer: Self-pay | Admitting: Oncology

## 2023-02-13 ENCOUNTER — Inpatient Hospital Stay (HOSPITAL_BASED_OUTPATIENT_CLINIC_OR_DEPARTMENT_OTHER): Payer: Medicaid Other | Admitting: Oncology

## 2023-02-13 ENCOUNTER — Inpatient Hospital Stay: Payer: Medicaid Other

## 2023-02-13 ENCOUNTER — Encounter: Payer: Self-pay | Admitting: Oncology

## 2023-02-13 ENCOUNTER — Inpatient Hospital Stay: Payer: Medicaid Other | Attending: Oncology

## 2023-02-13 ENCOUNTER — Ambulatory Visit: Payer: Medicaid Other

## 2023-02-13 VITALS — BP 105/73 | HR 70

## 2023-02-13 VITALS — BP 123/82 | HR 73 | Temp 97.1°F | Resp 18 | Wt 146.2 lb

## 2023-02-13 DIAGNOSIS — Z1732 Human epidermal growth factor receptor 2 negative status: Secondary | ICD-10-CM | POA: Insufficient documentation

## 2023-02-13 DIAGNOSIS — Z86711 Personal history of pulmonary embolism: Secondary | ICD-10-CM | POA: Insufficient documentation

## 2023-02-13 DIAGNOSIS — Z5111 Encounter for antineoplastic chemotherapy: Secondary | ICD-10-CM

## 2023-02-13 DIAGNOSIS — Z1509 Genetic susceptibility to other malignant neoplasm: Secondary | ICD-10-CM | POA: Insufficient documentation

## 2023-02-13 DIAGNOSIS — I2699 Other pulmonary embolism without acute cor pulmonale: Secondary | ICD-10-CM | POA: Diagnosis not present

## 2023-02-13 DIAGNOSIS — Z5189 Encounter for other specified aftercare: Secondary | ICD-10-CM | POA: Diagnosis not present

## 2023-02-13 DIAGNOSIS — Z7901 Long term (current) use of anticoagulants: Secondary | ICD-10-CM | POA: Insufficient documentation

## 2023-02-13 DIAGNOSIS — C50919 Malignant neoplasm of unspecified site of unspecified female breast: Secondary | ICD-10-CM | POA: Diagnosis not present

## 2023-02-13 DIAGNOSIS — C50312 Malignant neoplasm of lower-inner quadrant of left female breast: Secondary | ICD-10-CM | POA: Insufficient documentation

## 2023-02-13 DIAGNOSIS — Z171 Estrogen receptor negative status [ER-]: Secondary | ICD-10-CM | POA: Insufficient documentation

## 2023-02-13 DIAGNOSIS — Z1501 Genetic susceptibility to malignant neoplasm of breast: Secondary | ICD-10-CM

## 2023-02-13 DIAGNOSIS — Z5112 Encounter for antineoplastic immunotherapy: Secondary | ICD-10-CM | POA: Insufficient documentation

## 2023-02-13 DIAGNOSIS — Z1722 Progesterone receptor negative status: Secondary | ICD-10-CM | POA: Insufficient documentation

## 2023-02-13 LAB — CMP (CANCER CENTER ONLY)
ALT: 44 U/L (ref 0–44)
AST: 33 U/L (ref 15–41)
Albumin: 3.8 g/dL (ref 3.5–5.0)
Alkaline Phosphatase: 67 U/L (ref 38–126)
Anion gap: 8 (ref 5–15)
BUN: 9 mg/dL (ref 6–20)
CO2: 24 mmol/L (ref 22–32)
Calcium: 8.7 mg/dL — ABNORMAL LOW (ref 8.9–10.3)
Chloride: 106 mmol/L (ref 98–111)
Creatinine: 0.49 mg/dL (ref 0.44–1.00)
GFR, Estimated: >60 mL/min (ref >60)
Glucose, Bld: 139 mg/dL — ABNORMAL HIGH (ref 70–99)
Potassium: 3.5 mmol/L (ref 3.5–5.1)
Sodium: 138 mmol/L (ref 135–145)
Total Bilirubin: 0.2 mg/dL (ref 0.0–1.2)
Total Protein: 7 g/dL (ref 6.5–8.1)

## 2023-02-13 LAB — CBC WITH DIFFERENTIAL (CANCER CENTER ONLY)
Abs Immature Granulocytes: 0.02 10*3/uL (ref 0.00–0.07)
Basophils Absolute: 0 10*3/uL (ref 0.0–0.1)
Basophils Relative: 0 %
Eosinophils Absolute: 0.1 10*3/uL (ref 0.0–0.5)
Eosinophils Relative: 2 %
HCT: 37.6 % (ref 36.0–46.0)
Hemoglobin: 12.4 g/dL (ref 12.0–15.0)
Immature Granulocytes: 0 %
Lymphocytes Relative: 35 %
Lymphs Abs: 2.1 10*3/uL (ref 0.7–4.0)
MCH: 31.2 pg (ref 26.0–34.0)
MCHC: 33 g/dL (ref 30.0–36.0)
MCV: 94.7 fL (ref 80.0–100.0)
Monocytes Absolute: 0.2 10*3/uL (ref 0.1–1.0)
Monocytes Relative: 4 %
Neutro Abs: 3.6 10*3/uL (ref 1.7–7.7)
Neutrophils Relative %: 59 %
Platelet Count: 173 10*3/uL (ref 150–400)
RBC: 3.97 MIL/uL (ref 3.87–5.11)
RDW: 15.2 % (ref 11.5–15.5)
WBC Count: 6.1 10*3/uL (ref 4.0–10.5)
nRBC: 0 % (ref 0.0–0.2)

## 2023-02-13 MED ORDER — DIPHENHYDRAMINE HCL 50 MG/ML IJ SOLN
50.0000 mg | Freq: Once | INTRAMUSCULAR | Status: AC
  Administered 2023-02-13: 50 mg via INTRAVENOUS
  Filled 2023-02-13: qty 1

## 2023-02-13 MED ORDER — SODIUM CHLORIDE 0.9 % IV SOLN
Freq: Once | INTRAVENOUS | Status: AC
  Filled 2023-02-13: qty 250

## 2023-02-13 MED ORDER — DEXAMETHASONE SODIUM PHOSPHATE 10 MG/ML IJ SOLN
10.0000 mg | Freq: Once | INTRAMUSCULAR | Status: AC
  Administered 2023-02-13: 10 mg via INTRAVENOUS
  Filled 2023-02-13: qty 1

## 2023-02-13 MED ORDER — SODIUM CHLORIDE 0.9% FLUSH
10.0000 mL | INTRAVENOUS | Status: DC | PRN
  Administered 2023-02-13: 10 mL
  Filled 2023-02-13: qty 10

## 2023-02-13 MED ORDER — FAMOTIDINE IN NACL 20-0.9 MG/50ML-% IV SOLN
20.0000 mg | Freq: Once | INTRAVENOUS | Status: AC
  Administered 2023-02-13: 20 mg via INTRAVENOUS
  Filled 2023-02-13: qty 50

## 2023-02-13 MED ORDER — SODIUM CHLORIDE 0.9 % IV SOLN
200.0000 mg | Freq: Once | INTRAVENOUS | Status: AC
  Administered 2023-02-13: 200 mg via INTRAVENOUS
  Filled 2023-02-13: qty 200

## 2023-02-13 MED ORDER — SODIUM CHLORIDE 0.9 % IV SOLN
80.0000 mg/m2 | Freq: Once | INTRAVENOUS | Status: AC
  Administered 2023-02-13: 126 mg via INTRAVENOUS
  Filled 2023-02-13: qty 21

## 2023-02-13 MED ORDER — PALONOSETRON HCL INJECTION 0.25 MG/5ML
0.2500 mg | Freq: Once | INTRAVENOUS | Status: AC
  Administered 2023-02-13: 0.25 mg via INTRAVENOUS
  Filled 2023-02-13: qty 5

## 2023-02-13 MED ORDER — HEPARIN SOD (PORK) LOCK FLUSH 100 UNIT/ML IV SOLN
500.0000 [IU] | Freq: Once | INTRAVENOUS | Status: AC | PRN
  Administered 2023-02-13: 500 [IU]
  Filled 2023-02-13: qty 5

## 2023-02-13 MED ORDER — SODIUM CHLORIDE 0.9 % IV SOLN
596.0000 mg | Freq: Once | INTRAVENOUS | Status: AC
  Administered 2023-02-13: 600 mg via INTRAVENOUS
  Filled 2023-02-13: qty 60

## 2023-02-13 MED ORDER — SODIUM CHLORIDE 0.9 % IV SOLN
150.0000 mg | Freq: Once | INTRAVENOUS | Status: AC
  Administered 2023-02-13: 150 mg via INTRAVENOUS
  Filled 2023-02-13: qty 150
  Filled 2023-02-13: qty 5

## 2023-02-13 NOTE — Assessment & Plan Note (Signed)
 Chemotherapy plan as listed above

## 2023-02-13 NOTE — Assessment & Plan Note (Signed)
Once she finished treatment of breast cancer, recommend risk-reducing salpingo oophorectomy  pancreatic screening at age 38 (or 71 years younger than the earliest exocrine pancreatic cancer diagnosis in the family, whichever is earlier)  Family members are encouraged to consider genetic testing for this familial pathogenic variant.

## 2023-02-13 NOTE — Assessment & Plan Note (Signed)
 Continue Eliquis 5mg  BID

## 2023-02-13 NOTE — Progress Notes (Signed)
 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  Images and pathology results were reviewed and discussed with patient.  MRI showed  6.7 cm of abnormal enhancement, predominantly in the lower-inner inferior left breast, with the ultrasound-guided biopsy marking clip centrally positioned within the enhancement. Suspicious for involvement by cancer, cT3 disease.  Patient desires mastectomy. Patient has no desire of preserving fertility.  Currently on neoadjuvant chemotherapy with Carboplatin , taxol  Keytruda  followed by [facility] Q3 weeks   Cycle 4 treatment was delayed due to bacteremia/medi port infection.  Proceed with cycle 4 Carboplatin , taxol  Keytruda   Repeat MRI breast      Acute pulmonary embolism (HCC) Continue Eliquis  5mg  BID  BRCA1 gene mutation positive Once she finished treatment of breast cancer, recommend risk-reducing salpingo oophorectomy  pancreatic screening at age 20 (or 58 years younger than the earliest exocrine pancreatic cancer diagnosis in the family, whichever is earlier)  Family members are encouraged to consider genetic testing for this familial pathogenic variant.   Encounter for antineoplastic chemotherapy Chemotherapy plan as listed above.    Orders Placed This Encounter  Procedures   MR BREAST BILATERAL W WO CONTRAST INC CAD    Standing Status:   Future    Expected Date:   03/03/2023    Expiration Date:   02/13/2024    If indicated for the ordered procedure, I authorize the administration of contrast media per Radiology protocol:    Yes    What is the patient's sedation requirement?:   No Sedation    Does the patient have a pacemaker or implanted devices?:   No    Radiology Contrast Protocol - do NOT remove file path:   [email or web address]    Preferred imaging location?:   [facility] (table limit - 550lbs)   Follow-up per LOS  All questions were answered. The patient knows to call the clinic with any problems, questions or concerns.  Call Babara, MD, PhD [facility] Hematology Oncology 02/13/2023    HISTORY OF PRESENTING ILLNESS:  Penny Gomez 38 y.o. female presents to establish care for left triple negative breast cancer I have reviewed her chart and materials related to her cancer extensively and collaborated history with the patient. Summary of oncologic history is as follows: Oncology History  Invasive carcinoma of breast (HCC)  07/24/2022 Mammogram   She noticed left breast mass for 1 month.  Bilateral diagnostic mammogram showed Suspicious palpable left breast mass 8 o'clock position. Cortically thickened left axillary lymph node   10/10/2022 Initial Diagnosis   Invasive carcinoma of breast (HCC)  10/02/2022  Left breast mass biopsy and left axillary lymph node biopsy.  Diagnosis 1. Breast, left, needle core biopsy, 8 o'clock 6 cmfn, heart clip - INVASIVE MAMMARY CARCINOMA, NO SPECIAL TYPE. - TUBULE FORMATION: SCORE 3 - NUCLEAR PLEOMORPHISM: SCORE 3 - MITOTIC COUNT: SCORE 3 - TOTAL SCORE: 9 - OVERALL GRADE: 3 - LYMPHOVASCULAR INVASION: NOT IDENTIFIED - CANCER LENGTH: 11 MM - CALCIFICATIONS: NOT IDENTIFIED - DUCTAL CARCINOMA IN SITU: PRESENT, HIGH-GRADE - ER-, PR- HER2 - [IHC 1+] 2. Lymph node, needle/core biopsy, left axillary,  hydromark - LYMPH NODE WITH REACTIVE CHANGES; NEGATIVE FOR MALIGNANCY.  Menarche at age of 12 First live birth at age of 35 OCP use: no History of hysterectomy: no Menopausal status: premenopausal History of HRT use:  no History of chest radiation: no Number of previous breast biopsies:  right breast biopsy 05/20/2019 negative for malignancy Strong family history of cancer mother breast cancer diagnosed in 60s, maternal aunts x 2 breast cancer, maternal cousins breast cancer x 2, maternal grandmother pancreatic cancer.    10/10/2022 Cancer Staging   Staging form: Breast, AJCC 8th Edition - Clinical stage from 10/10/2022: Stage IIIB (cT3, cN0, cM0, G3, ER-, PR-, HER2-) - Signed by Babara Call, MD on 10/18/2022 Stage prefix: Initial diagnosis Nuclear grade: G3 Histologic grading system: 3 grade system   10/17/2022 Imaging   Bilateral MRI breasts w wo contrast  1. In total, there is approximately 6.7 cm of abnormal enhancement, predominantly in the lower-inner inferior left breast, with the ultrasound-guided biopsy marking clip centrally positioned within the enhancement. There are scattered small enhancing masses in the lower inner and lower outer anterior left (included in the measurement above).   2.  No evidence of right breast malignancy.   3. The axillary lymph nodes are not included within the field of view of this MRI for adequate assessment of lymphadenopathy.    10/17/2022 Procedure   S/p medi port placement.    10/25/2022 - 10/25/2022 Chemotherapy   Patient is on Treatment Plan : BREAST ADJUVANT DOSE DENSE AC q14d / PACLitaxel  q7d     10/25/2022 -  Chemotherapy   Patient is on Treatment Plan : BREAST Pembrolizumab  (200) D1 + Carboplatin  (5) D1 + Paclitaxel  (80) D1,8,15 q21d X 4 cycles / Pembrolizumab  (200) D1 + AC D1 q21d x 4 cycles      Genetic Testing   Single pathogenic variant in BRCA1 called c.190T>G identified on the [facility] Multi-Cancer+RNA panel. The remainder of testing was normal. The report date is 11/11/2022.  The Multi-Cancer + RNA Panel offered by [facility] includes sequencing and/or deletion/duplication analysis of the following 70 genes:  AIP*, ALK, APC*, ATM*, AXIN2*, BAP1*, BARD1*, BLM*,  BMPR1A*, BRCA1*, BRCA2*, BRIP1*, CDC73*, CDH1*, CDK4, CDKN1B*, CDKN2A, CHEK2*, CTNNA1*, DICER1*, EPCAM, EGFR, FH*, FLCN*, GREM1, HOXB13, KIT, LZTR1, MAX*, MBD4, MEN1*, MET, MITF, MLH1*, MSH2*, MSH3*, MSH6*, MUTYH*, NF1*, NF2*, NTHL1*, PALB2*, PDGFRA, PMS2*, POLD1*, POLE*, POT1*, PRKAR1A*, PTCH1*, PTEN*, RAD51C*, RAD51D*, RB1*, RET, SDHA*, SDHAF2*, SDHB*, SDHC*, SDHD*, SMAD4*, SMARCA4*, SMARCB1*, SMARCE1*, STK11*, SUFU*, TMEM127*, TP53*, TSC1*, TSC2*, VHL*. RNA analysis is performed for * genes.   11/24/2022 - 11/25/2022 Hospital Admission   Admitted due to acute pulmonary embolism, lower extremity US  negative for DVT.  Patietn was started on heparin  gtt and transitioned to Eliquis  at discharge.     Today she presents for evaluation prior to chemotherapy She tolerates chemotherapy.  On Eliquis  5mg  BID for PE. She tolerates well.  S/p medi port placement.  No fever chills.  Repeat blood culture is negative.     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)    Microcytic anemia    MSSA bacteremia    Multifocal pneumonia    Sepsis (HCC)    due to port a cath   STD (sexually transmitted disease)  From Medical Hx;   Thrombocytopenia (HCC)    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   Vascular port complication     SURGICAL HISTORY: Past Surgical History:  Procedure Laterality Date   BREAST BIOPSY Right 05/20/2019   Affirm Bx- X- clip, neg   BREAST BIOPSY Left 10/02/2022   US  Bx, path pending   BREAST BIOPSY Left 10/02/2022   Us  Bx Node- path pending   BREAST BIOPSY Left 10/02/2022   US  LT BREAST BX W LOC DEV 1ST LESION IMG BX SPEC US  GUIDE 10/02/2022 [facility]   CESAREAN SECTION     LAPAROSCOPIC BILATERAL SALPINGECTOMY Bilateral 02/09/2022   Procedure: LAPAROSCOPIC  BILATERAL SALPINGECTOMY;  Surgeon: Connell Davies, MD;  Location: [facility];  Service: Gynecology;  Laterality: Bilateral;   PORTACATH PLACEMENT Right 10/17/2022   Procedure: INSERTION PORT-A-CATH;  Surgeon: Desiderio Schanz, MD;  Location: [facility] OR[facility];  Service: General;  Laterality: Right;   PORTACATH PLACEMENT Left 02/10/2023   Procedure: INSERTION PORT-A-CATH;  Surgeon: Tye Millet, DO;  Location: [facility] OR[facility];  Service: General;  Laterality: Left;   TEE WITHOUT CARDIOVERSION N/A 01/02/2023   Procedure: TRANSESOPHAGEAL ECHOCARDIOGRAM (TEE);  Surgeon: Perla Evalene PARAS, MD;  Location: [facility] ORS;  Service: Cardiovascular;  Laterality: N/A;   TUBAL LIGATION      SOCIAL HISTORY: Social History   Socioeconomic History   Marital status: Single    Spouse name: Not on file   Number of children: 2   Years of education: Not on file   Highest education level: High school graduate  Occupational History   Not on file  Tobacco Use   Smoking status: Never    Passive exposure: Never   Smokeless tobacco: Never  Vaping Use   Vaping status: Never Used  Substance and Sexual Activity   Alcohol use: Yes    Comment: occ   Drug use: No   Sexual activity: Yes    Birth control/protection: Surgical  Other Topics Concern   Not on file  Social History Narrative   Not on file   Social Drivers of Health   Financial Resource Strain: Low Risk  (01/20/2023)   [facility]   Overall Financial Resource Strain (CARDIA)    Difficulty of Paying Living Expenses: Not hard at all  Food Insecurity: No Food Insecurity (01/20/2023)   Received from [facility]   Hunger Vital Sign    Worried About Running Out of Food in the Last Year: Never true    Ran Out of Food in the Last Year: Never true  Transportation Needs: No Transportation Needs (01/20/2023)   Received from [facility] - Transportation    In the past 12 months, has lack of transportation  kept you from medical appointments or from getting medications?: No    Lack of Transportation (Non-Medical): No  Physical Activity: Not on file  Stress: No Stress Concern Present (10/10/2022)   [employer] - Occupational Stress Questionnaire    Feeling of Stress : Only a little  Social Connections: Not on file  Intimate Partner Violence: Not At Risk (12/31/2022)   Humiliation, Afraid, Rape, and Kick questionnaire    Fear of Current or Ex-Partner: No    Emotionally Abused: No    Physically Abused: No    Sexually Abused: No    FAMILY HISTORY: Family History  Problem Relation Age of Onset   Breast cancer Mother 49  no GT   Breast cancer Maternal Aunt 33   Breast cancer Maternal Aunt    Lung cancer Maternal Uncle    Lung cancer Maternal Uncle    Bone cancer Paternal Aunt    Pancreatic cancer Maternal Grandmother 26   Lung cancer Maternal Grandfather 65   Breast cancer Cousin 40   Breast cancer Other 33       gene pos?    ALLERGIES:  has no known allergies.  MEDICATIONS:  Current Outpatient Medications  Medication Sig Dispense Refill   acetaminophen  (TYLENOL ) 500 MG tablet Take 2 tablets (1,000 mg total) by mouth every 6 (six) hours as needed for mild pain.     apixaban  (ELIQUIS ) 5 MG TABS tablet Take 1 tablet (5 mg total) by mouth 2 (two) times daily. Start AFTER completing your starter pack 60 tablet 1   docusate sodium  (COLACE) 100 MG capsule Take 1 capsule (100 mg total) by mouth 2 (two) times daily as needed for up to 10 days for mild constipation. 20 capsule 0   HYDROcodone -acetaminophen  (NORCO) 5-325 MG tablet Take 1 tablet by mouth every 6 (six) hours as needed for up to 6 doses for moderate pain (pain score 4-6). 6 tablet 0   senna (SENOKOT) 8.6 MG TABS tablet Take 2 tablets (17.2 mg total) by mouth daily. 120 tablet 2   simethicone  (MYLICON) 80 MG chewable tablet Chew 1 tablet (80 mg total) by mouth every 6 (six) hours as needed for  flatulence. 30 tablet 2   lidocaine -prilocaine  (EMLA ) cream Apply 1 Application topically as needed. (Patient not taking: Reported on 01/27/2023) 30 g 11   No current facility-administered medications for this visit.   Facility-Administered Medications Ordered in Other Visits  Medication Dose Route Frequency Provider Last Rate Last Admin   CARBOplatin  (PARAPLATIN ) 600 mg in sodium chloride  0.9 % 250 mL chemo infusion  600 mg Intravenous Once Babara Call, MD 620 mL/hr at 02/13/23 1244 600 mg at 02/13/23 1244    Review of Systems  Constitutional:  Negative for appetite change, chills, fatigue and fever.  HENT:   Negative for hearing loss and voice change.   Eyes:  Negative for eye problems.  Respiratory:  Negative for chest tightness and cough.   Cardiovascular:  Negative for chest pain.  Gastrointestinal:  Negative for abdominal distention, abdominal pain and blood in stool.  Endocrine: Negative for hot flashes.  Genitourinary:  Positive for menstrual problem. Negative for difficulty urinating and frequency.   Musculoskeletal:  Negative for arthralgias.  Skin:  Negative for itching and rash.  Neurological:  Negative for extremity weakness and headaches.  Hematological:  Negative for adenopathy.  Psychiatric/Behavioral:  Negative for confusion.      PHYSICAL EXAMINATION: ECOG PERFORMANCE STATUS: 0 - Asymptomatic  Vitals:   02/13/23 0903  BP: 123/82  Pulse: 73  Resp: 18  Temp: (!) 97.1 F (36.2 C)  SpO2: 100%   Filed Weights   02/13/23 0903  Weight: 146 lb 3.2 oz (66.3 kg)    Physical Exam Constitutional:      General: She is not in acute distress.    Appearance: She is not diaphoretic.  HENT:     Head: Normocephalic and atraumatic.  Eyes:     General: No scleral icterus. Cardiovascular:     Rate and Rhythm: Normal rate and regular rhythm.  Pulmonary:     Effort: Pulmonary effort is normal. No respiratory distress.     Breath sounds: No wheezing.  Abdominal:  General: Bowel sounds are normal. There is no distension.     Palpations: Abdomen is soft.  Musculoskeletal:        General: Normal range of motion.     Cervical back: Normal range of motion and neck supple.  Skin:    General: Skin is warm and dry.     Findings: No erythema.  Neurological:     Mental Status: She is alert and oriented to person, place, and time. Mental status is at baseline.     Motor: No abnormal muscle tone.  Psychiatric:        Mood and Affect: Mood and affect normal.     LABORATORY DATA:  I have reviewed the data as listed    Latest Ref Rng & Units 02/13/2023    8:30 AM 01/30/2023    8:30 AM 01/23/2023   11:32 AM  CBC  WBC 4.0 - 10.5 K/uL 6.1  3.2  4.4   Hemoglobin 12.0 - 15.0 g/dL 87.5  87.0  87.5   Hematocrit 36.0 - 46.0 % 37.6  39.3  38.8   Platelets 150 - 400 K/uL 173  137  190       Latest Ref Rng & Units 02/13/2023    8:30 AM 01/30/2023    8:30 AM 01/23/2023   11:32 AM  CMP  Glucose 70 - 99 mg/dL 860  94  89   BUN 6 - 20 mg/dL 9  8  14    Creatinine 0.44 - 1.00 mg/dL 9.50  9.34  9.30   Sodium 135 - 145 mmol/L 138  138  137   Potassium 3.5 - 5.1 mmol/L 3.5  4.1  4.3   Chloride 98 - 111 mmol/L 106  103  105   CO2 22 - 32 mmol/L 24  25  26    Calcium  8.9 - 10.3 mg/dL 8.7  9.4  8.9   Total Protein 6.5 - 8.1 g/dL 7.0  8.0  7.1   Total Bilirubin 0.0 - 1.2 mg/dL 0.2  0.6  0.4   Alkaline Phos 38 - 126 U/L 67  76  71   AST 15 - 41 U/L 33  21  17   ALT 0 - 44 U/L 44  20  15      RADIOGRAPHIC STUDIES: I have personally reviewed the radiological images as listed and agreed with the findings in the report. DG CHEST PORT 1 VIEW Result Date: 02/10/2023 CLINICAL DATA:  Port-A-Cath placement, history of breast cancer EXAM: PORTABLE CHEST 1 VIEW COMPARISON:  01/02/2023. FINDINGS: Single frontal view of the chest demonstrates left chest wall port via internal jugular approach, tip overlying the confluence of the brachiocephalic veins. Cardiac silhouette is  unremarkable. No airspace disease, effusion, or pneumothorax. No acute bony abnormality. IMPRESSION: 1. Chest wall port via left internal jugular approach, tip overlying confluence of the brachiocephalic veins. 2. No acute intrathoracic process. Electronically Signed   By: Ozell Daring M.D.   On: 02/10/2023 15:26   DG C-Arm 1-60 Min-No Report Result Date: 02/10/2023 Fluoroscopy was utilized by the requesting physician.  No radiographic interpretation.

## 2023-02-13 NOTE — Patient Instructions (Signed)

## 2023-02-13 NOTE — Assessment & Plan Note (Addendum)
 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  Images and pathology results were reviewed and discussed with patient.  MRI showed  6.7 cm of abnormal enhancement, predominantly in the lower-inner inferior left breast, with the ultrasound-guided biopsy marking clip centrally positioned within the enhancement. Suspicious for involvement by cancer, cT3 disease.  Patient desires mastectomy. Patient has no desire of preserving fertility.  Currently on neoadjuvant chemotherapy with Carboplatin , taxol  Keytruda  followed by [facility] Q3 weeks   Cycle 4 treatment was delayed due to bacteremia/medi port infection.  Proceed with cycle 4 Carboplatin , taxol  Keytruda   Repeat MRI breast

## 2023-02-14 ENCOUNTER — Other Ambulatory Visit: Payer: Self-pay

## 2023-02-16 ENCOUNTER — Emergency Department
Admission: EM | Admit: 2023-02-16 | Discharge: 2023-02-16 | Disposition: A | Discharge status: Home / Self Care | Payer: Medicaid Other | Attending: Emergency Medicine | Admitting: Emergency Medicine

## 2023-02-16 ENCOUNTER — Other Ambulatory Visit: Payer: Self-pay

## 2023-02-16 DIAGNOSIS — N898 Other specified noninflammatory disorders of vagina: Secondary | ICD-10-CM | POA: Diagnosis present

## 2023-02-16 DIAGNOSIS — B3731 Acute candidiasis of vulva and vagina: Secondary | ICD-10-CM | POA: Insufficient documentation

## 2023-02-16 DIAGNOSIS — Z853 Personal history of malignant neoplasm of breast: Secondary | ICD-10-CM | POA: Diagnosis not present

## 2023-02-16 LAB — URINALYSIS, ROUTINE W REFLEX MICROSCOPIC
Bacteria, UA: NONE SEEN
Bilirubin Urine: NEGATIVE
Glucose, UA: NEGATIVE mg/dL
Ketones, ur: NEGATIVE mg/dL
Nitrite: NEGATIVE
Protein, ur: NEGATIVE mg/dL
Specific Gravity, Urine: 1.024 (ref 1.005–1.030)
pH: 5 (ref 5.0–8.0)

## 2023-02-16 LAB — POC URINE PREG, ED: Preg Test, Ur: NEGATIVE

## 2023-02-16 MED ORDER — FLUCONAZOLE 200 MG PO TABS
200.0000 mg | ORAL_TABLET | ORAL | 0 refills | Status: AC
Start: 2023-02-16 — End: 2023-02-24

## 2023-02-16 NOTE — ED Provider Notes (Signed)
 [facility] Provider Note    Event Date/Time   First MD Initiated Contact with Patient 02/16/23 1543     (approximate)   History   Vaginitis   HPI  Penny Gomez is a 38 y.o. female with a history of breast cancer, STD who comes to the ED complaining of vaginal itching and dysuria that started this morning.  Denies fevers chills chest pain shortness of breath.  No vaginal discharge or bleeding.  She is sexually active but denies the possibility of STI.  This feels like a yeast infection to her, and she declines pelvic exam or STI swab testing or empiric antibiotics and would just like Diflucan .     Physical Exam   Triage Vital Signs: ED Triage Vitals  Encounter Vitals Group     BP 02/16/23 1404 115/75     Systolic BP Percentile --      Diastolic BP Percentile --      Pulse Rate 02/16/23 1404 68     Resp 02/16/23 1404 17     Temp 02/16/23 1404 98 F (36.7 C)     Temp src --      SpO2 02/16/23 1404 96 %     Weight 02/16/23 1357 145 lb (65.8 kg)     Height 02/16/23 1357 5' (1.524 m)     Head Circumference --      Peak Flow --      Pain Score 02/16/23 1357 0     Pain Loc --      Pain Education --      Exclude from Growth Chart --     Most recent vital signs: Vitals:   02/16/23 1404  BP: 115/75  Pulse: 68  Resp: 17  Temp: 98 F (36.7 C)  SpO2: 96%    General: Awake, no distress.  CV:  Good peripheral perfusion.  Resp:  Normal effort.  Abd:  No distention.     ED Results / Procedures / Treatments   Labs (all labs ordered are listed, but only abnormal results are displayed) Labs Reviewed  URINALYSIS, ROUTINE W REFLEX MICROSCOPIC - Abnormal; Notable for the following components:      Result Value   Color, Urine YELLOW (*)    APPearance HAZY (*)    Hgb urine dipstick SMALL (*)    Leukocytes,Ua MODERATE (*)    All other components within normal limits  POC URINE PREG, ED      RADIOLOGY    PROCEDURES:  Procedures   MEDICATIONS ORDERED IN ED: Medications - No data to display   IMPRESSION / MDM / ASSESSMENT AND PLAN / ED COURSE  I reviewed the triage vital signs and the nursing notes.                             Patient presents with vaginal itching and dysuria.  Urinalysis not consistent with UTI.  Patient does not wish to be tested for STI currently.  Will provide empiric Diflucan  for Candida vaginitis, she can return for further evaluation if this does not resolve her symptoms.       FINAL CLINICAL IMPRESSION(S) / ED DIAGNOSES   Final diagnoses:  Candida vaginitis     Rx / DC Orders   ED Discharge Orders          Ordered    fluconazole  (DIFLUCAN ) 200 MG tablet  Weekly        02/16/23 1551  Note:  This document was prepared using Dragon voice recognition software and may include unintentional dictation errors.   Viviann Pastor, MD 02/16/23 [phone]

## 2023-02-16 NOTE — ED Triage Notes (Signed)
 Pt comes with c/o possible yeast infection. Pt states she noticed it this morning. Pt states no discharge. Pt states burning.

## 2023-02-19 ENCOUNTER — Encounter: Payer: Self-pay | Admitting: Oncology

## 2023-02-21 ENCOUNTER — Inpatient Hospital Stay: Payer: Medicaid Other

## 2023-02-21 ENCOUNTER — Other Ambulatory Visit: Payer: Self-pay | Admitting: Oncology

## 2023-02-21 DIAGNOSIS — C50919 Malignant neoplasm of unspecified site of unspecified female breast: Secondary | ICD-10-CM

## 2023-02-21 DIAGNOSIS — Z5112 Encounter for antineoplastic immunotherapy: Secondary | ICD-10-CM | POA: Diagnosis not present

## 2023-02-21 LAB — CBC WITH DIFFERENTIAL (CANCER CENTER ONLY)
Abs Immature Granulocytes: 0.01 10*3/uL (ref 0.00–0.07)
Basophils Absolute: 0 10*3/uL (ref 0.0–0.1)
Basophils Relative: 1 %
Eosinophils Absolute: 0 10*3/uL (ref 0.0–0.5)
Eosinophils Relative: 1 %
HCT: 33.9 % — ABNORMAL LOW (ref 36.0–46.0)
Hemoglobin: 11.3 g/dL — ABNORMAL LOW (ref 12.0–15.0)
Immature Granulocytes: 0 %
Lymphocytes Relative: 42 %
Lymphs Abs: 1.8 10*3/uL (ref 0.7–4.0)
MCH: 30.9 pg (ref 26.0–34.0)
MCHC: 33.3 g/dL (ref 30.0–36.0)
MCV: 92.6 fL (ref 80.0–100.0)
Monocytes Absolute: 0.2 10*3/uL (ref 0.1–1.0)
Monocytes Relative: 5 %
Neutro Abs: 2.2 10*3/uL (ref 1.7–7.7)
Neutrophils Relative %: 51 %
Platelet Count: 191 10*3/uL (ref 150–400)
RBC: 3.66 MIL/uL — ABNORMAL LOW (ref 3.87–5.11)
RDW: 13.9 % (ref 11.5–15.5)
WBC Count: 4.2 10*3/uL (ref 4.0–10.5)
nRBC: 0 % (ref 0.0–0.2)

## 2023-02-21 LAB — CMP (CANCER CENTER ONLY)
ALT: 30 U/L (ref 0–44)
AST: 18 U/L (ref 15–41)
Albumin: 3.8 g/dL (ref 3.5–5.0)
Alkaline Phosphatase: 66 U/L (ref 38–126)
Anion gap: 6 (ref 5–15)
BUN: 15 mg/dL (ref 6–20)
CO2: 23 mmol/L (ref 22–32)
Calcium: 8.6 mg/dL — ABNORMAL LOW (ref 8.9–10.3)
Chloride: 107 mmol/L (ref 98–111)
Creatinine: 0.47 mg/dL (ref 0.44–1.00)
GFR, Estimated: >60 mL/min (ref >60)
Glucose, Bld: 92 mg/dL (ref 70–99)
Potassium: 4.2 mmol/L (ref 3.5–5.1)
Sodium: 136 mmol/L (ref 135–145)
Total Bilirubin: 0.5 mg/dL (ref 0.0–1.2)
Total Protein: 6.9 g/dL (ref 6.5–8.1)

## 2023-02-22 ENCOUNTER — Other Ambulatory Visit: Payer: Self-pay

## 2023-02-24 ENCOUNTER — Encounter: Payer: Self-pay | Admitting: Oncology

## 2023-02-26 ENCOUNTER — Encounter: Payer: Self-pay | Admitting: Oncology

## 2023-02-27 ENCOUNTER — Other Ambulatory Visit: Payer: Self-pay

## 2023-02-28 ENCOUNTER — Inpatient Hospital Stay: Payer: Medicaid Other

## 2023-02-28 ENCOUNTER — Inpatient Hospital Stay (HOSPITAL_BASED_OUTPATIENT_CLINIC_OR_DEPARTMENT_OTHER): Payer: Medicaid Other | Admitting: Oncology

## 2023-02-28 ENCOUNTER — Encounter: Payer: Self-pay | Admitting: Oncology

## 2023-02-28 ENCOUNTER — Other Ambulatory Visit: Payer: Self-pay

## 2023-02-28 ENCOUNTER — Ambulatory Visit: Payer: Medicaid Other

## 2023-02-28 ENCOUNTER — Other Ambulatory Visit: Payer: Medicaid Other

## 2023-02-28 VITALS — BP 110/88 | HR 78 | Temp 96.7°F | Resp 16 | Wt 145.9 lb

## 2023-02-28 VITALS — BP 111/67 | HR 92 | Resp 16

## 2023-02-28 DIAGNOSIS — C50919 Malignant neoplasm of unspecified site of unspecified female breast: Secondary | ICD-10-CM

## 2023-02-28 DIAGNOSIS — Z5111 Encounter for antineoplastic chemotherapy: Secondary | ICD-10-CM

## 2023-02-28 DIAGNOSIS — Z5112 Encounter for antineoplastic immunotherapy: Secondary | ICD-10-CM | POA: Diagnosis not present

## 2023-02-28 DIAGNOSIS — Z1501 Genetic susceptibility to malignant neoplasm of breast: Secondary | ICD-10-CM | POA: Diagnosis not present

## 2023-02-28 DIAGNOSIS — I2699 Other pulmonary embolism without acute cor pulmonale: Secondary | ICD-10-CM

## 2023-02-28 DIAGNOSIS — D500 Iron deficiency anemia secondary to blood loss (chronic): Secondary | ICD-10-CM | POA: Diagnosis not present

## 2023-02-28 DIAGNOSIS — Z1509 Genetic susceptibility to other malignant neoplasm: Secondary | ICD-10-CM

## 2023-02-28 LAB — CBC WITH DIFFERENTIAL (CANCER CENTER ONLY)
Abs Immature Granulocytes: 0.02 10*3/uL (ref 0.00–0.07)
Basophils Absolute: 0 10*3/uL (ref 0.0–0.1)
Basophils Relative: 0 %
Eosinophils Absolute: 0 10*3/uL (ref 0.0–0.5)
Eosinophils Relative: 1 %
HCT: 30.5 % — ABNORMAL LOW (ref 36.0–46.0)
Hemoglobin: 9.9 g/dL — ABNORMAL LOW (ref 12.0–15.0)
Immature Granulocytes: 1 %
Lymphocytes Relative: 41 %
Lymphs Abs: 1.8 10*3/uL (ref 0.7–4.0)
MCH: 30.6 pg (ref 26.0–34.0)
MCHC: 32.5 g/dL (ref 30.0–36.0)
MCV: 94.1 fL (ref 80.0–100.0)
Monocytes Absolute: 0.3 10*3/uL (ref 0.1–1.0)
Monocytes Relative: 8 %
Neutro Abs: 2.1 10*3/uL (ref 1.7–7.7)
Neutrophils Relative %: 49 %
Platelet Count: 181 10*3/uL (ref 150–400)
RBC: 3.24 MIL/uL — ABNORMAL LOW (ref 3.87–5.11)
RDW: 14.8 % (ref 11.5–15.5)
WBC Count: 4.3 10*3/uL (ref 4.0–10.5)
nRBC: 0 % (ref 0.0–0.2)

## 2023-02-28 LAB — RETIC PANEL
Immature Retic Fract: 20.8 % — ABNORMAL HIGH (ref 2.3–15.9)
RBC.: 3.21 MIL/uL — ABNORMAL LOW (ref 3.87–5.11)
Retic Count, Absolute: 183.9 10*3/uL (ref 19.0–186.0)
Retic Ct Pct: 5.7 % — ABNORMAL HIGH (ref 0.4–3.1)
Reticulocyte Hemoglobin: 28.9 pg (ref >27.9)

## 2023-02-28 LAB — CMP (CANCER CENTER ONLY)
ALT: 44 U/L (ref 0–44)
AST: 29 U/L (ref 15–41)
Albumin: 3.7 g/dL (ref 3.5–5.0)
Alkaline Phosphatase: 67 U/L (ref 38–126)
Anion gap: 7 (ref 5–15)
BUN: 10 mg/dL (ref 6–20)
CO2: 24 mmol/L (ref 22–32)
Calcium: 8.7 mg/dL — ABNORMAL LOW (ref 8.9–10.3)
Chloride: 105 mmol/L (ref 98–111)
Creatinine: 0.37 mg/dL — ABNORMAL LOW (ref 0.44–1.00)
GFR, Estimated: >60 mL/min (ref >60)
Glucose, Bld: 92 mg/dL (ref 70–99)
Potassium: 3.8 mmol/L (ref 3.5–5.1)
Sodium: 136 mmol/L (ref 135–145)
Total Bilirubin: 0.4 mg/dL (ref 0.0–1.2)
Total Protein: 6.7 g/dL (ref 6.5–8.1)

## 2023-02-28 LAB — IRON AND TIBC
Iron: 40 ug/dL (ref 28–170)
Saturation Ratios: 11 % (ref 10.4–31.8)
TIBC: 377 ug/dL (ref 250–450)
UIBC: 337 ug/dL

## 2023-02-28 LAB — FERRITIN: Ferritin: 23 ng/mL (ref 11–307)

## 2023-02-28 MED ORDER — SODIUM CHLORIDE 0.9 % IV SOLN
Freq: Once | INTRAVENOUS | Status: AC
  Filled 2023-02-28: qty 250

## 2023-02-28 MED ORDER — DIPHENHYDRAMINE HCL 50 MG/ML IJ SOLN
50.0000 mg | Freq: Once | INTRAMUSCULAR | Status: AC
  Administered 2023-02-28: 50 mg via INTRAVENOUS
  Filled 2023-02-28: qty 1

## 2023-02-28 MED ORDER — SODIUM CHLORIDE 0.9 % IV SOLN
80.0000 mg/m2 | Freq: Once | INTRAVENOUS | Status: AC
  Administered 2023-02-28: 126 mg via INTRAVENOUS
  Filled 2023-02-28: qty 21

## 2023-02-28 MED ORDER — HEPARIN SOD (PORK) LOCK FLUSH 100 UNIT/ML IV SOLN
500.0000 [IU] | Freq: Once | INTRAVENOUS | Status: AC | PRN
  Administered 2023-02-28: 500 [IU]
  Filled 2023-02-28: qty 5

## 2023-02-28 MED ORDER — FAMOTIDINE IN NACL 20-0.9 MG/50ML-% IV SOLN
20.0000 mg | Freq: Once | INTRAVENOUS | Status: AC
  Administered 2023-02-28: 20 mg via INTRAVENOUS
  Filled 2023-02-28: qty 50

## 2023-02-28 MED ORDER — DEXAMETHASONE SODIUM PHOSPHATE 10 MG/ML IJ SOLN
10.0000 mg | Freq: Once | INTRAMUSCULAR | Status: AC
  Administered 2023-02-28: 10 mg via INTRAVENOUS
  Filled 2023-02-28: qty 1

## 2023-02-28 NOTE — Assessment & Plan Note (Signed)
Once she finished treatment of breast cancer, recommend risk-reducing salpingo oophorectomy  pancreatic screening at age 38 (or 71 years younger than the earliest exocrine pancreatic cancer diagnosis in the family, whichever is earlier)  Family members are encouraged to consider genetic testing for this familial pathogenic variant.

## 2023-02-28 NOTE — Progress Notes (Signed)
 Patient states that she is doing well, no new questions or concerns for the doctor today.

## 2023-02-28 NOTE — Patient Instructions (Signed)

## 2023-02-28 NOTE — Assessment & Plan Note (Signed)
Continue Eliquis 5mg BID 

## 2023-02-28 NOTE — Assessment & Plan Note (Signed)
Chemotherapy plan as listed above 

## 2023-02-28 NOTE — Assessment & Plan Note (Addendum)
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  Images and pathology results were reviewed and discussed with patient.  MRI showed  6.7 cm of abnormal enhancement, predominantly in the lower-inner inferior left breast, with the ultrasound-guided biopsy marking clip centrally positioned within the enhancement. Suspicious for involvement by cancer, cT3 disease.  Patient desires mastectomy. Patient has no desire of preserving fertility.  Currently on neoadjuvant chemotherapy with Carboplatin, taxol Keytruda followed by [facility] Q3 weeks   Cycle 4 treatment was delayed due to bacteremia/medi port infection.  Proceed with cycle 4 D8 taxol, D15 Taxol next week, and D16-18 GCSF Repeat MRI breast

## 2023-02-28 NOTE — Assessment & Plan Note (Addendum)
Labs are reviewed and discussed with patient. Lab Results  Component Value Date   HGB 9.9 (L) 02/28/2023   TIBC 377 02/28/2023   IRONPCTSAT 11 02/28/2023   FERRITIN 23 02/28/2023   Hb is decreased, likely due to heavy menstrual period.  Will arrange patient to get IV venofer

## 2023-02-28 NOTE — Progress Notes (Signed)
Hematology/Oncology Progress note [phone] [phone]       CHIEF COMPLAINTS/PURPOSE OF CONSULTATION:  Left triple negative breast cancer.   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  Images and pathology results were reviewed and discussed with patient.  MRI showed  6.7 cm of abnormal enhancement, predominantly in the lower-inner inferior left breast, with the ultrasound-guided biopsy marking clip centrally positioned within the enhancement. Suspicious for involvement by cancer, cT3 disease.  Patient desires mastectomy. Patient has no desire of preserving fertility.  Currently on neoadjuvant chemotherapy with Carboplatin, taxol Keytruda followed by [facility] Q3 weeks   Cycle 4 treatment was delayed due to bacteremia/medi port infection.  Proceed with cycle 4 D8 taxol, D15 Taxol next week, and D16-18 GCSF Repeat MRI breast   Acute pulmonary embolism (HCC) Continue Eliquis 5mg  BID  BRCA1 gene mutation positive Once she finished treatment of breast cancer, recommend risk-reducing salpingo oophorectomy  pancreatic screening at age 12 (or 60 years younger than the earliest exocrine pancreatic cancer diagnosis in the family, whichever is earlier)  Family members are encouraged to consider genetic testing for this familial pathogenic variant.   Encounter for antineoplastic chemotherapy Chemotherapy plan as listed above.   IDA (iron deficiency anemia) Labs are reviewed and discussed with patient. Lab Results  Component Value Date   HGB 9.9 (L) 02/28/2023   TIBC 377 02/28/2023   IRONPCTSAT 11 02/28/2023   FERRITIN 23 02/28/2023   Hb is decreased, likely due to heavy menstrual period.  Will  arrange patient to get IV venofer   Orders Placed This Encounter  Procedures   Iron and TIBC    Standing Status:   Future    Number of Occurrences:   1    Expected Date:   02/28/2023    Expiration Date:   02/28/2024   Ferritin    Standing Status:   Future    Number of Occurrences:   1    Expected Date:   02/28/2023    Expiration Date:   02/28/2024   Retic Panel    Standing Status:   Future    Number of Occurrences:   1    Expected Date:   02/28/2023    Expiration Date:   02/28/2024   Follow-up per LOS  All questions were answered. The patient knows to call the clinic with any problems, questions or concerns.  [name], MD, PhD [facility] Hematology Oncology 02/28/2023    HISTORY OF PRESENTING ILLNESS:  Penny Gomez 38 y.o. female presents to establish care for left triple negative breast cancer I have reviewed her chart and materials related to her cancer extensively and collaborated history with the patient. Summary of oncologic history is as follows: Oncology History  Invasive carcinoma of breast (HCC)  07/24/2022 Mammogram   She noticed left breast mass for 1 month.  Bilateral diagnostic mammogram showed Suspicious palpable left breast mass 8 o'clock position. Cortically thickened left axillary lymph node   10/10/2022 Initial Diagnosis   Invasive carcinoma of breast (HCC)  10/02/2022  Left breast mass biopsy and left axillary lymph node biopsy.  Diagnosis 1. Breast, left, needle core biopsy, 8 o'clock 6 cmfn, heart clip - INVASIVE MAMMARY CARCINOMA, NO SPECIAL TYPE. -  TUBULE FORMATION: SCORE 3 - NUCLEAR PLEOMORPHISM: SCORE 3 - MITOTIC COUNT: SCORE 3 - TOTAL SCORE: 9 - OVERALL GRADE: 3 - LYMPHOVASCULAR INVASION: NOT IDENTIFIED - CANCER LENGTH: 11 MM - CALCIFICATIONS: NOT IDENTIFIED - DUCTAL CARCINOMA IN SITU: PRESENT, HIGH-GRADE - ER-, PR- HER2 - [facility] 1+] 2. Lymph node, needle/core biopsy, left axillary, hydromark - LYMPH NODE WITH REACTIVE CHANGES; NEGATIVE FOR  MALIGNANCY.  Menarche at age of 57 First live birth at age of 34 OCP use: no History of hysterectomy: no Menopausal status: premenopausal History of HRT use: no History of chest radiation: no Number of previous breast biopsies:  right breast biopsy 05/20/2019 negative for malignancy Strong family history of cancer mother breast cancer diagnosed in 10s, maternal aunts x 2 breast cancer, maternal cousins breast cancer x 2, maternal grandmother pancreatic cancer.    10/10/2022 Cancer Staging   Staging form: Breast, AJCC 8th Edition - Clinical stage from 10/10/2022: Stage IIIB (cT3, cN0, cM0, G3, ER-, PR-, HER2-) - Signed by [name], MD on 10/18/2022 Stage prefix: Initial diagnosis Nuclear grade: G3 Histologic grading system: 3 grade system   10/17/2022 Imaging   Bilateral MRI breasts w wo contrast  1. In total, there is approximately 6.7 cm of abnormal enhancement, predominantly in the lower-inner inferior left breast, with the ultrasound-guided biopsy marking clip centrally positioned within the enhancement. There are scattered small enhancing masses in the lower inner and lower outer anterior left (included in the measurement above).   2.  No evidence of right breast malignancy.   3. The axillary lymph nodes are not included within the field of view of this MRI for adequate assessment of lymphadenopathy.    10/17/2022 Procedure   S/p medi port placement.    10/25/2022 - 10/25/2022 Chemotherapy   Patient is on Treatment Plan : BREAST ADJUVANT DOSE DENSE AC q14d / PACLitaxel q7d     10/25/2022 -  Chemotherapy   Patient is on Treatment Plan : BREAST Pembrolizumab (200) D1 + Carboplatin (5) D1 + Paclitaxel (80) D1,8,15 q21d X 4 cycles / Pembrolizumab (200) D1 + AC D1 q21d x 4 cycles      Genetic Testing   Single pathogenic variant in BRCA1 called c.190T>G identified on the [facility] Multi-Cancer+RNA panel. The remainder of testing was normal. The report date is 11/11/2022.  The Multi-Cancer + RNA  Panel offered by [facility] includes sequencing and/or deletion/duplication analysis of the following 70 genes:  AIP*, ALK, APC*, ATM*, AXIN2*, BAP1*, BARD1*, BLM*, BMPR1A*, BRCA1*, BRCA2*, BRIP1*, CDC73*, CDH1*, CDK4, CDKN1B*, CDKN2A, CHEK2*, CTNNA1*, DICER1*, EPCAM, EGFR, FH*, FLCN*, GREM1, HOXB13, KIT, LZTR1, MAX*, MBD4, MEN1*, MET, MITF, MLH1*, MSH2*, MSH3*, MSH6*, MUTYH*, NF1*, NF2*, NTHL1*, PALB2*, PDGFRA, PMS2*, POLD1*, POLE*, POT1*, PRKAR1A*, PTCH1*, PTEN*, RAD51C*, RAD51D*, RB1*, RET, SDHA*, SDHAF2*, SDHB*, SDHC*, SDHD*, SMAD4*, SMARCA4*, SMARCB1*, SMARCE1*, STK11*, SUFU*, TMEM127*, TP53*, TSC1*, TSC2*, VHL*. RNA analysis is performed for * genes.   11/24/2022 - 11/25/2022 Hospital Admission   Admitted due to acute pulmonary embolism, lower extremity US negative for DVT.  Patietn was started on heparin gtt and transitioned to Eliquis at discharge.    02/10/2023 Procedure   S/p port placement.     Today she presents for evaluation prior to chemotherapy She tolerates chemotherapy.  On Eliquis 5mg  BID for PE. She tolerates well.  empiric Diflucan for Candida vaginitis, symptoms completely resolved.  No fever chills.      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)    Microcytic anemia    MSSA bacteremia    Multifocal pneumonia    Sepsis (HCC)    due to port a cath   STD (sexually transmitted disease)    From Medical Hx;   Thrombocytopenia (HCC)    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   Vascular port complication     SURGICAL HISTORY: Past Surgical History:  Procedure Laterality Date   BREAST BIOPSY Right 05/20/2019   Affirm Bx- X- clip, neg   BREAST BIOPSY Left 10/02/2022   [place] Bx, path pending   BREAST BIOPSY Left 10/02/2022   [place] Bx Node- path pending    BREAST BIOPSY Left 10/02/2022   [place] LT BREAST BX W LOC DEV 1ST LESION IMG BX SPEC US GUIDE 10/02/2022 [facility]   CESAREAN SECTION     LAPAROSCOPIC BILATERAL SALPINGECTOMY Bilateral 02/09/2022   Procedure: LAPAROSCOPIC BILATERAL SALPINGECTOMY;  Surgeon: Hildred Laser, MD;  Location: [facility];  Service: Gynecology;  Laterality: Bilateral;   PORTACATH PLACEMENT Right 10/17/2022   Procedure: INSERTION PORT-A-CATH;  Surgeon: Henrene Dodge, MD;  Location: [facility];  Service: General;  Laterality: Right;   PORTACATH PLACEMENT Left 02/10/2023   Procedure: INSERTION PORT-A-CATH;  Surgeon: Sung Amabile, DO;  Location: [facility];  Service: General;  Laterality: Left;   TEE WITHOUT CARDIOVERSION N/A 01/02/2023   Procedure: TRANSESOPHAGEAL ECHOCARDIOGRAM (TEE);  Surgeon: Antonieta Iba, MD;  Location: [facility];  Service: Cardiovascular;  Laterality: N/A;   TUBAL LIGATION      SOCIAL HISTORY: Social History   Socioeconomic History   Marital status: Single    Spouse name: Not on file   Number of children: 2   Years of education: Not on file   Highest education level: High school graduate  Occupational History   Not on file  Tobacco Use   Smoking status: Never    Passive exposure: Never   Smokeless tobacco: Never  Vaping Use   Vaping status: Never Used  Substance and Sexual Activity   Alcohol use: Yes    Comment: occ   Drug use: No   Sexual activity: Yes    Birth control/protection: Surgical  Other Topics Concern   Not on file  Social History Narrative   Not on file   Social Drivers of Health   Financial Resource Strain: Low Risk  (01/20/2023)   Received [facility]   Overall Financial Resource Strain (CARDIA)    Difficulty of Paying Living Expenses: Not hard at all  Food Insecurity: No Food Insecurity (01/20/2023)   Received from [facility]   Hunger Vital Sign    Worried About Running Out of Food in the Last Year: Never true    Ran Out  of Food in the Last Year: Never true  Transportation Needs: No Transportation Needs (01/20/2023)   Received from [facility] - Transportation    In the past 12 months, has lack of transportation kept you from medical appointments or from getting medications?: No    Lack of Transportation (Non-Medical): No  Physical Activity: Not on file  Stress: No Stress Concern Present (10/10/2022)   [employer] of Occupational Health - Occupational Stress Questionnaire    Feeling of Stress : Only a little  Social Connections: Not on file  Intimate Partner Violence: Not At Risk (12/31/2022)   Humiliation, Afraid, Rape, and Kick questionnaire    Fear of Current or Ex-Partner: No    Emotionally Abused: No    Physically Abused: No    Sexually Abused: No    FAMILY HISTORY: Family History  Problem Relation Age of Onset   Breast cancer Mother 37       no GT   Breast cancer Maternal Aunt 33   Breast cancer Maternal Aunt    Lung cancer Maternal Uncle    Lung cancer Maternal Uncle    Bone cancer Paternal Aunt    Pancreatic cancer Maternal Grandmother 81   Lung cancer Maternal Grandfather 50   Breast cancer Cousin 40   Breast cancer Other 33       gene pos?    ALLERGIES:  has no known allergies.  MEDICATIONS:  Current Outpatient Medications  Medication Sig Dispense Refill   acetaminophen (TYLENOL) 500 MG tablet Take 2 tablets (1,000 mg total) by mouth every 6 (six) hours as needed for mild pain.     apixaban (ELIQUIS) 5 MG TABS tablet Take 1 tablet (5 mg total) by mouth 2 (two) times daily. Start AFTER completing your starter pack 60 tablet 1   HYDROcodone-acetaminophen (NORCO) 5-325 MG tablet Take 1 tablet by mouth every 6 (six) hours as needed for up to 6 doses for moderate pain (pain score 4-6). 6 tablet 0   lidocaine-prilocaine (EMLA) cream Apply 1 Application topically as needed. (Patient not taking: Reported on 01/27/2023) 30 g 11   senna (SENOKOT) 8.6 MG  TABS tablet Take 2 tablets (17.2 mg total) by mouth daily. 120 tablet 2   simethicone (MYLICON) 80 MG chewable tablet Chew 1 tablet (80 mg total) by mouth every 6 (six) hours as needed for flatulence. 30 tablet 2   No current facility-administered medications for this visit.    Review of Systems  Constitutional:  Negative for appetite change, chills, fatigue and fever.  HENT:   Negative for hearing loss and voice change.   Eyes:  Negative for eye problems.  Respiratory:  Negative for chest tightness and cough.   Cardiovascular:  Negative for chest pain.  Gastrointestinal:  Negative for abdominal distention, abdominal pain and blood in stool.  Endocrine: Negative for hot flashes.  Genitourinary:  Positive for menstrual problem. Negative for difficulty urinating and frequency.   Musculoskeletal:  Negative for arthralgias.  Skin:  Negative for itching and rash.  Neurological:  Negative for extremity weakness and headaches.  Hematological:  Negative for adenopathy.  Psychiatric/Behavioral:  Negative for confusion.      PHYSICAL EXAMINATION: ECOG PERFORMANCE STATUS: 0 - Asymptomatic  Vitals:   02/28/23 0909  BP: 110/88  Pulse: 78  Resp: 16  Temp: (!) 96.7 F (35.9 C)  SpO2: 100%   Filed Weights   02/28/23 0909  Weight: 145 lb 14.4 oz (66.2 kg)    Physical Exam Constitutional:      General: She is not in acute distress.    Appearance: She is not diaphoretic.  HENT:     Head: Normocephalic and atraumatic.  Eyes:     General: No scleral icterus. Cardiovascular:     Rate and Rhythm: Normal rate and regular rhythm.  Pulmonary:     Effort: Pulmonary effort is normal. No respiratory distress.     Breath sounds: No wheezing.  Abdominal:     General: Bowel sounds are normal. There is no distension.     Palpations: Abdomen is  soft.  Musculoskeletal:        General: Normal range of motion.     Cervical back: Normal range of motion and neck supple.  Skin:    General: Skin  is warm and dry.     Findings: No erythema.     Comments: Left anterior chest wall medi port  Neurological:     Mental Status: She is alert and oriented to person, place, and time. Mental status is at baseline.     Motor: No abnormal muscle tone.  Psychiatric:        Mood and Affect: Mood and affect normal.     LABORATORY DATA:  I have reviewed the data as listed    Latest Ref Rng & Units 02/28/2023    8:50 AM 02/21/2023    8:06 AM 02/13/2023    8:30 AM  CBC  WBC 4.0 - 10.5 K/uL 4.3  4.2  6.1   Hemoglobin 12.0 - 15.0 g/dL 9.9  62.9  52.8   Hematocrit 36.0 - 46.0 % 30.5  33.9  37.6   Platelets 150 - 400 K/uL 181  191  173       Latest Ref Rng & Units 02/28/2023    8:50 AM 02/21/2023    8:06 AM 02/13/2023    8:30 AM  CMP  Glucose 70 - 99 mg/dL 92  92  413   BUN 6 - 20 mg/dL 10  15  9    Creatinine 0.44 - 1.00 mg/dL 2.44  0.10  2.72   Sodium 135 - 145 mmol/L 136  136  138   Potassium 3.5 - 5.1 mmol/L 3.8  4.2  3.5   Chloride 98 - 111 mmol/L 105  107  106   CO2 22 - 32 mmol/L 24  23  24    Calcium 8.9 - 10.3 mg/dL 8.7  8.6  8.7   Total Protein 6.5 - 8.1 g/dL 6.7  6.9  7.0   Total Bilirubin 0.0 - 1.2 mg/dL 0.4  0.5  0.2   Alkaline Phos 38 - 126 U/L 67  66  67   AST 15 - 41 U/L 29  18  33   ALT 0 - 44 U/L 44  30  44      RADIOGRAPHIC STUDIES: I have personally reviewed the radiological images as listed and agreed with the findings in the report. DG CHEST PORT 1 VIEW Result Date: 02/10/2023 CLINICAL DATA:  Port-A-Cath placement, history of breast cancer EXAM: PORTABLE CHEST 1 VIEW COMPARISON:  01/02/2023. FINDINGS: Single frontal view of the chest demonstrates left chest wall port via internal jugular approach, tip overlying the confluence of the brachiocephalic veins. Cardiac silhouette is unremarkable. No airspace disease, effusion, or pneumothorax. No acute bony abnormality. IMPRESSION: 1. Chest wall port via left internal jugular approach, tip overlying confluence of the  brachiocephalic veins. 2. No acute intrathoracic process. Electronically Signed   By: Sharlet Salina M.D.   On: 02/10/2023 15:26   DG C-Arm 1-60 Min-No Report Result Date: 02/10/2023 Fluoroscopy was utilized by the requesting physician.  No radiographic interpretation.

## 2023-03-02 ENCOUNTER — Other Ambulatory Visit: Payer: Self-pay

## 2023-03-03 ENCOUNTER — Ambulatory Visit: Payer: Medicaid Other

## 2023-03-04 ENCOUNTER — Ambulatory Visit: Payer: Medicaid Other

## 2023-03-05 ENCOUNTER — Ambulatory Visit: Payer: Medicaid Other

## 2023-03-05 ENCOUNTER — Encounter: Payer: Self-pay | Admitting: Oncology

## 2023-03-05 ENCOUNTER — Inpatient Hospital Stay: Payer: Medicaid Other

## 2023-03-05 VITALS — BP 112/82 | HR 76 | Temp 98.7°F | Resp 18

## 2023-03-05 DIAGNOSIS — Z5112 Encounter for antineoplastic immunotherapy: Secondary | ICD-10-CM | POA: Diagnosis not present

## 2023-03-05 DIAGNOSIS — D500 Iron deficiency anemia secondary to blood loss (chronic): Secondary | ICD-10-CM

## 2023-03-05 MED ORDER — HEPARIN SOD (PORK) LOCK FLUSH 100 UNIT/ML IV SOLN
500.0000 [IU] | Freq: Once | INTRAVENOUS | Status: AC
  Administered 2023-03-05: 500 [IU] via INTRAVENOUS
  Filled 2023-03-05: qty 5

## 2023-03-05 MED ORDER — IRON SUCROSE 20 MG/ML IV SOLN
200.0000 mg | Freq: Once | INTRAVENOUS | Status: AC
  Administered 2023-03-05: 200 mg via INTRAVENOUS

## 2023-03-05 MED ORDER — SODIUM CHLORIDE 0.9% FLUSH
10.0000 mL | Freq: Once | INTRAVENOUS | Status: AC | PRN
  Administered 2023-03-05: 10 mL
  Filled 2023-03-05: qty 10

## 2023-03-06 ENCOUNTER — Other Ambulatory Visit: Payer: Self-pay

## 2023-03-06 DIAGNOSIS — C50919 Malignant neoplasm of unspecified site of unspecified female breast: Secondary | ICD-10-CM

## 2023-03-06 DIAGNOSIS — D500 Iron deficiency anemia secondary to blood loss (chronic): Secondary | ICD-10-CM

## 2023-03-07 ENCOUNTER — Ambulatory Visit: Payer: Medicaid Other | Admitting: Oncology

## 2023-03-07 ENCOUNTER — Inpatient Hospital Stay: Payer: Medicaid Other

## 2023-03-07 ENCOUNTER — Ambulatory Visit: Payer: Medicaid Other

## 2023-03-07 ENCOUNTER — Other Ambulatory Visit: Payer: Medicaid Other

## 2023-03-07 VITALS — BP 109/78 | HR 75 | Temp 97.9°F | Resp 16 | Wt 147.3 lb

## 2023-03-07 DIAGNOSIS — C50919 Malignant neoplasm of unspecified site of unspecified female breast: Secondary | ICD-10-CM

## 2023-03-07 DIAGNOSIS — Z5112 Encounter for antineoplastic immunotherapy: Secondary | ICD-10-CM | POA: Diagnosis not present

## 2023-03-07 LAB — CBC WITH DIFFERENTIAL (CANCER CENTER ONLY)
Abs Immature Granulocytes: 0.05 10*3/uL (ref 0.00–0.07)
Basophils Absolute: 0 10*3/uL (ref 0.0–0.1)
Basophils Relative: 0 %
Eosinophils Absolute: 0 10*3/uL (ref 0.0–0.5)
Eosinophils Relative: 0 %
HCT: 34 % — ABNORMAL LOW (ref 36.0–46.0)
Hemoglobin: 11 g/dL — ABNORMAL LOW (ref 12.0–15.0)
Immature Granulocytes: 1 %
Lymphocytes Relative: 38 %
Lymphs Abs: 2 10*3/uL (ref 0.7–4.0)
MCH: 30.3 pg (ref 26.0–34.0)
MCHC: 32.4 g/dL (ref 30.0–36.0)
MCV: 93.7 fL (ref 80.0–100.0)
Monocytes Absolute: 0.2 10*3/uL (ref 0.1–1.0)
Monocytes Relative: 4 %
Neutro Abs: 3 10*3/uL (ref 1.7–7.7)
Neutrophils Relative %: 57 %
Platelet Count: 174 10*3/uL (ref 150–400)
RBC: 3.63 MIL/uL — ABNORMAL LOW (ref 3.87–5.11)
RDW: 14.9 % (ref 11.5–15.5)
WBC Count: 5.2 10*3/uL (ref 4.0–10.5)
nRBC: 0 % (ref 0.0–0.2)

## 2023-03-07 LAB — CMP (CANCER CENTER ONLY)
ALT: 31 U/L (ref 0–44)
AST: 22 U/L (ref 15–41)
Albumin: 3.9 g/dL (ref 3.5–5.0)
Alkaline Phosphatase: 64 U/L (ref 38–126)
Anion gap: 8 (ref 5–15)
BUN: 12 mg/dL (ref 6–20)
CO2: 24 mmol/L (ref 22–32)
Calcium: 9 mg/dL (ref 8.9–10.3)
Chloride: 105 mmol/L (ref 98–111)
Creatinine: 0.56 mg/dL (ref 0.44–1.00)
GFR, Estimated: >60 mL/min (ref >60)
Glucose, Bld: 143 mg/dL — ABNORMAL HIGH (ref 70–99)
Potassium: 3.7 mmol/L (ref 3.5–5.1)
Sodium: 137 mmol/L (ref 135–145)
Total Bilirubin: 0.5 mg/dL (ref 0.0–1.2)
Total Protein: 6.9 g/dL (ref 6.5–8.1)

## 2023-03-07 MED ORDER — FAMOTIDINE IN NACL 20-0.9 MG/50ML-% IV SOLN
20.0000 mg | Freq: Once | INTRAVENOUS | Status: AC
  Administered 2023-03-07: 20 mg via INTRAVENOUS
  Filled 2023-03-07: qty 50

## 2023-03-07 MED ORDER — DIPHENHYDRAMINE HCL 50 MG/ML IJ SOLN
50.0000 mg | Freq: Once | INTRAMUSCULAR | Status: AC
  Administered 2023-03-07: 50 mg via INTRAVENOUS
  Filled 2023-03-07: qty 1

## 2023-03-07 MED ORDER — SODIUM CHLORIDE 0.9 % IV SOLN
Freq: Once | INTRAVENOUS | Status: AC
  Filled 2023-03-07: qty 250

## 2023-03-07 MED ORDER — SODIUM CHLORIDE 0.9 % IV SOLN
80.0000 mg/m2 | Freq: Once | INTRAVENOUS | Status: AC
  Administered 2023-03-07: 126 mg via INTRAVENOUS
  Filled 2023-03-07: qty 21

## 2023-03-07 MED ORDER — DEXAMETHASONE SODIUM PHOSPHATE 10 MG/ML IJ SOLN
10.0000 mg | Freq: Once | INTRAMUSCULAR | Status: AC
  Administered 2023-03-07: 10 mg via INTRAVENOUS
  Filled 2023-03-07: qty 1

## 2023-03-07 NOTE — Patient Instructions (Signed)
[facility] - A DEPT OF [facility]  Discharge Instructions: Thank you for choosing [facility] to provide your oncology and hematology care.  If you have a lab appointment with the Cancer Center, please go directly to the Cancer Center and check in at the registration area.  Wear comfortable clothing and clothing appropriate for easy access to any Portacath or PICC line.   We strive to give you quality time with your provider. You may need to reschedule your appointment if you arrive late (15 or more minutes).  Arriving late affects you and other patients whose appointments are after yours.  Also, if you miss three or more appointments without notifying the office, you may be dismissed from the clinic at the provider's discretion.      For prescription refill requests, have your pharmacy contact our office and allow 72 hours for refills to be completed.    Today you received the following chemotherapy and/or immunotherapy agents Taxol.      To help prevent nausea and vomiting after your treatment, we encourage you to take your nausea medication as directed.  BELOW ARE SYMPTOMS THAT SHOULD BE REPORTED IMMEDIATELY: *FEVER GREATER THAN 100.4 F (38 C) OR HIGHER *CHILLS OR SWEATING *NAUSEA AND VOMITING THAT IS NOT CONTROLLED WITH YOUR NAUSEA MEDICATION *UNUSUAL SHORTNESS OF BREATH *UNUSUAL BRUISING OR BLEEDING *URINARY PROBLEMS (pain or burning when urinating, or frequent urination) *BOWEL PROBLEMS (unusual diarrhea, constipation, pain near the anus) TENDERNESS IN MOUTH AND THROAT WITH OR WITHOUT PRESENCE OF ULCERS (sore throat, sores in mouth, or a toothache) UNUSUAL RASH, SWELLING OR PAIN  UNUSUAL VAGINAL DISCHARGE OR ITCHING   Items with * indicate a potential emergency and should be followed up as soon as possible or go to the Emergency Department if any problems should occur.  Please show the CHEMOTHERAPY ALERT CARD or IMMUNOTHERAPY ALERT  CARD at check-in to the Emergency Department and triage nurse.  Should you have questions after your visit or need to cancel or reschedule your appointment, please contact [facility] - A DEPT OF [facility]  [phone] and follow the prompts.  Office hours are 8:00 a.m. to 4:30 p.m. Monday - Friday. Please note that voicemails left after 4:00 p.m. may not be returned until the following business day.  We are closed weekends and major holidays. You have access to a nurse at all times for urgent questions. Please call the main number to the clinic [phone] and follow the prompts.  For any non-urgent questions, you may also contact your provider using MyChart. We now offer e-Visits for anyone 82 and older to request care online for non-urgent symptoms. For details visit [email or web address].   Also download the MyChart app! Go to the app store, search "MyChart", open the app, select [facility], and log in with your MyChart username and password.

## 2023-03-10 ENCOUNTER — Ambulatory Visit
Admission: RE | Admit: 2023-03-10 | Discharge: 2023-03-10 | Disposition: A | Discharge status: Home / Self Care | Payer: Medicaid Other | Source: Ambulatory Visit | Attending: Oncology | Admitting: Oncology

## 2023-03-10 ENCOUNTER — Inpatient Hospital Stay: Payer: Medicaid Other

## 2023-03-10 DIAGNOSIS — C50919 Malignant neoplasm of unspecified site of unspecified female breast: Secondary | ICD-10-CM | POA: Insufficient documentation

## 2023-03-10 DIAGNOSIS — Z5112 Encounter for antineoplastic immunotherapy: Secondary | ICD-10-CM | POA: Diagnosis not present

## 2023-03-10 MED ORDER — GADOBUTROL 1 MMOL/ML IV SOLN
6.0000 mL | Freq: Once | INTRAVENOUS | Status: AC | PRN
  Administered 2023-03-10: 6 mL via INTRAVENOUS

## 2023-03-10 MED ORDER — FILGRASTIM-SNDZ 480 MCG/0.8ML IJ SOSY
480.0000 ug | PREFILLED_SYRINGE | Freq: Once | INTRAMUSCULAR | Status: AC
Start: 2023-03-10 — End: 2023-03-10
  Administered 2023-03-10: 480 ug via SUBCUTANEOUS
  Filled 2023-03-10: qty 0.8

## 2023-03-11 ENCOUNTER — Inpatient Hospital Stay: Payer: Medicaid Other

## 2023-03-11 DIAGNOSIS — C50919 Malignant neoplasm of unspecified site of unspecified female breast: Secondary | ICD-10-CM

## 2023-03-11 DIAGNOSIS — Z5112 Encounter for antineoplastic immunotherapy: Secondary | ICD-10-CM | POA: Diagnosis not present

## 2023-03-11 MED ORDER — FILGRASTIM-SNDZ 480 MCG/0.8ML IJ SOSY
480.0000 ug | PREFILLED_SYRINGE | Freq: Once | INTRAMUSCULAR | Status: AC
  Administered 2023-03-11: 480 ug via SUBCUTANEOUS
  Filled 2023-03-11: qty 0.8

## 2023-03-12 ENCOUNTER — Inpatient Hospital Stay: Payer: Medicaid Other

## 2023-03-12 DIAGNOSIS — Z5112 Encounter for antineoplastic immunotherapy: Secondary | ICD-10-CM | POA: Diagnosis not present

## 2023-03-12 DIAGNOSIS — C50919 Malignant neoplasm of unspecified site of unspecified female breast: Secondary | ICD-10-CM

## 2023-03-12 MED ORDER — FILGRASTIM-SNDZ 480 MCG/0.8ML IJ SOSY
480.0000 ug | PREFILLED_SYRINGE | Freq: Once | INTRAMUSCULAR | Status: AC
  Administered 2023-03-12: 480 ug via SUBCUTANEOUS
  Filled 2023-03-12: qty 0.8

## 2023-03-13 ENCOUNTER — Encounter: Payer: Self-pay | Admitting: Oncology

## 2023-03-13 MED FILL — Fosaprepitant Dimeglumine For IV Infusion 150 MG (Base Eq): INTRAVENOUS | Qty: 5 | Status: AC

## 2023-03-13 NOTE — Assessment & Plan Note (Signed)
Chemotherapy plan as listed above

## 2023-03-13 NOTE — Assessment & Plan Note (Addendum)
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  Images and pathology results were reviewed and discussed with patient.  MRI showed  6.7 cm of abnormal enhancement, predominantly in the lower-inner inferior left breast, with the ultrasound-guided biopsy marking clip centrally positioned within the enhancement. Suspicious for involvement by cancer, cT3 disease.  Patient desires mastectomy. Patient has no desire of preserving fertility.  S/p Carboplatin, taxol Keytruda Q3 weeks x 4 cycle --MRI showed positive treatment response.  Labs are reviewed and discussed with patient. Proceed with AC + Keytrua with D4 GCSF

## 2023-03-13 NOTE — Assessment & Plan Note (Signed)
Continue Eliquis 5mg  BID

## 2023-03-14 ENCOUNTER — Inpatient Hospital Stay: Payer: Medicaid Other

## 2023-03-14 ENCOUNTER — Encounter: Payer: Self-pay | Admitting: Oncology

## 2023-03-14 ENCOUNTER — Inpatient Hospital Stay (HOSPITAL_BASED_OUTPATIENT_CLINIC_OR_DEPARTMENT_OTHER): Payer: Medicaid Other | Admitting: Oncology

## 2023-03-14 VITALS — BP 130/77 | HR 79 | Temp 96.0°F | Resp 18 | Wt 149.6 lb

## 2023-03-14 DIAGNOSIS — C50919 Malignant neoplasm of unspecified site of unspecified female breast: Secondary | ICD-10-CM

## 2023-03-14 DIAGNOSIS — Z79899 Other long term (current) drug therapy: Secondary | ICD-10-CM

## 2023-03-14 DIAGNOSIS — D500 Iron deficiency anemia secondary to blood loss (chronic): Secondary | ICD-10-CM

## 2023-03-14 DIAGNOSIS — Z5181 Encounter for therapeutic drug level monitoring: Secondary | ICD-10-CM | POA: Diagnosis not present

## 2023-03-14 DIAGNOSIS — Z5111 Encounter for antineoplastic chemotherapy: Secondary | ICD-10-CM | POA: Diagnosis not present

## 2023-03-14 DIAGNOSIS — I2699 Other pulmonary embolism without acute cor pulmonale: Secondary | ICD-10-CM

## 2023-03-14 DIAGNOSIS — Z5112 Encounter for antineoplastic immunotherapy: Secondary | ICD-10-CM | POA: Diagnosis not present

## 2023-03-14 LAB — CBC WITH DIFFERENTIAL (CANCER CENTER ONLY)
Abs Immature Granulocytes: 1.72 10*3/uL — ABNORMAL HIGH (ref 0.00–0.07)
Basophils Absolute: 0 10*3/uL (ref 0.0–0.1)
Basophils Relative: 0 %
Eosinophils Absolute: 0.1 10*3/uL (ref 0.0–0.5)
Eosinophils Relative: 0 %
HCT: 35.6 % — ABNORMAL LOW (ref 36.0–46.0)
Hemoglobin: 11.6 g/dL — ABNORMAL LOW (ref 12.0–15.0)
Immature Granulocytes: 14 %
Lymphocytes Relative: 22 %
Lymphs Abs: 2.7 10*3/uL (ref 0.7–4.0)
MCH: 30.8 pg (ref 26.0–34.0)
MCHC: 32.6 g/dL (ref 30.0–36.0)
MCV: 94.4 fL (ref 80.0–100.0)
Monocytes Absolute: 0.8 10*3/uL (ref 0.1–1.0)
Monocytes Relative: 7 %
Neutro Abs: 6.8 10*3/uL (ref 1.7–7.7)
Neutrophils Relative %: 57 %
Platelet Count: 134 10*3/uL — ABNORMAL LOW (ref 150–400)
RBC: 3.77 MIL/uL — ABNORMAL LOW (ref 3.87–5.11)
RDW: 15.8 % — ABNORMAL HIGH (ref 11.5–15.5)
Smear Review: NORMAL
WBC Count: 12.1 10*3/uL — ABNORMAL HIGH (ref 4.0–10.5)
nRBC: 0.2 % (ref 0.0–0.2)

## 2023-03-14 LAB — CMP (CANCER CENTER ONLY)
ALT: 32 U/L (ref 0–44)
AST: 25 U/L (ref 15–41)
Albumin: 3.9 g/dL (ref 3.5–5.0)
Alkaline Phosphatase: 90 U/L (ref 38–126)
Anion gap: 6 (ref 5–15)
BUN: 8 mg/dL (ref 6–20)
CO2: 25 mmol/L (ref 22–32)
Calcium: 8.9 mg/dL (ref 8.9–10.3)
Chloride: 108 mmol/L (ref 98–111)
Creatinine: 0.51 mg/dL (ref 0.44–1.00)
GFR, Estimated: >60 mL/min (ref >60)
Glucose, Bld: 112 mg/dL — ABNORMAL HIGH (ref 70–99)
Potassium: 3.7 mmol/L (ref 3.5–5.1)
Sodium: 139 mmol/L (ref 135–145)
Total Bilirubin: 0.5 mg/dL (ref 0.0–1.2)
Total Protein: 7.2 g/dL (ref 6.5–8.1)

## 2023-03-14 MED ORDER — SODIUM CHLORIDE 0.9 % IV SOLN
Freq: Once | INTRAVENOUS | Status: AC
  Filled 2023-03-14: qty 250

## 2023-03-14 MED ORDER — DEXAMETHASONE SODIUM PHOSPHATE 10 MG/ML IJ SOLN
10.0000 mg | Freq: Once | INTRAMUSCULAR | Status: AC
Start: 2023-03-14 — End: 2023-03-14
  Administered 2023-03-14: 10 mg via INTRAVENOUS
  Filled 2023-03-14: qty 1

## 2023-03-14 MED ORDER — DOXORUBICIN HCL CHEMO IV INJECTION 2 MG/ML
60.0000 mg/m2 | Freq: Once | INTRAVENOUS | Status: AC
  Administered 2023-03-14: 96 mg via INTRAVENOUS
  Filled 2023-03-14: qty 48

## 2023-03-14 MED ORDER — SODIUM CHLORIDE 0.9 % IV SOLN
200.0000 mg | Freq: Once | INTRAVENOUS | Status: AC
  Administered 2023-03-14: 200 mg via INTRAVENOUS
  Filled 2023-03-14: qty 200

## 2023-03-14 MED ORDER — SODIUM CHLORIDE 0.9 % IV SOLN
600.0000 mg/m2 | Freq: Once | INTRAVENOUS | Status: AC
  Administered 2023-03-14: 1000 mg via INTRAVENOUS
  Filled 2023-03-14: qty 50

## 2023-03-14 MED ORDER — PALONOSETRON HCL INJECTION 0.25 MG/5ML
0.2500 mg | Freq: Once | INTRAVENOUS | Status: AC
  Administered 2023-03-14: 0.25 mg via INTRAVENOUS
  Filled 2023-03-14: qty 5

## 2023-03-14 MED ORDER — HEPARIN SOD (PORK) LOCK FLUSH 100 UNIT/ML IV SOLN
500.0000 [IU] | Freq: Once | INTRAVENOUS | Status: AC | PRN
  Administered 2023-03-14: 500 [IU]
  Filled 2023-03-14: qty 5

## 2023-03-14 MED ORDER — SODIUM CHLORIDE 0.9 % IV SOLN
150.0000 mg | Freq: Once | INTRAVENOUS | Status: AC
  Administered 2023-03-14: 150 mg via INTRAVENOUS
  Filled 2023-03-14: qty 150

## 2023-03-14 NOTE — Progress Notes (Signed)
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  Images and pathology results were reviewed and discussed with patient.  MRI showed  6.7 cm of abnormal enhancement, predominantly in the lower-inner inferior left breast, with the ultrasound-guided biopsy marking clip centrally positioned within the enhancement. Suspicious for involvement by cancer, cT3 disease.  Patient desires mastectomy. Patient has no desire of preserving fertility.  S/p Carboplatin, taxol Keytruda Q3 weeks x 4 cycle --MRI showed positive treatment response.  Labs are reviewed and discussed with patient. Proceed with AC + Keytrua with D4 GCSF    Acute pulmonary embolism (HCC) Continue Eliquis 5mg  BID  Encounter for antineoplastic chemotherapy Chemotherapy plan as listed above.   IDA (iron deficiency anemia) Labs are reviewed and discussed with patient. Lab Results  Component Value Date   HGB 11.6 (L) 03/14/2023   TIBC 377 02/28/2023   IRONPCTSAT 11 02/28/2023   FERRITIN 23 02/28/2023   Hemoglobin is stable. IV venofer as needed.    Orders Placed This Encounter  Procedures   ECHOCARDIOGRAM COMPLETE    Standing Status:   Future    Expected Date:   03/21/2023    Expiration Date:   03/13/2024    Where should this test be performed:   [facility]    Perflutren DEFINITY (image enhancing agent) should be administered unless hypersensitivity or allergy exist:   Administer Perflutren    Reason for exam-Echo:   Chemo  Z09   Follow-up in 3 weeks.   All questions were answered.  The patient knows to call the clinic with any problems, questions or concerns.  [name], MD, PhD [facility] Hematology Oncology 03/14/2023    HISTORY OF PRESENTING ILLNESS:  HEPHZIBAH Gomez 38 y.o. female presents to establish care for left triple negative breast cancer I have reviewed her chart and materials related to her cancer extensively and collaborated history with the patient. Summary of oncologic history is as follows: Oncology History  Invasive carcinoma of breast (HCC)  07/24/2022 Mammogram   She noticed left breast mass for 1 month.  Bilateral diagnostic mammogram showed Suspicious palpable left breast mass 8 o'clock position. Cortically thickened left axillary lymph node   10/10/2022 Initial Diagnosis   Invasive carcinoma of breast (HCC)  10/02/2022  Left breast mass biopsy and left axillary lymph node biopsy.  Diagnosis 1. Breast, left, needle core biopsy, 8 o'clock 6 cmfn, heart clip - INVASIVE MAMMARY CARCINOMA, NO SPECIAL TYPE. - TUBULE FORMATION: SCORE 3 - NUCLEAR PLEOMORPHISM: SCORE 3 - MITOTIC COUNT: SCORE 3 - TOTAL SCORE: 9 - OVERALL GRADE: 3 - LYMPHOVASCULAR INVASION: NOT IDENTIFIED - CANCER LENGTH: 11 MM - CALCIFICATIONS: NOT IDENTIFIED - DUCTAL CARCINOMA IN SITU: PRESENT, HIGH-GRADE - ER-, PR- HER2 - [IHC 1+] 2. Lymph node, needle/core biopsy, left axillary, hydromark - LYMPH NODE WITH REACTIVE CHANGES; NEGATIVE FOR MALIGNANCY.  Menarche at age of 51 First live birth at age of 22 OCP use: no History of hysterectomy: no Menopausal status: premenopausal History of HRT use: no History of chest radiation: no Number of previous breast biopsies:  right breast biopsy 05/20/2019  negative for malignancy Strong family history of cancer mother breast cancer diagnosed in 61s, maternal aunts x 2 breast cancer, maternal cousins breast cancer x 2, maternal grandmother pancreatic cancer.    10/10/2022 Cancer Staging   Staging form: Breast, AJCC 8th Edition -  Clinical stage from 10/10/2022: Stage IIIB (cT3, cN0, cM0, G3, ER-, PR-, HER2-) - Signed by [name], MD on 10/18/2022 Stage prefix: Initial diagnosis Nuclear grade: G3 Histologic grading system: 3 grade system   10/17/2022 Imaging   Bilateral MRI breasts w wo contrast  1. In total, there is approximately 6.7 cm of abnormal enhancement, predominantly in the lower-inner inferior left breast, with the ultrasound-guided biopsy marking clip centrally positioned within the enhancement. There are scattered small enhancing masses in the lower inner and lower outer anterior left (included in the measurement above).   2.  No evidence of right breast malignancy.   3. The axillary lymph nodes are not included within the field of view of this MRI for adequate assessment of lymphadenopathy.    10/17/2022 Procedure   S/p medi port placement.    10/25/2022 - 10/25/2022 Chemotherapy   Patient is on Treatment Plan : BREAST ADJUVANT DOSE DENSE AC q14d / PACLitaxel q7d     10/25/2022 -  Chemotherapy   Patient is on Treatment Plan : BREAST Pembrolizumab (200) D1 + Carboplatin (5) D1 + Paclitaxel (80) D1,8,15 q21d X 4 cycles / Pembrolizumab (200) D1 + AC D1 q21d x 4 cycles      Genetic Testing   Single pathogenic variant in BRCA1 called c.190T>G identified on the [facility] Multi-Cancer+RNA panel. The remainder of testing was normal. The report date is 11/11/2022.  The Multi-Cancer + RNA Panel offered by [facility] includes sequencing and/or deletion/duplication analysis of the following 70 genes:  AIP*, ALK, APC*, ATM*, AXIN2*, BAP1*, BARD1*, BLM*, BMPR1A*, BRCA1*, BRCA2*, BRIP1*, CDC73*, CDH1*, CDK4, CDKN1B*, CDKN2A, CHEK2*, CTNNA1*, DICER1*, EPCAM, EGFR, FH*, FLCN*, GREM1, HOXB13, KIT, LZTR1, MAX*, MBD4, MEN1*, MET, MITF, MLH1*, MSH2*, MSH3*, MSH6*, MUTYH*, NF1*, NF2*, NTHL1*, PALB2*, PDGFRA, PMS2*, POLD1*, POLE*, POT1*, PRKAR1A*, PTCH1*, PTEN*, RAD51C*, RAD51D*, RB1*, RET, SDHA*, SDHAF2*, SDHB*, SDHC*, SDHD*, SMAD4*,  SMARCA4*, SMARCB1*, SMARCE1*, STK11*, SUFU*, TMEM127*, TP53*, TSC1*, TSC2*, VHL*. RNA analysis is performed for * genes.   11/24/2022 - 11/25/2022 Hospital Admission   Admitted due to acute pulmonary embolism, lower extremity US negative for DVT.  Patietn was started on heparin gtt and transitioned to Eliquis at discharge.    02/10/2023 Procedure   S/p port placement.    03/10/2023 Imaging   MRI bilateral breast w wo contrast  No abnormal enhancement in the left breast consistent with response to neoadjuvant chemotherapy.    Today she presents for evaluation prior to chemotherapy She tolerates chemotherapy.  On Eliquis 5mg  BID for PE. She tolerates well.  No fever chills.      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   Vascular port complication  SURGICAL HISTORY: Past Surgical History:  Procedure Laterality Date   BREAST BIOPSY Right 05/20/2019   Affirm Bx- X- clip, neg   BREAST BIOPSY Left 10/02/2022   [place] Bx, path pending   BREAST BIOPSY Left 10/02/2022   [place] Bx Node- path pending   BREAST BIOPSY Left 10/02/2022   [place] LT BREAST BX W LOC DEV 1ST LESION IMG BX SPEC US GUIDE 10/02/2022 [facility]   CESAREAN SECTION     LAPAROSCOPIC BILATERAL SALPINGECTOMY Bilateral 02/09/2022   Procedure: LAPAROSCOPIC BILATERAL SALPINGECTOMY;  Surgeon: Hildred Laser, MD;  Location: [facility];  Service: Gynecology;  Laterality: Bilateral;   PORTACATH PLACEMENT Right 10/17/2022   Procedure: INSERTION PORT-A-CATH;  Surgeon: Henrene Dodge, MD;   Location: [facility];  Service: General;  Laterality: Right;   PORTACATH PLACEMENT Left 02/10/2023   Procedure: INSERTION PORT-A-CATH;  Surgeon: Sung Amabile, DO;  Location: [facility];  Service: General;  Laterality: Left;   TEE WITHOUT CARDIOVERSION N/A 01/02/2023   Procedure: TRANSESOPHAGEAL ECHOCARDIOGRAM (TEE);  Surgeon: Antonieta Iba, MD;  Location: [facility];  Service: Cardiovascular;  Laterality: N/A;   TUBAL LIGATION      SOCIAL HISTORY: Social History   Socioeconomic History   Marital status: Single    Spouse name: Not on file   Number of children: 2   Years of education: Not on file   Highest education level: High school graduate  Occupational History   Not on file  Tobacco Use   Smoking status: Never    Passive exposure: Never   Smokeless tobacco: Never  Vaping Use   Vaping status: Never Used  Substance and Sexual Activity   Alcohol use: Yes    Comment: occ   Drug use: No   Sexual activity: Yes    Birth control/protection: Surgical  Other Topics Concern   Not on file  Social History Narrative   Not on file   Social Drivers of Health   Financial Resource Strain: Low Risk  (01/20/2023)   Received from [facility]   Overall Financial Resource Strain (CARDIA)    Difficulty of Paying Living Expenses: Not hard at all  Food Insecurity: No Food Insecurity (01/20/2023)   Received from [facility]   Hunger Vital Sign    Worried About Running Out of Food in the Last Year: Never true    Ran Out of Food in the Last Year: Never true  Transportation Needs: No Transportation Needs (01/20/2023)   [facility] - Transportation    In the past 12 months, has lack of transportation kept you from medical appointments or from getting medications?: No    Lack of Transportation (Non-Medical): No  Physical Activity: Not on file  Stress: No Stress Concern Present (10/10/2022)   [employer] of  Occupational Health - Occupational Stress Questionnaire    Feeling of Stress : Only a little  Social Connections: Not on file  Intimate Partner Violence: Not At Risk (12/31/2022)   Humiliation, Afraid, Rape, and Kick questionnaire    Fear of Current or Ex-Partner: No    Emotionally Abused: No    Physically Abused: No    Sexually Abused: No    FAMILY HISTORY: Family History  Problem Relation Age of Onset   Breast cancer Mother 61       no GT   Breast cancer Maternal Aunt 33   Breast cancer Maternal Aunt    Lung cancer Maternal Uncle    Lung cancer  Maternal Uncle    Bone cancer Paternal Aunt    Pancreatic cancer Maternal Grandmother 46   Lung cancer Maternal Grandfather 70   Breast cancer Cousin 40   Breast cancer Other 33       gene pos?    ALLERGIES:  has no known allergies.  MEDICATIONS:  Current Outpatient Medications  Medication Sig Dispense Refill   acetaminophen (TYLENOL) 500 MG tablet Take 2 tablets (1,000 mg total) by mouth every 6 (six) hours as needed for mild pain.     apixaban (ELIQUIS) 5 MG TABS tablet Take 1 tablet (5 mg total) by mouth 2 (two) times daily. Start AFTER completing your starter pack 60 tablet 1   HYDROcodone-acetaminophen (NORCO) 5-325 MG tablet Take 1 tablet by mouth every 6 (six) hours as needed for up to 6 doses for moderate pain (pain score 4-6). 6 tablet 0   senna (SENOKOT) 8.6 MG TABS tablet Take 2 tablets (17.2 mg total) by mouth daily. 120 tablet 2   simethicone (MYLICON) 80 MG chewable tablet Chew 1 tablet (80 mg total) by mouth every 6 (six) hours as needed for flatulence. 30 tablet 2   lidocaine-prilocaine (EMLA) cream Apply 1 Application topically as needed. (Patient not taking: Reported on 01/27/2023) 30 g 11   No current facility-administered medications for this visit.   Facility-Administered Medications Ordered in Other Visits  Medication Dose Route Frequency Provider Last Rate Last Admin   cyclophosphamide (CYTOXAN) 1,000 mg in  sodium chloride 0.9 % 250 mL chemo infusion  600 mg/m2 (Treatment Plan Recorded) Intravenous Once [name], MD       DOXOrubicin (ADRIAMYCIN) chemo injection 96 mg  60 mg/m2 (Treatment Plan Recorded) Intravenous Once [name], MD       fosaprepitant (EMEND) 150 mg in sodium chloride 0.9 % 145 mL IVPB  150 mg Intravenous Once [name], MD 450 mL/hr at 03/14/23 0947 150 mg at 03/14/23 0947   heparin lock flush 100 unit/mL  500 Units Intracatheter Once PRN [name], MD       pembrolizumab [facility]) 200 mg in sodium chloride 0.9 % 50 mL chemo infusion  200 mg Intravenous Once [name], MD        Review of Systems  Constitutional:  Negative for appetite change, chills, fatigue and fever.  HENT:   Negative for hearing loss and voice change.   Eyes:  Negative for eye problems.  Respiratory:  Negative for chest tightness and cough.   Cardiovascular:  Negative for chest pain.  Gastrointestinal:  Negative for abdominal distention, abdominal pain and blood in stool.  Endocrine: Negative for hot flashes.  Genitourinary:  Positive for menstrual problem. Negative for difficulty urinating and frequency.   Musculoskeletal:  Negative for arthralgias.  Skin:  Negative for itching and rash.  Neurological:  Negative for extremity weakness and headaches.  Hematological:  Negative for adenopathy.  Psychiatric/Behavioral:  Negative for confusion.      PHYSICAL EXAMINATION: ECOG PERFORMANCE STATUS: 0 - Asymptomatic  Vitals:   03/14/23 0843  BP: 130/77  Pulse: 79  Resp: 18  Temp: (!) 96 F (35.6 C)  SpO2: 100%   Filed Weights   03/14/23 0843  Weight: 149 lb 9.6 oz (67.9 kg)    Physical Exam Constitutional:      General: She is not in acute distress.    Appearance: She is not diaphoretic.  HENT:     Head: Normocephalic and atraumatic.  Eyes:     General: No scleral icterus. Cardiovascular:  Rate and Rhythm: Normal rate and regular rhythm.  Pulmonary:     Effort: Pulmonary effort is  normal. No respiratory distress.     Breath sounds: No wheezing.  Abdominal:     General: Bowel sounds are normal. There is no distension.     Palpations: Abdomen is soft.  Musculoskeletal:        General: Normal range of motion.     Cervical back: Normal range of motion and neck supple.  Skin:    General: Skin is warm and dry.     Findings: No erythema.     Comments: Left anterior chest wall medi port  Neurological:     Mental Status: She is alert and oriented to person, place, and time. Mental status is at baseline.     Motor: No abnormal muscle tone.  Psychiatric:        Mood and Affect: Mood and affect normal.     LABORATORY DATA:  I have reviewed the data as listed    Latest Ref Rng & Units 03/14/2023    8:33 AM 03/07/2023    8:13 AM 02/28/2023    8:50 AM  CBC  WBC 4.0 - 10.5 K/uL 12.1  5.2  4.3   Hemoglobin 12.0 - 15.0 g/dL 16.1  09.6  9.9   Hematocrit 36.0 - 46.0 % 35.6  34.0  30.5   Platelets 150 - 400 K/uL 134  174  181       Latest Ref Rng & Units 03/14/2023    8:33 AM 03/07/2023    8:13 AM 02/28/2023    8:50 AM  CMP  Glucose 70 - 99 mg/dL 045  409  92   BUN 6 - 20 mg/dL 8  12  10    Creatinine 0.44 - 1.00 mg/dL 8.11  9.14  7.82   Sodium 135 - 145 mmol/L 139  137  136   Potassium 3.5 - 5.1 mmol/L 3.7  3.7  3.8   Chloride 98 - 111 mmol/L 108  105  105   CO2 22 - 32 mmol/L 25  24  24    Calcium 8.9 - 10.3 mg/dL 8.9  9.0  8.7   Total Protein 6.5 - 8.1 g/dL 7.2  6.9  6.7   Total Bilirubin 0.0 - 1.2 mg/dL 0.5  0.5  0.4   Alkaline Phos 38 - 126 U/L 90  64  67   AST 15 - 41 U/L 25  22  29    ALT 0 - 44 U/L 32  31  44      RADIOGRAPHIC STUDIES: I have personally reviewed the radiological images as listed and agreed with the findings in the report. MR BREAST BILATERAL W WO CONTRAST INC CAD Result Date: 03/10/2023 CLINICAL DATA:  38 year old female with biopsy proven triple negative left breast cancer. Assess response to neoadjuvant chemotherapy. Patient desires  mastectomy. Previous MRI dated 10/17/2022 demonstrated 6.7 cm of abnormal enhancement in the lower-inner quadrant of the left breast. EXAM: BILATERAL BREAST MRI WITH AND WITHOUT CONTRAST TECHNIQUE: Multiplanar, multisequence MR images of both breasts were obtained prior to and following the intravenous administration of 6 ml of Gadavist Three-dimensional MR images were rendered by post-processing of the original MR data on an independent workstation. The three-dimensional MR images were interpreted, and findings are reported in the following complete MRI report for this study. Three dimensional images were evaluated at the independent interpreting workstation using the DynaCAD thin client. COMPARISON:  Previous exam(s) including MRI dated 10/17/2022. FINDINGS:  Breast composition: c. Heterogeneous fibroglandular tissue. Background parenchymal enhancement: Mild Right breast: No mass or abnormal enhancement. Left breast: There is a signal void artifact in the lower-inner quadrant of the left breast from the biopsy clip. There is no mass or abnormal enhancement. Previously seen enhancement spanning 6.7 cm has resolved. Lymph nodes: No abnormal appearing lymph nodes. Ancillary findings:  None. IMPRESSION: No abnormal enhancement in the left breast consistent with response to neoadjuvant chemotherapy. RECOMMENDATION: Continued treatment planning of the known triple negative left breast cancer is recommended. BI-RADS CATEGORY  6: Known biopsy-proven malignancy. Electronically Signed   By: Baird Lyons M.D.   On: 03/10/2023 10:37

## 2023-03-14 NOTE — Patient Instructions (Signed)
[facility]  Discharge Instructions: Thank you for choosing [facility] to provide your oncology and hematology care.  If you have a lab appointment with the Cancer Center, please go directly to the Cancer Center and check in at the registration area.  Wear comfortable clothing and clothing appropriate for easy access to any Portacath or PICC line.   We strive to give you quality time with your provider. You may need to reschedule your appointment if you arrive late (15 or more minutes).  Arriving late affects you and other patients whose appointments are after yours.  Also, if you miss three or more appointments without notifying the office, you may be dismissed from the clinic at the provider's discretion.      For prescription refill requests, have your pharmacy contact our office and allow 72 hours for refills to be completed.    Today you received the following chemotherapy and/or immunotherapy agents Cytoxan and Adriamycin.      To help prevent nausea and vomiting after your treatment, we encourage you to take your nausea medication as directed.  BELOW ARE SYMPTOMS THAT SHOULD BE REPORTED IMMEDIATELY: *FEVER GREATER THAN 100.4 F (38 C) OR HIGHER *CHILLS OR SWEATING *NAUSEA AND VOMITING THAT IS NOT CONTROLLED WITH YOUR NAUSEA MEDICATION *UNUSUAL SHORTNESS OF BREATH *UNUSUAL BRUISING OR BLEEDING *URINARY PROBLEMS (pain or burning when urinating, or frequent urination) *BOWEL PROBLEMS (unusual diarrhea, constipation, pain near the anus) TENDERNESS IN MOUTH AND THROAT WITH OR WITHOUT PRESENCE OF ULCERS (sore throat, sores in mouth, or a toothache) UNUSUAL RASH, SWELLING OR PAIN  UNUSUAL VAGINAL DISCHARGE OR ITCHING   Items with * indicate a potential emergency and should be followed up as soon as possible or go to the Emergency Department if any problems should occur.  Please show the CHEMOTHERAPY ALERT CARD or  IMMUNOTHERAPY ALERT CARD at check-in to the Emergency Department and triage nurse.  Should you have questions after your visit or need to cancel or reschedule your appointment, please contact [facility] - A DEPT OF [facility]  [phone] and follow the prompts.  Office hours are 8:00 a.m. to 4:30 p.m. Monday - Friday. Please note that voicemails left after 4:00 p.m. may not be returned until the following business day.  We are closed weekends and major holidays. You have access to a nurse at all times for urgent questions. Please call the main number to the clinic [phone] and follow the prompts.  For any non-urgent questions, you may also contact your provider using MyChart. We now offer e-Visits for anyone 97 and older to request care online for non-urgent symptoms. For details visit [email or web address].   Also download the MyChart app! Go to the app store, search "MyChart", open the app, select [facility], and log in with your MyChart username and password.

## 2023-03-14 NOTE — Assessment & Plan Note (Signed)
Labs are reviewed and discussed with patient. Lab Results  Component Value Date   HGB 11.6 (L) 03/14/2023   TIBC 377 02/28/2023   IRONPCTSAT 11 02/28/2023   FERRITIN 23 02/28/2023   Hemoglobin is stable. IV venofer as needed.

## 2023-03-17 ENCOUNTER — Inpatient Hospital Stay: Payer: Medicaid Other | Attending: Oncology

## 2023-03-17 DIAGNOSIS — Z171 Estrogen receptor negative status [ER-]: Secondary | ICD-10-CM | POA: Insufficient documentation

## 2023-03-17 DIAGNOSIS — Z1722 Progesterone receptor negative status: Secondary | ICD-10-CM | POA: Insufficient documentation

## 2023-03-17 DIAGNOSIS — C50919 Malignant neoplasm of unspecified site of unspecified female breast: Secondary | ICD-10-CM

## 2023-03-17 DIAGNOSIS — Z1732 Human epidermal growth factor receptor 2 negative status: Secondary | ICD-10-CM | POA: Insufficient documentation

## 2023-03-17 DIAGNOSIS — Z5112 Encounter for antineoplastic immunotherapy: Secondary | ICD-10-CM | POA: Diagnosis present

## 2023-03-17 DIAGNOSIS — C50312 Malignant neoplasm of lower-inner quadrant of left female breast: Secondary | ICD-10-CM | POA: Diagnosis present

## 2023-03-17 DIAGNOSIS — Z5189 Encounter for other specified aftercare: Secondary | ICD-10-CM | POA: Diagnosis not present

## 2023-03-17 DIAGNOSIS — Z5111 Encounter for antineoplastic chemotherapy: Secondary | ICD-10-CM | POA: Insufficient documentation

## 2023-03-17 MED ORDER — PEGFILGRASTIM-CBQV 6 MG/0.6ML ~~LOC~~ SOSY
6.0000 mg | PREFILLED_SYRINGE | Freq: Once | SUBCUTANEOUS | Status: AC
  Administered 2023-03-17: 6 mg via SUBCUTANEOUS
  Filled 2023-03-17: qty 0.6

## 2023-03-18 ENCOUNTER — Other Ambulatory Visit: Payer: Self-pay

## 2023-03-21 ENCOUNTER — Ambulatory Visit
Admission: RE | Admit: 2023-03-21 | Discharge: 2023-03-21 | Disposition: A | Discharge status: Home / Self Care | Payer: Medicaid Other | Source: Ambulatory Visit | Attending: Oncology | Admitting: Oncology

## 2023-03-21 DIAGNOSIS — Z79899 Other long term (current) drug therapy: Secondary | ICD-10-CM | POA: Diagnosis not present

## 2023-03-21 DIAGNOSIS — Z5181 Encounter for therapeutic drug level monitoring: Secondary | ICD-10-CM

## 2023-03-21 LAB — ECHOCARDIOGRAM COMPLETE
AR max vel: 2.31 cm2
AV Area VTI: 2.39 cm2
AV Area mean vel: 2.3 cm2
AV Mean grad: 3 mm[Hg]
AV Peak grad: 5 mm[Hg]
Ao pk vel: 1.12 m/s
Area-P 1/2: 3.53 cm2
S' Lateral: 1.5 cm

## 2023-03-21 NOTE — Progress Notes (Signed)
*  PRELIMINARY RESULTS* Echocardiogram 2D Echocardiogram has been performed.  Penny Gomez 03/21/2023, 10:04 AM

## 2023-04-03 MED FILL — Fosaprepitant Dimeglumine For IV Infusion 150 MG (Base Eq): INTRAVENOUS | Qty: 5 | Status: AC

## 2023-04-04 ENCOUNTER — Inpatient Hospital Stay: Payer: Medicaid Other | Admitting: Oncology

## 2023-04-04 ENCOUNTER — Encounter: Payer: Self-pay | Admitting: Oncology

## 2023-04-04 ENCOUNTER — Other Ambulatory Visit: Payer: Self-pay

## 2023-04-04 ENCOUNTER — Inpatient Hospital Stay: Payer: Medicaid Other

## 2023-04-04 ENCOUNTER — Emergency Department: Payer: Medicaid Other

## 2023-04-04 ENCOUNTER — Emergency Department
Admission: EM | Admit: 2023-04-04 | Discharge: 2023-04-05 | Disposition: A | Discharge status: Home / Self Care | Payer: Medicaid Other | Attending: Emergency Medicine | Admitting: Emergency Medicine

## 2023-04-04 VITALS — BP 118/67 | HR 71 | Temp 97.5°F | Resp 18 | Wt 150.9 lb

## 2023-04-04 DIAGNOSIS — R002 Palpitations: Secondary | ICD-10-CM | POA: Insufficient documentation

## 2023-04-04 DIAGNOSIS — R000 Tachycardia, unspecified: Secondary | ICD-10-CM | POA: Diagnosis present

## 2023-04-04 DIAGNOSIS — Z1509 Genetic susceptibility to other malignant neoplasm: Secondary | ICD-10-CM

## 2023-04-04 DIAGNOSIS — Z5111 Encounter for antineoplastic chemotherapy: Secondary | ICD-10-CM | POA: Diagnosis not present

## 2023-04-04 DIAGNOSIS — Z1501 Genetic susceptibility to malignant neoplasm of breast: Secondary | ICD-10-CM

## 2023-04-04 DIAGNOSIS — C50919 Malignant neoplasm of unspecified site of unspecified female breast: Secondary | ICD-10-CM | POA: Diagnosis not present

## 2023-04-04 DIAGNOSIS — I2699 Other pulmonary embolism without acute cor pulmonale: Secondary | ICD-10-CM

## 2023-04-04 DIAGNOSIS — D649 Anemia, unspecified: Secondary | ICD-10-CM | POA: Insufficient documentation

## 2023-04-04 DIAGNOSIS — Z5112 Encounter for antineoplastic immunotherapy: Secondary | ICD-10-CM | POA: Diagnosis not present

## 2023-04-04 LAB — COMPREHENSIVE METABOLIC PANEL
ALT: 36 U/L (ref 0–44)
AST: 29 U/L (ref 15–41)
Albumin: 4.2 g/dL (ref 3.5–5.0)
Alkaline Phosphatase: 80 U/L (ref 38–126)
Anion gap: 11 (ref 5–15)
BUN: 11 mg/dL (ref 6–20)
CO2: 21 mmol/L — ABNORMAL LOW (ref 22–32)
Calcium: 9.5 mg/dL (ref 8.9–10.3)
Chloride: 105 mmol/L (ref 98–111)
Creatinine, Ser: 0.57 mg/dL (ref 0.44–1.00)
GFR, Estimated: >60 mL/min (ref >60)
Glucose, Bld: 135 mg/dL — ABNORMAL HIGH (ref 70–99)
Potassium: 4 mmol/L (ref 3.5–5.1)
Sodium: 137 mmol/L (ref 135–145)
Total Bilirubin: 0.5 mg/dL (ref 0.0–1.2)
Total Protein: 7.4 g/dL (ref 6.5–8.1)

## 2023-04-04 LAB — CBC WITH DIFFERENTIAL (CANCER CENTER ONLY)
Abs Immature Granulocytes: 0.04 10*3/uL (ref 0.00–0.07)
Basophils Absolute: 0 10*3/uL (ref 0.0–0.1)
Basophils Relative: 1 %
Eosinophils Absolute: 0 10*3/uL (ref 0.0–0.5)
Eosinophils Relative: 0 %
HCT: 34.4 % — ABNORMAL LOW (ref 36.0–46.0)
Hemoglobin: 11.4 g/dL — ABNORMAL LOW (ref 12.0–15.0)
Immature Granulocytes: 1 %
Lymphocytes Relative: 26 %
Lymphs Abs: 1.6 10*3/uL (ref 0.7–4.0)
MCH: 31.3 pg (ref 26.0–34.0)
MCHC: 33.1 g/dL (ref 30.0–36.0)
MCV: 94.5 fL (ref 80.0–100.0)
Monocytes Absolute: 0.4 10*3/uL (ref 0.1–1.0)
Monocytes Relative: 6 %
Neutro Abs: 4 10*3/uL (ref 1.7–7.7)
Neutrophils Relative %: 66 %
Platelet Count: 201 10*3/uL (ref 150–400)
RBC: 3.64 MIL/uL — ABNORMAL LOW (ref 3.87–5.11)
RDW: 15.9 % — ABNORMAL HIGH (ref 11.5–15.5)
WBC Count: 6.1 10*3/uL (ref 4.0–10.5)
nRBC: 0.3 % — ABNORMAL HIGH (ref 0.0–0.2)

## 2023-04-04 LAB — CBC WITH DIFFERENTIAL/PLATELET
Abs Immature Granulocytes: 0.04 10*3/uL (ref 0.00–0.07)
Basophils Absolute: 0 10*3/uL (ref 0.0–0.1)
Basophils Relative: 0 %
Eosinophils Absolute: 0 10*3/uL (ref 0.0–0.5)
Eosinophils Relative: 0 %
HCT: 34.6 % — ABNORMAL LOW (ref 36.0–46.0)
Hemoglobin: 11.1 g/dL — ABNORMAL LOW (ref 12.0–15.0)
Immature Granulocytes: 0 %
Lymphocytes Relative: 7 %
Lymphs Abs: 0.7 10*3/uL (ref 0.7–4.0)
MCH: 30.7 pg (ref 26.0–34.0)
MCHC: 32.1 g/dL (ref 30.0–36.0)
MCV: 95.6 fL (ref 80.0–100.0)
Monocytes Absolute: 0.1 10*3/uL (ref 0.1–1.0)
Monocytes Relative: 1 %
Neutro Abs: 9.4 10*3/uL — ABNORMAL HIGH (ref 1.7–7.7)
Neutrophils Relative %: 92 %
Platelets: 248 10*3/uL (ref 150–400)
RBC: 3.62 MIL/uL — ABNORMAL LOW (ref 3.87–5.11)
RDW: 16 % — ABNORMAL HIGH (ref 11.5–15.5)
WBC: 10.3 10*3/uL (ref 4.0–10.5)
nRBC: 0.2 % (ref 0.0–0.2)

## 2023-04-04 LAB — TROPONIN I (HIGH SENSITIVITY): Troponin I (High Sensitivity): 4 ng/L (ref <18)

## 2023-04-04 LAB — TSH: TSH: 0.409 u[IU]/mL (ref 0.350–4.500)

## 2023-04-04 LAB — CMP (CANCER CENTER ONLY)
ALT: 32 U/L (ref 0–44)
AST: 27 U/L (ref 15–41)
Albumin: 4 g/dL (ref 3.5–5.0)
Alkaline Phosphatase: 84 U/L (ref 38–126)
Anion gap: 8 (ref 5–15)
BUN: 12 mg/dL (ref 6–20)
CO2: 24 mmol/L (ref 22–32)
Calcium: 8.9 mg/dL (ref 8.9–10.3)
Chloride: 106 mmol/L (ref 98–111)
Creatinine: 0.58 mg/dL (ref 0.44–1.00)
GFR, Estimated: >60 mL/min (ref >60)
Glucose, Bld: 138 mg/dL — ABNORMAL HIGH (ref 70–99)
Potassium: 3.7 mmol/L (ref 3.5–5.1)
Sodium: 138 mmol/L (ref 135–145)
Total Bilirubin: 0.3 mg/dL (ref 0.0–1.2)
Total Protein: 6.7 g/dL (ref 6.5–8.1)

## 2023-04-04 LAB — MAGNESIUM: Magnesium: 2.2 mg/dL (ref 1.7–2.4)

## 2023-04-04 LAB — T4, FREE: Free T4: 0.73 ng/dL (ref 0.61–1.12)

## 2023-04-04 MED ORDER — PALONOSETRON HCL INJECTION 0.25 MG/5ML
0.2500 mg | Freq: Once | INTRAVENOUS | Status: AC
  Administered 2023-04-04: 0.25 mg via INTRAVENOUS
  Filled 2023-04-04: qty 5

## 2023-04-04 MED ORDER — DEXAMETHASONE SODIUM PHOSPHATE 10 MG/ML IJ SOLN
10.0000 mg | Freq: Once | INTRAMUSCULAR | Status: AC
Start: 2023-04-04 — End: 2023-04-04
  Administered 2023-04-04: 10 mg via INTRAVENOUS
  Filled 2023-04-04: qty 1

## 2023-04-04 MED ORDER — SODIUM CHLORIDE 0.9 % IV SOLN
200.0000 mg | Freq: Once | INTRAVENOUS | Status: AC
  Administered 2023-04-04: 200 mg via INTRAVENOUS
  Filled 2023-04-04: qty 200

## 2023-04-04 MED ORDER — SODIUM CHLORIDE 0.9 % IV SOLN
600.0000 mg/m2 | Freq: Once | INTRAVENOUS | Status: AC
  Administered 2023-04-04: 1000 mg via INTRAVENOUS
  Filled 2023-04-04: qty 50

## 2023-04-04 MED ORDER — SODIUM CHLORIDE 0.9 % IV SOLN
150.0000 mg | Freq: Once | INTRAVENOUS | Status: AC
  Administered 2023-04-04: 150 mg via INTRAVENOUS
  Filled 2023-04-04: qty 150

## 2023-04-04 MED ORDER — HEPARIN SOD (PORK) LOCK FLUSH 100 UNIT/ML IV SOLN
500.0000 [IU] | Freq: Once | INTRAVENOUS | Status: DC | PRN
  Filled 2023-04-04: qty 5

## 2023-04-04 MED ORDER — DOXORUBICIN HCL CHEMO IV INJECTION 2 MG/ML
60.0000 mg/m2 | Freq: Once | INTRAVENOUS | Status: AC
  Administered 2023-04-04: 96 mg via INTRAVENOUS
  Filled 2023-04-04: qty 48

## 2023-04-04 MED ORDER — SODIUM CHLORIDE 0.9 % IV SOLN
Freq: Once | INTRAVENOUS | Status: AC
  Filled 2023-04-04: qty 250

## 2023-04-04 NOTE — Patient Instructions (Signed)
[facility]  Discharge Instructions: Thank you for choosing [facility] to provide your oncology and hematology care.  If you have a lab appointment with the Cancer Center, please go directly to the Cancer Center and check in at the registration area.  Wear comfortable clothing and clothing appropriate for easy access to any Portacath or PICC line.   We strive to give you quality time with your provider. You may need to reschedule your appointment if you arrive late (15 or more minutes).  Arriving late affects you and other patients whose appointments are after yours.  Also, if you miss three or more appointments without notifying the office, you may be dismissed from the clinic at the provider's discretion.      For prescription refill requests, have your pharmacy contact our office and allow 72 hours for refills to be completed.    Today you received the following chemotherapy and/or immunotherapy agents Keytruda, Adriamycin, Cytoxan      To help prevent nausea and vomiting after your treatment, we encourage you to take your nausea medication as directed.  BELOW ARE SYMPTOMS THAT SHOULD BE REPORTED IMMEDIATELY: *FEVER GREATER THAN 100.4 F (38 C) OR HIGHER *CHILLS OR SWEATING *NAUSEA AND VOMITING THAT IS NOT CONTROLLED WITH YOUR NAUSEA MEDICATION *UNUSUAL SHORTNESS OF BREATH *UNUSUAL BRUISING OR BLEEDING *URINARY PROBLEMS (pain or burning when urinating, or frequent urination) *BOWEL PROBLEMS (unusual diarrhea, constipation, pain near the anus) TENDERNESS IN MOUTH AND THROAT WITH OR WITHOUT PRESENCE OF ULCERS (sore throat, sores in mouth, or a toothache) UNUSUAL RASH, SWELLING OR PAIN  UNUSUAL VAGINAL DISCHARGE OR ITCHING   Items with * indicate a potential emergency and should be followed up as soon as possible or go to the Emergency Department if any problems should occur.  Please show the CHEMOTHERAPY ALERT CARD  or IMMUNOTHERAPY ALERT CARD at check-in to the Emergency Department and triage nurse.  Should you have questions after your visit or need to cancel or reschedule your appointment, please contact [facility] - A DEPT OF [facility]  [phone] and follow the prompts.  Office hours are 8:00 a.m. to 4:30 p.m. Monday - Friday. Please note that voicemails left after 4:00 p.m. may not be returned until the following business day.  We are closed weekends and major holidays. You have access to a nurse at all times for urgent questions. Please call the main number to the clinic [phone] and follow the prompts.  For any non-urgent questions, you may also contact your provider using MyChart. We now offer e-Visits for anyone 9 and older to request care online for non-urgent symptoms. For details visit [email or web address].   Also download the MyChart app! Go to the app store, search "MyChart", open the app, select Penny Gomez, and log in with your MyChart username and password.

## 2023-04-04 NOTE — Progress Notes (Signed)
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  Images and pathology results were reviewed and discussed with patient.  MRI showed  6.7 cm of abnormal enhancement, predominantly in the lower-inner inferior left breast, with the ultrasound-guided biopsy marking clip centrally positioned within the enhancement. Suspicious for involvement by cancer, cT3 disease.  Patient desires mastectomy. Patient has no desire of preserving fertility.  S/p Carboplatin, taxol Keytruda Q3 weeks x 4 cycle --MRI showed positive treatment response.  Labs are reviewed and discussed with patient. Proceed with AC + Keytrua with D4 GCSF    Acute pulmonary embolism (HCC) Continue Eliquis 5mg  BID  BRCA1 gene mutation positive Once she finished treatment of breast cancer, recommend risk-reducing salpingo oophorectomy  pancreatic screening at age 60 (or 44 years younger than the earliest exocrine pancreatic cancer diagnosis in the family, whichever is earlier)  Family members are encouraged to consider genetic testing for this familial pathogenic variant.   Encounter for antineoplastic chemotherapy Chemotherapy plan as listed above.    No orders of the defined types were placed in this encounter.  Follow-up in 3 weeks.   All questions were answered. The patient knows to call the clinic with any problems, questions or concerns.  [name], MD, PhD [facility] Hematology Oncology 04/04/2023    HISTORY OF PRESENTING ILLNESS:  Penny Gomez 38 y.o. female presents to establish  care for left triple negative breast cancer I have reviewed her chart and materials related to her cancer extensively and collaborated history with the patient. Summary of oncologic history is as follows: Oncology History  Invasive carcinoma of breast (HCC)  07/24/2022 Mammogram   She noticed left breast mass for 1 month.  Bilateral diagnostic mammogram showed Suspicious palpable left breast mass 8 o'clock position. Cortically thickened left axillary lymph node   10/10/2022 Initial Diagnosis   Invasive carcinoma of breast (HCC)  10/02/2022  Left breast mass biopsy and left axillary lymph node biopsy.  Diagnosis 1. Breast, left, needle core biopsy, 8 o'clock 6 cmfn, heart clip - INVASIVE MAMMARY CARCINOMA, NO SPECIAL TYPE. - TUBULE FORMATION: SCORE 3 - NUCLEAR PLEOMORPHISM: SCORE 3 - MITOTIC COUNT: SCORE 3 - TOTAL SCORE: 9 - OVERALL GRADE: 3 - LYMPHOVASCULAR INVASION: NOT IDENTIFIED - CANCER LENGTH: 11 MM - CALCIFICATIONS: NOT IDENTIFIED - DUCTAL CARCINOMA IN SITU: PRESENT, HIGH-GRADE - ER-, PR- HER2 - [IHC 1+] 2. Lymph node, needle/core biopsy, left axillary, hydromark - LYMPH NODE WITH REACTIVE CHANGES; NEGATIVE FOR MALIGNANCY.  Menarche at age of 26 First live birth at age of 62 OCP use: no History of hysterectomy: no Menopausal status: premenopausal History of HRT use: no History of chest radiation: no Number of previous breast biopsies:  right breast biopsy 05/20/2019 negative for malignancy Strong family history of cancer mother breast cancer diagnosed in 72s, maternal aunts x 2 breast cancer, maternal cousins breast cancer x 2, maternal grandmother pancreatic cancer.    10/10/2022 Cancer Staging   Staging form: Breast, AJCC 8th Edition - Clinical stage from 10/10/2022: Stage IIIB (cT3, cN0, cM0, G3, ER-, PR-, HER2-) - Signed by [name],  MD on 10/18/2022 Stage prefix: Initial diagnosis Nuclear grade: G3 Histologic grading system: 3 grade system   10/17/2022 Imaging    Bilateral MRI breasts w wo contrast  1. In total, there is approximately 6.7 cm of abnormal enhancement, predominantly in the lower-inner inferior left breast, with the ultrasound-guided biopsy marking clip centrally positioned within the enhancement. There are scattered small enhancing masses in the lower inner and lower outer anterior left (included in the measurement above).   2.  No evidence of right breast malignancy.   3. The axillary lymph nodes are not included within the field of view of this MRI for adequate assessment of lymphadenopathy.    10/17/2022 Procedure   S/p medi port placement.    10/25/2022 - 10/25/2022 Chemotherapy   Patient is on Treatment Plan : BREAST ADJUVANT DOSE DENSE AC q14d / PACLitaxel q7d     10/25/2022 -  Chemotherapy   Patient is on Treatment Plan : BREAST Pembrolizumab (200) D1 + Carboplatin (5) D1 + Paclitaxel (80) D1,8,15 q21d X 4 cycles / Pembrolizumab (200) D1 + AC D1 q21d x 4 cycles      Genetic Testing   Single pathogenic variant in BRCA1 called c.190T>G identified on the [facility] Multi-Cancer+RNA panel. The remainder of testing was normal. The report date is 11/11/2022.  The Multi-Cancer + RNA Panel offered by [facility] includes sequencing and/or deletion/duplication analysis of the following 70 genes:  AIP*, ALK, APC*, ATM*, AXIN2*, BAP1*, BARD1*, BLM*, BMPR1A*, BRCA1*, BRCA2*, BRIP1*, CDC73*, CDH1*, CDK4, CDKN1B*, CDKN2A, CHEK2*, CTNNA1*, DICER1*, EPCAM, EGFR, FH*, FLCN*, GREM1, HOXB13, KIT, LZTR1, MAX*, MBD4, MEN1*, MET, MITF, MLH1*, MSH2*, MSH3*, MSH6*, MUTYH*, NF1*, NF2*, NTHL1*, PALB2*, PDGFRA, PMS2*, POLD1*, POLE*, POT1*, PRKAR1A*, PTCH1*, PTEN*, RAD51C*, RAD51D*, RB1*, RET, SDHA*, SDHAF2*, SDHB*, SDHC*, SDHD*, SMAD4*, SMARCA4*, SMARCB1*, SMARCE1*, STK11*, SUFU*, TMEM127*, TP53*, TSC1*, TSC2*, VHL*. RNA analysis is performed for * genes.   11/24/2022 - 11/25/2022 Hospital Admission   Admitted due to acute pulmonary embolism, lower extremity US negative  for DVT.  Patietn was started on heparin gtt and transitioned to Eliquis at discharge.    02/10/2023 Procedure   S/p port placement.    03/10/2023 Imaging   MRI bilateral breast w wo contrast  No abnormal enhancement in the left breast consistent with response to neoadjuvant chemotherapy.    Today she presents for evaluation prior to chemotherapy She tolerates chemotherapy.  On Eliquis 5mg  BID for PE. She tolerates well.  No fever chills. No concerns of her medi port.      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   Vascular port complication     SURGICAL HISTORY: Past Surgical History:  Procedure Laterality Date   BREAST BIOPSY Right 05/20/2019   Affirm Bx- X- clip, neg   BREAST BIOPSY Left 10/02/2022   [place] Bx, path pending   BREAST BIOPSY Left 10/02/2022   [place] Bx Node- path pending   BREAST BIOPSY Left 10/02/2022  [place] LT BREAST BX W LOC DEV 1ST LESION IMG BX SPEC US GUIDE 10/02/2022 [facility]   CESAREAN SECTION     LAPAROSCOPIC BILATERAL SALPINGECTOMY Bilateral 02/09/2022   Procedure: LAPAROSCOPIC BILATERAL SALPINGECTOMY;  Surgeon: Hildred Laser, MD;  Location: [facility];  Service: Gynecology;  Laterality: Bilateral;   PORTACATH PLACEMENT Right 10/17/2022   Procedure: INSERTION PORT-A-CATH;  Surgeon: Henrene Dodge, MD;  Location: [facility];  Service: General;  Laterality: Right;   PORTACATH PLACEMENT Left 02/10/2023   Procedure: INSERTION PORT-A-CATH;  Surgeon: Sung Amabile, DO;  Location: [facility];  Service: General;  Laterality:  Left;   TEE WITHOUT CARDIOVERSION N/A 01/02/2023   Procedure: TRANSESOPHAGEAL ECHOCARDIOGRAM (TEE);  Surgeon: Antonieta Iba, MD;  Location: [facility];  Service: Cardiovascular;  Laterality: N/A;   TUBAL LIGATION      SOCIAL HISTORY: Social History   Socioeconomic History   Marital status: Single    Spouse name: Not on file   Number of children: 2   Years of education: Not on file   Highest education level: High school graduate  Occupational History   Not on file  Tobacco Use   Smoking status: Never    Passive exposure: Never   Smokeless tobacco: Never  Vaping Use   Vaping status: Never Used  Substance and Sexual Activity   Alcohol use: Yes    Comment: occ   Drug use: No   Sexual activity: Yes    Birth control/protection: Surgical  Other Topics Concern   Not on file  Social History Narrative   Not on file   Social Drivers of Health   Financial Resource Strain: Low Risk  (01/20/2023)   Received from [facility]   Overall Financial Resource Strain (CARDIA)    Difficulty of Paying Living Expenses: Not hard at all  Food Insecurity: No Food Insecurity (01/20/2023)   Received from [facility]   Hunger Vital Sign    Worried About Running Out of Food in the Last Year: Never true    Ran Out of Food in the Last Year: Never true  Transportation Needs: No Transportation Needs (01/20/2023)   Received from [facility] - Transportation    In the past 12 months, has lack of transportation kept you from medical appointments or from getting medications?: No    Lack of Transportation (Non-Medical): No  Physical Activity: Not on file  Stress: No Stress Concern Present (10/10/2022)   [employer] [facility] [employer] - Occupational Stress Questionnaire    Feeling of Stress : Only a little  Social Connections: Not on file  Intimate Partner Violence: Not At Risk (12/31/2022)   Humiliation, Afraid, Rape, and Kick  questionnaire    Fear of Current or Ex-Partner: No    Emotionally Abused: No    Physically Abused: No    Sexually Abused: No    FAMILY HISTORY: Family History  Problem Relation Age of Onset   Breast cancer Mother 5       no GT   Breast cancer Maternal Aunt 33   Breast cancer Maternal Aunt    Lung cancer Maternal Uncle    Lung cancer Maternal Uncle    Bone cancer Paternal Aunt    Pancreatic cancer Maternal Grandmother 68   Lung cancer Maternal Grandfather 3   Breast cancer Cousin 40   Breast cancer Other 33       gene pos?    ALLERGIES:  has no known allergies.  MEDICATIONS:  Current Outpatient Medications  Medication Sig Dispense Refill   acetaminophen (TYLENOL) 500 MG tablet Take 2 tablets (1,000 mg total) by mouth every 6 (six) hours as needed for mild pain.     apixaban (ELIQUIS) 5 MG TABS tablet Take 1 tablet (5 mg total) by mouth 2 (two) times daily. Start AFTER completing your starter pack 60 tablet 1   HYDROcodone-acetaminophen (NORCO) 5-325 MG tablet Take 1 tablet by mouth every 6 (six) hours as needed for up to 6 doses for moderate pain (pain score 4-6). 6 tablet 0   lidocaine-prilocaine (EMLA) cream Apply 1 Application topically as needed. 30 g 11   senna (SENOKOT) 8.6 MG TABS tablet Take 2 tablets (17.2 mg total) by mouth daily. 120 tablet 2   simethicone (MYLICON) 80 MG chewable tablet Chew 1 tablet (80 mg total) by mouth every 6 (six) hours as needed for flatulence. 30 tablet 2   No current facility-administered medications for this visit.   Facility-Administered Medications Ordered in Other Visits  Medication Dose Route Frequency Provider Last Rate Last Admin   heparin lock flush 100 unit/mL  500 Units Intracatheter Once PRN [name], MD        Review of Systems  Constitutional:  Negative for appetite change, chills, fatigue and fever.  HENT:   Negative for hearing loss and voice change.   Eyes:  Negative for eye problems.  Respiratory:  Negative for  chest tightness and cough.   Cardiovascular:  Negative for chest pain.  Gastrointestinal:  Negative for abdominal distention, abdominal pain and blood in stool.  Endocrine: Negative for hot flashes.  Genitourinary:  Positive for menstrual problem. Negative for difficulty urinating and frequency.   Musculoskeletal:  Negative for arthralgias.  Skin:  Negative for itching and rash.  Neurological:  Negative for extremity weakness and headaches.  Hematological:  Negative for adenopathy.  Psychiatric/Behavioral:  Negative for confusion.      PHYSICAL EXAMINATION: ECOG PERFORMANCE STATUS: 0 - Asymptomatic  Vitals:   04/04/23 0855  BP: 118/67  Pulse: 71  Resp: 18  Temp: (!) 97.5 F (36.4 C)   Filed Weights   04/04/23 0855  Weight: 150 lb 14.4 oz (68.4 kg)    Physical Exam Constitutional:      General: She is not in acute distress.    Appearance: She is not diaphoretic.  HENT:     Head: Normocephalic and atraumatic.  Eyes:     General: No scleral icterus. Cardiovascular:     Rate and Rhythm: Normal rate and regular rhythm.  Pulmonary:     Effort: Pulmonary effort is normal. No respiratory distress.     Breath sounds: No wheezing.  Abdominal:     General: Bowel sounds are normal. There is no distension.     Palpations: Abdomen is soft.  Musculoskeletal:        General: Normal range of motion.     Cervical back: Normal range of motion and neck supple.  Skin:    General: Skin is warm and dry.     Findings: No erythema.     Comments: Left anterior chest wall medi port  Neurological:     Mental Status: She is alert and oriented to person, place, and time. Mental status is at baseline.     Motor: No abnormal muscle tone.  Psychiatric:        Mood and Affect: Mood and affect normal.     LABORATORY DATA:  I have reviewed the data as listed  Latest Ref Rng & Units 04/04/2023    8:30 AM 03/14/2023    8:33 AM 03/07/2023    8:13 AM  CBC  WBC 4.0 - 10.5 K/uL 6.1  12.1   5.2   Hemoglobin 12.0 - 15.0 g/dL 30.8  65.7  84.6   Hematocrit 36.0 - 46.0 % 34.4  35.6  34.0   Platelets 150 - 400 K/uL 201  134  174       Latest Ref Rng & Units 04/04/2023    8:30 AM 03/14/2023    8:33 AM 03/07/2023    8:13 AM  CMP  Glucose 70 - 99 mg/dL 962  952  841   BUN 6 - 20 mg/dL 12  8  12    Creatinine 0.44 - 1.00 mg/dL 3.24  4.01  0.27   Sodium 135 - 145 mmol/L 138  139  137   Potassium 3.5 - 5.1 mmol/L 3.7  3.7  3.7   Chloride 98 - 111 mmol/L 106  108  105   CO2 22 - 32 mmol/L 24  25  24    Calcium 8.9 - 10.3 mg/dL 8.9  8.9  9.0   Total Protein 6.5 - 8.1 g/dL 6.7  7.2  6.9   Total Bilirubin 0.0 - 1.2 mg/dL 0.3  0.5  0.5   Alkaline Phos 38 - 126 U/L 84  90  64   AST 15 - 41 U/L 27  25  22    ALT 0 - 44 U/L 32  32  31      RADIOGRAPHIC STUDIES: I have personally reviewed the radiological images as listed and agreed with the findings in the report. ECHOCARDIOGRAM COMPLETE Result Date: 03/21/2023    ECHOCARDIOGRAM REPORT   Patient Name:   MALANA EBERWEIN Date of Exam: 03/21/2023 Medical Rec #:  [id number]       Height:       60.0 in Accession #:    [id number]      Weight:       149.6 lb Date of Birth:  1985-03-27        BSA:          1.650 m Patient Age:    38 years        BP:           130/77 mmHg Patient Gender: F               HR:           79 bpm. Exam Location:  [facility] Procedure: 2D Echo, Cardiac Doppler, Color Doppler and Strain Analysis Indications:     Chemo Z09  History:         Patient has prior history of Echocardiogram examinations, most                  recent 10/17/2022.  Sonographer:     Cristela Blue Referring Phys:  [id number] Jermie Hippe Diagnosing Phys: Debbe Odea MD  Sonographer Comments: Global longitudinal strain was attempted. IMPRESSIONS  1. Left ventricular ejection fraction, by estimation, is 60 to 65%. The left ventricle has normal function. The left ventricle has no regional wall motion abnormalities. Left ventricular diastolic parameters were normal. The average  left ventricular global longitudinal strain is -18.0 %. The global longitudinal strain is normal.  2. Right ventricular systolic function is normal. The right ventricular size is normal.  3. The mitral valve is normal in structure. Mild mitral valve regurgitation.  4. The aortic valve is tricuspid.  Aortic valve regurgitation is not visualized. FINDINGS  Left Ventricle: Left ventricular ejection fraction, by estimation, is 60 to 65%. The left ventricle has normal function. The left ventricle has no regional wall motion abnormalities. The average left ventricular global longitudinal strain is -18.0 %. The global longitudinal strain is normal. The left ventricular internal cavity size was normal in size. There is no left ventricular hypertrophy. Left ventricular diastolic parameters were normal. Right Ventricle: The right ventricular size is normal. No increase in right ventricular wall thickness. Right ventricular systolic function is normal. Left Atrium: Left atrial size was normal in size. Right Atrium: Right atrial size was normal in size. Pericardium: There is no evidence of pericardial effusion. Mitral Valve: The mitral valve is normal in structure. Mild mitral valve regurgitation. Tricuspid Valve: The tricuspid valve is normal in structure. Tricuspid valve regurgitation is not demonstrated. Aortic Valve: The aortic valve is tricuspid. Aortic valve regurgitation is not visualized. Aortic valve mean gradient measures 3.0 mmHg. Aortic valve peak gradient measures 5.0 mmHg. Aortic valve area, by VTI measures 2.39 cm. Pulmonic Valve: The pulmonic valve was normal in structure. Pulmonic valve regurgitation is not visualized. Aorta: The aortic root is normal in size and structure. Venous: The inferior vena cava was not well visualized. IAS/Shunts: No atrial level shunt detected by color flow Doppler.  LEFT VENTRICLE PLAX 2D LVIDd:         2.60 cm   Diastology LVIDs:         1.50 cm   LV e' medial:    8.59 cm/s LV PW:          1.00 cm   LV E/e' medial:  8.0 LV IVS:        1.00 cm   LV e' lateral:   12.90 cm/s LVOT diam:     2.00 cm   LV E/e' lateral: 5.3 LV SV:         50 LV SV Index:   30        2D Longitudinal Strain LVOT Area:     3.14 cm  2D Strain GLS Avg:     -18.0 %  RIGHT VENTRICLE RV Basal diam:  4.30 cm RV Mid diam:    3.60 cm LEFT ATRIUM             Index        RIGHT ATRIUM           Index LA diam:        2.30 cm 1.39 cm/m   RA Area:     14.80 cm LA Vol (A2C):   35.7 ml 21.64 ml/m  RA Volume:   42.40 ml  25.70 ml/m LA Vol (A4C):   22.0 ml 13.33 ml/m LA Biplane Vol: 28.6 ml 17.33 ml/m  AORTIC VALVE AV Area (Vmax):    2.31 cm AV Area (Vmean):   2.30 cm AV Area (VTI):     2.39 cm AV Vmax:           112.00 cm/s AV Vmean:          80.400 cm/s AV VTI:            0.208 m AV Peak Grad:      5.0 mmHg AV Mean Grad:      3.0 mmHg LVOT Vmax:         82.20 cm/s LVOT Vmean:        58.800 cm/s LVOT VTI:  0.158 m LVOT/AV VTI ratio: 0.76  AORTA Ao Root diam: 2.70 cm MITRAL VALVE               TRICUSPID VALVE MV Area (PHT): 3.53 cm    TR Peak grad:   7.8 mmHg MV Decel Time: 215 msec    TR Vmax:        140.00 cm/s MV E velocity: 68.60 cm/s MV A velocity: 53.60 cm/s  SHUNTS MV E/A ratio:  1.28        Systemic VTI:  0.16 m                            Systemic Diam: 2.00 cm Debbe Odea MD Electronically signed by Debbe Odea MD Signature Date/Time: 03/21/2023/11:58:22 AM    Final    MR BREAST BILATERAL W WO CONTRAST INC CAD Result Date: 03/10/2023 CLINICAL DATA:  38 year old female with biopsy proven triple negative left breast cancer. Assess response to neoadjuvant chemotherapy. Patient desires mastectomy. Previous MRI dated 10/17/2022 demonstrated 6.7 cm of abnormal enhancement in the lower-inner quadrant of the left breast. EXAM: BILATERAL BREAST MRI WITH AND WITHOUT CONTRAST TECHNIQUE: Multiplanar, multisequence MR images of both breasts were obtained prior to and following the intravenous administration of  6 ml of Gadavist Three-dimensional MR images were rendered by post-processing of the original MR data on an independent workstation. The three-dimensional MR images were interpreted, and findings are reported in the following complete MRI report for this study. Three dimensional images were evaluated at the independent interpreting workstation using the DynaCAD thin client. COMPARISON:  Previous exam(s) including MRI dated 10/17/2022. FINDINGS: Breast composition: c. Heterogeneous fibroglandular tissue. Background parenchymal enhancement: Mild Right breast: No mass or abnormal enhancement. Left breast: There is a signal void artifact in the lower-inner quadrant of the left breast from the biopsy clip. There is no mass or abnormal enhancement. Previously seen enhancement spanning 6.7 cm has resolved. Lymph nodes: No abnormal appearing lymph nodes. Ancillary findings:  None. IMPRESSION: No abnormal enhancement in the left breast consistent with response to neoadjuvant chemotherapy. RECOMMENDATION: Continued treatment planning of the known triple negative left breast cancer is recommended. BI-RADS CATEGORY  6: Known biopsy-proven malignancy. Electronically Signed   By: Baird Lyons M.D.   On: 03/10/2023 10:37

## 2023-04-04 NOTE — Assessment & Plan Note (Signed)
Once she finished treatment of breast cancer, recommend risk-reducing salpingo oophorectomy  pancreatic screening at age 38 (or 71 years younger than the earliest exocrine pancreatic cancer diagnosis in the family, whichever is earlier)  Family members are encouraged to consider genetic testing for this familial pathogenic variant.

## 2023-04-04 NOTE — ED Triage Notes (Signed)
Pt reports she felt her heart racing while she was at home. Pt is a cancer pt being treat for breast cancer and had chemo today. Pt is diaphoretic. Pt talks in complete sentences no respiratory distress noted.

## 2023-04-04 NOTE — ED Provider Notes (Signed)
[facility] Provider Note    Event Date/Time   First MD Initiated Contact with Patient 04/04/23 2048     (approximate)   History   Tachycardia   HPI  Penny Gomez is a 38 year old female with history of breast cancer on chemotherapy presenting to the emergency department for evaluation of palpitations.  Patient completed her chemotherapy session today, scheduled for every 3 weeks.  It was her second time receiving this.  Later in the day she had onset of palpitations.  Lasted for about 10 minutes.  Has since resolved without recurrence.  Denies chest pain during this time.  No syncope.  No history of similar.  Denies fevers, chills, cough, diarrhea, abdominal pain.  Does report nausea which she has previously had with chemotherapy.     Physical Exam   Triage Vital Signs: ED Triage Vitals [04/04/23 2042]  Encounter Vitals Group     BP 113/69     Systolic BP Percentile      Diastolic BP Percentile      Pulse Rate 73     Resp 16     Temp 97.6 F (36.4 C)     Temp Source Oral     SpO2 100 %     Weight 150 lb (68 kg)     Height 5' (1.524 m)     Head Circumference      Peak Flow      Pain Score 4     Pain Loc      Pain Education      Exclude from Growth Chart     Most recent vital signs: Vitals:   04/04/23 2042 04/04/23 2230  BP: 113/69 112/69  Pulse: 73 (!) 48  Resp: 16 14  Temp: 97.6 F (36.4 C)   SpO2: 100% 96%     General: Awake, interactive  CV:  Regular rate, good peripheral perfusion.  Resp:  Unlabored respirations, lungs clear to auscultation Abd:  Nondistended, soft, nontender to palpation Neuro:  Symmetric facial movement, fluid speech   ED Results / Procedures / Treatments   Labs (all labs ordered are listed, but only abnormal results are displayed) Labs Reviewed  CBC WITH DIFFERENTIAL/PLATELET - Abnormal; Notable for the following components:      Result Value   RBC 3.62 (*)    Hemoglobin 11.1 (*)    HCT 34.6  (*)    RDW 16.0 (*)    Neutro Abs 9.4 (*)    All other components within normal limits  COMPREHENSIVE METABOLIC PANEL - Abnormal; Notable for the following components:   CO2 21 (*)    Glucose, Bld 135 (*)    All other components within normal limits  MAGNESIUM  TSH  T4, FREE  TROPONIN I (HIGH SENSITIVITY)  TROPONIN I (HIGH SENSITIVITY)     EKG EKG independently reviewed interpreted by myself (ER attending) demonstrates:  EKG demonstrates normal sinus rhythm rate of 73, PR 142, QRS 90, QTc 449, no acute ST changes  RADIOLOGY Imaging independently reviewed and interpreted by myself demonstrates:    PROCEDURES:  Critical Care performed: No  Procedures   MEDICATIONS ORDERED IN ED: Medications - No data to display   IMPRESSION / MDM / ASSESSMENT AND PLAN / ED COURSE  I reviewed the triage vital signs and the nursing notes.  Differential diagnosis includes, but is not limited to, anemia, electrolyte abnormality, arrhythmia, thyroid dysfunction, medication adverse effect  Patient's presentation is most consistent with acute presentation with potential  threat to life or bodily function.  38 year old female presenting with palpitations.  History of cancer on chemotherapy, but denies fever or infectious symptoms.  Will obtain labs, EKG, x-Jozey Janco to further evaluate.  Initial labs overall reassuring including normal white blood cell count, stable anemia.  Electrolytes without critical derangements.  Normal thyroid function.  Initial negative troponin.  With onset of symptoms shortly prior to presentation, will plan for repeat troponin.  CXR also pending.  Signed out to me physician at 2300.      FINAL CLINICAL IMPRESSION(S) / ED DIAGNOSES   Final diagnoses:  Palpitations     Rx / DC Orders   ED Discharge Orders     None        Note:  This document was prepared using Dragon voice recognition software and may include unintentional dictation errors.   Trinna Post,  MD 04/04/23 [phone]

## 2023-04-04 NOTE — Progress Notes (Signed)
Patient here for follow up. No new concerns voiced.  °

## 2023-04-04 NOTE — Assessment & Plan Note (Signed)
 Chemotherapy plan as listed above

## 2023-04-04 NOTE — Assessment & Plan Note (Signed)
 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  Images and pathology results were reviewed and discussed with patient.  MRI showed  6.7 cm of abnormal enhancement, predominantly in the lower-inner inferior left breast, with the ultrasound-guided biopsy marking clip centrally positioned within the enhancement. Suspicious for involvement by cancer, cT3 disease.  Patient desires mastectomy. Patient has no desire of preserving fertility.  S/p Carboplatin, taxol Keytruda Q3 weeks x 4 cycle --MRI showed positive treatment response.  Labs are reviewed and discussed with patient. Proceed with AC + Keytrua with D4 GCSF

## 2023-04-04 NOTE — Assessment & Plan Note (Signed)
 Continue Eliquis 5mg  BID

## 2023-04-05 LAB — TROPONIN I (HIGH SENSITIVITY): Troponin I (High Sensitivity): 4 ng/L (ref <18)

## 2023-04-05 NOTE — Discharge Instructions (Addendum)
 Fortunately your evaluation the emergency department did not show any signs of emergency life-threatening conditions.  Talk to your regular doctor for follow-up appointment to discuss Holter monitor which can be worn for an extended period of time to check for heart rhythm problems.  Thank you for choosing [place] for your health care today!  Please see your primary doctor this week for a follow up appointment.   If you have any new, worsening, or unexpected symptoms call your doctor right away or come back to the emergency department for reevaluation.  It was my pleasure to care for you today.

## 2023-04-06 ENCOUNTER — Other Ambulatory Visit: Payer: Self-pay

## 2023-04-07 ENCOUNTER — Inpatient Hospital Stay: Payer: Medicaid Other

## 2023-04-07 DIAGNOSIS — Z5112 Encounter for antineoplastic immunotherapy: Secondary | ICD-10-CM | POA: Diagnosis not present

## 2023-04-07 DIAGNOSIS — C50919 Malignant neoplasm of unspecified site of unspecified female breast: Secondary | ICD-10-CM

## 2023-04-07 MED ORDER — PEGFILGRASTIM-CBQV 6 MG/0.6ML ~~LOC~~ SOSY
6.0000 mg | PREFILLED_SYRINGE | Freq: Once | SUBCUTANEOUS | Status: AC
  Administered 2023-04-07: 6 mg via SUBCUTANEOUS
  Filled 2023-04-07: qty 0.6

## 2023-04-17 ENCOUNTER — Encounter: Payer: Self-pay | Admitting: Oncology

## 2023-04-22 ENCOUNTER — Telehealth: Payer: Self-pay

## 2023-04-22 ENCOUNTER — Encounter: Payer: Self-pay | Admitting: Oncology

## 2023-04-22 NOTE — Telephone Encounter (Signed)
 Patient requesting continuous FMLA.  Will confirm with Dr. Cathie Hoops if OK to approve and if so how long.

## 2023-04-22 NOTE — Telephone Encounter (Signed)
 Completed form faxed and copy sent for scan to chart

## 2023-04-22 NOTE — Telephone Encounter (Signed)
 FMLA pending MD signature.

## 2023-04-22 NOTE — Telephone Encounter (Signed)
 Dr. Cathie Hoops ok with continuous FMLA for 4 months

## 2023-04-24 MED FILL — Fosaprepitant Dimeglumine For IV Infusion 150 MG (Base Eq): INTRAVENOUS | Qty: 5 | Status: AC

## 2023-04-25 ENCOUNTER — Inpatient Hospital Stay: Payer: Medicaid Other

## 2023-04-25 ENCOUNTER — Encounter: Payer: Self-pay | Admitting: Oncology

## 2023-04-25 ENCOUNTER — Inpatient Hospital Stay (HOSPITAL_BASED_OUTPATIENT_CLINIC_OR_DEPARTMENT_OTHER): Payer: Medicaid Other | Admitting: Oncology

## 2023-04-25 ENCOUNTER — Inpatient Hospital Stay: Payer: Medicaid Other | Attending: Oncology

## 2023-04-25 VITALS — BP 108/69 | HR 65 | Temp 96.0°F | Resp 18 | Wt 152.3 lb

## 2023-04-25 DIAGNOSIS — Z5111 Encounter for antineoplastic chemotherapy: Secondary | ICD-10-CM

## 2023-04-25 DIAGNOSIS — C50919 Malignant neoplasm of unspecified site of unspecified female breast: Secondary | ICD-10-CM

## 2023-04-25 DIAGNOSIS — Z7901 Long term (current) use of anticoagulants: Secondary | ICD-10-CM | POA: Insufficient documentation

## 2023-04-25 DIAGNOSIS — Z86711 Personal history of pulmonary embolism: Secondary | ICD-10-CM | POA: Insufficient documentation

## 2023-04-25 DIAGNOSIS — Z1732 Human epidermal growth factor receptor 2 negative status: Secondary | ICD-10-CM | POA: Diagnosis not present

## 2023-04-25 DIAGNOSIS — Z1509 Genetic susceptibility to other malignant neoplasm: Secondary | ICD-10-CM

## 2023-04-25 DIAGNOSIS — D509 Iron deficiency anemia, unspecified: Secondary | ICD-10-CM | POA: Insufficient documentation

## 2023-04-25 DIAGNOSIS — R002 Palpitations: Secondary | ICD-10-CM | POA: Diagnosis not present

## 2023-04-25 DIAGNOSIS — C50312 Malignant neoplasm of lower-inner quadrant of left female breast: Secondary | ICD-10-CM | POA: Diagnosis present

## 2023-04-25 DIAGNOSIS — Z1501 Genetic susceptibility to malignant neoplasm of breast: Secondary | ICD-10-CM | POA: Diagnosis not present

## 2023-04-25 DIAGNOSIS — Z5112 Encounter for antineoplastic immunotherapy: Secondary | ICD-10-CM | POA: Insufficient documentation

## 2023-04-25 DIAGNOSIS — D500 Iron deficiency anemia secondary to blood loss (chronic): Secondary | ICD-10-CM

## 2023-04-25 DIAGNOSIS — I2699 Other pulmonary embolism without acute cor pulmonale: Secondary | ICD-10-CM

## 2023-04-25 DIAGNOSIS — Z1722 Progesterone receptor negative status: Secondary | ICD-10-CM | POA: Insufficient documentation

## 2023-04-25 DIAGNOSIS — Z171 Estrogen receptor negative status [ER-]: Secondary | ICD-10-CM | POA: Diagnosis not present

## 2023-04-25 DIAGNOSIS — Z5189 Encounter for other specified aftercare: Secondary | ICD-10-CM | POA: Diagnosis not present

## 2023-04-25 LAB — CBC WITH DIFFERENTIAL (CANCER CENTER ONLY)
Abs Immature Granulocytes: 0.03 10*3/uL (ref 0.00–0.07)
Basophils Absolute: 0.1 10*3/uL (ref 0.0–0.1)
Basophils Relative: 1 %
Eosinophils Absolute: 0 10*3/uL (ref 0.0–0.5)
Eosinophils Relative: 1 %
HCT: 36.3 % (ref 36.0–46.0)
Hemoglobin: 11.8 g/dL — ABNORMAL LOW (ref 12.0–15.0)
Immature Granulocytes: 1 %
Lymphocytes Relative: 26 %
Lymphs Abs: 1.5 10*3/uL (ref 0.7–4.0)
MCH: 30.4 pg (ref 26.0–34.0)
MCHC: 32.5 g/dL (ref 30.0–36.0)
MCV: 93.6 fL (ref 80.0–100.0)
Monocytes Absolute: 0.5 10*3/uL (ref 0.1–1.0)
Monocytes Relative: 8 %
Neutro Abs: 3.5 10*3/uL (ref 1.7–7.7)
Neutrophils Relative %: 63 %
Platelet Count: 160 10*3/uL (ref 150–400)
RBC: 3.88 MIL/uL (ref 3.87–5.11)
RDW: 16.3 % — ABNORMAL HIGH (ref 11.5–15.5)
WBC Count: 5.6 10*3/uL (ref 4.0–10.5)
nRBC: 0 % (ref 0.0–0.2)

## 2023-04-25 LAB — CMP (CANCER CENTER ONLY)
ALT: 32 U/L (ref 0–44)
AST: 27 U/L (ref 15–41)
Albumin: 4.1 g/dL (ref 3.5–5.0)
Alkaline Phosphatase: 81 U/L (ref 38–126)
Anion gap: 8 (ref 5–15)
BUN: 18 mg/dL (ref 6–20)
CO2: 23 mmol/L (ref 22–32)
Calcium: 8.9 mg/dL (ref 8.9–10.3)
Chloride: 105 mmol/L (ref 98–111)
Creatinine: 0.64 mg/dL (ref 0.44–1.00)
GFR, Estimated: >60 mL/min (ref >60)
Glucose, Bld: 123 mg/dL — ABNORMAL HIGH (ref 70–99)
Potassium: 3.7 mmol/L (ref 3.5–5.1)
Sodium: 136 mmol/L (ref 135–145)
Total Bilirubin: 0.4 mg/dL (ref 0.0–1.2)
Total Protein: 7.2 g/dL (ref 6.5–8.1)

## 2023-04-25 MED ORDER — DEXAMETHASONE SODIUM PHOSPHATE 10 MG/ML IJ SOLN
10.0000 mg | Freq: Once | INTRAMUSCULAR | Status: AC
  Administered 2023-04-25: 10 mg via INTRAVENOUS
  Filled 2023-04-25: qty 1

## 2023-04-25 MED ORDER — SODIUM CHLORIDE 0.9 % IV SOLN
Freq: Once | INTRAVENOUS | Status: AC
  Filled 2023-04-25: qty 250

## 2023-04-25 MED ORDER — SODIUM CHLORIDE 0.9 % IV SOLN
600.0000 mg/m2 | Freq: Once | INTRAVENOUS | Status: AC
  Administered 2023-04-25: 1000 mg via INTRAVENOUS
  Filled 2023-04-25: qty 50

## 2023-04-25 MED ORDER — SODIUM CHLORIDE 0.9 % IV SOLN
200.0000 mg | Freq: Once | INTRAVENOUS | Status: AC
  Administered 2023-04-25: 200 mg via INTRAVENOUS
  Filled 2023-04-25: qty 200

## 2023-04-25 MED ORDER — SODIUM CHLORIDE 0.9 % IV SOLN
150.0000 mg | Freq: Once | INTRAVENOUS | Status: AC
  Administered 2023-04-25: 150 mg via INTRAVENOUS
  Filled 2023-04-25: qty 150

## 2023-04-25 MED ORDER — HEPARIN SOD (PORK) LOCK FLUSH 100 UNIT/ML IV SOLN
500.0000 [IU] | Freq: Once | INTRAVENOUS | Status: AC | PRN
  Administered 2023-04-25: 500 [IU]
  Filled 2023-04-25: qty 5

## 2023-04-25 MED ORDER — DOXORUBICIN HCL CHEMO IV INJECTION 2 MG/ML
60.0000 mg/m2 | Freq: Once | INTRAVENOUS | Status: AC
Start: 2023-04-25 — End: 2023-04-25
  Administered 2023-04-25: 96 mg via INTRAVENOUS
  Filled 2023-04-25: qty 48

## 2023-04-25 MED ORDER — PALONOSETRON HCL INJECTION 0.25 MG/5ML
0.2500 mg | Freq: Once | INTRAVENOUS | Status: AC
  Administered 2023-04-25: 0.25 mg via INTRAVENOUS
  Filled 2023-04-25: qty 5

## 2023-04-25 NOTE — Assessment & Plan Note (Signed)
 Patient is on adriamycin. Recent Echo showed normal LVEF. Regular rhythm on physical examination.   Normal TSH Refer to cardiology.

## 2023-04-25 NOTE — Assessment & Plan Note (Signed)
 Chemotherapy plan as listed above

## 2023-04-25 NOTE — Assessment & Plan Note (Signed)
Once she finished treatment of breast cancer, recommend risk-reducing salpingo oophorectomy  pancreatic screening at age 38 (or 71 years younger than the earliest exocrine pancreatic cancer diagnosis in the family, whichever is earlier)  Family members are encouraged to consider genetic testing for this familial pathogenic variant.

## 2023-04-25 NOTE — Assessment & Plan Note (Addendum)
 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  Images and pathology results were reviewed and discussed with patient.  MRI showed  6.7 cm of abnormal enhancement, predominantly in the lower-inner inferior left breast, with the ultrasound-guided biopsy marking clip centrally positioned within the enhancement. Suspicious for involvement by cancer, cT3 disease.  Patient desires mastectomy. Patient has no desire of preserving fertility.  S/p Carboplatin, taxol Keytruda Q3 weeks x 4 cycle --MRI showed positive treatment response.  Labs are reviewed and discussed with patient. Proceed with cycle 3 AC + Keytrua with D4 GCSF

## 2023-04-25 NOTE — Progress Notes (Signed)
 Hematology/Oncology Progress note [phone] [phone]       CHIEF COMPLAINTS/PURPOSE OF CONSULTATION:  Left triple negative breast cancer.   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  Images and pathology results were reviewed and discussed with patient.  MRI showed  6.7 cm of abnormal enhancement, predominantly in the lower-inner inferior left breast, with the ultrasound-guided biopsy marking clip centrally positioned within the enhancement. Suspicious for involvement by cancer, cT3 disease.  Patient desires mastectomy. Patient has no desire of preserving fertility.  S/p Carboplatin, taxol Keytruda Q3 weeks x 4 cycle --MRI showed positive treatment response.  Labs are reviewed and discussed with patient. Proceed with cycle 3 AC + Keytrua with D4 GCSF    Acute pulmonary embolism (HCC) Continue Eliquis 5mg  BID  BRCA1 gene mutation positive Once she finished treatment of breast cancer, recommend risk-reducing salpingo oophorectomy  pancreatic screening at age 14 (or 67 years younger than the earliest exocrine pancreatic cancer diagnosis in the family, whichever is earlier)  Family members are encouraged to consider genetic testing for this familial pathogenic variant.   Encounter for antineoplastic chemotherapy Chemotherapy plan as listed above.   IDA (iron deficiency anemia) Labs are reviewed and discussed with patient. Lab Results  Component Value Date   HGB 11.1 (L) 04/04/2023   TIBC 377 02/28/2023   IRONPCTSAT 11 02/28/2023   FERRITIN 23 02/28/2023   Hemoglobin is stable. IV venofer as needed.   Palpitation Patient is on adriamycin. Recent Echo showed normal LVEF. Regular  rhythm on physical examination.   Normal TSH Refer to cardiology.    Orders Placed This Encounter  Procedures   Ambulatory referral to Cardiology    Referral Priority:   Urgent    Referral Type:   Consultation    Referral Reason:   Specialty Services Required    Number of Visits Requested:   1   Follow-up in 3 weeks.   All questions were answered. The patient knows to call the clinic with any problems, questions or concerns.  [name], MD, PhD [facility] Hematology Oncology 04/25/2023    HISTORY OF PRESENTING ILLNESS:  Penny Gomez 38 y.o. female presents to establish care for left triple negative breast cancer I have reviewed her chart and materials related to her cancer extensively and collaborated history with the patient. Summary of oncologic history is as follows: Oncology History  Invasive carcinoma of breast (HCC)  07/24/2022 Mammogram   She noticed left breast mass for 1 month.  Bilateral diagnostic mammogram showed Suspicious palpable left breast mass 8 o'clock position. Cortically thickened left axillary lymph node   10/10/2022 Initial Diagnosis   Invasive carcinoma of breast (HCC)  10/02/2022  Left breast mass biopsy and left axillary lymph node biopsy.  Diagnosis 1. Breast, left, needle core biopsy, 8 o'clock 6 cmfn, heart clip - INVASIVE MAMMARY CARCINOMA, NO SPECIAL TYPE. - TUBULE FORMATION: SCORE 3 - NUCLEAR PLEOMORPHISM: SCORE 3 - MITOTIC COUNT: SCORE 3 - TOTAL SCORE: 9 - OVERALL GRADE: 3 - LYMPHOVASCULAR INVASION: NOT IDENTIFIED - CANCER LENGTH: 11 MM - CALCIFICATIONS: NOT IDENTIFIED - DUCTAL CARCINOMA IN SITU: PRESENT, HIGH-GRADE - ER-, PR- HER2 - [IHC 1+] 2. Lymph node, needle/core biopsy, left axillary, hydromark - LYMPH  NODE WITH REACTIVE CHANGES; NEGATIVE FOR MALIGNANCY.  Menarche at age of 50 First live birth at age of 9 OCP use: no History of hysterectomy: no Menopausal status: premenopausal History of HRT use: no History of chest  radiation: no Number of previous breast biopsies:  right breast biopsy 05/20/2019 negative for malignancy Strong family history of cancer mother breast cancer diagnosed in 51s, maternal aunts x 2 breast cancer, maternal cousins breast cancer x 2, maternal grandmother pancreatic cancer.    10/10/2022 Cancer Staging   Staging form: Breast, AJCC 8th Edition - Clinical stage from 10/10/2022: Stage IIIB (cT3, cN0, cM0, G3, ER-, PR-, HER2-) - Signed by [name], MD on 10/18/2022 Stage prefix: Initial diagnosis Nuclear grade: G3 Histologic grading system: 3 grade system   10/17/2022 Imaging   Bilateral MRI breasts w wo contrast  1. In total, there is approximately 6.7 cm of abnormal enhancement, predominantly in the lower-inner inferior left breast, with the ultrasound-guided biopsy marking clip centrally positioned within the enhancement. There are scattered small enhancing masses in the lower inner and lower outer anterior left (included in the measurement above).   2.  No evidence of right breast malignancy.   3. The axillary lymph nodes are not included within the field of view of this MRI for adequate assessment of lymphadenopathy.    10/17/2022 Procedure   S/p medi port placement.    10/25/2022 - 10/25/2022 Chemotherapy   Patient is on Treatment Plan : BREAST ADJUVANT DOSE DENSE AC q14d / PACLitaxel q7d     10/25/2022 -  Chemotherapy   Patient is on Treatment Plan : BREAST Pembrolizumab (200) D1 + Carboplatin (5) D1 + Paclitaxel (80) D1,8,15 q21d X 4 cycles / Pembrolizumab (200) D1 + AC D1 q21d x 4 cycles      Genetic Testing   Single pathogenic variant in BRCA1 called c.190T>G identified on the [facility] Multi-Cancer+RNA panel. The remainder of testing was normal. The report date is 11/11/2022.  The Multi-Cancer + RNA Panel offered by [facility] includes sequencing and/or deletion/duplication analysis of the following 70 genes:  AIP*, ALK, APC*, ATM*, AXIN2*, BAP1*, BARD1*, BLM*, BMPR1A*, BRCA1*,  BRCA2*, BRIP1*, CDC73*, CDH1*, CDK4, CDKN1B*, CDKN2A, CHEK2*, CTNNA1*, DICER1*, EPCAM, EGFR, FH*, FLCN*, GREM1, HOXB13, KIT, LZTR1, MAX*, MBD4, MEN1*, MET, MITF, MLH1*, MSH2*, MSH3*, MSH6*, MUTYH*, NF1*, NF2*, NTHL1*, PALB2*, PDGFRA, PMS2*, POLD1*, POLE*, POT1*, PRKAR1A*, PTCH1*, PTEN*, RAD51C*, RAD51D*, RB1*, RET, SDHA*, SDHAF2*, SDHB*, SDHC*, SDHD*, SMAD4*, SMARCA4*, SMARCB1*, SMARCE1*, STK11*, SUFU*, TMEM127*, TP53*, TSC1*, TSC2*, VHL*. RNA analysis is performed for * genes.   11/24/2022 - 11/25/2022 Hospital Admission   Admitted due to acute pulmonary embolism, lower extremity US negative for DVT.  Patietn was started on heparin gtt and transitioned to Eliquis at discharge.    02/10/2023 Procedure   S/p port placement.    03/10/2023 Imaging   MRI bilateral breast w wo contrast  No abnormal enhancement in the left breast consistent with response to neoadjuvant chemotherapy.    Today she presents for evaluation prior to chemotherapy She tolerates chemotherapy.  On Eliquis 5mg  BID for PE. She experience an episode of palpitation/heart racing and went to ER. Symptom lasted 10 minutes and resolved. Denies chest pain during this time. No syncope. Work up showed negative troponin, EKG showed normal sinus rhythm. She denies any additional episodes.  No fever chills. No concerns of her medi port.    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)    Microcytic anemia    MSSA bacteremia    Multifocal pneumonia    Sepsis (HCC)    due to port a cath   STD (sexually transmitted disease)    From Medical Hx;   Thrombocytopenia (HCC)    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   Vascular port complication     SURGICAL HISTORY: Past Surgical History:  Procedure Laterality Date   BREAST  BIOPSY Right 05/20/2019   Affirm Bx- X- clip, neg   BREAST BIOPSY Left 10/02/2022   [place] Bx, path pending   BREAST BIOPSY Left 10/02/2022   [place] Bx Node- path pending   BREAST BIOPSY Left 10/02/2022   [place] LT BREAST BX W LOC DEV 1ST LESION IMG BX SPEC US GUIDE 10/02/2022 [facility]   CESAREAN SECTION     LAPAROSCOPIC BILATERAL SALPINGECTOMY Bilateral 02/09/2022   Procedure: LAPAROSCOPIC BILATERAL SALPINGECTOMY;  Surgeon: Hildred Laser, MD;  Location: [facility];  Service: Gynecology;  Laterality: Bilateral;   PORTACATH PLACEMENT Right 10/17/2022   Procedure: INSERTION PORT-A-CATH;  Surgeon: Henrene Dodge, MD;  Location: [facility];  Service: General;  Laterality: Right;   PORTACATH PLACEMENT Left 02/10/2023   Procedure: INSERTION PORT-A-CATH;  Surgeon: Sung Amabile, DO;  Location: [facility];  Service: General;  Laterality: Left;   TEE WITHOUT CARDIOVERSION N/A 01/02/2023   Procedure: TRANSESOPHAGEAL ECHOCARDIOGRAM (TEE);  Surgeon: Antonieta Iba, MD;  Location: [facility];  Service: Cardiovascular;  Laterality: N/A;   TUBAL LIGATION      SOCIAL HISTORY: Social History   Socioeconomic History   Marital status: Single    Spouse name: Not on file   Number of children: 2   Years of education: Not on file   Highest education level: High school graduate  Occupational History   Not on file  Tobacco Use   Smoking status: Never    Passive exposure: Never   Smokeless tobacco: Never  Vaping Use   Vaping status: Never Used  Substance and Sexual Activity   Alcohol use: Yes    Comment: occ   Drug use: No   Sexual activity: Yes    Birth control/protection: Surgical  Other Topics Concern   Not on file  Social History Narrative   Not on file   Social Drivers of Health   Financial Resource Strain: Low Risk  (04/16/2023)   Received [facility]   Overall Financial Resource Strain (CARDIA)    Difficulty of Paying Living Expenses: Not hard at all  Food Insecurity: No Food  Insecurity (04/16/2023)   Received from [facility]   Hunger Vital Sign    Worried About Running Out of Food in the Last Year: Never true    Ran Out of Food in the Last Year: Never true  Transportation Needs: No Transportation Needs (04/16/2023)   Received from [facility] - Transportation    In the past 12 months, has lack of transportation kept you from medical appointments or from getting medications?: No    Lack of Transportation (Non-Medical): No  Physical Activity: Not on file  Stress: No Stress Concern Present (10/10/2022)   [employer] of Occupational Health - Occupational Stress Questionnaire    Feeling of Stress : Only a little  Social Connections: Not on file  Intimate  Partner Violence: Not At Risk (12/31/2022)   Humiliation, Afraid, Rape, and Kick questionnaire    Fear of Current or Ex-Partner: No    Emotionally Abused: No    Physically Abused: No    Sexually Abused: No    FAMILY HISTORY: Family History  Problem Relation Age of Onset   Breast cancer Mother 61       no GT   Breast cancer Maternal Aunt 33   Breast cancer Maternal Aunt    Lung cancer Maternal Uncle    Lung cancer Maternal Uncle    Bone cancer Paternal Aunt    Pancreatic cancer Maternal Grandmother 43   Lung cancer Maternal Grandfather 19   Breast cancer Cousin 40   Breast cancer Other 33       gene pos?    ALLERGIES:  has no known allergies.  MEDICATIONS:  Current Outpatient Medications  Medication Sig Dispense Refill   acetaminophen (TYLENOL) 500 MG tablet Take 2 tablets (1,000 mg total) by mouth every 6 (six) hours as needed for mild pain.     apixaban (ELIQUIS) 5 MG TABS tablet Take 1 tablet (5 mg total) by mouth 2 (two) times daily. Start AFTER completing your starter pack 60 tablet 1   HYDROcodone-acetaminophen (NORCO) 5-325 MG tablet Take 1 tablet by mouth every 6 (six) hours as needed for up to 6 doses for moderate pain (pain score 4-6). 6  tablet 0   lidocaine-prilocaine (EMLA) cream Apply 1 Application topically as needed. 30 g 11   senna (SENOKOT) 8.6 MG TABS tablet Take 2 tablets (17.2 mg total) by mouth daily. 120 tablet 2   simethicone (MYLICON) 80 MG chewable tablet Chew 1 tablet (80 mg total) by mouth every 6 (six) hours as needed for flatulence. 30 tablet 2   No current facility-administered medications for this visit.   Facility-Administered Medications Ordered in Other Visits  Medication Dose Route Frequency Provider Last Rate Last Admin   cyclophosphamide (CYTOXAN) 1,000 mg in sodium chloride 0.9 % 250 mL chemo infusion  600 mg/m2 (Treatment Plan Recorded) Intravenous Once [name], MD       dexamethasone (DECADRON) injection 10 mg  10 mg Intravenous Once [name], MD       DOXOrubicin (ADRIAMYCIN) chemo injection 96 mg  60 mg/m2 (Treatment Plan Recorded) Intravenous Once [name], MD       heparin lock flush 100 unit/mL  500 Units Intracatheter Once PRN [name], MD       palonosetron (ALOXI) injection 0.25 mg  0.25 mg Intravenous Once [name], MD       pembrolizumab [facility]) 200 mg in sodium chloride 0.9 % 50 mL chemo infusion  200 mg Intravenous Once [name], MD        Review of Systems  Constitutional:  Negative for appetite change, chills, fatigue and fever.  HENT:   Negative for hearing loss and voice change.   Eyes:  Negative for eye problems.  Respiratory:  Negative for chest tightness and cough.   Cardiovascular:  Negative for chest pain.  Gastrointestinal:  Negative for abdominal distention, abdominal pain and blood in stool.  Endocrine: Negative for hot flashes.  Genitourinary:  Positive for menstrual problem. Negative for difficulty urinating and frequency.   Musculoskeletal:  Negative for arthralgias.  Skin:  Negative for itching and rash.  Neurological:  Negative for extremity weakness and headaches.  Hematological:  Negative for adenopathy.  Psychiatric/Behavioral:  Negative for confusion.       PHYSICAL EXAMINATION:  ECOG PERFORMANCE STATUS: 0 - Asymptomatic  Vitals:   04/25/23 0827  BP: 108/69  Pulse: 65  Resp: 18  Temp: (!) 96 F (35.6 C)  SpO2: 100%   Filed Weights   04/25/23 0827  Weight: 152 lb 4.8 oz (69.1 kg)    Physical Exam Constitutional:      General: She is not in acute distress.    Appearance: She is not diaphoretic.  HENT:     Head: Normocephalic and atraumatic.  Eyes:     General: No scleral icterus. Cardiovascular:     Rate and Rhythm: Normal rate and regular rhythm.  Pulmonary:     Effort: Pulmonary effort is normal. No respiratory distress.     Breath sounds: No wheezing.  Abdominal:     General: Bowel sounds are normal. There is no distension.     Palpations: Abdomen is soft.  Musculoskeletal:        General: Normal range of motion.     Cervical back: Normal range of motion and neck supple.  Skin:    General: Skin is warm and dry.     Findings: No erythema.     Comments: Left anterior chest wall medi port  Neurological:     Mental Status: She is alert and oriented to person, place, and time. Mental status is at baseline.     Motor: No abnormal muscle tone.  Psychiatric:        Mood and Affect: Mood and affect normal.     LABORATORY DATA:  I have reviewed the data as listed    Latest Ref Rng & Units 04/25/2023    8:14 AM 04/04/2023    9:26 PM 04/04/2023    8:30 AM  CBC  WBC 4.0 - 10.5 K/uL 5.6  10.3  6.1   Hemoglobin 12.0 - 15.0 g/dL 78.4  69.6  29.5   Hematocrit 36.0 - 46.0 % 36.3  34.6  34.4   Platelets 150 - 400 K/uL 160  248  201       Latest Ref Rng & Units 04/25/2023    8:14 AM 04/04/2023    9:26 PM 04/04/2023    8:30 AM  CMP  Glucose 70 - 99 mg/dL 284  132  440   BUN 6 - 20 mg/dL 18  11  12    Creatinine 0.44 - 1.00 mg/dL 1.02  7.25  3.66   Sodium 135 - 145 mmol/L 136  137  138   Potassium 3.5 - 5.1 mmol/L 3.7  4.0  3.7   Chloride 98 - 111 mmol/L 105  105  106   CO2 22 - 32 mmol/L 23  21  24    Calcium 8.9 -  10.3 mg/dL 8.9  9.5  8.9   Total Protein 6.5 - 8.1 g/dL 7.2  7.4  6.7   Total Bilirubin 0.0 - 1.2 mg/dL 0.4  0.5  0.3   Alkaline Phos 38 - 126 U/L 81  80  84   AST 15 - 41 U/L 27  29  27    ALT 0 - 44 U/L 32  36  32      RADIOGRAPHIC STUDIES: I have personally reviewed the radiological images as listed and agreed with the findings in the report. DG Chest Port 1 View Result Date: 04/04/2023 CLINICAL DATA:  Palpitation EXAM: PORTABLE CHEST 1 VIEW COMPARISON:  02/10/2023 FINDINGS: Left-sided central venous port tip over the brachiocephalic confluence. No acute airspace disease or effusion. Normal cardiac size. Epigastric lucency probably  represents air distended bowel. IMPRESSION: No active disease. Epigastric lucency probably represents air distended bowel, this may be correlated with dedicated abdominal radiograph. Electronically Signed   By: Jasmine Pang M.D.   On: 04/04/2023 23:16

## 2023-04-25 NOTE — Assessment & Plan Note (Signed)
 Continue Eliquis 5mg  BID

## 2023-04-25 NOTE — Assessment & Plan Note (Signed)
 Labs are reviewed and discussed with patient. Lab Results  Component Value Date   HGB 11.1 (L) 04/04/2023   TIBC 377 02/28/2023   IRONPCTSAT 11 02/28/2023   FERRITIN 23 02/28/2023   Hemoglobin is stable. IV venofer as needed.

## 2023-04-25 NOTE — Patient Instructions (Signed)
 [facility]  Discharge Instructions: Thank you for choosing [facility] to provide your oncology and hematology care.  If you have a lab appointment with the [facility], please go directly to the [facility] and check in at the registration area.  Wear comfortable clothing and clothing appropriate for easy access to any Portacath or PICC line.   We strive to give you quality time with your provider. You may need to reschedule your appointment if you arrive late (15 or more minutes).  Arriving late affects you and other patients whose appointments are after yours.  Also, if you miss three or more appointments without notifying the office, you may be dismissed from the clinic at the provider's discretion.      For prescription refill requests, have your pharmacy contact our office and allow 72 hours for refills to be completed.    Today you received the following chemotherapy and/or immunotherapy agents KEYTRUDA, DOXIRUBICIN, CYTOXAN      To help prevent nausea and vomiting after your treatment, we encourage you to take your nausea medication as directed.  BELOW ARE SYMPTOMS THAT SHOULD BE REPORTED IMMEDIATELY: *FEVER GREATER THAN 100.4 F (38 C) OR HIGHER *CHILLS OR SWEATING *NAUSEA AND VOMITING THAT IS NOT CONTROLLED WITH YOUR NAUSEA MEDICATION *UNUSUAL SHORTNESS OF BREATH *UNUSUAL BRUISING OR BLEEDING *URINARY PROBLEMS (pain or burning when urinating, or frequent urination) *BOWEL PROBLEMS (unusual diarrhea, constipation, pain near the anus) TENDERNESS IN MOUTH AND THROAT WITH OR WITHOUT PRESENCE OF ULCERS (sore throat, sores in mouth, or a toothache) UNUSUAL RASH, SWELLING OR PAIN  UNUSUAL VAGINAL DISCHARGE OR ITCHING   Items with * indicate a potential emergency and should be followed up as soon as possible or go to the Emergency Department if any problems should occur.  Please show the CHEMOTHERAPY ALERT  CARD or IMMUNOTHERAPY ALERT CARD at check-in to the Emergency Department and triage nurse.  Should you have questions after your visit or need to cancel or reschedule your appointment, please contact [facility] - A DEPT OF [facility]  [phone] and follow the prompts.  Office hours are 8:00 a.m. to 4:30 p.m. Monday - Friday. Please note that voicemails left after 4:00 p.m. may not be returned until the following business day.  We are closed weekends and major holidays. You have access to a nurse at all times for urgent questions. Please call the main number to the clinic [phone] and follow the prompts.  For any non-urgent questions, you may also contact your provider using MyChart. We now offer e-Visits for anyone 26 and older to request care online for non-urgent symptoms. For details visit [email or web address].   Also download the MyChart app! Go to the app store, search "MyChart", open the app, select [place], and log in with your MyChart username and password.  Pembrolizumab Injection What is this medication? PEMBROLIZUMAB (PEM broe LIZ ue mab) treats some types of cancer. It works by helping your immune system slow or stop the spread of cancer cells. It is a monoclonal antibody. This medicine may be used for other purposes; ask your health care provider or pharmacist if you have questions. COMMON BRAND NAME(S): Keytruda What should I tell my care team before I take this medication? They need to know if you have any of these conditions: Allogeneic stem cell transplant (uses someone else's stem cells) Autoimmune diseases, such as  Crohn disease, ulcerative colitis, lupus History of chest radiation Nervous system problems, such as Guillain-Barre syndrome, myasthenia gravis Organ transplant An unusual or allergic reaction to pembrolizumab, other medications, foods, dyes, or preservatives Pregnant or trying to get pregnant Breast-feeding How  should I use this medication? This medication is injected into a vein. It is given by your care team in a hospital or clinic setting. A special MedGuide will be given to you before each treatment. Be sure to read this information carefully each time. Talk to your care team about the use of this medication in children. While it may be prescribed for children as young as 6 months for selected conditions, precautions do apply. Overdosage: If you think you have taken too much of this medicine contact a poison control [facility] or emergency room at once. NOTE: This medicine is only for you. Do not share this medicine with others. What if I miss a dose? Keep appointments for follow-up doses. It is important not to miss your dose. Call your care team if you are unable to keep an appointment. What may interact with this medication? Interactions have not been studied. This list may not describe all possible interactions. Give your health care provider a list of all the medicines, herbs, non-prescription drugs, or dietary supplements you use. Also tell them if you smoke, drink alcohol, or use illegal drugs. Some items may interact with your medicine. What should I watch for while using this medication? Your condition will be monitored carefully while you are receiving this medication. You may need blood work while taking this medication. This medication may cause serious skin reactions. They can happen weeks to months after starting the medication. Contact your care team right away if you notice fevers or flu-like symptoms with a rash. The rash may be red or purple and then turn into blisters or peeling of the skin. You may also notice a red rash with swelling of the face, lips, or lymph nodes in your neck or under your arms. Tell your care team right away if you have any change in your eyesight. Talk to your care team if you may be pregnant. Serious birth defects can occur if you take this medication during  pregnancy and for 4 months after the last dose. You will need a negative pregnancy test before starting this medication. Contraception is recommended while taking this medication and for 4 months after the last dose. Your care team can help you find the option that works for you. Do not breastfeed while taking this medication and for 4 months after the last dose. What side effects may I notice from receiving this medication? Side effects that you should report to your care team as soon as possible: Allergic reactions--skin rash, itching, hives, swelling of the face, lips, tongue, or throat Dry cough, shortness of breath or trouble breathing Eye pain, redness, irritation, or discharge with blurry or decreased vision Heart muscle inflammation--unusual weakness or fatigue, shortness of breath, chest pain, fast or irregular heartbeat, dizziness, swelling of the ankles, feet, or hands Hormone gland problems--headache, sensitivity to light, unusual weakness or fatigue, dizziness, fast or irregular heartbeat, increased sensitivity to cold or heat, excessive sweating, constipation, hair loss, increased thirst or amount of urine, tremors or shaking, irritability Infusion reactions--chest pain, shortness of breath or trouble breathing, feeling faint or lightheaded Kidney injury (glomerulonephritis)--decrease in the amount of urine, red or dark brown urine, foamy or bubbly urine, swelling of the ankles, hands, or feet Liver injury--right upper belly  pain, loss of appetite, nausea, light-colored stool, dark yellow or brown urine, yellowing skin or eyes, unusual weakness or fatigue Pain, tingling, or numbness in the hands or feet, muscle weakness, change in vision, confusion or trouble speaking, loss of balance or coordination, trouble walking, seizures Rash, fever, and swollen lymph nodes Redness, blistering, peeling, or loosening of the skin, including inside the mouth Sudden or severe stomach pain, bloody  diarrhea, fever, nausea, vomiting Side effects that usually do not require medical attention (report to your care team if they continue or are bothersome): Bone, joint, or muscle pain Diarrhea Fatigue Loss of appetite Nausea Skin rash This list may not describe all possible side effects. Call your doctor for medical advice about side effects. You may report side effects to FDA at 1-800-FDA-1088. Where should I keep my medication? This medication is given in a hospital or clinic. It will not be stored at home. NOTE: This sheet is a summary. It may not cover all possible information. If you have questions about this medicine, talk to your doctor, pharmacist, or health care provider.  2024 Elsevier/Gold Standard (2021-06-12 00:00:00)  Doxorubicin Injection What is this medication? DOXORUBICIN (dox oh ROO bi sin) treats some types of cancer. It works by slowing down the growth of cancer cells. This medicine may be used for other purposes; ask your health care provider or pharmacist if you have questions. COMMON BRAND NAME(S): Adriamycin, Adriamycin PFS, Adriamycin RDF, Rubex What should I tell my care team before I take this medication? They need to know if you have any of these conditions: Heart disease History of low blood cell levels caused by a medication Liver disease Recent or ongoing radiation An unusual or allergic reaction to doxorubicin, other medications, foods, dyes, or preservatives If you or your partner are pregnant or trying to get pregnant Breast-feeding How should I use this medication? This medication is injected into a vein. It is given by your care team in a hospital or clinic setting. Talk to your care team about the use of this medication in children. Special care may be needed. Overdosage: If you think you have taken too much of this medicine contact a poison control [facility] or emergency room at once. NOTE: This medicine is only for you. Do not share this medicine  with others. What if I miss a dose? Keep appointments for follow-up doses. It is important not to miss your dose. Call your care team if you are unable to keep an appointment. What may interact with this medication? 6-mercaptopurine Paclitaxel Phenytoin St. John's wort Trastuzumab Verapamil This list may not describe all possible interactions. Give your health care provider a list of all the medicines, herbs, non-prescription drugs, or dietary supplements you use. Also tell them if you smoke, drink alcohol, or use illegal drugs. Some items may interact with your medicine. What should I watch for while using this medication? Your condition will be monitored carefully while you are receiving this medication. You may need blood work while taking this medication. This medication may make you feel generally unwell. This is not uncommon as chemotherapy can affect healthy cells as well as cancer cells. Report any side effects. Continue your course of treatment even though you feel ill unless your care team tells you to stop. There is a maximum amount of this medication you should receive throughout your life. The amount depends on the medical condition being treated and your overall health. Your care team will watch how much of this medication you  receive. Tell your care team if you have taken this medication before. Your urine may turn red for a few days after your dose. This is not blood. If your urine is dark or brown, call your care team. In some cases, you may be given additional medications to help with side effects. Follow all directions for their use. This medication may increase your risk of getting an infection. Call your care team for advice if you get a fever, chills, sore throat, or other symptoms of a cold or flu. Do not treat yourself. Try to avoid being around people who are sick. This medication may increase your risk to bruise or bleed. Call your care team if you notice any unusual  bleeding. Talk to your care team about your risk of cancer. You may be more at risk for certain types of cancers if you take this medication. Talk to your care team if you or your partner may be pregnant. Serious birth defects can occur if you take this medication during pregnancy and for 6 months after the last dose. Contraception is recommended while taking this medication and for 6 months after the last dose. Your care team can help you find the option that works for you. If your partner can get pregnant, use a condom while taking this medication and for 6 months after the last dose. Do not breastfeed while taking this medication. This medication may cause infertility. Talk to your care team if you are concerned about your fertility. What side effects may I notice from receiving this medication? Side effects that you should report to your care team as soon as possible: Allergic reactions--skin rash, itching, hives, swelling of the face, lips, tongue, or throat Heart failure--shortness of breath, swelling of the ankles, feet, or hands, sudden weight gain, unusual weakness or fatigue Heart rhythm changes--fast or irregular heartbeat, dizziness, feeling faint or lightheaded, chest pain, trouble breathing Infection--fever, chills, cough, sore throat, wounds that don't heal, pain or trouble when passing urine, general feeling of discomfort or being unwell Low red blood cell level--unusual weakness or fatigue, dizziness, headache, trouble breathing Painful swelling, warmth, or redness of the skin, blisters or sores at the infusion site Unusual bruising or bleeding Side effects that usually do not require medical attention (report to your care team if they continue or are bothersome): Diarrhea Hair loss Nausea Pain, redness, or swelling with sores inside the mouth or throat Red urine This list may not describe all possible side effects. Call your doctor for medical advice about side effects. You may  report side effects to FDA at 1-800-FDA-1088. Where should I keep my medication? This medication is given in a hospital or clinic. It will not be stored at home. NOTE: This sheet is a summary. It may not cover all possible information. If you have questions about this medicine, talk to your doctor, pharmacist, or health car   Cyclophosphamide Injection What is this medication? CYCLOPHOSPHAMIDE (sye kloe FOSS fa mide) treats some types of cancer. It works by slowing down the growth of cancer cells. This medicine may be used for other purposes; ask your health care provider or pharmacist if you have questions. COMMON BRAND NAME(S): Cyclophosphamide, Cytoxan, Neosar What should I tell my care team before I take this medication? They need to know if you have any of these conditions: Heart disease Irregular heartbeat or rhythm Infection Kidney problems Liver disease Low blood cell levels (white cells, platelets, or red blood cells) Lung disease Previous radiation Trouble passing  urine An unusual or allergic reaction to cyclophosphamide, other medications, foods, dyes, or preservatives Pregnant or trying to get pregnant Breast-feeding How should I use this medication? This medication is injected into a vein. It is given by your care team in a hospital or clinic setting. Talk to your care team about the use of this medication in children. Special care may be needed. Overdosage: If you think you have taken too much of this medicine contact a poison control [facility] or emergency room at once. NOTE: This medicine is only for you. Do not share this medicine with others. What if I miss a dose? Keep appointments for follow-up doses. It is important not to miss your dose. Call your care team if you are unable to keep an appointment. What may interact with this medication? Amphotericin B Amiodarone Azathioprine Certain antivirals for HIV or hepatitis Certain medications for blood pressure, such  as enalapril, lisinopril, quinapril Cyclosporine Diuretics Etanercept Indomethacin Medications that relax muscles Metronidazole Natalizumab Tamoxifen Warfarin This list may not describe all possible interactions. Give your health care provider a list of all the medicines, herbs, non-prescription drugs, or dietary supplements you use. Also tell them if you smoke, drink alcohol, or use illegal drugs. Some items may interact with your medicine. What should I watch for while using this medication? This medication may make you feel generally unwell. This is not uncommon as chemotherapy can affect healthy cells as well as cancer cells. Report any side effects. Continue your course of treatment even though you feel ill unless your care team tells you to stop. You may need blood work while you are taking this medication. This medication may increase your risk of getting an infection. Call your care team for advice if you get a fever, chills, sore throat, or other symptoms of a cold or flu. Do not treat yourself. Try to avoid being around people who are sick. Avoid taking medications that contain aspirin, acetaminophen, ibuprofen, naproxen, or ketoprofen unless instructed by your care team. These medications may hide a fever. Be careful brushing or flossing your teeth or using a toothpick because you may get an infection or bleed more easily. If you have any dental work done, tell your dentist you are receiving this medication. Drink water or other fluids as directed. Urinate often, even at night. Some products may contain alcohol. Ask your care team if this medication contains alcohol. Be sure to tell all care teams you are taking this medicine. Certain medicines, like metronidazole and disulfiram, can cause an unpleasant reaction when taken with alcohol. The reaction includes flushing, headache, nausea, vomiting, sweating, and increased thirst. The reaction can last from 30 minutes to several hours. Talk  to your care team if you wish to become pregnant or think you might be pregnant. This medication can cause serious birth defects if taken during pregnancy and for 1 year after the last dose. A negative pregnancy test is required before starting this medication. A reliable form of contraception is recommended while taking this medication and for 1 year after the last dose. Talk to your care team about reliable forms of contraception. Do not father a child while taking this medication and for 4 months after the last dose. Use a condom during this time period. Do not breast-feed while taking this medication or for 1 week after the last dose. This medication may cause infertility. Talk to your care team if you are concerned about your fertility. Talk to your care team about your risk  of cancer. You may be more at risk for certain types of cancer if you take this medication. What side effects may I notice from receiving this medication? Side effects that you should report to your care team as soon as possible: Allergic reactions--skin rash, itching, hives, swelling of the face, lips, tongue, or throat Dry cough, shortness of breath or trouble breathing Heart failure--shortness of breath, swelling of the ankles, feet, or hands, sudden weight gain, unusual weakness or fatigue Heart muscle inflammation--unusual weakness or fatigue, shortness of breath, chest pain, fast or irregular heartbeat, dizziness, swelling of the ankles, feet, or hands Heart rhythm changes--fast or irregular heartbeat, dizziness, feeling faint or lightheaded, chest pain, trouble breathing Infection--fever, chills, cough, sore throat, wounds that don't heal, pain or trouble when passing urine, general feeling of discomfort or being unwell Kidney injury--decrease in the amount of urine, swelling of the ankles, hands, or feet Liver injury--right upper belly pain, loss of appetite, nausea, light-colored stool, dark yellow or brown urine,  yellowing skin or eyes, unusual weakness or fatigue Low red blood cell level--unusual weakness or fatigue, dizziness, headache, trouble breathing Low sodium level--muscle weakness, fatigue, dizziness, headache, confusion Red or dark brown urine Unusual bruising or bleeding Side effects that usually do not require medical attention (report to your care team if they continue or are bothersome): Hair loss Irregular menstrual cycles or spotting Loss of appetite Nausea Pain, redness, or swelling with sores inside the mouth or throat Vomiting This list may not describe all possible side effects. Call your doctor for medical advice about side effects. You may report side effects to FDA at 1-800-FDA-1088. Where should I keep my medication? This medication is given in a hospital or clinic. It will not be stored at home. NOTE: This sheet is a summary. It may not cover all possible information. If you have questions about this medicine, talk to your doctor, pharmacist, or health care provider.  2024 Elsevier/Gold Standard (2021-06-15 00:00:00)

## 2023-04-28 ENCOUNTER — Inpatient Hospital Stay: Payer: Medicaid Other

## 2023-04-28 DIAGNOSIS — Z5112 Encounter for antineoplastic immunotherapy: Secondary | ICD-10-CM | POA: Diagnosis not present

## 2023-04-28 DIAGNOSIS — C50919 Malignant neoplasm of unspecified site of unspecified female breast: Secondary | ICD-10-CM

## 2023-04-28 MED ORDER — PEGFILGRASTIM-CBQV 6 MG/0.6ML ~~LOC~~ SOSY
6.0000 mg | PREFILLED_SYRINGE | Freq: Once | SUBCUTANEOUS | Status: AC
  Administered 2023-04-28: 6 mg via SUBCUTANEOUS
  Filled 2023-04-28: qty 0.6

## 2023-05-07 ENCOUNTER — Other Ambulatory Visit: Payer: Self-pay

## 2023-05-15 MED FILL — Fosaprepitant Dimeglumine For IV Infusion 150 MG (Base Eq): INTRAVENOUS | Qty: 5 | Status: AC

## 2023-05-16 ENCOUNTER — Encounter: Payer: Self-pay | Admitting: *Deleted

## 2023-05-16 ENCOUNTER — Inpatient Hospital Stay (HOSPITAL_BASED_OUTPATIENT_CLINIC_OR_DEPARTMENT_OTHER): Payer: Medicaid Other | Admitting: Oncology

## 2023-05-16 ENCOUNTER — Encounter: Payer: Self-pay | Admitting: Oncology

## 2023-05-16 ENCOUNTER — Inpatient Hospital Stay: Payer: Medicaid Other

## 2023-05-16 ENCOUNTER — Telehealth: Payer: Self-pay

## 2023-05-16 ENCOUNTER — Inpatient Hospital Stay: Payer: Medicaid Other | Attending: Oncology

## 2023-05-16 VITALS — BP 113/64 | HR 68 | Temp 97.5°F | Resp 18 | Wt 153.2 lb

## 2023-05-16 VITALS — BP 110/73 | HR 76 | Temp 96.0°F | Resp 19

## 2023-05-16 DIAGNOSIS — Z5111 Encounter for antineoplastic chemotherapy: Secondary | ICD-10-CM | POA: Insufficient documentation

## 2023-05-16 DIAGNOSIS — C50312 Malignant neoplasm of lower-inner quadrant of left female breast: Secondary | ICD-10-CM | POA: Diagnosis present

## 2023-05-16 DIAGNOSIS — Z1722 Progesterone receptor negative status: Secondary | ICD-10-CM | POA: Diagnosis not present

## 2023-05-16 DIAGNOSIS — Z5112 Encounter for antineoplastic immunotherapy: Secondary | ICD-10-CM | POA: Diagnosis present

## 2023-05-16 DIAGNOSIS — C50919 Malignant neoplasm of unspecified site of unspecified female breast: Secondary | ICD-10-CM

## 2023-05-16 DIAGNOSIS — Z1732 Human epidermal growth factor receptor 2 negative status: Secondary | ICD-10-CM | POA: Diagnosis not present

## 2023-05-16 DIAGNOSIS — Z1501 Genetic susceptibility to malignant neoplasm of breast: Secondary | ICD-10-CM | POA: Diagnosis not present

## 2023-05-16 DIAGNOSIS — Z7901 Long term (current) use of anticoagulants: Secondary | ICD-10-CM | POA: Diagnosis not present

## 2023-05-16 DIAGNOSIS — Z5189 Encounter for other specified aftercare: Secondary | ICD-10-CM | POA: Diagnosis not present

## 2023-05-16 DIAGNOSIS — I2699 Other pulmonary embolism without acute cor pulmonale: Secondary | ICD-10-CM

## 2023-05-16 DIAGNOSIS — Z1509 Genetic susceptibility to other malignant neoplasm: Secondary | ICD-10-CM | POA: Insufficient documentation

## 2023-05-16 DIAGNOSIS — Z171 Estrogen receptor negative status [ER-]: Secondary | ICD-10-CM | POA: Diagnosis not present

## 2023-05-16 DIAGNOSIS — Z86711 Personal history of pulmonary embolism: Secondary | ICD-10-CM | POA: Insufficient documentation

## 2023-05-16 LAB — CBC WITH DIFFERENTIAL (CANCER CENTER ONLY)
Abs Immature Granulocytes: 0.03 10*3/uL (ref 0.00–0.07)
Basophils Absolute: 0 10*3/uL (ref 0.0–0.1)
Basophils Relative: 1 %
Eosinophils Absolute: 0 10*3/uL (ref 0.0–0.5)
Eosinophils Relative: 0 %
HCT: 36.5 % (ref 36.0–46.0)
Hemoglobin: 12.1 g/dL (ref 12.0–15.0)
Immature Granulocytes: 0 %
Lymphocytes Relative: 17 %
Lymphs Abs: 1.3 10*3/uL (ref 0.7–4.0)
MCH: 30.6 pg (ref 26.0–34.0)
MCHC: 33.2 g/dL (ref 30.0–36.0)
MCV: 92.2 fL (ref 80.0–100.0)
Monocytes Absolute: 0.5 10*3/uL (ref 0.1–1.0)
Monocytes Relative: 8 %
Neutro Abs: 5.3 10*3/uL (ref 1.7–7.7)
Neutrophils Relative %: 74 %
Platelet Count: 187 10*3/uL (ref 150–400)
RBC: 3.96 MIL/uL (ref 3.87–5.11)
RDW: 15.9 % — ABNORMAL HIGH (ref 11.5–15.5)
WBC Count: 7.2 10*3/uL (ref 4.0–10.5)
nRBC: 0 % (ref 0.0–0.2)

## 2023-05-16 LAB — CMP (CANCER CENTER ONLY)
ALT: 26 U/L (ref 0–44)
AST: 21 U/L (ref 15–41)
Albumin: 4 g/dL (ref 3.5–5.0)
Alkaline Phosphatase: 95 U/L (ref 38–126)
Anion gap: 8 (ref 5–15)
BUN: 12 mg/dL (ref 6–20)
CO2: 23 mmol/L (ref 22–32)
Calcium: 9 mg/dL (ref 8.9–10.3)
Chloride: 107 mmol/L (ref 98–111)
Creatinine: 0.46 mg/dL (ref 0.44–1.00)
GFR, Estimated: >60 mL/min (ref >60)
Glucose, Bld: 108 mg/dL — ABNORMAL HIGH (ref 70–99)
Potassium: 3.7 mmol/L (ref 3.5–5.1)
Sodium: 138 mmol/L (ref 135–145)
Total Bilirubin: 0.4 mg/dL (ref 0.0–1.2)
Total Protein: 7.5 g/dL (ref 6.5–8.1)

## 2023-05-16 MED ORDER — SODIUM CHLORIDE 0.9 % IV SOLN
150.0000 mg | Freq: Once | INTRAVENOUS | Status: AC
  Administered 2023-05-16: 150 mg via INTRAVENOUS
  Filled 2023-05-16: qty 150

## 2023-05-16 MED ORDER — DEXAMETHASONE SODIUM PHOSPHATE 10 MG/ML IJ SOLN
10.0000 mg | Freq: Once | INTRAMUSCULAR | Status: AC
  Administered 2023-05-16: 10 mg via INTRAVENOUS
  Filled 2023-05-16: qty 1

## 2023-05-16 MED ORDER — SODIUM CHLORIDE 0.9 % IV SOLN
600.0000 mg/m2 | Freq: Once | INTRAVENOUS | Status: AC
  Administered 2023-05-16: 1000 mg via INTRAVENOUS
  Filled 2023-05-16: qty 50

## 2023-05-16 MED ORDER — HEPARIN SOD (PORK) LOCK FLUSH 100 UNIT/ML IV SOLN
500.0000 [IU] | Freq: Once | INTRAVENOUS | Status: AC | PRN
  Administered 2023-05-16: 500 [IU]
  Filled 2023-05-16: qty 5

## 2023-05-16 MED ORDER — SODIUM CHLORIDE 0.9 % IV SOLN
200.0000 mg | Freq: Once | INTRAVENOUS | Status: AC
  Administered 2023-05-16: 200 mg via INTRAVENOUS
  Filled 2023-05-16: qty 8

## 2023-05-16 MED ORDER — PALONOSETRON HCL INJECTION 0.25 MG/5ML
0.2500 mg | Freq: Once | INTRAVENOUS | Status: AC
Start: 2023-05-16 — End: 2023-05-16
  Administered 2023-05-16: 0.25 mg via INTRAVENOUS
  Filled 2023-05-16: qty 5

## 2023-05-16 MED ORDER — SODIUM CHLORIDE 0.9 % IV SOLN
Freq: Once | INTRAVENOUS | Status: AC
  Filled 2023-05-16: qty 250

## 2023-05-16 MED ORDER — DOXORUBICIN HCL CHEMO IV INJECTION 2 MG/ML
60.0000 mg/m2 | Freq: Once | INTRAVENOUS | Status: AC
  Administered 2023-05-16: 96 mg via INTRAVENOUS
  Filled 2023-05-16: qty 48

## 2023-05-16 NOTE — Progress Notes (Signed)
 Ms. Kleckley finishes chemo today.   She is scheduled to see Dr. Tonna Boehringer on Monday 4/7.  Appt details given to her.

## 2023-05-16 NOTE — Patient Instructions (Signed)
 [facility]  Discharge Instructions: Thank you for choosing [facility] to provide your oncology and hematology care.  If you have a lab appointment with the Cancer Center, please go directly to the Cancer Center and check in at the registration area.  Wear comfortable clothing and clothing appropriate for easy access to any Portacath or PICC line.   We strive to give you quality time with your provider. You may need to reschedule your appointment if you arrive late (15 or more minutes).  Arriving late affects you and other patients whose appointments are after yours.  Also, if you miss three or more appointments without notifying the office, you may be dismissed from the clinic at the provider's discretion.      For prescription refill requests, have your pharmacy contact our office and allow 72 hours for refills to be completed.    Today you received the following chemotherapy and/or immunotherapy agents keytruda, doxorubicin, and cytoxan      To help prevent nausea and vomiting after your treatment, we encourage you to take your nausea medication as directed.  BELOW ARE SYMPTOMS THAT SHOULD BE REPORTED IMMEDIATELY: *FEVER GREATER THAN 100.4 F (38 C) OR HIGHER *CHILLS OR SWEATING *NAUSEA AND VOMITING THAT IS NOT CONTROLLED WITH YOUR NAUSEA MEDICATION *UNUSUAL SHORTNESS OF BREATH *UNUSUAL BRUISING OR BLEEDING *URINARY PROBLEMS (pain or burning when urinating, or frequent urination) *BOWEL PROBLEMS (unusual diarrhea, constipation, pain near the anus) TENDERNESS IN MOUTH AND THROAT WITH OR WITHOUT PRESENCE OF ULCERS (sore throat, sores in mouth, or a toothache) UNUSUAL RASH, SWELLING OR PAIN  UNUSUAL VAGINAL DISCHARGE OR ITCHING   Items with * indicate a potential emergency and should be followed up as soon as possible or go to the Emergency Department if any problems should occur.  Please show the CHEMOTHERAPY ALERT  CARD or IMMUNOTHERAPY ALERT CARD at check-in to the Emergency Department and triage nurse.  Should you have questions after your visit or need to cancel or reschedule your appointment, please contact [facility] - A DEPT OF [facility]  [phone] and follow the prompts.  Office hours are 8:00 a.m. to 4:30 p.m. Monday - Friday. Please note that voicemails left after 4:00 p.m. may not be returned until the following business day.  We are closed weekends and major holidays. You have access to a nurse at all times for urgent questions. Please call the main number to the clinic [phone] and follow the prompts.  For any non-urgent questions, you may also contact your provider using MyChart. We now offer e-Visits for anyone 44 and older to request care online for non-urgent symptoms. For details visit [email or web address].   Also download the MyChart app! Go to the app store, search "MyChart", open the app, select , and log in with your MyChart username and password.

## 2023-05-16 NOTE — Assessment & Plan Note (Addendum)
 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  Images and pathology results were reviewed and discussed with patient.  MRI showed  6.7 cm of abnormal enhancement, predominantly in the lower-inner inferior left breast, with the ultrasound-guided biopsy marking clip centrally positioned within the enhancement. Suspicious for involvement by cancer, cT3 disease.  Patient desires mastectomy. Patient has no desire of preserving fertility.  S/p Carboplatin, taxol Keytruda Q3 weeks x 4 cycle --MRI showed positive treatment response.  Labs are reviewed and discussed with patient. Proceed with cycle 4 AC + Keytrua with D4 GCSF Discussed with Dr. Tonna Boehringer. Patient will follow up with him for evaluation of surgery, plan bilateral mastectomy.

## 2023-05-16 NOTE — Assessment & Plan Note (Signed)
 Chemotherapy plan as listed above

## 2023-05-16 NOTE — Progress Notes (Signed)
 Hematology/Oncology Progress note [phone] [phone]       CHIEF COMPLAINTS/PURPOSE OF CONSULTATION:  Left triple negative breast cancer.   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  Images and pathology results were reviewed and discussed with patient.  MRI showed  6.7 cm of abnormal enhancement, predominantly in the lower-inner inferior left breast, with the ultrasound-guided biopsy marking clip centrally positioned within the enhancement. Suspicious for involvement by cancer, cT3 disease.  Patient desires mastectomy. Patient has no desire of preserving fertility.  S/p Carboplatin, taxol Keytruda Q3 weeks x 4 cycle --MRI showed positive treatment response.  Labs are reviewed and discussed with patient. Proceed with cycle 4 AC + Keytrua with D4 GCSF Discussed with Dr. Tonna Boehringer. Patient will follow up with him for evaluation of surgery, plan bilateral mastectomy.    Acute pulmonary embolism (HCC) Likely provoked, continue Eliquis 5mg  BID, plan 6 months.  She has been on anticoagulation for 4+ months  Ok to hold of Eliquis for 2 days prior to surgery and resume afterwards.   BRCA1 gene mutation positive Once she finished treatment of breast cancer, recommend risk-reducing salpingo oophorectomy  pancreatic screening at age 70 (or 66 years younger than the earliest exocrine pancreatic cancer diagnosis in the family, whichever is earlier)  Family members are encouraged to consider genetic testing for this familial pathogenic variant.   Encounter for antineoplastic chemotherapy Chemotherapy plan as listed above.   No orders of the defined types were placed in this encounter.  Follow-up TBD    All questions were answered. The patient knows to call the clinic with any problems, questions or concerns.  [name], MD, PhD [facility] Hematology Oncology 05/16/2023    HISTORY OF PRESENTING ILLNESS:  Penny Gomez 38 y.o. female presents to establish care for left triple negative breast cancer I have reviewed her chart and materials related to her cancer extensively and collaborated history with the patient. Summary of oncologic history is as follows: Oncology History  Invasive carcinoma of breast (HCC)  07/24/2022 Mammogram   She noticed left breast mass for 1 month.  Bilateral diagnostic mammogram showed Suspicious palpable left breast mass 8 o'clock position. Cortically thickened left axillary lymph node   10/10/2022 Initial Diagnosis   Invasive carcinoma of breast (HCC)  10/02/2022  Left breast mass biopsy and left axillary lymph node biopsy.  Diagnosis 1. Breast, left, needle core biopsy, 8 o'clock 6 cmfn, heart clip - INVASIVE MAMMARY CARCINOMA, NO SPECIAL TYPE. - TUBULE FORMATION: SCORE 3 - NUCLEAR PLEOMORPHISM: SCORE 3 - MITOTIC COUNT: SCORE 3 - TOTAL SCORE: 9 - OVERALL GRADE: 3 - LYMPHOVASCULAR INVASION: NOT IDENTIFIED - CANCER LENGTH: 11 MM - CALCIFICATIONS: NOT IDENTIFIED - DUCTAL CARCINOMA IN SITU: PRESENT, HIGH-GRADE - ER-, PR- HER2 - [IHC 1+] 2. Lymph node, needle/core biopsy, left axillary, hydromark - LYMPH NODE WITH REACTIVE CHANGES; NEGATIVE FOR MALIGNANCY.  Menarche at age of 60 First live birth at age of 43 OCP use: no History of hysterectomy: no Menopausal status: premenopausal History of HRT use: no History of chest radiation: no Number of previous breast biopsies:  right breast biopsy 05/20/2019 negative for malignancy Strong family history of cancer mother breast cancer diagnosed in 25s, maternal aunts x  2 breast cancer, maternal cousins breast cancer x 2, maternal grandmother pancreatic cancer.    10/10/2022 Cancer Staging   Staging form:  Breast, AJCC 8th Edition - Clinical stage from 10/10/2022: Stage IIIB (cT3, cN0, cM0, G3, ER-, PR-, HER2-) - Signed by [name], MD on 10/18/2022 Stage prefix: Initial diagnosis Nuclear grade: G3 Histologic grading system: 3 grade system   10/17/2022 Imaging   Bilateral MRI breasts w wo contrast  1. In total, there is approximately 6.7 cm of abnormal enhancement, predominantly in the lower-inner inferior left breast, with the ultrasound-guided biopsy marking clip centrally positioned within the enhancement. There are scattered small enhancing masses in the lower inner and lower outer anterior left (included in the measurement above).   2.  No evidence of right breast malignancy.   3. The axillary lymph nodes are not included within the field of view of this MRI for adequate assessment of lymphadenopathy.    10/17/2022 Procedure   S/p medi port placement.    10/25/2022 - 10/25/2022 Chemotherapy   Patient is on Treatment Plan : BREAST ADJUVANT DOSE DENSE AC q14d / PACLitaxel q7d     10/25/2022 -  Chemotherapy   Patient is on Treatment Plan : BREAST Pembrolizumab (200) D1 + Carboplatin (5) D1 + Paclitaxel (80) D1,8,15 q21d X 4 cycles / Pembrolizumab (200) D1 + AC D1 q21d x 4 cycles      Genetic Testing   Single pathogenic variant in BRCA1 called c.190T>G identified on the [facility] Multi-Cancer+RNA panel. The remainder of testing was normal. The report date is 11/11/2022.  The Multi-Cancer + RNA Panel offered by [facility] includes sequencing and/or deletion/duplication analysis of the following 70 genes:  AIP*, ALK, APC*, ATM*, AXIN2*, BAP1*, BARD1*, BLM*, BMPR1A*, BRCA1*, BRCA2*, BRIP1*, CDC73*, CDH1*, CDK4, CDKN1B*, CDKN2A, CHEK2*, CTNNA1*, DICER1*, EPCAM, EGFR, FH*, FLCN*, GREM1, HOXB13, KIT, LZTR1, MAX*, MBD4, MEN1*, MET, MITF, MLH1*, MSH2*, MSH3*, MSH6*, MUTYH*, NF1*, NF2*, NTHL1*, PALB2*, PDGFRA, PMS2*, POLD1*, POLE*, POT1*, PRKAR1A*, PTCH1*, PTEN*, RAD51C*, RAD51D*, RB1*, RET, SDHA*, SDHAF2*, SDHB*,  SDHC*, SDHD*, SMAD4*, SMARCA4*, SMARCB1*, SMARCE1*, STK11*, SUFU*, TMEM127*, TP53*, TSC1*, TSC2*, VHL*. RNA analysis is performed for * genes.   11/24/2022 - 11/25/2022 Hospital Admission   Admitted due to acute pulmonary embolism, lower extremity US negative for DVT.  Patietn was started on heparin gtt and transitioned to Eliquis at discharge.    02/10/2023 Procedure   S/p port placement.    03/10/2023 Imaging   MRI bilateral breast w wo contrast  No abnormal enhancement in the left breast consistent with response to neoadjuvant chemotherapy.    Today she presents for evaluation prior to chemotherapy She tolerates chemotherapy.  On Eliquis 5mg  BID for PE.  No fever chills. No concerns of her medi port.    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   Vascular port complication     SURGICAL HISTORY: Past Surgical History:  Procedure Laterality Date   BREAST BIOPSY Right  05/20/2019   Affirm Bx- X- clip, neg   BREAST BIOPSY Left 10/02/2022   [place] Bx, path pending   BREAST BIOPSY Left 10/02/2022   [place] Bx Node- path pending   BREAST BIOPSY Left 10/02/2022   [place] LT BREAST BX W LOC DEV 1ST LESION IMG BX SPEC US GUIDE 10/02/2022 [facility]   CESAREAN SECTION     LAPAROSCOPIC BILATERAL SALPINGECTOMY Bilateral 02/09/2022   Procedure: LAPAROSCOPIC BILATERAL SALPINGECTOMY;  Surgeon: Hildred Laser, MD;  Location: [facility];  Service: Gynecology;  Laterality: Bilateral;   PORTACATH PLACEMENT Right 10/17/2022   Procedure: INSERTION PORT-A-CATH;   Surgeon: Henrene Dodge, MD;  Location: [facility];  Service: General;  Laterality: Right;   PORTACATH PLACEMENT Left 02/10/2023   Procedure: INSERTION PORT-A-CATH;  Surgeon: Sung Amabile, DO;  Location: [facility];  Service: General;  Laterality: Left;   TEE WITHOUT CARDIOVERSION N/A 01/02/2023   Procedure: TRANSESOPHAGEAL ECHOCARDIOGRAM (TEE);  Surgeon: Antonieta Iba, MD;  Location: [facility];  Service: Cardiovascular;  Laterality: N/A;   TUBAL LIGATION      SOCIAL HISTORY: Social History   Socioeconomic History   Marital status: Single    Spouse name: Not on file   Number of children: 2   Years of education: Not on file   Highest education level: High school graduate  Occupational History   Not on file  Tobacco Use   Smoking status: Never    Passive exposure: Never   Smokeless tobacco: Never  Vaping Use   Vaping status: Never Used  Substance and Sexual Activity   Alcohol use: Yes    Comment: occ   Drug use: No   Sexual activity: Yes    Birth control/protection: Surgical  Other Topics Concern   Not on file  Social History Narrative   Not on file   Social Drivers of Health   Financial Resource Strain: Low Risk  (04/16/2023)   Received [facility]   Overall Financial Resource Strain (CARDIA)    Difficulty of Paying Living Expenses: Not hard at all  Food Insecurity: No Food Insecurity (04/16/2023)   Received from [facility]   Hunger Vital Sign    Worried About Running Out of Food in the Last Year: Never true    Ran Out of Food in the Last Year: Never true  Transportation Needs: No Transportation Needs (04/16/2023)   Received from [facility] - Transportation    In the past 12 months, has lack of transportation kept you from medical appointments or from getting medications?: No    Lack of Transportation (Non-Medical): No  Physical Activity: Not on file  Stress: No Stress Concern Present (10/10/2022)    [employer] of Occupational [employer] - Occupational Stress Questionnaire    Feeling of Stress : Only a little  Social Connections: Not on file  Intimate Partner Violence: Not At Risk (12/31/2022)   Humiliation, Afraid, Rape, and Kick questionnaire    Fear of Current or Ex-Partner: No    Emotionally Abused: No    Physically Abused: No    Sexually Abused: No    FAMILY HISTORY: Family History  Problem Relation Age of Onset   Breast cancer Mother 14       no GT   Breast cancer Maternal Aunt 33   Breast cancer Maternal Aunt    Lung cancer Maternal Uncle    Lung cancer Maternal Uncle    Bone cancer Paternal Aunt    Pancreatic cancer  Maternal Grandmother 90   Lung cancer Maternal Grandfather 2   Breast cancer Cousin 40   Breast cancer Other 33       gene pos?    ALLERGIES:  has no known allergies.  MEDICATIONS:  Current Outpatient Medications  Medication Sig Dispense Refill   acetaminophen (TYLENOL) 500 MG tablet Take 2 tablets (1,000 mg total) by mouth every 6 (six) hours as needed for mild pain.     apixaban (ELIQUIS) 5 MG TABS tablet Take 1 tablet (5 mg total) by mouth 2 (two) times daily. Start AFTER completing your starter pack 60 tablet 1   HYDROcodone-acetaminophen (NORCO) 5-325 MG tablet Take 1 tablet by mouth every 6 (six) hours as needed for up to 6 doses for moderate pain (pain score 4-6). 6 tablet 0   lidocaine-prilocaine (EMLA) cream Apply 1 Application topically as needed. 30 g 11   senna (SENOKOT) 8.6 MG TABS tablet Take 2 tablets (17.2 mg total) by mouth daily. 120 tablet 2   simethicone (MYLICON) 80 MG chewable tablet Chew 1 tablet (80 mg total) by mouth every 6 (six) hours as needed for flatulence. 30 tablet 2   No current facility-administered medications for this visit.   Facility-Administered Medications Ordered in Other Visits  Medication Dose Route Frequency Provider Last Rate Last Admin   cyclophosphamide (CYTOXAN) 1,000 mg in sodium chloride 0.9 %  250 mL chemo infusion  600 mg/m2 (Treatment Plan Recorded) Intravenous Once [name], MD       DOXOrubicin (ADRIAMYCIN) chemo injection 96 mg  60 mg/m2 (Treatment Plan Recorded) Intravenous Once [name], MD       pembrolizumab [facility]) 200 mg in sodium chloride 0.9 % 50 mL chemo infusion  200 mg Intravenous Once [name], MD        Review of Systems  Constitutional:  Negative for appetite change, chills, fatigue and fever.  HENT:   Negative for hearing loss and voice change.   Eyes:  Negative for eye problems.  Respiratory:  Negative for chest tightness and cough.   Cardiovascular:  Negative for chest pain.  Gastrointestinal:  Negative for abdominal distention, abdominal pain and blood in stool.  Endocrine: Negative for hot flashes.  Genitourinary:  Positive for menstrual problem. Negative for difficulty urinating and frequency.   Musculoskeletal:  Negative for arthralgias.  Skin:  Negative for itching and rash.  Neurological:  Negative for extremity weakness and headaches.  Hematological:  Negative for adenopathy.  Psychiatric/Behavioral:  Negative for confusion.      PHYSICAL EXAMINATION: ECOG PERFORMANCE STATUS: 0 - Asymptomatic  Vitals:   05/16/23 0838  BP: 113/64  Pulse: 68  Resp: 18  Temp: (!) 97.5 F (36.4 C)   Filed Weights   05/16/23 0838  Weight: 153 lb 3.2 oz (69.5 kg)    Physical Exam Constitutional:      General: She is not in acute distress.    Appearance: She is not diaphoretic.  HENT:     Head: Normocephalic and atraumatic.  Eyes:     General: No scleral icterus. Cardiovascular:     Rate and Rhythm: Normal rate and regular rhythm.  Pulmonary:     Effort: Pulmonary effort is normal. No respiratory distress.     Breath sounds: No wheezing.  Abdominal:     General: Bowel sounds are normal. There is no distension.     Palpations: Abdomen is soft.  Musculoskeletal:        General: Normal range of motion.  Cervical back: Normal range of motion  and neck supple.  Skin:    General: Skin is warm and dry.     Findings: No erythema.     Comments: Left anterior chest wall medi port  Neurological:     Mental Status: She is alert and oriented to person, place, and time. Mental status is at baseline.     Motor: No abnormal muscle tone.  Psychiatric:        Mood and Affect: Mood and affect normal.     LABORATORY DATA:  I have reviewed the data as listed    Latest Ref Rng & Units 05/16/2023    8:14 AM 04/25/2023    8:14 AM 04/04/2023    9:26 PM  CBC  WBC 4.0 - 10.5 K/uL 7.2  5.6  10.3   Hemoglobin 12.0 - 15.0 g/dL 16.1  09.6  04.5   Hematocrit 36.0 - 46.0 % 36.5  36.3  34.6   Platelets 150 - 400 K/uL 187  160  248       Latest Ref Rng & Units 05/16/2023    8:14 AM 04/25/2023    8:14 AM 04/04/2023    9:26 PM  CMP  Glucose 70 - 99 mg/dL 409  811  914   BUN 6 - 20 mg/dL 12  18  11    Creatinine 0.44 - 1.00 mg/dL 7.82  9.56  2.13   Sodium 135 - 145 mmol/L 138  136  137   Potassium 3.5 - 5.1 mmol/L 3.7  3.7  4.0   Chloride 98 - 111 mmol/L 107  105  105   CO2 22 - 32 mmol/L 23  23  21    Calcium 8.9 - 10.3 mg/dL 9.0  8.9  9.5   Total Protein 6.5 - 8.1 g/dL 7.5  7.2  7.4   Total Bilirubin 0.0 - 1.2 mg/dL 0.4  0.4  0.5   Alkaline Phos 38 - 126 U/L 95  81  80   AST 15 - 41 U/L 21  27  29    ALT 0 - 44 U/L 26  32  36      RADIOGRAPHIC STUDIES: I have personally reviewed the radiological images as listed and agreed with the findings in the report. No results found.

## 2023-05-16 NOTE — Progress Notes (Signed)
Pt here for follow up. No new concerns.

## 2023-05-16 NOTE — Telephone Encounter (Signed)
 Spoke to patient and informed her that per Dr.Yu she may hold Eliquis 2 days prior to procedure and resume the day after procedure.

## 2023-05-16 NOTE — Assessment & Plan Note (Signed)
 Likely provoked, continue Eliquis 5mg  BID, plan 6 months.  She has been on anticoagulation for 4+ months  Ok to hold of Eliquis for 2 days prior to surgery and resume afterwards.

## 2023-05-16 NOTE — Patient Instructions (Signed)

## 2023-05-16 NOTE — Assessment & Plan Note (Signed)
Once she finished treatment of breast cancer, recommend risk-reducing salpingo oophorectomy  pancreatic screening at age 38 (or 71 years younger than the earliest exocrine pancreatic cancer diagnosis in the family, whichever is earlier)  Family members are encouraged to consider genetic testing for this familial pathogenic variant.

## 2023-05-19 ENCOUNTER — Ambulatory Visit: Payer: Self-pay | Admitting: Surgery

## 2023-05-19 ENCOUNTER — Inpatient Hospital Stay

## 2023-05-19 DIAGNOSIS — C50919 Malignant neoplasm of unspecified site of unspecified female breast: Secondary | ICD-10-CM

## 2023-05-19 DIAGNOSIS — Z5112 Encounter for antineoplastic immunotherapy: Secondary | ICD-10-CM | POA: Diagnosis not present

## 2023-05-19 MED ORDER — PEGFILGRASTIM-CBQV 6 MG/0.6ML ~~LOC~~ SOSY
6.0000 mg | PREFILLED_SYRINGE | Freq: Once | SUBCUTANEOUS | Status: AC
Start: 2023-05-19 — End: 2023-05-19
  Administered 2023-05-19: 6 mg via SUBCUTANEOUS
  Filled 2023-05-19: qty 0.6

## 2023-05-19 NOTE — H&P (Addendum)
 Subjective:    CC: Breast cancer, BRCA2 positive, left (CMS/HHS-HCC) [C50.912, Z15.01]   HPI:   Subjective Penny Gomez is a 38 y.o. female who returns for evaluation of above. Finished chemo last week, ready to discuss bilateral mastectomy for above.   Triple negative breast CA.  Most recent MRI with resolution as noted below.   Past Medical History:  has a past medical history of Breast cancer (CMS/HHS-HCC).   Past Surgical History:  has a past surgical history that includes Cesarean section and port-a-cath placement (02/10/2023).   Family History: family history includes Alcohol abuse in her father; Breast cancer in her maternal aunt and mother; Diabetes in her father, maternal aunt, and mother; High blood pressure (Hypertension) in her brother, maternal aunt, and mother.   Social History:  reports that she has never smoked. She has never used smokeless tobacco. She reports that she does not currently use alcohol. No history on file for drug use.   Current Medications: has a current medication list which includes the following prescription(s): apixaban and sennosides.   Allergies:     Allergies as of 05/19/2023   (No Known Allergies)      ROS:  A 15 point review of systems was performed and was negative except as noted in HPI   Objective:      Objective BP 97/61   Pulse 88   Ht 160 cm (5\' 3" )   Wt 69.4 kg (153 lb)   LMP 05/09/2023   BMI 27.10 kg/m    Constitutional :  No distress, cooperative, alert  Lymphatics/Throat:  Supple with no lymphadenopathy  Respiratory:  Clear to auscultation bilaterally  Cardiovascular:  Regular rate and rhythm  Gastrointestinal: Soft, non-tender, non-distended, no organomegaly.  Musculoskeletal: Steady gait and movement  Skin: Cool and moist  Psychiatric: Normal affect, non-agitated, not confused           LABS:  N/a    RADS: CLINICAL DATA:  38 year old female with biopsy proven triple  negative left breast cancer. Assess  response to neoadjuvant  chemotherapy. Patient desires mastectomy. Previous MRI dated  10/17/2022 demonstrated 6.7 cm of abnormal enhancement in the  lower-inner quadrant of the left breast.   EXAM:  BILATERAL BREAST MRI WITH AND WITHOUT CONTRAST   TECHNIQUE:  Multiplanar, multisequence MR images of both breasts were obtained  prior to and following the intravenous administration of 6 ml of  Gadavist   Three-dimensional MR images were rendered by post-processing of the  original MR data on an independent workstation. The  three-dimensional MR images were interpreted, and findings are  reported in the following complete MRI report for this study. Three  dimensional images were evaluated at the independent interpreting  workstation using the DynaCAD thin client.   COMPARISON:  Previous exam(s) including MRI dated 10/17/2022.   FINDINGS:  Breast composition: c. Heterogeneous fibroglandular tissue.   Background parenchymal enhancement: Mild   Right breast: No mass or abnormal enhancement.   Left breast: There is a signal void artifact in the lower-inner  quadrant of the left breast from the biopsy clip. There is no mass  or abnormal enhancement. Previously seen enhancement spanning 6.7 cm  has resolved.   Lymph nodes: No abnormal appearing lymph nodes.   Ancillary findings:  None.   IMPRESSION:  No abnormal enhancement in the left breast consistent with response  to neoadjuvant chemotherapy.   RECOMMENDATION:  Continued treatment planning of the known triple negative left  breast cancer is recommended.   BI-RADS  CATEGORY  6: Known biopsy-proven malignancy.    Electronically Signed    By: Baird Lyons M.D.    On: 03/10/2023 10:37    Assessment:    Breast cancer, BRCA2 positive, left (CMS/HHS-HCC) [C50.912, Z15.01]   Plan:      Plan 1. Breast cancer, BRCA2 positive, left (CMS/HHS-HCC) [C50.912, Z15.01]  Discussed the risk of surgery including recurrence, chronic  pain, post-op infxn, poor/delayed wound healing, poor cosmesis, seroma, hematoma formation, and possible re-operation to address said risks. The risks of general anesthetic, if used, includes MI, CVA, sudden death or even reaction to anesthetic medications also discussed.  Typical post-op recovery time and possbility of activity restrictions were also discussed.  Alternatives include continued observation.  Benefits include possible symptom relief, pathologic evaluation, and/or curative excision.    The patient verbalized understanding and all questions were answered to the patient's satisfaction.   2. Patient has elected to proceed with surgical treatment. Procedure will be scheduled after discussion with plastics.  Bilateral mastectomy and left SLNB from my standpoint.  Will need 24hr observation post op. Also hold eliquis 2days prior    labs/images/medications/previous chart entries reviewed personally and relevant changes/updates noted above.

## 2023-05-21 ENCOUNTER — Other Ambulatory Visit: Payer: Self-pay

## 2023-05-23 ENCOUNTER — Other Ambulatory Visit: Payer: Self-pay | Admitting: Oncology

## 2023-05-26 ENCOUNTER — Encounter: Payer: Self-pay | Admitting: Plastic Surgery

## 2023-05-26 ENCOUNTER — Ambulatory Visit: Admitting: Plastic Surgery

## 2023-05-26 VITALS — BP 121/83 | HR 96 | Ht 63.0 in | Wt 148.8 lb

## 2023-05-26 DIAGNOSIS — I2699 Other pulmonary embolism without acute cor pulmonale: Secondary | ICD-10-CM | POA: Diagnosis not present

## 2023-05-26 DIAGNOSIS — C50919 Malignant neoplasm of unspecified site of unspecified female breast: Secondary | ICD-10-CM

## 2023-05-26 DIAGNOSIS — Z1509 Genetic susceptibility to other malignant neoplasm: Secondary | ICD-10-CM

## 2023-05-26 DIAGNOSIS — D696 Thrombocytopenia, unspecified: Secondary | ICD-10-CM

## 2023-05-26 DIAGNOSIS — Z1501 Genetic susceptibility to malignant neoplasm of breast: Secondary | ICD-10-CM

## 2023-05-26 DIAGNOSIS — Z803 Family history of malignant neoplasm of breast: Secondary | ICD-10-CM

## 2023-05-26 NOTE — Progress Notes (Signed)
 Patient ID: Penny Gomez, female    DOB: 05/21/1985, 38 y.o.   MRN: [id number]   Chief Complaint  Patient presents with   Consult         The patient is a 38 year old female here for a consultation for breast reconstruction.  She finished chemotherapy about 1 week ago.  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  Her past surgical history includes a C-section, tubal ligation and a Port-A-Cath placement.  She did not have any trouble with that she is not a smoker and is 5 feet 3 inches tall and weighs 153 pounds.  She is around a 36C bra size.  She is okay going smaller.  She has medical conditions which include pulmonary embolism, anticoagulation, endocarditis, fibroids, anemia, pneumonia, thrombocytopenia and triple negative breast cancer.  She had a DVT probably related to the chemotherapy and was placed on Eliquis.  The patient was accompanied by her mom who also had breast cancer.    Review of Systems  Constitutional: Negative.   Eyes: Negative.   Respiratory: Negative.    Cardiovascular: Negative.   Gastrointestinal: Negative.   Endocrine: Negative.   Genitourinary: Negative.   Musculoskeletal: Negative.   Skin: Negative.     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   Vascular port complication     Past Surgical History:  Procedure Laterality Date   BREAST BIOPSY Right 05/20/2019   Affirm Bx- X- clip, neg   BREAST BIOPSY Left 10/02/2022   [place] Bx, path pending   BREAST BIOPSY Left 10/02/2022   [place] Bx Node- path pending    BREAST BIOPSY Left 10/02/2022   [place] LT BREAST BX W LOC DEV 1ST LESION IMG BX SPEC US GUIDE 10/02/2022 [facility]   CESAREAN SECTION     LAPAROSCOPIC BILATERAL SALPINGECTOMY Bilateral 02/09/2022   Procedure: LAPAROSCOPIC BILATERAL SALPINGECTOMY;  Surgeon: Hildred Laser, MD;  Location: [facility];  Service: Gynecology;  Laterality: Bilateral;   PORTACATH PLACEMENT Right 10/17/2022   Procedure: INSERTION PORT-A-CATH;  Surgeon: Henrene Dodge, MD;  Location: [facility];  Service: General;  Laterality: Right;   PORTACATH PLACEMENT Left 02/10/2023   Procedure: INSERTION PORT-A-CATH;  Surgeon: Sung Amabile, DO;  Location: [facility];  Service: General;  Laterality: Left;   TEE WITHOUT CARDIOVERSION N/A 01/02/2023   Procedure: TRANSESOPHAGEAL ECHOCARDIOGRAM (TEE);  Surgeon: Antonieta Iba, MD;  Location: [facility];  Service: Cardiovascular;  Laterality: N/A;   TUBAL LIGATION        Current Outpatient Medications:    acetaminophen (TYLENOL) 500 MG tablet, Take 2 tablets (1,000 mg total) by mouth every 6 (six) hours as needed for mild pain., Disp: , Rfl:    ELIQUIS 5 MG TABS tablet, Take 1 tablet (5 mg total) by mouth 2 (two) times daily. Start AFTER completing your starter pack, Disp: 60 tablet, Rfl: 0   HYDROcodone-acetaminophen (NORCO) 5-325 MG tablet, Take 1 tablet by mouth every  6 (six) hours as needed for up to 6 doses for moderate pain (pain score 4-6)., Disp: 6 tablet, Rfl: 0   lidocaine-prilocaine (EMLA) cream, Apply 1 Application topically as needed., Disp: 30 g, Rfl: 11   senna (SENOKOT) 8.6 MG TABS tablet, Take 2 tablets (17.2 mg total) by mouth daily., Disp: 120 tablet, Rfl: 2   simethicone (MYLICON) 80 MG chewable tablet, Chew 1 tablet (80 mg total) by mouth every 6 (six) hours as needed for flatulence., Disp: 30 tablet, Rfl: 2   Objective:   Vitals:   05/26/23 1157  BP: 121/83  Pulse: 96  SpO2: 97%    Physical Exam Vitals and nursing note reviewed.  Constitutional:       Appearance: Normal appearance.  HENT:     Head: Atraumatic.  Cardiovascular:     Rate and Rhythm: Normal rate.     Pulses: Normal pulses.  Pulmonary:     Effort: Pulmonary effort is normal.  Abdominal:     Palpations: Abdomen is soft.  Musculoskeletal:        General: No swelling or tenderness.  Skin:    General: Skin is warm.     Capillary Refill: Capillary refill takes less than 2 seconds.  Neurological:     Mental Status: She is alert and oriented to person, place, and time.  Psychiatric:        Mood and Affect: Mood normal.        Behavior: Behavior normal.        Thought Content: Thought content normal.        Judgment: Judgment normal.     Assessment & Plan:  Pulmonary infarct (HCC)  Thrombocytopenia (HCC)  Invasive carcinoma of breast (HCC)  BRCA1 gene mutation positive  The options for reconstruction we explained to the patient / family for breast reconstruction.  There are two general categories of reconstruction.  We can reconstruction a breast with implants or use the patient's own tissue.  These were further discussed as listed.  Breast reconstruction is an optional procedure and eligibility depends on the full spectrum of the health of the patient and any co-morbidities.  More than one surgery is often needed to complete the reconstruction process.  The process can take three to twelve months to complete.  The breasts will not be identical due to many factors such as rib differences, shoulder asymmetry and treatments such as radiation.  The goal is to get the breasts to look normal and symmetrical in clothes.  Scars are a part of surgery and may fade some in time but will always be present under clothes.  Surgery may be an option on the non-cancer breast to achieve more symmetry.  No matter which procedure is chosen there is always the risk of complications and even failure of the body to heal.  This could result in no breast.    The options for reconstruction  include:  1. Placement of a tissue expander with Acellular dermal matrix. When the expander is the desired size surgery is performed to remove the expander and place an implant.  In some cases the implant can be placed without an expander.  2. Autologous reconstruction can include using a muscle or tissue from another area of the body to create a breast.  3. Combined procedures (ie. latissismus dorsi flap) can be done with an expander / implant placed under the muscle.   The risks, benefits, scars and recovery time were discussed for each of the above. Risks include  bleeding, infection, hematoma, seroma, scarring, pain, wound healing complications, flap loss, fat necrosis, capsular contracture, need for implant removal, donor site complications, bulge, hernia, umbilical necrosis, need for urgent reoperation, and need for dressing changes.   The procedure the patient selected / that was best for the patient, was then discussed in further detail.  Total time: 45 minutes. This includes time spent with the patient during the visit as well as time spent before and after the visit reviewing the chart, documenting the encounter, making phone calls and reviewing studies.   The patient is interested in a Diep flap.  She would like to talk with Dr. Aloma Arrant so we will get that set up.  If it is not possible then she will go with the implants and we can do that at the same time as the bilateral mastectomies.  Pictures were obtained of the patient and placed in the chart with the patient's or guardian's permission.   Lindaann Requena Darrick Greenlaw, DO

## 2023-05-26 NOTE — Addendum Note (Signed)
 Addended by: Everette Hives on: 05/26/2023 01:36 PM   Modules accepted: Orders

## 2023-05-28 ENCOUNTER — Encounter: Payer: Self-pay | Admitting: Occupational Therapy

## 2023-05-28 ENCOUNTER — Ambulatory Visit: Attending: Oncology | Admitting: Occupational Therapy

## 2023-05-28 DIAGNOSIS — R293 Abnormal posture: Secondary | ICD-10-CM | POA: Insufficient documentation

## 2023-05-28 NOTE — Therapy (Signed)
 OUTPATIENT OCCUPATIONAL THERAPY BREAST CANCER PREOP CONSULT   Patient Name: Penny Gomez MRN: [id number] DOB:06-Nov-1985, 38 y.o., female Today's Date: 05/28/2023  END OF SESSION:  OT End of Session - 05/28/23 1751     Visit Number 2    Number of Visits 10    Date for OT Re-Evaluation 08/20/23    OT Start Time 0932    OT Stop Time 1000    OT Time Calculation (min) 28 min    Activity Tolerance Patient tolerated treatment well    Behavior During Therapy WFL for tasks assessed/performed             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   Vascular port complication    Past Surgical History:  Procedure Laterality Date   BREAST BIOPSY Right 05/20/2019   Affirm Bx- X- clip, neg   BREAST BIOPSY Left 10/02/2022   [place] Bx, path pending   BREAST BIOPSY Left 10/02/2022   [place] Bx Node- path pending   BREAST BIOPSY Left 10/02/2022   [place] LT BREAST BX W LOC DEV 1ST LESION IMG BX SPEC US GUIDE 10/02/2022 [facility]   CESAREAN SECTION     LAPAROSCOPIC BILATERAL SALPINGECTOMY Bilateral 02/09/2022   Procedure: LAPAROSCOPIC BILATERAL SALPINGECTOMY;  Surgeon: Hildred Laser, MD;  Location: [facility];  Service: Gynecology;  Laterality: Bilateral;   PORTACATH PLACEMENT Right 10/17/2022   Procedure: INSERTION PORT-A-CATH;  Surgeon: Henrene Dodge, MD;  Location: [facility];  Service: General;  Laterality: Right;   PORTACATH PLACEMENT Left 02/10/2023   Procedure: INSERTION PORT-A-CATH;  Surgeon: Sung Amabile, DO;  Location: [facility];  Service: General;  Laterality: Left;    TEE WITHOUT CARDIOVERSION N/A 01/02/2023   Procedure: TRANSESOPHAGEAL ECHOCARDIOGRAM (TEE);  Surgeon: Antonieta Iba, MD;  Location: [facility];  Service: Cardiovascular;  Laterality: N/A;   TUBAL LIGATION     Patient Active Problem List   Diagnosis Date Noted   Palpitation 04/25/2023   Bloodstream infection due to Port-A-Cath 01/02/2023   Endocarditis 01/02/2023   Thrombocytopenia (HCC) 01/01/2023   Anemia 01/01/2023   Bacteremia 12/31/2022   Sepsis (HCC) 12/31/2022   Acute febrile illness 12/30/2022   Hypokalemia 12/06/2022   Acute pulmonary embolism (HCC) 11/24/2022   Vascular port complication 11/24/2022   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) 11/24/2022   Multifocal pneumonia 11/24/2022   Pulmonary infarct (HCC) 11/24/2022   Genetic testing 11/11/2022   BRCA1 gene mutation positive 11/11/2022   Encounter for antineoplastic chemotherapy 10/25/2022   Port-A-Cath in place 10/25/2022   Invasive carcinoma of breast (HCC) 10/10/2022   Goals of care, counseling/discussion 10/10/2022   Family history of cancer 10/10/2022   IDA (iron deficiency anemia) 10/10/2022   Urinary frequency 03/19/2022    PCP: Jesse Sans PA  REFERRING PROVIDER: Dr Obie Dredge DIAG: cT1cN0  THERAPY DIAG:  Abnormal posture  Rationale for Evaluation and Treatment: Rehabilitation  ONSET DATE: 09/23/22  SUBJECTIVE:  SUBJECTIVE STATEMENT: Patient reports she is here today after being refer by one of her medical team for her newly diagnosed left breast cancer.   PERTINENT HISTORY:  Patient was diagnosed with left  breast cancer -has finished chemo and had consultation for bilateral mastectomy.  But do not know yet yet about plane for reconstruction.  Was referred to [facility].  Await appointment.     PATIENT GOALS:   reduce lymphedema risk and learn post op HEP.   PAIN:  Are you having pain? No  PRECAUTIONS: Active CA      HAND DOMINANCE: right  WEIGHT BEARING RESTRICTIONS: No  FALLS:  Has patient fallen in last 6 months? No  LIVING ENVIRONMENT: Patient lives with: family  OCCUPATION: Works at [employer] in [employer].  Just started yesterday there.  Has a 71 and a 36 year old likes to do things around the house.     OBJECTIVE:  COGNITION: Overall cognitive status: Within functional limits for tasks assessed    POSTURE:  Forward head and rounded shoulders posture  UPPER EXTREMITY AROM/PROM:   Bill Shoulder active range of motion within normal limits   CERVICAL AROM: All within normal limits:      UPPER EXTREMITY STRENGTH: 5/5 for bilateral upper extremity at all joints  LYMPHEDEMA ASSESSMENTS:   LANDMARK RIGHT   eval  15 cm proximal to olecranon process 27.5  Olecranon process 23  15 cm proximal to ulnar styloid process 22.3  Just proximal to ulnar styloid process 13.8  Across hand at thumb web space   At base of 2nd digit   (Blank rows = not tested)  LANDMARK LEFT   eval  15 cm proximal to olecranon process 26.4  Olecranon process 22.5  15 cm proximal to ulnar styloid process 22  Just proximal to ulnar styloid process 13.5  Across hand at thumb web space   At base of 2nd digit   (Blank rows = not tested)  L-DEX LYMPHEDEMA SCREENING:  L- dex score 4.3 prior to chemo Risk limb L and R hand dominant   The patient was assessed using the L-Dex machine today to produce a lymphedema index baseline score. The patient will be reassessed on a regular basis (typically every 3 months) to obtain new L-Dex scores. If the score is > 6.5 points away from his/her baseline score indicating onset of subclinical lymphedema, it will be recommended to wear a compression garment for 4 weeks, 12 hours per day and then be reassessed. If the  score continues to be > 6.5 points from baseline at reassessment, we will initiate lymphedema treatment. Assessing in this manner has a 95% rate of preventing clinically significant lymphedema.   L-DEX FLOWSHEETS - 05/28/23 1700       L-DEX LYMPHEDEMA SCREENING   Measurement Type Unilateral    L-DEX MEASUREMENT EXTREMITY Upper Extremity    POSITION  Standing    DOMINANT SIDE Right    At Risk Side Left    BASELINE SCORE (UNILATERAL) -5            Reassess again patient's active range of motion this date after chemo.  Appear to be within normal limits. Reviewed handout provided for signs and symptoms of lymphedema as well as prevention. Reviewed with patient also home exercises for active assisted range of motion for shoulder flexion abduction external rotation starting below 90 degrees depending on surgeons precautions and instructions as well as when to proceed overhead. Reinforced with patient because having reconstruction  to discuss with plastic surgeon precautions as well as progression of range of motion and activities after surgery.  PATIENT EDUCATION:  Education details: Lymphedema risk reduction and post op shoulder/posture HEP Person educated: Patient Education method: Explanation, Demonstration, Handout Education comprehension: Patient verbalized understanding and returned demonstration    ASSESSMENT:  CLINICAL IMPRESSION: Pt's multidisciplinary medical team has met to assess and determine a recommended treatment plan. She had chemotherapy and had appointment with Dr. Rosea Conch about bilateral mastectomy but awaiting appointments for reconstruction with plastic surgeon at [facility].  Reviewed with patient some questions to ask surgeon about precautions and progression after surgery for range of motion and activities.  Especially for her to being young and working.  Also repeat patient's L-Dex score.  She will benefit from a post op OT reassessment to determine needs and from L-Dex  screens every 3 months for 2 years to detect subclinical lymphedema.  Pt will benefit from skilled therapeutic intervention to improve on the following deficits: Decreased knowledge of precautions and lymphedema education, impaired UE functional use, pain, decreased ROM, postural dysfunction.   OT treatment/interventions: ADL/self-care home management, pt/family education, therapeutic exercise,manual therapy  REHAB POTENTIAL: Good  CLINICAL DECISION MAKING: Stable/uncomplicated  EVALUATION COMPLEXITY: Low   GOALS: Goals reviewed with patient? YES  LONG TERM GOALS: (STG=LTG)    Name Target Date Goal status  1 Pt will be able to verbalize understanding of pertinent lymphedema risk reduction practices relevant to her dx specifically related to skin care.  Baseline:  No knowledge 07/21/23 Continue weight plan of care   2 Pt will be able to return demo and/or verbalize understanding of the post op HEP related to regaining shoulder ROM. Baseline:  No knowledge 07/21/23 Continues await plan of care        4 Pt will demo she has regained full shoulder ROM and function post operatively compared to baselines.  Baseline: See objective measurements taken today. 07/21/23 Continue    PLAN:  OT FREQUENCY/DURATION: 6 visits in 12 weeks  PLAN FOR NEXT SESSION: will reassess 3-4 weeks post op to determine needs.   Patient will follow up at outpatient cancer rehab 3-4 weeks following surgery.  If the patient requires occupational therapy at that time, a specific plan will be dictated and sent to the referring physician for approval. The patient was educated today on appropriate basic range of motion exercises to begin post operatively   She/he verbalized good understanding.    Occupational Therapy Information for After Breast Cancer Surgery/Treatment:  Lymphedema is a swelling condition that you may be at risk for in your arm if you have lymph nodes removed from the armpit area.  After a sentinel node  biopsy, the risk is approximately 5-9% and is higher after an axillary node dissection.  There is treatment available for this condition and it is not life-threatening.  Contact your physician or occupational therapist with concerns. You may begin the 4 shoulder/posture exercises (see additional sheet) when permitted by your physician (typically a week after surgery).  If you have drains, you may need to wait until those are removed before beginning range of motion exercises.  A general recommendation is to not lift your arms above shoulder height until drains are removed.  These exercises should be done to your tolerance and gently.  This is not a "no pain/no gain" type of recovery so listen to your body and stretch into the range of motion that you can tolerate, stopping if you have pain.  If you  are having immediate reconstruction, ask your plastic surgeon about doing exercises as he or she may want you to wait. We encourage you to watch the ABC (After Breast Cancer)  video. You will learn information related to lymphedema risk, prevention and treatment and additional exercises to regain mobility following surgery.  While undergoing any medical procedure or treatment, try to avoid blood pressure being taken or needle sticks from occurring on the arm on the side of cancer.   This recommendation begins after surgery and continues for the rest of your life.  This may help reduce your risk of getting lymphedema (swelling in your arm). An excellent resource for those seeking information on lymphedema is the National Lymphedema Network's web site. It can be accessed at www.lymphnet.org If you notice swelling in your hand, arm or breast at any time following surgery (even if it is many years from now), please contact your doctor or occupational therapist to discuss this.  Lymphedema can be treated at any time but it is easier for you if it is treated early on.  If you feel like your shoulder motion is not returning  to normal in a reasonable amount of time, please contact your surgeon or occupational therapist.  [facility] [phone]. [address], [place], [place] [address]      Heloise Lobo, OTR/L,CLT 05/28/2023, 5:52 PM

## 2023-05-29 ENCOUNTER — Telehealth: Payer: Self-pay | Admitting: *Deleted

## 2023-05-29 NOTE — Telephone Encounter (Signed)
 Faxed patient demographics,insurance information,and recent office notes to Dr. Ofilia Benton office for an appointment for the patient.  Confirmation received and copy scanned into the chart.//AB/CMA

## 2023-06-16 ENCOUNTER — Telehealth: Payer: Self-pay | Admitting: *Deleted

## 2023-06-16 ENCOUNTER — Encounter: Payer: Self-pay | Admitting: Oncology

## 2023-06-18 ENCOUNTER — Encounter: Payer: Self-pay | Admitting: Oncology

## 2023-06-18 NOTE — Telephone Encounter (Signed)
 Form completed to match dates of existing FMLA.  Also request the disability company request additional info from surgeon if needed.    Pending MD signature

## 2023-06-18 NOTE — Telephone Encounter (Signed)
 Completed form faxed.

## 2023-06-19 ENCOUNTER — Other Ambulatory Visit: Payer: Self-pay | Admitting: Surgery

## 2023-06-19 DIAGNOSIS — Z1501 Genetic susceptibility to malignant neoplasm of breast: Secondary | ICD-10-CM

## 2023-06-19 DIAGNOSIS — C50912 Malignant neoplasm of unspecified site of left female breast: Secondary | ICD-10-CM

## 2023-06-24 ENCOUNTER — Encounter: Payer: Self-pay | Admitting: *Deleted

## 2023-06-24 ENCOUNTER — Other Ambulatory Visit: Payer: Self-pay | Admitting: Oncology

## 2023-06-24 ENCOUNTER — Encounter: Payer: Self-pay | Admitting: Oncology

## 2023-06-24 NOTE — Progress Notes (Signed)
 Mastectomy is scheduled for 6/12.   She will see Dr. Wilhelmenia Harada back on 6/27.  VM left with appt. Details.

## 2023-06-25 ENCOUNTER — Other Ambulatory Visit: Payer: Self-pay

## 2023-06-25 ENCOUNTER — Encounter: Payer: Self-pay | Admitting: Oncology

## 2023-06-25 MED ORDER — APIXABAN 5 MG PO TABS: 5.0000 mg | ORAL_TABLET | Freq: Two times a day (BID) | ORAL | 0 refills | Status: DC

## 2023-07-08 ENCOUNTER — Encounter: Payer: Self-pay | Admitting: Plastic Surgery

## 2023-07-08 ENCOUNTER — Ambulatory Visit (INDEPENDENT_AMBULATORY_CARE_PROVIDER_SITE_OTHER): Admitting: Plastic Surgery

## 2023-07-08 VITALS — BP 113/70 | HR 60

## 2023-07-08 DIAGNOSIS — C50919 Malignant neoplasm of unspecified site of unspecified female breast: Secondary | ICD-10-CM | POA: Diagnosis not present

## 2023-07-08 NOTE — Progress Notes (Signed)
   Subjective:    Patient ID: Penny Gomez, female    DOB: [birth year], 38 y.o.   MRN: [id number]  The patient is a 38 year old female here for further discussion about breast reconstruction.  She finished chemotherapy in April.  She is being treated for triple negative breast cancer and BRCA2 positive gene.  Her past surgical history includes C-section, tubal ligation and a Port-A-Cath placement.  She is not a smoker and is 5 feet 3 inches tall and weighs around 153 pounds.  She has C cup size p.o. and is okay going a little smaller.  Her past medical conditions include a pulmonary embolism, anticoagulation, endocarditis, fibroids, anemia, pneumonia and thrombocytopenia.  She had the DVT while on chemotherapy and was placed on Eliquis .  She went for a consult at [facility] and saw Dr. Aloma Arrant.  He recommended that she have expanders placed and then have the Diep flap done several months later.  This is what the patient is hoping to do.      Review of Systems  Constitutional: Negative.   HENT: Negative.    Eyes: Negative.   Respiratory: Negative.    Cardiovascular: Negative.   Gastrointestinal: Negative.   Endocrine: Negative.   Genitourinary: Negative.        Objective:   Physical Exam HENT:     Head: Atraumatic.  Pulmonary:     Effort: Pulmonary effort is normal.  Skin:    General: Skin is warm.     Capillary Refill: Capillary refill takes less than 2 seconds.  Neurological:     Mental Status: She is alert and oriented to person, place, and time.  Psychiatric:        Mood and Affect: Mood normal.        Behavior: Behavior normal.        Thought Content: Thought content normal.        Judgment: Judgment normal.         Assessment & Plan:     ICD-10-CM   1. Invasive carcinoma of breast (HCC)  C50.919       The patient wants to move forward with immediate reconstruction using the expanders.  She may decide to do the Diep flap afterwards which would be around about 6 months  later.  I have spoken with Dr. Rosea Conch and we will plan for placement of bilateral breast expanders.

## 2023-07-17 ENCOUNTER — Inpatient Hospital Stay: Admission: RE | Admit: 2023-07-17 | Source: Ambulatory Visit

## 2023-07-18 ENCOUNTER — Telehealth: Payer: Self-pay | Admitting: Cardiology

## 2023-07-18 ENCOUNTER — Telehealth: Payer: Self-pay

## 2023-07-18 ENCOUNTER — Ambulatory Visit: Admitting: Student

## 2023-07-18 ENCOUNTER — Other Ambulatory Visit: Payer: Self-pay

## 2023-07-18 ENCOUNTER — Encounter
Admission: RE | Admit: 2023-07-18 | Discharge: 2023-07-18 | Disposition: A | Discharge status: Home / Self Care | Source: Ambulatory Visit | Attending: Surgery | Admitting: Surgery

## 2023-07-18 VITALS — BP 120/76 | HR 77 | Ht 62.0 in | Wt 149.8 lb

## 2023-07-18 DIAGNOSIS — Z1501 Genetic susceptibility to malignant neoplasm of breast: Secondary | ICD-10-CM

## 2023-07-18 DIAGNOSIS — C50919 Malignant neoplasm of unspecified site of unspecified female breast: Secondary | ICD-10-CM

## 2023-07-18 DIAGNOSIS — Z1509 Genetic susceptibility to other malignant neoplasm: Secondary | ICD-10-CM

## 2023-07-18 DIAGNOSIS — Z01812 Encounter for preprocedural laboratory examination: Secondary | ICD-10-CM

## 2023-07-18 MED ORDER — OXYCODONE HCL 5 MG PO TABS: 5.0000 mg | ORAL_TABLET | Freq: Four times a day (QID) | ORAL | 0 refills | Status: DC | PRN

## 2023-07-18 MED ORDER — ONDANSETRON HCL 4 MG PO TABS: 4.0000 mg | ORAL_TABLET | Freq: Three times a day (TID) | ORAL | 0 refills | Status: DC | PRN

## 2023-07-18 MED ORDER — CEPHALEXIN 500 MG PO CAPS
500.0000 mg | ORAL_CAPSULE | Freq: Four times a day (QID) | ORAL | 0 refills | Status: AC
Start: 2023-07-18 — End: 2023-07-21

## 2023-07-18 MED ORDER — DIAZEPAM 2 MG PO TABS
2.0000 mg | ORAL_TABLET | Freq: Two times a day (BID) | ORAL | 0 refills | Status: DC | PRN
Start: 2023-07-18 — End: 2024-01-02

## 2023-07-18 NOTE — Telephone Encounter (Signed)
   Name: Penny Gomez  DOB: Jul 23, 1985  MRN: [id number]  Primary Cardiologist: None  Chart reviewed as part of pre-operative protocol coverage. The patient has an upcoming visit scheduled with Dr. Junnie Olives on 07/22/2023 to establish care at which time clearance can be addressed in case there are any issues that would impact surgical recommendations.  Mastectomy and breast reconstruction with placement of tissue expanders is not scheduled until 07/24/2023 as below. I added preop FYI to appointment note so that provider is aware to address at time of outpatient visit.  Per office protocol the cardiology provider should forward their finalized clearance decision and recommendations regarding antiplatelet therapy to the requesting party below.    I will route this message as FYI to requesting party and remove this message from the preop box as separate preop APP input not needed at this time.   Please call with any questions.  Jude Norton, NP  07/18/2023, 1:38 PM

## 2023-07-18 NOTE — Progress Notes (Signed)
 Patient ID: Penny Gomez, female    DOB: [birth year], 38 y.o.   MRN: [id number]  Chief Complaint  Patient presents with   Pre-op Exam      ICD-10-CM   1. Invasive carcinoma of breast (HCC)  C50.919     2. BRCA1 gene mutation positive  Z15.01    Z15.09        History of Present Illness: Penny Gomez is a 38 y.o.  female  with a history of breast cancer.  She presents for preoperative evaluation for upcoming procedure, bilateral breast reconstruction with placement of expanders and Flex HD, scheduled for 07/24/2023 with Dr.  Orin Birk.  Of note, she is undergoing bilateral mastectomies with Dr. Rosea Conch at the same time.  The patient has not had problems with anesthesia.  Patient reports that she does have history of palpitations and does follow-up with a cardiologist.  She states that she is seeing her cardiologist next week.  Patient states that she is taking the Eliquis  at this time given history of DVT.  She states that Dr. Wilhelmenia Harada has been the one managing her Eliquis  and reported that he was okay with her holding it prior to surgery.  Patient denies taking any birth control or hormone replacement.  She does report history of ectopic pregnancy.  She reports that she had a PE back in November and is currently on Eliquis .  She denies any family history of blood clots.  She denies any personal or family history of clotting diseases.  She denies any recent traumas, surgeries or infections.  She denies any history of stroke or heart attack.  Patient does state that she still has her Port-A-Cath in place.  Patient denies any history of Crohn's disease or ulcerative colitis.  She denies any history of COPD or asthma.  Patient denies any varicosities to her lower extremities.  She denies any recent fevers chills or changes in her health.  Patient reports she is currently a 36C and would like to be a little bit smaller after her reconstruction.  Patient reports that she has completed chemotherapy.   She states that she is most likely going to start immunotherapy, but is unsure when.  She states that she has an appointment to discuss the immunotherapy on June 27.  Patient states that she has not had radiation and there are no plans for radiation.  Patient states that the plan is still for her to have expanders placed at the time of the mastectomies and to have a Diep flap with Dr. Aloma Arrant in about 6 months.  Summary of Previous Visit: Patient was seen for initial consult by Dr. Orin Birk on 05/26/2023.  At this visit, it was noted that patient had finished chemotherapy a week prior.  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  Patient stated that she was a 36C bra size and was okay going smaller.  Patient had a DVT probably related to chemotherapy and was placed on Eliquis .  The patient was interested in a Diep flap and was referred to Dr. Aloma Arrant to discuss that further.  If the Diep flap was not possible, patient stated that she would go ahead with the implants at the same time as her mastectomies.  Patient was then seen again by Dr. Orin Birk on 07/08/2023.  Patient reported she went for a consult to [facility] and spoke with Dr. Aloma Arrant.  Dr. Aloma Arrant recommended that patient have expanders placed and then have a Diep flap done  several months later.  This is what the patient was hoping to do.  The plan was to move forward with immediate reconstruction using the expanders, and then patient may decide to do the Diep flap afterwards which would be about 6 months later.  Job: Does not work at this time  PMH Significant for: Pulmonary embolism, breast cancer, pneumonia, thrombocytopenia, palpitations  Chemotherapy/radiation: Patient reports that she is completely done with chemotherapy, no plans for radiation, but possible plans for immunotherapy  Per chart review, patient had a labs done on 05/16/2023.  Her hemoglobin at that time was 12.1 and her platelets were 187,000.   Past Medical  History: Allergies: No Known Allergies  Current Medications:  Current Outpatient Medications:    apixaban  (ELIQUIS ) 5 MG TABS tablet, Take 1 tablet (5 mg total) by mouth 2 (two) times daily., Disp: 90 tablet, Rfl: 0   cephALEXin  (KEFLEX ) 500 MG capsule, Take 1 capsule (500 mg total) by mouth 4 (four) times daily for 3 days., Disp: 12 capsule, Rfl: 0   diazepam (VALIUM) 2 MG tablet, Take 1 tablet (2 mg total) by mouth every 12 (twelve) hours as needed for up to 20 doses for muscle spasms., Disp: 20 tablet, Rfl: 0   ondansetron  (ZOFRAN ) 4 MG tablet, Take 1 tablet (4 mg total) by mouth every 8 (eight) hours as needed for up to 20 doses for nausea or vomiting., Disp: 20 tablet, Rfl: 0   oxyCODONE  (ROXICODONE ) 5 MG immediate release tablet, Take 1 tablet (5 mg total) by mouth every 6 (six) hours as needed for up to 20 doses for severe pain (pain score 7-10)., Disp: 20 tablet, Rfl: 0   acetaminophen  (TYLENOL ) 500 MG tablet, Take 2 tablets (1,000 mg total) by mouth every 6 (six) hours as needed for mild pain. (Patient not taking: Reported on 07/10/2023), Disp: , Rfl:    lidocaine -prilocaine  (EMLA ) cream, Apply 1 Application topically as needed. (Patient not taking: Reported on 07/10/2023), Disp: 30 g, Rfl: 11   senna (SENOKOT) 8.6 MG TABS tablet, Take 2 tablets (17.2 mg total) by mouth daily. (Patient not taking: Reported on 07/18/2023), Disp: 120 tablet, Rfl: 2   simethicone  (MYLICON) 80 MG chewable tablet, Chew 1 tablet (80 mg total) by mouth every 6 (six) hours as needed for flatulence. 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)    Microcytic anemia    MSSA bacteremia    Multifocal pneumonia    Sepsis (HCC)    due to port  a cath   STD (sexually transmitted disease)    From Medical Hx;   Thrombocytopenia (HCC)    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   Vascular port complication     Past Surgical History: Past Surgical History:  Procedure Laterality Date   BREAST BIOPSY Right 05/20/2019   Affirm Bx- X- clip, neg   BREAST BIOPSY Left 10/02/2022   US  Bx, path pending   BREAST BIOPSY Left 10/02/2022   Us  Bx Node- path pending   BREAST BIOPSY Left 10/02/2022   US  LT BREAST BX W LOC DEV 1ST LESION IMG BX SPEC US  GUIDE 10/02/2022 [facility]   CESAREAN SECTION  x 2   LAPAROSCOPIC BILATERAL SALPINGECTOMY Bilateral 02/09/2022   Procedure: LAPAROSCOPIC BILATERAL SALPINGECTOMY;  Surgeon: Teresa Fender, MD;  Location: [facility];  Service: Gynecology;  Laterality: Bilateral;   PORTACATH PLACEMENT Right 10/17/2022   Procedure: INSERTION PORT-A-CATH;  Surgeon: Emmalene Hare, MD;  Location: [facility];  Service: General;  Laterality: Right;   PORTACATH PLACEMENT Left 02/10/2023   Procedure: INSERTION PORT-A-CATH;  Surgeon: Conrado Delay, DO;  Location: [facility];  Service: General;  Laterality: Left;   TEE WITHOUT CARDIOVERSION N/A 01/02/2023   Procedure: TRANSESOPHAGEAL ECHOCARDIOGRAM (TEE);  Surgeon: Devorah Fonder, MD;  Location: [facility];  Service: Cardiovascular;  Laterality: N/A;   TUBAL LIGATION      Social History: Social History   Socioeconomic History   Marital status: Single    Spouse name: Not on file   Number of children: 2   Years of education: Not on file   Highest education level: High school graduate  Occupational History   Not on file  Tobacco Use   Smoking status: Never    Passive exposure: Never   Smokeless tobacco: Never  Vaping Use   Vaping status: Never Used  Substance and Sexual Activity   Alcohol use: Never   Drug use: No   Sexual activity: Yes    Birth control/protection: Surgical  Other Topics Concern   Not on file  Social History Narrative   Lives with  mom and 2 kids   Social Drivers of Health   Financial Resource Strain: Low Risk  (04/16/2023)   Received from [facility]   Overall Financial Resource Strain (CARDIA)    Difficulty of Paying Living Expenses: Not hard at all  Food Insecurity: No Food Insecurity (04/16/2023)   Received from [facility]   Hunger Vital Sign    Worried About Running Out of Food in the Last Year: Never true    Ran Out of Food in the Last Year: Never true  Transportation Needs: No Transportation Needs (04/16/2023)   Received from [facility] - Transportation    In the past 12 months, has lack of transportation kept you from medical appointments or from getting medications?: No    Lack of Transportation (Non-Medical): No  Physical Activity: Not on file  Stress: No Stress Concern Present (10/10/2022)   [employer] [facility] [employer] - Occupational Stress Questionnaire    Feeling of Stress : Only a little  Social Connections: Not on file  Intimate Partner Violence: Not At Risk (12/31/2022)   Humiliation, Afraid, Rape, and Kick questionnaire    Fear of Current or Ex-Partner: No    Emotionally Abused: No    Physically Abused: No    Sexually Abused: No    Family History: Family History  Problem Relation Age of Onset   Breast cancer Mother 98       no GT   Breast cancer Maternal Aunt 33   Breast cancer Maternal Aunt    Lung cancer Maternal Uncle    Lung cancer Maternal Uncle    Bone cancer Paternal Aunt    Pancreatic cancer Maternal Grandmother 16   Lung cancer Maternal Grandfather 50   Breast cancer Cousin 40   Breast cancer Other 33       gene pos?    Review of Systems: Denies any fevers, chills or changes in health  Physical Exam: Vital Signs BP 120/76 (BP Location: Left Arm, Patient Position: Sitting, Cuff Size: Normal)   Pulse  77   Ht 5\' 2"  (1.575 m)   Wt 149 lb 12.8 oz (67.9 kg)   LMP 04/12/2023 (Approximate)   SpO2  100%   BMI 27.40 kg/m   Physical Exam  Constitutional:      General: Not in acute distress.    Appearance: Normal appearance. Not ill-appearing.  HENT:     Head: Normocephalic and atraumatic.  Neck:     Musculoskeletal: Normal range of motion.  Cardiovascular:     Rate and Rhythm: Normal rate Pulmonary:     Effort: Pulmonary effort is normal. No respiratory distress.  Musculoskeletal: Normal range of motion.  Skin:    General: Skin is warm and dry.     Findings: No erythema or rash.  Neurological:     Mental Status: Alert and oriented to person, place, and time. Mental status is at baseline.  Psychiatric:        Mood and Affect: Mood normal.        Behavior: Behavior normal.    Assessment/Plan: The patient is scheduled for bilateral breast reconstruction with placement of expanders and Flex HD with Dr. Orin Birk.  Risks, benefits, and alternatives of procedure discussed, questions answered and consent obtained.    Will send clearance to patient's cardiologist.  Also will reach out to Dr. Wilhelmenia Harada to make sure that patient may hold Eliquis  prior to surgery and that she is okay with proceeding with surgery.  Smoking Status: Non-smoker; Counseling Given?  N/A Last Mammogram: 03/10/2023; Results: Known biopsy-proven malignancy  Caprini Score: 10; Risk Factors include: History of DVT/PE, history of malignancy, Port-A-Cath in place, BMI greater than 25, and length of planned surgery. Recommendation for mechanical and possible pharmacological prophylaxis. Encourage early ambulation.  Will discuss anticoagulation plan with Dr. Orin Birk, but will most likely have patient restart her Eliquis  24 hours after surgery.  Pictures obtained: @consult   Post-op Rx sent to pharmacy: Oxycodone , Zofran , Keflex , Valium  We will plan to have the patient hold her Eliquis  2 days prior to surgery.   Patient was provided with the breast reconstruction and General Surgical Risk consent document and Pain  Medication Agreement prior to their appointment.  They had adequate time to read through the risk consent documents and Pain Medication Agreement. We also discussed them in person together during this preop appointment. All of their questions were answered to their satisfaction.  Recommended calling if they have any further questions.  Risk consent form and Pain Medication Agreement to be scanned into patient's chart.  The risks that can be encountered with and after placement of a breast expander placement were discussed and include the following but not limited to these: bleeding, infection, delayed healing, anesthesia risks, skin sensation changes, injury to structures including nerves, blood vessels, and muscles which may be temporary or permanent, allergies to tape, suture materials and glues, blood products, topical preparations or injected agents, skin contour irregularities, skin discoloration and swelling, deep vein thrombosis, cardiac and pulmonary complications, pain, which may persist, fluid accumulation, wrinkling of the skin over the expander, changes in nipple or breast sensation, expander leakage or rupture, faulty position of the expander, persistent pain, formation of tight scar tissue around the expander (capsular contracture), possible need for revisional surgery or staged procedures.   Electronically signed by: Penny Leyden, PA-C 07/18/2023 11:03 AM

## 2023-07-18 NOTE — Telephone Encounter (Signed)
   Pre-operative Risk Assessment    Patient Name: Penny Gomez  DOB: [birth year] MRN: [id number]   Date of last office visit: 03/21/23 [facility] Date of next office visit: 07/22/23   Request for Surgical Clearance    Procedure:  Mastectomy and breast reconstruction with placement of tissue expanders  Date of Surgery:  Clearance 07/24/23                                Surgeon:  Dr. Gilles Lacks  Surgeon's Group or Practice Name:  [facility] Phone number:  [phone] Fax number:  [phone]   Type of Clearance Requested:   - Medical    Type of Anesthesia:  Not Indicated   Additional requests/questions:    Signed, Fletcher Humble   07/18/2023, 1:14 PM

## 2023-07-18 NOTE — Patient Instructions (Addendum)
 Your procedure is scheduled on: 07/24/23 - Thursday Report to the Registration Desk on the 1st floor of the [facility]. To find out your arrival time, please call [phone] between 1PM - 3PM on: 07/23/23 - Wednesday If your arrival time is 6:00 am, do not arrive before that time as the [facility] entrance doors do not open until 6:00 am.  REMEMBER: Instructions that are not followed completely may result in serious medical risk, up to and including death; or upon the discretion of your surgeon and anesthesiologist your surgery may need to be rescheduled.  Do not eat food after midnight the night before surgery.  No gum chewing or hard candies.  You may however, drink CLEAR liquids up to 2 hours before you are scheduled to arrive for your surgery. Do not drink anything within 2 hours of your scheduled arrival time.  Clear liquids include: - water  - apple juice without pulp - gatorade (not RED colors) - black coffee or tea (Do NOT add milk or creamers to the coffee or tea) Do NOT drink anything that is not on this list.  One week prior to surgery: Stop Anti-inflammatories (NSAIDS) such as Advil , Aleve, Ibuprofen , Motrin , Naproxen, Naprosyn and Aspirin based products such as Excedrin, Goody's Powder, BC Powder. You may take Tylenol  if needed for pain up until the day of surgery.  Stop ANY OVER THE COUNTER supplements until after surgery.  Hold Eliquis  beginning 07/21/23.  ON THE DAY OF SURGERY ONLY TAKE THESE MEDICATIONS WITH SIPS OF WATER:  none   No Alcohol for 24 hours before or after surgery.  No Smoking including e-cigarettes for 24 hours before surgery.  No chewable tobacco products for at least 6 hours before surgery.  No nicotine patches on the day of surgery.  Do not use any "recreational" drugs for at least a week (preferably 2 weeks) before your surgery.  Please be advised that the combination of cocaine and anesthesia may have negative outcomes, up to and  including death. If you test positive for cocaine, your surgery will be cancelled.  On the morning of surgery brush your teeth with toothpaste and water, you may rinse your mouth with mouthwash if you wish. Do not swallow any toothpaste or mouthwash.  Use CHG Soap or wipes as directed on instruction sheet.  Do not wear jewelry, make-up, hairpins, clips or nail polish.  For welded (permanent) jewelry: bracelets, anklets, waist bands, etc.  Please have this removed prior to surgery.  If it is not removed, there is a chance that hospital personnel will need to cut it off on the day of surgery.  Do not wear lotions, powders, or perfumes.   Do not shave body hair from the neck down 48 hours before surgery.  Contact lenses, hearing aids and dentures may not be worn into surgery.  Do not bring valuables to the hospital. [facility] is not responsible for any missing/lost belongings or valuables.   Notify your doctor if there is any change in your medical condition (cold, fever, infection).  Wear comfortable clothing (specific to your surgery type) to the hospital.  After surgery, you can help prevent lung complications by doing breathing exercises.  Take deep breaths and cough every 1-2 hours. Your doctor may order a device called an Incentive Spirometer to help you take deep breaths.  When coughing or sneezing, hold a pillow firmly against your incision with both hands. This is called "splinting." Doing this helps protect your incision. It also  decreases belly discomfort.  If you are being admitted to the hospital overnight, leave your suitcase in the car. After surgery it may be brought to your room.  In case of increased patient census, it may be necessary for you, the patient, to continue your postoperative care in the Same Day Surgery department.  If you are being discharged the day of surgery, you will not be allowed to drive home. You will need a responsible individual to drive you  home and stay with you for 24 hours after surgery.   If you are taking public transportation, you will need to have a responsible individual with you.  Please call the Pre-admissions Testing Dept. at [phone] if you have any questions about these instructions.  Surgery Visitation Policy:  Patients having surgery or a procedure may have two visitors.  Children under the age of 25 must have an adult with them who is not the patient.  Inpatient Visitation:    Visiting hours are 7 a.m. to 8 p.m. Up to four visitors are allowed at one time in a patient room. The visitors may rotate out with other people during the day.  One visitor age 25 or older may stay with the patient overnight and must be in the room by 8 p.m.     Preparing for Surgery with CHLORHEXIDINE  GLUCONATE (CHG) Soap  Chlorhexidine  Gluconate (CHG) Soap  o An antiseptic cleaner that kills germs and bonds with the skin to continue killing germs even after washing  o Used for showering the night before surgery and morning of surgery  Before surgery, you can play an important role by reducing the number of germs on your skin.  CHG (Chlorhexidine  gluconate) soap is an antiseptic cleanser which kills germs and bonds with the skin to continue killing germs even after washing.  Please do not use if you have an allergy to CHG or antibacterial soaps. If your skin becomes reddened/irritated stop using the CHG.  1. Shower the NIGHT BEFORE SURGERY and the MORNING OF SURGERY with CHG soap.  2. If you choose to wash your hair, wash your hair first as usual with your normal shampoo.  3. After shampooing, rinse your hair and body thoroughly to remove the shampoo.  4. Use CHG as you would any other liquid soap. You can apply CHG directly to the skin and wash gently with a scrungie or a clean washcloth.  5. Apply the CHG soap to your body only from the neck down. Do not use on open wounds or open sores. Avoid contact with your  eyes, ears, mouth, and genitals (private parts). Wash face and genitals (private parts) with your normal soap.  6. Wash thoroughly, paying special attention to the area where your surgery will be performed.  7. Thoroughly rinse your body with warm water.  8. Do not shower/wash with your normal soap after using and rinsing off the CHG soap.  9. Pat yourself dry with a clean towel.  10. Wear clean pajamas to bed the night before surgery.  12. Place clean sheets on your bed the night of your first shower and do not sleep with pets.  13. Shower again with the CHG soap on the day of surgery prior to arriving at the hospital.  14. Do not apply any deodorants/lotions/powders.  15. Please wear clean clothes to the hospital.

## 2023-07-18 NOTE — Telephone Encounter (Signed)
 Faxed Surgical Clearance to Aretha Kubas, MD for BL Mastectomies and B/L Breast Reconstruction with Placement of Tissue Expanders.  Received Fax confirmation  This message was sent via FAXCOM, a product from [employer]. [email or web address].biscom.com/                    -------Fax Transmission Report-------  To:               Recipient at [phone] Subject:          Penny Gomez Result:           The transmission was successful. Explanation:      All Pages Ok Pages Sent:       3 Connect Time:     0 minutes, 57 seconds Transmit Time:    07/18/2023 12:16 Transfer Rate:    14400 Status Code:      0000 Retry Count:      0 Job Id:           [id number] Unique Id:        [id number] Fax Line:         60 Fax Server:       [id number]

## 2023-07-22 ENCOUNTER — Ambulatory Visit: Admitting: Cardiology

## 2023-07-23 ENCOUNTER — Encounter: Payer: Self-pay | Admitting: Cardiology

## 2023-07-23 ENCOUNTER — Ambulatory Visit

## 2023-07-23 ENCOUNTER — Ambulatory Visit: Attending: Cardiology | Admitting: Cardiology

## 2023-07-23 VITALS — BP 98/55 | HR 62 | Ht 62.0 in | Wt 151.4 lb

## 2023-07-23 DIAGNOSIS — C50919 Malignant neoplasm of unspecified site of unspecified female breast: Secondary | ICD-10-CM | POA: Diagnosis not present

## 2023-07-23 DIAGNOSIS — R002 Palpitations: Secondary | ICD-10-CM

## 2023-07-23 NOTE — Patient Instructions (Signed)
 Medication Instructions:  Your physician recommends that you continue on your current medications as directed. Please refer to the Current Medication list given to you today.   *If you need a refill on your cardiac medications before your next appointment, please call your pharmacy*  Lab Work: No labs ordered today  If you have labs (blood work) drawn today and your tests are completely normal, you will receive your results only by: MyChart Message (if you have MyChart) OR A paper copy in the mail If you have any lab test that is abnormal or we need to change your treatment, we will call you to review the results.  Testing/Procedures: Your physician has recommended that you wear a Zio monitor.   This monitor is a medical device that records the heart's electrical activity. Doctors most often use these monitors to diagnose arrhythmias. Arrhythmias are problems with the speed or rhythm of the heartbeat. The monitor is a small device applied to your chest. You can wear one while you do your normal daily activities. While wearing this monitor if you have any symptoms to push the button and record what you felt. Once you have worn this monitor for the period of time provider prescribed (Usually 14 days), you will return the monitor device in the postage paid box. Once it is returned they will download the data collected and provide us  with a report which the provider will then review and we will call you with those results. Important tips:  Avoid showering during the first 24 hours of wearing the monitor. Avoid excessive sweating to help maximize wear time. Do not submerge the device, no hot tubs, and no swimming pools. Keep any lotions or oils away from the patch. After 24 hours you may shower with the patch on. Take brief showers with your back facing the shower head.  Do not remove patch once it has been placed because that will interrupt data and decrease adhesive wear time. Push the button when  you have any symptoms and write down what you were feeling. Once you have completed wearing your monitor, remove and place into box which has postage paid and place in your outgoing mailbox.  If for some reason you have misplaced your box then call our office and we can provide another box and/or mail it off for you.   Follow-Up: At [facility], you and your health needs are our priority.  As part of our continuing mission to provide you with exceptional heart care, our providers are all part of one team.  This team includes your primary Cardiologist (physician) and Advanced Practice Providers or APPs (Physician Assistants and Nurse Practitioners) who all work together to provide you with the care you need, when you need it.  Your next appointment:   8 week(s)  Provider:   You may see Dr. Junnie Olives or one of the following Advanced Practice Providers on your designated Care Team:   Laneta Pintos, NP Gildardo Labrador, PA-C Varney Gentleman, PA-C Cadence [place], PA-C Ronald Cockayne, NP Morey Ar, NP    We recommend signing up for the patient portal called MyChart.  Sign up information is provided on this After Visit Summary.  MyChart is used to connect with patients for Virtual Visits (Telemedicine).  Patients are able to view lab/test results, encounter notes, upcoming appointments, etc.  Non-urgent messages can be sent to your provider as well.   To learn more about what you can do with MyChart, go to [email or web address].

## 2023-07-23 NOTE — Progress Notes (Signed)
 Cardiology Office Note:    Date:  07/23/2023   ID:  Penny Gomez, DOB 09-Jun-1985, MRN [id number]  PCP:  Rex Castor, MD   [facility] Providers Cardiologist:  None     Referring MD: Aneita Baptise, [place]   Chief Complaint  Patient presents with   Follow-up    New patient  has been doing well with no complaints of chest pain, chest pressure or SOB, medciation reviewed verbally with patient, had some palpations back in March 2025    History of Present Illness:    Penny Gomez is a 38 y.o. female with a hx of left breast cancer, s/p chemo, PE on Eliquis , who presents due to palpitations.  Patient diagnosed with breast cancer in the fall of 2024.  Has undergone chemotherapy, planning on double mastectomy this week.  States having symptoms of palpitations while on chemo.  Palpitations occur randomly not associated with any exertion, usually when patient was laying down.  Symptoms typically last a few minutes.  Denies any dizziness, syncope.  Denies chest pain or shortness of breath.  She finished chemo about 4 to 6 weeks ago.  Has not had any palpitations since stopping chemo.  Echocardiogram 03/2023 EF 60 to 65%, normal diastolic function, normal GLS.  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   Vascular port complication     Past Surgical History:  Procedure Laterality Date   BREAST BIOPSY Right 05/20/2019   Affirm Bx- X- clip, neg   BREAST BIOPSY Left 10/02/2022   US  Bx, path pending   BREAST BIOPSY Left 10/02/2022   Us  Bx Node- path  pending   BREAST BIOPSY Left 10/02/2022   US  LT BREAST BX W LOC DEV 1ST LESION IMG BX SPEC US  GUIDE 10/02/2022 [facility]   CESAREAN SECTION     x 2   LAPAROSCOPIC BILATERAL SALPINGECTOMY Bilateral 02/09/2022   Procedure: LAPAROSCOPIC BILATERAL SALPINGECTOMY;  Surgeon: Teresa Fender, MD;  Location: [facility];  Service: Gynecology;  Laterality: Bilateral;   PORTACATH PLACEMENT Right 10/17/2022   Procedure: INSERTION PORT-A-CATH;  Surgeon: Emmalene Hare, MD;  Location: [facility];  Service: General;  Laterality: Right;   PORTACATH PLACEMENT Left 02/10/2023   Procedure: INSERTION PORT-A-CATH;  Surgeon: Conrado Delay, DO;  Location: [facility];  Service: General;  Laterality: Left;   TEE WITHOUT CARDIOVERSION N/A 01/02/2023   Procedure: TRANSESOPHAGEAL ECHOCARDIOGRAM (TEE);  Surgeon: Gollan, Timothy J, MD;  Location: [facility];  Service: Cardiovascular;  Laterality: N/A;   TUBAL LIGATION      Current Medications: Current Meds  Medication Sig   apixaban  (ELIQUIS ) 5 MG TABS tablet Take 1 tablet (5 mg total) by mouth 2 (two) times daily.   diazepam  (VALIUM ) 2 MG tablet Take 1 tablet (2 mg total) by mouth every 12 (twelve) hours as needed for up to 20 doses for muscle spasms.   ondansetron  (ZOFRAN ) 4 MG tablet Take 1 tablet (4 mg total) by mouth every  8 (eight) hours as needed for up to 20 doses for nausea or vomiting.   oxyCODONE  (ROXICODONE ) 5 MG immediate release tablet Take 1 tablet (5 mg total) by mouth every 6 (six) hours as needed for up to 20 doses for severe pain (pain score 7-10).   senna (SENOKOT) 8.6 MG TABS tablet Take 2 tablets (17.2 mg total) by mouth daily.     Allergies:   Patient has no known allergies.   Social History   Socioeconomic History   Marital status: Single    Spouse name: Not on file   Number of children: 2   Years of education: Not on file   Highest education level: High school graduate  Occupational History   Not on file  Tobacco Use   Smoking status:  Never    Passive exposure: Never   Smokeless tobacco: Never  Vaping Use   Vaping status: Never Used  Substance and Sexual Activity   Alcohol use: Never   Drug use: No   Sexual activity: Yes    Birth control/protection: Surgical  Other Topics Concern   Not on file  Social History Narrative   Lives with mom and 2 kids   Social Drivers of Health   Financial Resource Strain: Low Risk  (04/16/2023)   Received from [facility]   Overall Financial Resource Strain (CARDIA)    Difficulty of Paying Living Expenses: Not hard at all  Food Insecurity: No Food Insecurity (04/16/2023)   Received from [facility]   Hunger Vital Sign    Worried About Running Out of Food in the Last Year: Never true    Ran Out of Food in the Last Year: Never true  Transportation Needs: No Transportation Needs (04/16/2023)   Received from [facility] - Transportation    In the past 12 months, has lack of transportation kept you from medical appointments or from getting medications?: No    Lack of Transportation (Non-Medical): No  Physical Activity: Not on file  Stress: No Stress Concern Present (10/10/2022)   [employer] [facility] [employer] - Occupational Stress Questionnaire    Feeling of Stress : Only a little  Social Connections: Not on file     Family History: The patient's family history includes Bone cancer in her paternal aunt; Breast cancer in her maternal aunt; Breast cancer (age of onset: 43) in her maternal aunt, mother, and another family member; Breast cancer (age of onset: 80) in her cousin; Lung cancer in her maternal uncle and maternal uncle; Lung cancer (age of onset: 45) in her maternal grandfather; Pancreatic cancer (age of onset: 39) in her maternal grandmother.  ROS:   Please see the history of present illness.     All other systems reviewed and are negative.  EKGs/Labs/Other Studies Reviewed:    The following studies  were reviewed today:  EKG Interpretation Date/Time:  Wednesday July 23 2023 10:52:42 EDT Ventricular Rate:  62 PR Interval:  126 QRS Duration:  88 QT Interval:  430 QTC Calculation: 436 R Axis:   40  Text Interpretation: Normal sinus rhythm Normal ECG Confirmed by Constancia Delton ([id number]) on 07/23/2023 11:22:39 AM    Recent Labs: 04/04/2023: Magnesium 2.2; TSH 0.409 05/16/2023: ALT 26; BUN 12; Creatinine 0.46; Hemoglobin 12.1; Platelet Count 187; Potassium 3.7; Sodium 138  Recent Lipid Panel No results found for: CHOL, TRIG, HDL, CHOLHDL, VLDL, LDLCALC, LDLDIRECT   Risk Assessment/Calculations:  Physical Exam:    VS:  BP (!) 98/55 (BP Location: Left Arm, Patient Position: Sitting, Cuff Size: Normal)   Pulse 62   Ht 5' 2 (1.575 m)   Wt 151 lb 6.4 oz (68.7 kg)   LMP 04/12/2023 (Approximate)   SpO2 98%   BMI 27.69 kg/m     Wt Readings from Last 3 Encounters:  07/23/23 151 lb 6.4 oz (68.7 kg)  07/18/23 149 lb 12.8 oz (67.9 kg)  05/26/23 148 lb 12.8 oz (67.5 kg)     GEN:  Well nourished, well developed in no acute distress HEENT: Normal NECK: No JVD; No carotid bruits CARDIAC: RRR, no murmurs, rubs, gallops RESPIRATORY:  Clear to auscultation without rales, wheezing or rhonchi  ABDOMEN: Soft, non-tender, non-distended MUSCULOSKELETAL:  No edema; No deformity  SKIN: Warm and dry NEUROLOGIC:  Alert and oriented x 3 PSYCHIATRIC:  Normal affect   ASSESSMENT:    1. Palpitations   2. Invasive carcinoma of breast (HCC)    PLAN:    In order of problems listed above:  Palpitations, coinciding with chemotherapy.  Symptoms overall resolved since stopping chemo.  Which indicates symptoms might be chemo induced.  Placed cardiac monitor to rule out any significant arrhythmias.  Patient overall low cardiac risk.  Echo 03/2023 showed normal EF, normal diastolic function. Breast cancer, management as per oncology.  Follow-up after cardiac  monitor     Medication Adjustments/Labs and Tests Ordered: Current medicines are reviewed at length with the patient today.  Concerns regarding medicines are outlined above.  Orders Placed This Encounter  Procedures   LONG TERM MONITOR (3-14 DAYS)   EKG 12-Lead   No orders of the defined types were placed in this encounter.   Patient Instructions  Medication Instructions:  Your physician recommends that you continue on your current medications as directed. Please refer to the Current Medication list given to you today.   *If you need a refill on your cardiac medications before your next appointment, please call your pharmacy*  Lab Work: No labs ordered today  If you have labs (blood work) drawn today and your tests are completely normal, you will receive your results only by: MyChart Message (if you have MyChart) OR A paper copy in the mail If you have any lab test that is abnormal or we need to change your treatment, we will call you to review the results.  Testing/Procedures: Your physician has recommended that you wear a Zio monitor.   This monitor is a medical device that records the heart's electrical activity. Doctors most often use these monitors to diagnose arrhythmias. Arrhythmias are problems with the speed or rhythm of the heartbeat. The monitor is a small device applied to your chest. You can wear one while you do your normal daily activities. While wearing this monitor if you have any symptoms to push the button and record what you felt. Once you have worn this monitor for the period of time provider prescribed (Usually 14 days), you will return the monitor device in the postage paid box. Once it is returned they will download the data collected and provide us  with a report which the provider will then review and we will call you with those results. Important tips:  Avoid showering during the first 24 hours of wearing the monitor. Avoid excessive sweating to help maximize  wear time. Do not submerge the device, no hot tubs, and no swimming pools. Keep any lotions or oils away from the patch. After 24  hours you may shower with the patch on. Take brief showers with your back facing the shower head.  Do not remove patch once it has been placed because that will interrupt data and decrease adhesive wear time. Push the button when you have any symptoms and write down what you were feeling. Once you have completed wearing your monitor, remove and place into box which has postage paid and place in your outgoing mailbox.  If for some reason you have misplaced your box then call our office and we can provide another box and/or mail it off for you.   Follow-Up: At [facility], you and your health needs are our priority.  As part of our continuing mission to provide you with exceptional heart care, our providers are all part of one team.  This team includes your primary Cardiologist (physician) and Advanced Practice Providers or APPs (Physician Assistants and Nurse Practitioners) who all work together to provide you with the care you need, when you need it.  Your next appointment:   8 week(s)  Provider:   You may see Dr. Junnie Olives or one of the following Advanced Practice Providers on your designated Care Team:   Laneta Pintos, NP Gildardo Labrador, PA-C Varney Gentleman, PA-C Cadence [place], PA-C Ronald Cockayne, NP Morey Ar, NP    We recommend signing up for the patient portal called MyChart.  Sign up information is provided on this After Visit Summary.  MyChart is used to connect with patients for Virtual Visits (Telemedicine).  Patients are able to view lab/test results, encounter notes, upcoming appointments, etc.  Non-urgent messages can be sent to your provider as well.   To learn more about what you can do with MyChart, go to [email or web address].      Signed, Constancia Delton, MD  07/23/2023 12:39 PM     [facility]

## 2023-07-24 ENCOUNTER — Ambulatory Visit: Admitting: Anesthesiology

## 2023-07-24 ENCOUNTER — Observation Stay
Admission: RE | Admit: 2023-07-24 | Discharge: 2023-07-24 | Disposition: A | Discharge status: Home / Self Care | Source: Ambulatory Visit | Attending: Surgery | Admitting: Surgery

## 2023-07-24 ENCOUNTER — Encounter
Admission: RE | Disposition: A | Discharge status: Home / Self Care | Payer: Self-pay | Source: Home / Self Care | Attending: Surgery

## 2023-07-24 ENCOUNTER — Observation Stay
Admission: RE | Admit: 2023-07-24 | Discharge: 2023-07-25 | Disposition: A | Discharge status: Home / Self Care | Attending: Surgery | Admitting: Surgery

## 2023-07-24 ENCOUNTER — Other Ambulatory Visit: Payer: Self-pay

## 2023-07-24 ENCOUNTER — Encounter: Payer: Self-pay | Admitting: Surgery

## 2023-07-24 DIAGNOSIS — N6022 Fibroadenosis of left breast: Secondary | ICD-10-CM | POA: Insufficient documentation

## 2023-07-24 DIAGNOSIS — Z1501 Genetic susceptibility to malignant neoplasm of breast: Secondary | ICD-10-CM | POA: Insufficient documentation

## 2023-07-24 DIAGNOSIS — Z421 Encounter for breast reconstruction following mastectomy: Secondary | ICD-10-CM

## 2023-07-24 DIAGNOSIS — Z01812 Encounter for preprocedural laboratory examination: Secondary | ICD-10-CM

## 2023-07-24 DIAGNOSIS — C50919 Malignant neoplasm of unspecified site of unspecified female breast: Principal | ICD-10-CM | POA: Diagnosis present

## 2023-07-24 DIAGNOSIS — C50912 Malignant neoplasm of unspecified site of left female breast: Principal | ICD-10-CM

## 2023-07-24 DIAGNOSIS — N6021 Fibroadenosis of right breast: Secondary | ICD-10-CM | POA: Insufficient documentation

## 2023-07-24 HISTORY — PX: MASTECTOMY W/ SENTINEL NODE BIOPSY: SHX2001

## 2023-07-24 HISTORY — PX: BREAST RECONSTRUCTION: SHX9

## 2023-07-24 LAB — CBC
HCT: 34.9 % — ABNORMAL LOW (ref 36.0–46.0)
Hemoglobin: 11.7 g/dL — ABNORMAL LOW (ref 12.0–15.0)
MCH: 30.5 pg (ref 26.0–34.0)
MCHC: 33.5 g/dL (ref 30.0–36.0)
MCV: 91.1 fL (ref 80.0–100.0)
Platelets: 165 10*3/uL (ref 150–400)
RBC: 3.83 MIL/uL — ABNORMAL LOW (ref 3.87–5.11)
RDW: 12.1 % (ref 11.5–15.5)
WBC: 8.7 10*3/uL (ref 4.0–10.5)
nRBC: 0 % (ref 0.0–0.2)

## 2023-07-24 LAB — CREATININE, SERUM
Creatinine, Ser: 0.55 mg/dL (ref 0.44–1.00)
GFR, Estimated: >60 mL/min (ref >60)

## 2023-07-24 LAB — POCT PREGNANCY, URINE: Preg Test, Ur: NEGATIVE

## 2023-07-24 SURGERY — MASTECTOMY WITH SENTINEL LYMPH NODE BIOPSY
Anesthesia: General | Site: Breast | Laterality: Bilateral

## 2023-07-24 MED ORDER — KETAMINE HCL 50 MG/5ML IJ SOSY
PREFILLED_SYRINGE | INTRAMUSCULAR | Status: DC | PRN
Start: 2023-07-24 — End: 2023-07-24
  Administered 2023-07-24: 20 mg via INTRAVENOUS
  Administered 2023-07-24: 30 mg via INTRAVENOUS

## 2023-07-24 MED ORDER — LACTATED RINGERS IV SOLN: INTRAVENOUS | Status: DC | PRN

## 2023-07-24 MED ORDER — ONDANSETRON 4 MG PO TBDP
4.0000 mg | ORAL_TABLET | Freq: Three times a day (TID) | ORAL | Status: DC | PRN
  Administered 2023-07-24: 4 mg via ORAL
  Filled 2023-07-24: qty 1

## 2023-07-24 MED ORDER — TECHNETIUM TC 99M TILMANOCEPT KIT
1.1400 | PACK | Freq: Once | INTRAVENOUS | Status: AC | PRN
  Administered 2023-07-24: 1.14 via INTRADERMAL

## 2023-07-24 MED ORDER — CELECOXIB 200 MG PO CAPS
200.0000 mg | ORAL_CAPSULE | ORAL | Status: AC
  Administered 2023-07-24: 200 mg via ORAL

## 2023-07-24 MED ORDER — ACETAMINOPHEN 325 MG PO TABS
650.0000 mg | ORAL_TABLET | Freq: Four times a day (QID) | ORAL | Status: DC | PRN
Start: 2023-07-24 — End: 2023-07-25

## 2023-07-24 MED ORDER — DIAZEPAM 2 MG PO TABS: 2.0000 mg | ORAL_TABLET | Freq: Two times a day (BID) | ORAL | Status: DC | PRN

## 2023-07-24 MED ORDER — DEXAMETHASONE SODIUM PHOSPHATE 10 MG/ML IJ SOLN
INTRAMUSCULAR | Status: AC
  Filled 2023-07-24: qty 1

## 2023-07-24 MED ORDER — VASHE WOUND IRRIGATION OPTIME
TOPICAL | Status: DC | PRN
  Administered 2023-07-24: 34 [oz_av]

## 2023-07-24 MED ORDER — CHLORHEXIDINE GLUCONATE CLOTH 2 % EX PADS
6.0000 | MEDICATED_PAD | Freq: Once | CUTANEOUS | Status: AC
  Administered 2023-07-24: 6 via TOPICAL

## 2023-07-24 MED ORDER — ONDANSETRON HCL 4 MG/2ML IJ SOLN
INTRAMUSCULAR | Status: AC
Start: 2023-07-24 — End: 2023-07-24
  Filled 2023-07-24: qty 2

## 2023-07-24 MED ORDER — MORPHINE SULFATE (PF) 2 MG/ML IV SOLN: 1.0000 mg | INTRAVENOUS | Status: DC | PRN

## 2023-07-24 MED ORDER — BUPIVACAINE LIPOSOME 1.3 % IJ SUSP
INTRAMUSCULAR | Status: DC | PRN
  Administered 2023-07-24: 50 mL

## 2023-07-24 MED ORDER — MIDAZOLAM HCL 2 MG/2ML IJ SOLN
INTRAMUSCULAR | Status: DC | PRN
  Administered 2023-07-24: 2 mg via INTRAVENOUS

## 2023-07-24 MED ORDER — GABAPENTIN 300 MG PO CAPS
ORAL_CAPSULE | ORAL | Status: AC
  Filled 2023-07-24: qty 1

## 2023-07-24 MED ORDER — CHLORHEXIDINE GLUCONATE 0.12 % MT SOLN
OROMUCOSAL | Status: AC
  Filled 2023-07-24: qty 15

## 2023-07-24 MED ORDER — 0.9 % SODIUM CHLORIDE (POUR BTL) OPTIME
TOPICAL | Status: DC | PRN
  Administered 2023-07-24: 1000 mL

## 2023-07-24 MED ORDER — ACETAMINOPHEN 10 MG/ML IV SOLN: 1000.0000 mg | Freq: Once | INTRAVENOUS | Status: DC | PRN

## 2023-07-24 MED ORDER — SUCCINYLCHOLINE CHLORIDE 200 MG/10ML IV SOSY
PREFILLED_SYRINGE | INTRAVENOUS | Status: DC | PRN
  Administered 2023-07-24: 100 mg via INTRAVENOUS

## 2023-07-24 MED ORDER — SUGAMMADEX SODIUM 200 MG/2ML IV SOLN
INTRAVENOUS | Status: DC | PRN
  Administered 2023-07-24: 150 mg via INTRAVENOUS

## 2023-07-24 MED ORDER — OXYCODONE HCL 5 MG/5ML PO SOLN: 5.0000 mg | Freq: Once | ORAL | Status: AC | PRN

## 2023-07-24 MED ORDER — ONDANSETRON HCL 4 MG PO TABS: 4.0000 mg | ORAL_TABLET | Freq: Three times a day (TID) | ORAL | Status: DC | PRN

## 2023-07-24 MED ORDER — ROCURONIUM BROMIDE 100 MG/10ML IV SOLN
INTRAVENOUS | Status: DC | PRN
  Administered 2023-07-24: 30 mg via INTRAVENOUS
  Administered 2023-07-24 (×2): 50 mg via INTRAVENOUS

## 2023-07-24 MED ORDER — CHLORHEXIDINE GLUCONATE 0.12 % MT SOLN
15.0000 mL | Freq: Once | OROMUCOSAL | Status: AC
  Administered 2023-07-24: 15 mL via OROMUCOSAL

## 2023-07-24 MED ORDER — BUPIVACAINE LIPOSOME 1.3 % IJ SUSP
INTRAMUSCULAR | Status: AC
  Filled 2023-07-24: qty 20

## 2023-07-24 MED ORDER — CEFAZOLIN SODIUM-DEXTROSE 2-4 GM/100ML-% IV SOLN
INTRAVENOUS | Status: AC
  Filled 2023-07-24: qty 100

## 2023-07-24 MED ORDER — ROCURONIUM BROMIDE 10 MG/ML (PF) SYRINGE
PREFILLED_SYRINGE | INTRAVENOUS | Status: AC
Start: 2023-07-24 — End: 2023-07-24
  Filled 2023-07-24: qty 10

## 2023-07-24 MED ORDER — FENTANYL CITRATE (PF) 100 MCG/2ML IJ SOLN
INTRAMUSCULAR | Status: AC
Start: 2023-07-24 — End: 2023-07-24
  Filled 2023-07-24: qty 2

## 2023-07-24 MED ORDER — ENOXAPARIN SODIUM 40 MG/0.4ML IJ SOSY
40.0000 mg | PREFILLED_SYRINGE | INTRAMUSCULAR | Status: DC
Start: 2023-07-25 — End: 2023-07-25

## 2023-07-24 MED ORDER — ACETAMINOPHEN 500 MG PO TABS
1000.0000 mg | ORAL_TABLET | ORAL | Status: AC
  Administered 2023-07-24: 1000 mg via ORAL

## 2023-07-24 MED ORDER — TRAMADOL HCL 50 MG PO TABS: 50.0000 mg | ORAL_TABLET | Freq: Four times a day (QID) | ORAL | Status: DC | PRN

## 2023-07-24 MED ORDER — CEFAZOLIN SODIUM-DEXTROSE 2-4 GM/100ML-% IV SOLN: 2.0000 g | INTRAVENOUS | Status: DC

## 2023-07-24 MED ORDER — KETAMINE HCL 50 MG/5ML IJ SOSY
PREFILLED_SYRINGE | INTRAMUSCULAR | Status: AC
  Filled 2023-07-24: qty 5

## 2023-07-24 MED ORDER — MIDAZOLAM HCL 2 MG/2ML IJ SOLN
INTRAMUSCULAR | Status: AC
Start: 2023-07-24 — End: 2023-07-24
  Filled 2023-07-24: qty 2

## 2023-07-24 MED ORDER — OXYCODONE HCL 5 MG PO TABS
5.0000 mg | ORAL_TABLET | Freq: Once | ORAL | Status: AC | PRN
  Administered 2023-07-24: 5 mg via ORAL

## 2023-07-24 MED ORDER — HYDROMORPHONE HCL 1 MG/ML IJ SOLN
INTRAMUSCULAR | Status: AC
Start: 2023-07-24 — End: 2023-07-24
  Filled 2023-07-24: qty 1

## 2023-07-24 MED ORDER — SENNA 8.6 MG PO TABS
2.0000 | ORAL_TABLET | Freq: Every day | ORAL | Status: DC
  Administered 2023-07-24: 17.2 mg via ORAL
  Filled 2023-07-24: qty 2

## 2023-07-24 MED ORDER — FENTANYL CITRATE (PF) 100 MCG/2ML IJ SOLN
25.0000 ug | INTRAMUSCULAR | Status: DC | PRN
  Administered 2023-07-24: 50 ug via INTRAVENOUS
  Administered 2023-07-24 (×2): 25 ug via INTRAVENOUS

## 2023-07-24 MED ORDER — BUPIVACAINE HCL (PF) 0.5 % IJ SOLN
INTRAMUSCULAR | Status: AC
  Filled 2023-07-24: qty 60

## 2023-07-24 MED ORDER — CEFAZOLIN SODIUM-DEXTROSE 2-4 GM/100ML-% IV SOLN
2.0000 g | INTRAVENOUS | Status: AC
  Administered 2023-07-24: 2 g via INTRAVENOUS

## 2023-07-24 MED ORDER — GABAPENTIN 300 MG PO CAPS
300.0000 mg | ORAL_CAPSULE | Freq: Two times a day (BID) | ORAL | Status: DC
  Administered 2023-07-24: 300 mg via ORAL
  Filled 2023-07-24: qty 1

## 2023-07-24 MED ORDER — OXYCODONE HCL 5 MG PO TABS
ORAL_TABLET | ORAL | Status: AC
Start: 2023-07-24 — End: 2023-07-24
  Filled 2023-07-24: qty 1

## 2023-07-24 MED ORDER — ORAL CARE MOUTH RINSE: 15.0000 mL | Freq: Once | OROMUCOSAL | Status: AC

## 2023-07-24 MED ORDER — LIDOCAINE HCL (CARDIAC) PF 100 MG/5ML IV SOSY
PREFILLED_SYRINGE | INTRAVENOUS | Status: DC | PRN
  Administered 2023-07-24: 60 mg via INTRAVENOUS

## 2023-07-24 MED ORDER — PROPOFOL 10 MG/ML IV BOLUS
INTRAVENOUS | Status: DC | PRN
  Administered 2023-07-24: 160 mg via INTRAVENOUS

## 2023-07-24 MED ORDER — LACTATED RINGERS IV SOLN: INTRAVENOUS | Status: DC

## 2023-07-24 MED ORDER — CELECOXIB 200 MG PO CAPS
200.0000 mg | ORAL_CAPSULE | Freq: Two times a day (BID) | ORAL | Status: DC
  Administered 2023-07-24: 200 mg via ORAL
  Filled 2023-07-24 (×2): qty 1

## 2023-07-24 MED ORDER — HYDROMORPHONE HCL 1 MG/ML IJ SOLN
INTRAMUSCULAR | Status: DC | PRN
  Administered 2023-07-24 (×2): .5 mg via INTRAVENOUS

## 2023-07-24 MED ORDER — SODIUM CHLORIDE (PF) 0.9 % IJ SOLN
INTRAMUSCULAR | Status: AC
  Filled 2023-07-24: qty 50

## 2023-07-24 MED ORDER — FENTANYL CITRATE (PF) 100 MCG/2ML IJ SOLN
INTRAMUSCULAR | Status: DC | PRN
  Administered 2023-07-24 (×2): 25 ug via INTRAVENOUS
  Administered 2023-07-24: 50 ug via INTRAVENOUS

## 2023-07-24 MED ORDER — DROPERIDOL 2.5 MG/ML IJ SOLN: 0.6250 mg | Freq: Once | INTRAMUSCULAR | Status: DC | PRN

## 2023-07-24 MED ORDER — CHLORHEXIDINE GLUCONATE CLOTH 2 % EX PADS: 6.0000 | MEDICATED_PAD | Freq: Once | CUTANEOUS | Status: DC

## 2023-07-24 MED ORDER — CELECOXIB 200 MG PO CAPS
ORAL_CAPSULE | ORAL | Status: AC
  Filled 2023-07-24: qty 1

## 2023-07-24 MED ORDER — STERILE WATER FOR IRRIGATION IR SOLN
Status: DC | PRN
Start: 2023-07-24 — End: 2023-07-24
  Administered 2023-07-24: 1000 mL

## 2023-07-24 MED ORDER — ACETAMINOPHEN 500 MG PO TABS
ORAL_TABLET | ORAL | Status: AC
  Filled 2023-07-24: qty 2

## 2023-07-24 MED ORDER — ONDANSETRON HCL 4 MG/2ML IJ SOLN
INTRAMUSCULAR | Status: DC | PRN
Start: 2023-07-24 — End: 2023-07-24
  Administered 2023-07-24: 4 mg via INTRAVENOUS

## 2023-07-24 MED ORDER — GABAPENTIN 300 MG PO CAPS
300.0000 mg | ORAL_CAPSULE | ORAL | Status: AC
  Administered 2023-07-24: 300 mg via ORAL

## 2023-07-24 MED ORDER — DEXAMETHASONE SODIUM PHOSPHATE 10 MG/ML IJ SOLN
INTRAMUSCULAR | Status: DC | PRN
  Administered 2023-07-24: 5 mg via INTRAVENOUS

## 2023-07-24 MED ORDER — OXYCODONE HCL 5 MG PO TABS: 5.0000 mg | ORAL_TABLET | Freq: Four times a day (QID) | ORAL | Status: DC | PRN

## 2023-07-24 SURGICAL SUPPLY — 74 items
BAG DECANTER FOR FLEXI CONT (MISCELLANEOUS) ×1 IMPLANT
BINDER BREAST LRG (GAUZE/BANDAGES/DRESSINGS) IMPLANT
BIOPATCH WHT 1IN DISK W/4.0 H (GAUZE/BANDAGES/DRESSINGS) ×2 IMPLANT
BLADE BOVIE TIP EXT 4 (BLADE) ×1 IMPLANT
BLADE PHOTON ILLUMINATED (MISCELLANEOUS) ×1 IMPLANT
BLADE SURG 10 STRL SS (BLADE) IMPLANT
BLADE SURG 15 STRL LF DISP TIS (BLADE) ×2 IMPLANT
CHLORAPREP W/TINT 26 (MISCELLANEOUS) IMPLANT
CLEANSER WND VASHE 34 (WOUND CARE) ×1 IMPLANT
CLEANSER WND VASHE INSTL 34OZ (WOUND CARE) IMPLANT
COVER PROBE GAMMA FINDER SLV (MISCELLANEOUS) ×1 IMPLANT
DERMABOND ADVANCED .7 DNX12 (GAUZE/BANDAGES/DRESSINGS) ×3 IMPLANT
DRAIN CHANNEL JP 15F RND 3/16 (MISCELLANEOUS) IMPLANT
DRAIN CHANNEL JP 19F RND 3/16 (MISCELLANEOUS) IMPLANT
DRAPE LAPAROTOMY TRNSV 106X77 (MISCELLANEOUS) ×1 IMPLANT
DRAPE SHEET LG 3/4 BI-LAMINATE (DRAPES) IMPLANT
DRSG GAUZE FLUFF 36X18 (GAUZE/BANDAGES/DRESSINGS) ×1 IMPLANT
DRSG MEPILEX FLEX 6X6 (GAUZE/BANDAGES/DRESSINGS) ×2 IMPLANT
DRSG TEGADERM 2-3/8X2-3/4 SM (GAUZE/BANDAGES/DRESSINGS) IMPLANT
DRSG TEGADERM 4X10 (GAUZE/BANDAGES/DRESSINGS) IMPLANT
DRSG TEGADERM 4X4.75 (GAUZE/BANDAGES/DRESSINGS) IMPLANT
ELECT CAUTERY BLADE 6.4 (BLADE) ×1 IMPLANT
ELECTRODE CAUTERY BLDE TIP 2.5 (TIP) ×1 IMPLANT
ELECTRODE REM PT RTRN 9FT ADLT (ELECTROSURGICAL) ×1 IMPLANT
EVACUATOR SILICONE 100CC (DRAIN) IMPLANT
GAUZE SPONGE 4X4 12PLY STRL (GAUZE/BANDAGES/DRESSINGS) IMPLANT
GLOVE BIO SURGEON STRL SZ 6.5 (GLOVE) ×4 IMPLANT
GLOVE BIOGEL PI IND STRL 7.0 (GLOVE) ×1 IMPLANT
GLOVE SURG SYN 6.5 ES PF (GLOVE) ×3 IMPLANT
GLOVE SURG SYN 6.5 PF PI (GLOVE) ×3 IMPLANT
GOWN STRL REUS W/ TWL LRG LVL3 (GOWN DISPOSABLE) ×7 IMPLANT
IMPL EXPANDER BREAST 455CC (Breast) IMPLANT
IV NS 1000ML BAXH (IV SOLUTION) IMPLANT
IV NS 500ML BAXH (IV SOLUTION) IMPLANT
KIT FILL ASEPTIC TRANSFER (MISCELLANEOUS) ×1 IMPLANT
KIT TURNOVER KIT A (KITS) ×1 IMPLANT
LABEL OR SOLS (LABEL) ×1 IMPLANT
LIGHT WAVEGUIDE WIDE FLAT (MISCELLANEOUS) IMPLANT
MANIFOLD NEPTUNE II (INSTRUMENTS) ×2 IMPLANT
MESH OVITEX RESRB OVAL 20X25 (Mesh General) IMPLANT
NDL HYPO 22X1.5 SAFETY MO (MISCELLANEOUS) ×2 IMPLANT
NEEDLE HYPO 22X1.5 SAFETY MO (MISCELLANEOUS) ×1 IMPLANT
NS IRRIG 1000ML POUR BTL (IV SOLUTION) IMPLANT
PACK BASIN MAJOR ARMC (MISCELLANEOUS) ×1 IMPLANT
PACK BASIN MINOR ARMC (MISCELLANEOUS) ×1 IMPLANT
PACK UNIVERSAL (MISCELLANEOUS) IMPLANT
PAD ABD DERMACEA PRESS 5X9 (GAUZE/BANDAGES/DRESSINGS) ×4 IMPLANT
PIN SAFETY STRL (MISCELLANEOUS) ×1 IMPLANT
SOLUTION PREP PVP 2OZ (MISCELLANEOUS) ×2 IMPLANT
SPONGE DRAIN TRACH 4X4 STRL 2S (GAUZE/BANDAGES/DRESSINGS) IMPLANT
SPONGE T-LAP 18X18 ~~LOC~~+RFID (SPONGE) ×3 IMPLANT
STAPLER SKIN PROX 35W (STAPLE) IMPLANT
STRIP SUTURE WOUND CLOSURE 1/2 (MISCELLANEOUS) ×2 IMPLANT
SUT MNCRL 3-0 UNDYED SH (SUTURE) ×2 IMPLANT
SUT MNCRL+ 5-0 UNDYED PC-3 (SUTURE) ×2 IMPLANT
SUT PDS AB 3-0 SH 27 (SUTURE) IMPLANT
SUT PDS II 3-0 (SUTURE) ×4 IMPLANT
SUT SILK 2 0 SH (SUTURE) IMPLANT
SUT SILK 2-0 30XBRD TIE 12 (SUTURE) ×1 IMPLANT
SUT SILK 3 0 12 30 (SUTURE) IMPLANT
SUT SILK 3 0 SH 30 (SUTURE) ×2 IMPLANT
SUT VIC AB 2-0 SH 27XBRD (SUTURE) ×1 IMPLANT
SUT VIC AB 3-0 SH 27X BRD (SUTURE) IMPLANT
SUTURE EHLN 3-0 FS-10 30 BLK (SUTURE) IMPLANT
SUTURE MNCRL 4-0 27XMF (SUTURE) ×2 IMPLANT
SYR 10ML LL (SYRINGE) ×1 IMPLANT
SYR 20ML LL LF (SYRINGE) ×2 IMPLANT
SYR 50ML LL SCALE MARK (SYRINGE) IMPLANT
SYR BULB IRRIG 60ML STRL (SYRINGE) ×2 IMPLANT
TOWEL OR 17X26 4PK STRL BLUE (TOWEL DISPOSABLE) IMPLANT
TRAP FLUID SMOKE EVACUATOR (MISCELLANEOUS) ×2 IMPLANT
TRAY FOLEY MTR SLVR 16FR STAT (SET/KITS/TRAYS/PACK) IMPLANT
WATER STERILE IRR 1000ML POUR (IV SOLUTION) ×1 IMPLANT
WATER STERILE IRR 500ML POUR (IV SOLUTION) ×2 IMPLANT

## 2023-07-24 NOTE — Discharge Instructions (Signed)

## 2023-07-24 NOTE — Op Note (Signed)
 Preoperative diagnosis: Left breast Cancer, BRCA positive  Postoperative diagnosis: same.   Procedure: Bilateral simple mastectomy, left SLNB  Anesthesia: GETA  Surgeon: Dr. Rosea Conch  Wound Classification: Clean  Specimen: Left breast, left sentinel lymph node x 1, right breast  Complications: None  Estimated Blood Loss: 75 mL   Indications: Patient is a 38 y.o. female history of left-sided breast cancer status post neoadjuvant therapy, also positive for BRCA.  Presenting today to proceed with bilateral mastectomy and left sentinel lymph node biopsy  Findings: No obvious persistent lesion has confirmed on preoperative MRI and left breast, no concerning lymph nodes palpable in the left axilla 2.   No obvious pathology in right breast  Operation performed with curative intent:Yes  Tracer(s) used to identify sentinel nodes in the upfront surgery (non-neoadjuvant) setting (select all that apply):N/A  Tracer(s) used to identify sentinel nodes in the neoadjuvant setting (select all that apply):Dye  All nodes (colored or non-colored) present at the end of a dye-filled lymphatic channel were removed:N/A  All significantly radioactive nodes were removed:Yes  All palpable suspicious nodes were removed:N/A  Biopsy-proven positive nodes marked with clips prior to chemotherapy were identified and removed:N/A   Description of procedure: The patient was brought to the operating room and general anesthesia was induced. A time-out was completed verifying correct patient, procedure, site, positioning, and implant(s) and/or special equipment prior to beginning this procedure. The breast, chest wall, axilla, and upper arm and neck were prepped and draped in the usual sterile fashion.  Left side addressed first.  A skin incision was made that encompassed the nipple-areola complex and passed in an oblique direction across the breast.  Hand-held gamma probe was used to identify the location of the  hottest spot in the axilla, thorough the lateral edge of the original incision.  Sharp and blunt Dissection was carried down to subdermal facias. The probe was placed within wound and again, the point of maximal count was found. Dissection continue until nodule was identified. The probe was placed in contact with the node and 800 counts were recorded. The node was excised in its entirety. Ex vivo, the node measured 850 counts when placed on the probe. The bed of the node <100 counts. No additional hot spots were identified. No clinically abnormal nodes were palpated.  No send for routine per pathology.  Skin flaps were raised in the avascular plane between subcutaneous tissue and breast tissue from the clavicle superiorly, the sternum medially, the anterior rectus sheath inferiorly, and past the lateral border of the pectoralis major muscle laterally. Hemostasis was achieved in the flaps. Next, the breast tissue and underlying pectoralis fascia were excised from the pectoralis major muscle, progressing from medially to laterally. At the lateral border of the pectoralis major muscle, the breast tissue was swung laterally and a lateral pedicle identified where breast tissue gave way to fat of axilla. The lateral pedicle was incised and the specimen removed.   Specimen marked long lateral, short superior and sent to pathology. The wound was irrigated and hemostasis was achieved.   Right side addressed next.  A skin incision was made that encompassed the nipple-areola complex in an oblique direction across the breast.  Skin flaps were raised in the avascular plane between subcutaneous tissue and breast tissue from the clavicle superiorly, the sternum medially, the anterior rectus sheath inferiorly, and past the lateral border of the pectoralis major muscle laterally. Hemostasis was achieved in the flaps. Next, the breast tissue and underlying pectoralis fascia were  excised from the pectoralis major muscle,  progressing from medially to laterally. At the lateral border of the pectoralis major muscle, the breast tissue was swung laterally and a lateral pedicle identified where breast tissue gave way to fat of axilla. The lateral pedicle was incised and the specimen removed.   Specimen marked long lateral, short superior and sent to pathology. The wound was irrigated and hemostasis was achieved.   Procedure then transferred over to Dr. Orin Birk for her portion of the procedure.  Please see her op note for further details.  The patient tolerated the procedure well and was taken to the postanesthesia care unit in stable condition.  Sponge instrument count correct at end of procedure.

## 2023-07-24 NOTE — Interval H&P Note (Signed)
 History and Physical Interval Note:  07/24/2023 11:45 AM  Penny Gomez  has presented today for surgery, with the diagnosis of C50.912, Z15.01  Breast Cancer    BRCA2 positive , left.  The various methods of treatment have been discussed with the patient and family. After consideration of risks, benefits and other options for treatment, the patient has consented to  Procedure(s) with comments: MASTECTOMY WITH SENTINEL LYMPH NODE BIOPSY (Bilateral) - SN biopsy left only RECONSTRUCTION, BREAST (Bilateral) - BILATERAL IMMEDIATE BREAST RECONSTRUCTION WITH EXPANDER AND FLEX HD PLACEMENT as a surgical intervention.  The patient's history has been reviewed, patient examined, no change in status, stable for surgery.  I have reviewed the patient's chart and labs.  Questions were answered to the patient's satisfaction.     Montravious Weigelt Rosea Conch

## 2023-07-24 NOTE — Transfer of Care (Signed)
 Immediate Anesthesia Transfer of Care Note  Patient: Penny Gomez  Procedure(s) Performed: MASTECTOMY WITH SENTINEL LYMPH NODE BIOPSY (Bilateral: Breast) RECONSTRUCTION, BREAST (Bilateral: Breast)  Patient Location: PACU  Anesthesia Type:General  Level of Consciousness: awake, drowsy, and patient cooperative  Airway & Oxygen Therapy: Patient Spontanous Breathing and Patient connected to face mask oxygen  Post-op Assessment: Report given to RN and Post -op Vital signs reviewed and stable  Post vital signs: Reviewed and stable  Last Vitals:  Vitals Value Taken Time  BP 109/66 07/24/23 17:30  Temp    Pulse 85 07/24/23 17:34  Resp 17 07/24/23 17:34  SpO2 100 % 07/24/23 17:34  Vitals shown include unfiled device data.  Last Pain:  Vitals:   07/24/23 1056  TempSrc:   PainSc: 0-No pain         Complications: No notable events documented.

## 2023-07-24 NOTE — Anesthesia Procedure Notes (Signed)
 Procedure Name: Intubation Date/Time: 07/24/2023 11:58 AM  Performed by: Bill Budd, CRNAPre-anesthesia Checklist: Patient identified, Patient being monitored, Timeout performed, Emergency Drugs available and Suction available Patient Re-evaluated:Patient Re-evaluated prior to induction Oxygen Delivery Method: Circle system utilized Preoxygenation: Pre-oxygenation with 100% oxygen Induction Type: IV induction Ventilation: Mask ventilation without difficulty Laryngoscope Size: 3 and McGrath Grade View: Grade I Tube type: Oral Tube size: 7.0 mm Number of attempts: 1 Airway Equipment and Method: Stylet Placement Confirmation: ETT inserted through vocal cords under direct vision, positive ETCO2 and breath sounds checked- equal and bilateral Secured at: 21 cm Tube secured with: Tape Dental Injury: Teeth and Oropharynx as per pre-operative assessment

## 2023-07-24 NOTE — Progress Notes (Signed)
 Patient arrived to unit with PACU nurse. Bedside report given. Patient is A&Ox4 appears to be drowsy but easily aroused. Patient denies any pain at this time. VSS. Remains on RA with O2 saturation 99%. Bowel sounds active, denies any tenderness or pain. Foley was D/C downstairs. Education provided to patient for voiding trial. R arm PIV intact and flushed. JP drains remain intact with no drainage at this time. Surgical dressing clean, dry, and intact. Otherwise patient has not other skin issues. Family at bedside.

## 2023-07-24 NOTE — H&P (Signed)
 ubjective:   CC: Breast cancer, BRCA2 positive, left (CMS/HHS-HCC) [C50.912, Z15.01]  HPI: Penny Gomez is a 38 y.o. female who returns for evaluation of above. Finished chemo last week, ready to discuss bilateral mastectomy for above.  Triple negative breast CA. Most recent MRI with resolution as noted below.  Past Medical History: has a past medical history of Breast cancer (CMS/HHS-HCC).  Past Surgical History: has a past surgical history that includes Cesarean section and port-a-cath placement (02/10/2023).  Family History: family history includes Alcohol abuse in her father; Breast cancer in her maternal aunt and mother; Diabetes in her father, maternal aunt, and mother; High blood pressure (Hypertension) in her brother, maternal aunt, and mother.  Social History: reports that she has never smoked. She has never used smokeless tobacco. She reports that she does not currently use alcohol. No history on file for drug use.  Current Medications: has a current medication list which includes the following prescription(s): apixaban  and sennosides.  Allergies:  Allergies as of 05/19/2023  (No Known Allergies)   ROS:  A 15 point review of systems was performed and was negative except as noted in HPI  Objective:    BP 97/61  Pulse 88  Ht 160 cm (5' 3)  Wt 69.4 kg (153 lb)  LMP 05/09/2023  BMI 27.10 kg/m   Constitutional : No distress, cooperative, alert  Lymphatics/Throat: Supple with no lymphadenopathy  Respiratory: Clear to auscultation bilaterally  Cardiovascular: Regular rate and rhythm  Gastrointestinal: Soft, non-tender, non-distended, no organomegaly.  Musculoskeletal: Steady gait and movement  Skin: Cool and moist  Psychiatric: Normal affect, non-agitated, not confused     LABS:  N/a   RADS: CLINICAL DATA: 38 year old female with biopsy proven triple  negative left breast cancer. Assess response to neoadjuvant  chemotherapy. Patient desires mastectomy.  Previous MRI dated  10/17/2022 demonstrated 6.7 cm of abnormal enhancement in the  lower-inner quadrant of the left breast.   EXAM:  BILATERAL BREAST MRI WITH AND WITHOUT CONTRAST   TECHNIQUE:  Multiplanar, multisequence MR images of both breasts were obtained  prior to and following the intravenous administration of 6 ml of  Gadavist    Three-dimensional MR images were rendered by post-processing of the  original MR data on an independent workstation. The  three-dimensional MR images were interpreted, and findings are  reported in the following complete MRI report for this study. Three  dimensional images were evaluated at the independent interpreting  workstation using the DynaCAD thin client.   COMPARISON: Previous exam(s) including MRI dated 10/17/2022.   FINDINGS:  Breast composition: c. Heterogeneous fibroglandular tissue.   Background parenchymal enhancement: Mild   Right breast: No mass or abnormal enhancement.   Left breast: There is a signal void artifact in the lower-inner  quadrant of the left breast from the biopsy clip. There is no mass  or abnormal enhancement. Previously seen enhancement spanning 6.7 cm  has resolved.   Lymph nodes: No abnormal appearing lymph nodes.   Ancillary findings: None.   IMPRESSION:  No abnormal enhancement in the left breast consistent with response  to neoadjuvant chemotherapy.   RECOMMENDATION:  Continued treatment planning of the known triple negative left  breast cancer is recommended.   BI-RADS CATEGORY 6: Known biopsy-proven malignancy.   Electronically Signed  By: Dina Arceo M.D.  On: 03/10/2023 10:37   Assessment:   Breast cancer, BRCA2 positive, left (CMS/HHS-HCC) [C50.912, Z15.01]  Plan:    1. Breast cancer, BRCA2 positive, left (CMS/HHS-HCC) [C50.912, Z15.01] Discussed the  risk of surgery including recurrence, chronic pain, post-op infxn, poor/delayed wound healing, poor cosmesis, seroma, hematoma  formation, and possible re-operation to address said risks. The risks of general anesthetic, if used, includes MI, CVA, sudden death or even reaction to anesthetic medications also discussed.  Typical post-op recovery time and possbility of activity restrictions were also discussed. Alternatives include continued observation. Benefits include possible symptom relief, pathologic evaluation, and/or curative excision.   The patient verbalized understanding and all questions were answered to the patient's satisfaction.  2. Patient has elected to proceed with surgical treatment. Procedure will be scheduled after discussion with plastics. Bilateral mastectomy and left SLNB from my standpoint. Will need 24hr observation post op. Also hold eliquis  2days prior   labs/images/medications/previous chart entries reviewed personally and relevant changes/updates noted above.

## 2023-07-24 NOTE — Anesthesia Postprocedure Evaluation (Signed)
 Anesthesia Post Note  Patient: KRISTIANNA SAPERSTEIN  Procedure(s) Performed: MASTECTOMY WITH SENTINEL LYMPH NODE BIOPSY (Bilateral: Breast) RECONSTRUCTION, BREAST (Bilateral: Breast)  Patient location during evaluation: PACU Anesthesia Type: General Level of consciousness: awake and alert Pain management: pain level controlled Vital Signs Assessment: post-procedure vital signs reviewed and stable Respiratory status: spontaneous breathing, nonlabored ventilation, respiratory function stable and patient connected to nasal cannula oxygen Cardiovascular status: blood pressure returned to baseline and stable Postop Assessment: no apparent nausea or vomiting Anesthetic complications: no   No notable events documented.   Last Vitals:  Vitals:   07/24/23 1750 07/24/23 1755  BP:    Pulse: 81 83  Resp: 18 17  Temp:    SpO2: 100% 99%    Last Pain:  Vitals:   07/24/23 1755  TempSrc:   PainSc: 6                  Nancey Awkward

## 2023-07-24 NOTE — Anesthesia Preprocedure Evaluation (Addendum)
 Anesthesia Evaluation  Patient identified by MRN, date of birth, ID band Patient awake    Reviewed: Allergy & Precautions, H&P , NPO status , Patient's Chart, lab work & pertinent test results  Airway Mallampati: II  TM Distance: >3 FB Neck ROM: full    Dental no notable dental hx.    Pulmonary  H/o PE on eliquis    Pulmonary exam normal        Cardiovascular negative cardio ROS Normal cardiovascular exam     Neuro/Psych negative neurological ROS  negative psych ROS   GI/Hepatic negative GI ROS, Neg liver ROS,,,  Endo/Other  negative endocrine ROS    Renal/GU      Musculoskeletal   Abdominal   Peds  Hematology negative hematology ROS (+)   Anesthesia Other Findings Past Medical History: No date: Acute pulmonary embolism (HCC) No date: BRCA1 gene mutation positive No date: Breast lump No date: Chronic anticoagulation No date: Endocarditis No date: Fibroid, uterine No date: 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) No date: Microcytic anemia No date: MSSA bacteremia No date: Multifocal pneumonia No date: Sepsis (HCC)     Comment:  due to port a cath No date: STD (sexually transmitted disease)     Comment:  From Medical Hx; No date: Thrombocytopenia (HCC) No date: Triple negative breast cancer (HCC)     Comment:  Left No date: Vascular port complication  Past Surgical History: 05/20/2019: BREAST BIOPSY; Right     Comment:  Affirm Bx- X- clip, neg 10/02/2022: BREAST BIOPSY; Left     Comment:  US  Bx, path pending 10/02/2022: BREAST BIOPSY; Left     Comment:  Us  Bx Node- path pending 10/02/2022: BREAST BIOPSY; Left     Comment:  US  LT BREAST BX W LOC DEV 1ST LESION IMG BX SPEC US                GUIDE 10/02/2022 [facility] No date: CESAREAN SECTION     Comment:  x 2 02/09/2022: LAPAROSCOPIC BILATERAL SALPINGECTOMY; Bilateral     Comment:  Procedure: LAPAROSCOPIC BILATERAL SALPINGECTOMY;                 Surgeon: Teresa Fender, MD;  Location: [facility];                Service: Gynecology;  Laterality: Bilateral; 10/17/2022: PORTACATH PLACEMENT; Right     Comment:  Procedure: INSERTION PORT-A-CATH;  Surgeon: Emmalene Hare, MD;  Location: [facility];  Service: General;                Laterality: Right; 02/10/2023: PORTACATH PLACEMENT; Left     Comment:  Procedure: INSERTION PORT-A-CATH;  Surgeon: Conrado Delay, DO;  Location: [facility];  Service: General;                Laterality: Left; 01/02/2023: TEE WITHOUT CARDIOVERSION; N/A     Comment:  Procedure: TRANSESOPHAGEAL ECHOCARDIOGRAM (TEE);                Surgeon: Devorah Fonder, MD;  Location: [facility];                Service: Cardiovascular;  Laterality: N/A; No date: TUBAL LIGATION     Reproductive/Obstetrics negative OB ROS  Anesthesia Physical Anesthesia Plan  ASA: 2  Anesthesia Plan: General LMA   Post-op Pain Management: Tylenol  PO (pre-op)*, Gabapentin  PO (pre-op)* and Celebrex  PO (pre-op)*   Induction: Intravenous  PONV Risk Score and Plan: Dexamethasone , Ondansetron  and Midazolam   Airway Management Planned: LMA  Additional Equipment:   Intra-op Plan:   Post-operative Plan: Extubation in OR  Informed Consent: I have reviewed the patients History and Physical, chart, labs and discussed the procedure including the risks, benefits and alternatives for the proposed anesthesia with the patient or authorized representative who has indicated his/her understanding and acceptance.     Dental Advisory Given  Plan Discussed with: Anesthesiologist, CRNA and Surgeon  Anesthesia Plan Comments:          Anesthesia Quick Evaluation

## 2023-07-24 NOTE — Op Note (Signed)
 Op report    DATE OF OPERATION:  07/24/2023  LOCATION: [facility]  SURGICAL DIVISION: Plastic Surgery  PREOPERATIVE DIAGNOSES:  1. Left Breast cancer.    POSTOPERATIVE DIAGNOSES:  1. Left Breast cancer.   PROCEDURE:  1. Bilateral immediate breast reconstruction with placement of Ovitex and tissue expanders.  SURGEON: Gilles Lacks, DO  ASSISTANT: Mariel Shope, PA  ANESTHESIA:  General.   COMPLICATIONS: None.   IMPLANTS: Left - Mentor 535 cc. Ref #[id number].  250 cc of injectable saline placed in the expander. Right - Mentor 535 cc. Ref #[id number].  250 cc of injectable saline placed in the expander. Ovitex 20 x 35 cm two  INDICATIONS FOR PROCEDURE:  The patient, Penny Gomez, is a 38 y.o. female born on 1985/12/29, is here for immediate first stage breast reconstruction with placement of bilateral tissue expander and Acellular dermal matrix. MRN: [id number]  CONSENT:  Informed consent was obtained directly from the patient. Risks, benefits and alternatives were fully discussed. Specific risks including but not limited to bleeding, infection, hematoma, seroma, scarring, pain, implant infection, implant extrusion, capsular contracture, asymmetry, wound healing problems, and need for further surgery were all discussed. The patient did have an ample opportunity to have her questions answered to her satisfaction.   DESCRIPTION OF PROCEDURE:  The patient was taken to the operating room by the general surgery team. SCDs were placed and IV antibiotics were given. The patient's chest was prepped and draped in a sterile fashion. A time out was performed and the implants to be used were identified.  Bilateral mastectomies were performed.  Once the general surgery team had completed their portion of the case the patient was rendered to the plastic and reconstructive surgery team.  Right:  The pectoralis major muscle and soft tissue was freed about 1 cm in all  directions to make room for the Ovitex.  The pocket was irrigated with Vashe and hemostasis was achieved with electrocautery.  The Ovitex was then prepared according to the manufacture guidelines. It was then placed on the pectoralis muscle and sutured into place with the 3-0 PDS at the edges of the muscle.  The lateral aspect was left open and the expander was placed under the Ovitex.  The expander was prepared according to the manufacture guidelines, some of the air evacuated and then it was placed under the Ovitex. The tabs were used to suture the expander in place in two placed.  The lateral aspect was then sutured in place with the 3-0 PDS.   Two drains were placed at the inframammary fold over the Ovitex.  The lateral one is superior and the medial one is inferior. They were secured to the skin with 3-0 Silk.   The deep layers were closed with 3-0 PDS followed by 4-0 Monocryl.  Dermabond was applied.  The ABDs and breast binder were placed.  The patient tolerated the procedure well and there were no complications.  The patient was allowed to wake from anesthesia and taken to the recovery room in satisfactory condition.   Left:  The pectoralis major muscle and soft tissue was freed about 1 cm in all directions to make room for the Ovitex.  The pocket was irrigated with Vashe and hemostasis was achieved with electrocautery.  The Ovitex was then prepared according to the manufacture guidelines. It was then placed on the pectoralis muscle and sutured into place with the 3-0 PDS at the edges of the muscle.  The lateral aspect was left open  and the expander was placed under the Ovitex.  The expander was prepared according to the manufacture guidelines, some of the air evacuated and then it was placed under the Ovitex. The tabs were used to suture the expander in place in two placed.  The lateral aspect was then sutured in place with the 3-0 PDS.   Two drains were placed at the inframammary fold over the Ovitex.   The lateral one is superior and the medial one is inferior. They were secured to the skin with 3-0 Silk.   The deep layers were closed with 3-0 PDS followed by 4-0 Monocryl.  Dermabond was applied.  The ABDs and breast binder were placed.  The patient tolerated the procedure well and there were no complications.  The patient was allowed to wake from anesthesia and taken to the recovery room in satisfactory condition.   The advanced practice practitioner (APP) assisted throughout the case.  The APP was essential in retraction and counter traction when needed to make the case progress smoothly.  This retraction and assistance made it possible to see the tissue plans for the procedure.  The assistance was needed for blood control, tissue re-approximation and assisted with closure of the incision site.

## 2023-07-25 ENCOUNTER — Other Ambulatory Visit: Payer: Self-pay

## 2023-07-25 ENCOUNTER — Ambulatory Visit: Admitting: Cardiology

## 2023-07-25 ENCOUNTER — Encounter: Payer: Self-pay | Admitting: Surgery

## 2023-07-25 DIAGNOSIS — C50912 Malignant neoplasm of unspecified site of left female breast: Secondary | ICD-10-CM | POA: Diagnosis not present

## 2023-07-25 LAB — CBC
HCT: 36.3 % (ref 36.0–46.0)
Hemoglobin: 11.7 g/dL — ABNORMAL LOW (ref 12.0–15.0)
MCH: 29.8 pg (ref 26.0–34.0)
MCHC: 32.2 g/dL (ref 30.0–36.0)
MCV: 92.4 fL (ref 80.0–100.0)
Platelets: 168 10*3/uL (ref 150–400)
RBC: 3.93 MIL/uL (ref 3.87–5.11)
RDW: 12 % (ref 11.5–15.5)
WBC: 8.8 10*3/uL (ref 4.0–10.5)
nRBC: 0 % (ref 0.0–0.2)

## 2023-07-25 LAB — BASIC METABOLIC PANEL WITH GFR
Anion gap: 4 — ABNORMAL LOW (ref 5–15)
BUN: 7 mg/dL (ref 6–20)
CO2: 27 mmol/L (ref 22–32)
Calcium: 8.9 mg/dL (ref 8.9–10.3)
Chloride: 109 mmol/L (ref 98–111)
Creatinine, Ser: 0.53 mg/dL (ref 0.44–1.00)
GFR, Estimated: >60 mL/min (ref >60)
Glucose, Bld: 114 mg/dL — ABNORMAL HIGH (ref 70–99)
Potassium: 3.9 mmol/L (ref 3.5–5.1)
Sodium: 140 mmol/L (ref 135–145)

## 2023-07-25 NOTE — Plan of Care (Signed)

## 2023-07-25 NOTE — TOC CM/SW Note (Signed)
 Transition of Care [facility]) - Inpatient Brief Assessment   Patient Details  Name: Penny Gomez MRN: [id number] Date of Birth: 01-May-1985  Transition of [facility] CM/SW Contact:    Odilia Bennett, LCSW Phone Number: 07/25/2023, 9:01 AM   Clinical Narrative: Patient has orders to discharge home today. Chart reviewed. No TOC needs identified. CSW signing off.  Transition of Care Asessment: Insurance and Status: Insurance coverage has been reviewed Patient has primary care physician: Yes Home environment has been reviewed: Single family home Prior level of function:: Not documented Prior/Current Home Services: No current home services Social Drivers of Health Review: SDOH reviewed no interventions necessary Readmission risk has been reviewed: Yes Transition of care needs: no transition of care needs at this time

## 2023-07-25 NOTE — Discharge Summary (Signed)
 [facility]-General Surgery  SURGICAL DISCHARGE SUMMARY  Patient ID: MILITZA DEVERY MRN: [id number] DOB/AGE: [birth year] 37 y.o.  Admit date: 07/24/2023 Discharge date: 07/25/2023  Discharge Diagnoses Patient Active Problem List   Diagnosis Date Noted   Breast cancer (HCC) 07/24/2023   Palpitation 04/25/2023   Bloodstream infection due to Port-A-Cath 01/02/2023   Endocarditis 01/02/2023   Thrombocytopenia (HCC) 01/01/2023   Anemia 01/01/2023   Bacteremia 12/31/2022   Sepsis (HCC) 12/31/2022   Acute febrile illness 12/30/2022   Hypokalemia 12/06/2022   Acute pulmonary embolism (HCC) 11/24/2022   Vascular port complication 11/24/2022   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) 11/24/2022   Multifocal pneumonia 11/24/2022   Pulmonary infarct (HCC) 11/24/2022   Genetic testing 11/11/2022   BRCA1 gene mutation positive 11/11/2022   Encounter for antineoplastic chemotherapy 10/25/2022   Port-A-Cath in place 10/25/2022   Invasive carcinoma of breast (HCC) 10/10/2022   Goals of care, counseling/discussion 10/10/2022   Family history of cancer 10/10/2022   IDA (iron  deficiency anemia) 10/10/2022   Urinary frequency 03/19/2022    Consultants Dr. Orin Birk with plastic surgery  Procedures Bilateral simple mastectomy, left SLNB with bilateral immediate breast reconstruction   Hospital Course:  Patient was taken to the operating room on 07/24/23 for bilateral simple mastectomy with immediate breast reconstruction. Surgery went well. Patient tolerated procedure and there were no complications.  Patient is now tolerating diet post-op and is able to ambulate.  Pain is well-controlled with pain medication. Patient is clear from surgical standpoint.  Patient will follow-up outpatient with Dr. Rosea Conch in 2 weeks.     Physical Examination:  Constitutional: alert, in no acute distress Skin: The four JP drains have minimal serosanguinous output. Incisions are covered with Mepilex  dressing. Breast binder is in place with fluff gauze underneath.    Allergies as of 07/25/2023   No Known Allergies      Medication List     TAKE these medications    acetaminophen  500 MG tablet Commonly known as: TYLENOL  Take 2 tablets (1,000 mg total) by mouth every 6 (six) hours as needed for mild pain.   apixaban  5 MG Tabs tablet Commonly known as: Eliquis  Take 1 tablet (5 mg total) by mouth 2 (two) times daily.   diazepam  2 MG tablet Commonly known as: Valium  Take 1 tablet (2 mg total) by mouth every 12 (twelve) hours as needed for up to 20 doses for muscle spasms.   lidocaine -prilocaine  cream Commonly known as: EMLA  Apply 1 Application topically as needed.   ondansetron  4 MG tablet Commonly known as: Zofran  Take 1 tablet (4 mg total) by mouth every 8 (eight) hours as needed for up to 20 doses for nausea or vomiting.   oxyCODONE  5 MG immediate release tablet Commonly known as: Roxicodone  Take 1 tablet (5 mg total) by mouth every 6 (six) hours as needed for up to 20 doses for severe pain (pain score 7-10).   senna 8.6 MG Tabs tablet Commonly known as: SENOKOT Take 2 tablets (17.2 mg total) by mouth daily.   simethicone  80 MG chewable tablet Commonly known as: MYLICON Chew 1 tablet (80 mg total) by mouth every 6 (six) hours as needed for flatulence.          Follow-up Information     Dillingham, Lindaann Requena, DO Follow up in 10 day(s).   Specialty: Plastic Surgery Contact information: [address] [place] [address] [phone]         Conrado Delay, DO Follow  up in 2 week(s).   Specialties: General Surgery, Surgery Why: 2 week follow-up for bilateral simple mastectomy, left SLNB Contact information: [address] [place] [address] [phone]                  Time spent on discharge management including discussion of hospital course, clinical condition, outpatient instructions, prescriptions, and follow up with the  patient and members of the medical team: >30 minutes  Ellina Sivertsen Barrientos PA-C

## 2023-07-28 ENCOUNTER — Encounter: Payer: Self-pay | Admitting: *Deleted

## 2023-07-28 LAB — SURGICAL PATHOLOGY

## 2023-07-30 ENCOUNTER — Encounter: Admitting: Physician Assistant

## 2023-07-30 ENCOUNTER — Encounter: Payer: Self-pay | Admitting: Surgery

## 2023-08-01 NOTE — Progress Notes (Unsigned)
 Patient is a pleasant 38 year old female s/p bilateral mastectomy with immediate tissue expander reconstruction performed 07/24/2023 by Dr. Orin Birk who returns to clinic for postoperative follow-up.  Reviewed operative report and a total of 250 cc was injected into each of the 535 cc tissue expanders.  Today,

## 2023-08-04 ENCOUNTER — Ambulatory Visit (INDEPENDENT_AMBULATORY_CARE_PROVIDER_SITE_OTHER): Admitting: Physician Assistant

## 2023-08-04 DIAGNOSIS — C50919 Malignant neoplasm of unspecified site of unspecified female breast: Secondary | ICD-10-CM

## 2023-08-04 DIAGNOSIS — Z9889 Other specified postprocedural states: Secondary | ICD-10-CM

## 2023-08-08 ENCOUNTER — Inpatient Hospital Stay: Attending: Oncology | Admitting: Oncology

## 2023-08-08 ENCOUNTER — Inpatient Hospital Stay

## 2023-08-08 VITALS — BP 108/67 | HR 77 | Temp 96.0°F | Resp 18 | Wt 150.6 lb

## 2023-08-08 DIAGNOSIS — Z95828 Presence of other vascular implants and grafts: Secondary | ICD-10-CM

## 2023-08-08 DIAGNOSIS — D500 Iron deficiency anemia secondary to blood loss (chronic): Secondary | ICD-10-CM

## 2023-08-08 DIAGNOSIS — I2699 Other pulmonary embolism without acute cor pulmonale: Secondary | ICD-10-CM | POA: Diagnosis not present

## 2023-08-08 DIAGNOSIS — C50919 Malignant neoplasm of unspecified site of unspecified female breast: Secondary | ICD-10-CM

## 2023-08-08 DIAGNOSIS — Z1501 Genetic susceptibility to malignant neoplasm of breast: Secondary | ICD-10-CM

## 2023-08-08 DIAGNOSIS — Z452 Encounter for adjustment and management of vascular access device: Secondary | ICD-10-CM | POA: Diagnosis present

## 2023-08-08 DIAGNOSIS — C50312 Malignant neoplasm of lower-inner quadrant of left female breast: Secondary | ICD-10-CM | POA: Insufficient documentation

## 2023-08-08 DIAGNOSIS — Z1509 Genetic susceptibility to other malignant neoplasm: Secondary | ICD-10-CM

## 2023-08-08 MED ORDER — HEPARIN SOD (PORK) LOCK FLUSH 100 UNIT/ML IV SOLN
500.0000 [IU] | Freq: Once | INTRAVENOUS | Status: AC
  Administered 2023-08-08: 500 [IU] via INTRAVENOUS
  Filled 2023-08-08: qty 5

## 2023-08-11 NOTE — Progress Notes (Unsigned)
 Patient is a pleasant 38 year old female s/p bilateral mastectomy with immediate tissue expander reconstruction performed 07/24/2023 by Dr. Lowery who returns to clinic for postoperative follow-up.   She was last seen here in clinic on 08/04/2023.  At that time, drain output had been consistently 50 cc from each side daily.  She also was experiencing discomfort medially on the left reconstructed breast feeling tight, but different from a muscle spasm.  She had not yet required any oxycodone  and was otherwise doing okay from a pain standpoint.  Exam was largely benign.  Small amount of serous drainage noted on the left side.  Labeled all 4 of her drains, likely remove 1 from each side at subsequent encounter.  Could consider expansion at that time.  Today,

## 2023-08-11 NOTE — Assessment & Plan Note (Addendum)
 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  S/p neoadjuvant chemotherapy with Carboplatin , taxol  Keytruda  Q3 weeks x 4 cycle  followed by [facility] + Keytrua with D4 GCSF x 4 cycles.  S/p bilateral mastectomy, no residual disease.  Patient has BRCA 1 mutation, no residual disease, does not qualify Olarparib eligibility.  Recommend adjuvant Keytrya every 3 weeks for 9 cycles.  Refer to Radonc for evaluation of need of post mastectomy radiation

## 2023-08-11 NOTE — Progress Notes (Signed)
 Hematology/Oncology Progress note [phone] [phone]       CHIEF COMPLAINTS/PURPOSE OF CONSULTATION:  Left triple negative breast cancer.   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  S/p neoadjuvant chemotherapy with Carboplatin , taxol  Keytruda  Q3 weeks x 4 cycle  followed by [facility] + Keytrua with D4 GCSF x 4 cycles.  S/p bilateral mastectomy, no residual disease.  Patient has BRCA 1 mutation, no residual disease, does not qualify Olarparib eligibility.  Recommend adjuvant Keytrya every 3 weeks for 9 cycles.  Refer to Radonc for evaluation of need of post mastectomy radiation    IDA (iron  deficiency anemia) Labs are reviewed and discussed with patient. Lab Results  Component Value Date   HGB 11.7 (L) 07/25/2023   TIBC 377 02/28/2023   IRONPCTSAT 11 02/28/2023   FERRITIN 23 02/28/2023   Hemoglobin is stable.   BRCA1 gene mutation positive Once she finished treatment of breast cancer, recommend risk-reducing salpingo oophorectomy  pancreatic screening at age 52 (or 38 years younger than the earliest exocrine pancreatic cancer diagnosis in the family, whichever is earlier)  Family members are encouraged to consider genetic testing for this familial pathogenic variant.   Acute pulmonary embolism (HCC) Likely provoked, continue Eliquis  5mg  BID, plan 6 months- she has finished.     Orders Placed This Encounter  Procedures   Ambulatory referral to Radiation Oncology    Referral Priority:   Routine    Referral Type:   Consultation    Referral Reason:   Specialty Services Required    Requested Specialty:   Radiation Oncology    Number of Visits Requested:   1   All questions were  answered. The patient knows to call the clinic with any problems, questions or concerns.  Call Babara, MD, PhD [facility] Hematology Oncology 08/08/2023    HISTORY OF PRESENTING ILLNESS:  Penny Gomez 38 y.o. female presents to establish care for left triple negative breast cancer I have reviewed her chart and materials related to her cancer extensively and collaborated history with the patient. Summary of oncologic history is as follows: Oncology History  Invasive carcinoma of breast (HCC)  07/24/2022 Mammogram   She noticed left breast mass for 1 month.  Bilateral diagnostic mammogram showed Suspicious palpable left breast mass 8 o'clock position. Cortically thickened left axillary lymph node   10/10/2022 Initial Diagnosis   Invasive carcinoma of breast (HCC)  10/02/2022  Left breast mass biopsy and left axillary lymph node biopsy.  Diagnosis 1. Breast, left, needle core biopsy, 8 o'clock 6 cmfn, heart clip - INVASIVE MAMMARY CARCINOMA, NO SPECIAL TYPE. - TUBULE FORMATION: SCORE 3 - NUCLEAR PLEOMORPHISM: SCORE 3 - MITOTIC COUNT: SCORE 3 - TOTAL SCORE: 9 - OVERALL GRADE: 3 - LYMPHOVASCULAR INVASION: NOT IDENTIFIED - CANCER LENGTH: 11 MM - CALCIFICATIONS: NOT IDENTIFIED - DUCTAL CARCINOMA IN SITU: PRESENT, HIGH-GRADE - ER-, PR- HER2 - [IHC 1+] 2. Lymph node, needle/core biopsy, left axillary, hydromark - LYMPH NODE WITH REACTIVE CHANGES; NEGATIVE FOR MALIGNANCY.  Menarche at age of 54 First live birth at age of 51 OCP use: no History of hysterectomy: no Menopausal status: premenopausal History of HRT use: no History of chest radiation: no Number of previous breast biopsies:  right breast biopsy 05/20/2019 negative for malignancy Strong family history of cancer mother breast cancer diagnosed in 38s, maternal aunts x 2 breast cancer, maternal cousins breast cancer x 2, maternal grandmother pancreatic cancer.    10/10/2022 Cancer Staging   Staging form: Breast, AJCC 8th  Edition - Clinical stage from 10/10/2022: Stage IIIB (cT3, cN0, cM0, G3, ER-, PR-, HER2-) - Signed by Babara Call, MD on 10/18/2022 Stage prefix: Initial diagnosis Nuclear grade: G3 Histologic grading system: 3 grade system   10/17/2022 Imaging   Bilateral MRI breasts w wo contrast  1. In total, there is approximately 6.7 cm of abnormal enhancement, predominantly in the lower-inner inferior left breast, with the ultrasound-guided biopsy marking clip centrally positioned within the enhancement. There are scattered small enhancing masses in the lower inner and lower outer anterior left (included in the measurement above).   2.  No evidence of right breast malignancy.   3. The axillary lymph nodes are not included within the field of view of this MRI for adequate assessment of lymphadenopathy.    10/17/2022 Procedure   S/p medi port placement.    10/25/2022 - 10/25/2022 Chemotherapy   Patient is on Treatment Plan : BREAST ADJUVANT DOSE DENSE AC q14d / PACLitaxel  q7d     10/25/2022 -  Chemotherapy   Patient is on Treatment Plan : BREAST Pembrolizumab  (200) D1 + Carboplatin  (5) D1 + Paclitaxel  (80) D1,8,15 q21d X 4 cycles / Pembrolizumab  (200) D1 + AC D1 q21d x 4 cycles      Genetic Testing   Single pathogenic variant in BRCA1 called c.190T>G identified on the [facility] Multi-Cancer+RNA panel. The remainder of testing was normal. The report date is 11/11/2022.  The Multi-Cancer + RNA Panel offered by [facility] includes sequencing and/or deletion/duplication analysis of the following 70 genes:  AIP*, ALK, APC*, ATM*, AXIN2*, BAP1*, BARD1*, BLM*, BMPR1A*, BRCA1*, BRCA2*, BRIP1*, CDC73*, CDH1*, CDK4, CDKN1B*, CDKN2A, CHEK2*, CTNNA1*, DICER1*, EPCAM, EGFR, FH*, FLCN*, GREM1, HOXB13, KIT, LZTR1, MAX*, MBD4, MEN1*, MET, MITF, MLH1*, MSH2*, MSH3*, MSH6*, MUTYH*, NF1*, NF2*, NTHL1*, PALB2*, PDGFRA, PMS2*, POLD1*, POLE*, POT1*, PRKAR1A*, PTCH1*, PTEN*, RAD51C*, RAD51D*, RB1*, RET, SDHA*, SDHAF2*, SDHB*, SDHC*, SDHD*,  SMAD4*, SMARCA4*, SMARCB1*, SMARCE1*, STK11*, SUFU*, TMEM127*, TP53*, TSC1*, TSC2*, VHL*. RNA analysis is performed for * genes.   11/24/2022 - 11/25/2022 Hospital Admission   Admitted due to acute pulmonary embolism, lower extremity US  negative for DVT.  Patietn was started on heparin  gtt and transitioned to Eliquis  at discharge.    02/10/2023 Procedure   S/p port placement.    03/10/2023 Imaging   MRI bilateral breast w wo contrast  No abnormal enhancement in the left breast consistent with response to neoadjuvant chemotherapy.   07/24/2023 Surgery   Patient underwent bilateral mastectomy 1. Breast, simple mastectomy, left, long stitch lateral, short stitch superior :      - CHANGES CONSISTENT WITH NEOADJUVANT THERAPY, NEGATIVE FOR RESIDUAL MALIGNANCY.      - SEE NOTE.      - SCLEROSING ADENOSIS AND SECRETORY CHANGE.      - BIOPSY SITE CHANGE WITH CLIP.      - UNREMARKABLE NIPPLE AND SKIN.       2. Breast, simple mastectomy, right, long stitch lateral, short stitch superior :      - SCLEROSING ADENOSIS, FIBROUS STROMA, AND FIBROADENOMATOID CHANGE.      - UNREMARKABLE NIPPLE AND SKIN.      - NEGATIVE FOR ATYPIA AND MALIGNANCY.       3. Lymph node, sentinel, biopsy, left  axillary #1 :      - TWO LYMPH NODES NEGATIVE FOR MALIGNANCY (0/2).      - SEE NOTE.      - BIOPSY SITE CHANGE INVOLVING ONE LYMPH NODE.      - FIBROUS SCARRING / CHANGES SUGGESTIVE OF NEOADJUVANT THERAPY ARE NOT      IDENTIFIED.       4. Breast, excision, left lateral margin :      - UNREMARKABLE BREAST TISSUE.      - NEGATIVE FOR ATYPIA AND MALIGNANCY.     Today she presents for evaluation prior to chemotherapy She tolerates chemotherapy.  On Eliquis  5mg  BID for PE. S/p bilateral mastectomy Patient has no concerns of her surgical sites.    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)    Microcytic anemia    MSSA bacteremia    Multifocal pneumonia    Sepsis (HCC)    due to port a cath   STD (sexually transmitted disease)    From Medical Hx;   Thrombocytopenia (HCC)    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   Vascular port complication     SURGICAL HISTORY: Past Surgical History:  Procedure Laterality Date   BREAST BIOPSY Right 05/20/2019   Affirm Bx- X- clip, neg   BREAST BIOPSY Left 10/02/2022   US  Bx, path pending   BREAST BIOPSY Left 10/02/2022   Us  Bx Node- path pending   BREAST BIOPSY Left 10/02/2022   US  LT BREAST BX W LOC DEV 1ST LESION IMG BX SPEC US  GUIDE 10/02/2022 [facility]   BREAST RECONSTRUCTION Bilateral 07/24/2023   Procedure: RECONSTRUCTION, BREAST;  Surgeon: Lowery Estefana RAMAN, DO;  Location: [facility];  Service: Plastics;  Laterality: Bilateral;  BILATERAL IMMEDIATE BREAST RECONSTRUCTION WITH EXPANDER AND FLEX HD PLACEMENT   CESAREAN SECTION     x 2   LAPAROSCOPIC BILATERAL SALPINGECTOMY Bilateral 02/09/2022   Procedure: LAPAROSCOPIC BILATERAL SALPINGECTOMY;  Surgeon: Connell Davies, MD;  Location: [facility];  Service: Gynecology;  Laterality: Bilateral;   MASTECTOMY W/ SENTINEL NODE BIOPSY Bilateral 07/24/2023   Procedure: MASTECTOMY WITH SENTINEL LYMPH NODE BIOPSY;  Surgeon: Tye Millet, DO;  Location: [facility];  Service: General;  Laterality: Bilateral;  SN biopsy left only   PORTACATH PLACEMENT Right 10/17/2022   Procedure: INSERTION PORT-A-CATH;  Surgeon: Desiderio Schanz, MD;  Location: [facility];  Service: General;  Laterality: Right;   PORTACATH PLACEMENT Left 02/10/2023   Procedure: INSERTION PORT-A-CATH;  Surgeon: Tye Millet, DO;  Location: [facility];  Service: General;  Laterality: Left;   TEE WITHOUT CARDIOVERSION N/A 01/02/2023   Procedure: TRANSESOPHAGEAL ECHOCARDIOGRAM (TEE);  Surgeon: Perla Evalene PARAS, MD;  Location:  [facility];  Service: Cardiovascular;  Laterality: N/A;   TUBAL LIGATION      SOCIAL HISTORY: Social History   Socioeconomic History   Marital status: Single    Spouse name: Not on file   Number of children: 2   Years of education: Not on file   Highest education level: High school graduate  Occupational History   Not on file  Tobacco Use   Smoking status: Never    Passive exposure: Never   Smokeless tobacco: Never  Vaping Use   Vaping status: Never Used  Substance  and Sexual Activity   Alcohol use: Never   Drug use: No   Sexual activity: Yes    Birth control/protection: Surgical  Other Topics Concern   Not on file  Social History Narrative   Lives with mom and 2 kids   Social Drivers of Health   Financial Resource Strain: Low Risk  (04/16/2023)   Received from [facility]   Overall Financial Resource Strain (CARDIA)    Difficulty of Paying Living Expenses: Not hard at all  Food Insecurity: No Food Insecurity (04/16/2023)   Received from [facility]   Hunger Vital Sign    Within the past 12 months, you worried that your food would run out before you got the money to buy more.: Never true    Within the past 12 months, the food you bought just didn't last and you didn't have money to get more.: Never true  Transportation Needs: No Transportation Needs (04/16/2023)   Received from [facility] - Transportation    In the past 12 months, has lack of transportation kept you from medical appointments or from getting medications?: No    Lack of Transportation (Non-Medical): No  Physical Activity: Not on file  Stress: No Stress Concern Present (10/10/2022)   [employer] [facility] [employer] - Occupational Stress Questionnaire    Feeling of Stress : Only a little  Social Connections: Not on file  Intimate Partner Violence: Not At Risk (12/31/2022)   Humiliation, Afraid, Rape, and Kick questionnaire    Fear of  Current or Ex-Partner: No    Emotionally Abused: No    Physically Abused: No    Sexually Abused: No    FAMILY HISTORY: Family History  Problem Relation Age of Onset   Breast cancer Mother 53       no GT   Breast cancer Maternal Aunt 33   Breast cancer Maternal Aunt    Lung cancer Maternal Uncle    Lung cancer Maternal Uncle    Bone cancer Paternal Aunt    Pancreatic cancer Maternal Grandmother 26   Lung cancer Maternal Grandfather 51   Breast cancer Cousin 40   Breast cancer Other 33       gene pos?    ALLERGIES:  has no known allergies.  MEDICATIONS:  Current Outpatient Medications  Medication Sig Dispense Refill   apixaban  (ELIQUIS ) 5 MG TABS tablet Take 1 tablet (5 mg total) by mouth 2 (two) times daily. 90 tablet 0   diazepam  (VALIUM ) 2 MG tablet Take 1 tablet (2 mg total) by mouth every 12 (twelve) hours as needed for up to 20 doses for muscle spasms. 20 tablet 0   lidocaine -prilocaine  (EMLA ) cream Apply 1 Application topically as needed. 30 g 11   ondansetron  (ZOFRAN ) 4 MG tablet Take 1 tablet (4 mg total) by mouth every 8 (eight) hours as needed for up to 20 doses for nausea or vomiting. 20 tablet 0   oxyCODONE  (ROXICODONE ) 5 MG immediate release tablet Take 1 tablet (5 mg total) by mouth every 6 (six) hours as needed for up to 20 doses for severe pain (pain score 7-10). 20 tablet 0   senna (SENOKOT) 8.6 MG TABS tablet Take 2 tablets (17.2 mg total) by mouth daily. 120 tablet 2   acetaminophen  (TYLENOL ) 500 MG tablet Take 2 tablets (1,000 mg total) by mouth every 6 (six) hours as needed for mild pain. (Patient not taking: Reported on 08/08/2023)  simethicone  (MYLICON) 80 MG chewable tablet Chew 1 tablet (80 mg total) by mouth every 6 (six) hours as needed for flatulence. (Patient not taking: Reported on 08/08/2023) 30 tablet 2   No current facility-administered medications for this visit.    Review of Systems  Constitutional:  Negative for appetite change, chills,  fatigue and fever.  HENT:   Negative for hearing loss and voice change.   Eyes:  Negative for eye problems.  Respiratory:  Negative for chest tightness and cough.   Cardiovascular:  Negative for chest pain.  Gastrointestinal:  Negative for abdominal distention, abdominal pain and blood in stool.  Endocrine: Negative for hot flashes.  Genitourinary:  Positive for menstrual problem. Negative for difficulty urinating and frequency.   Musculoskeletal:  Negative for arthralgias.  Skin:  Negative for itching and rash.  Neurological:  Negative for extremity weakness and headaches.  Hematological:  Negative for adenopathy.  Psychiatric/Behavioral:  Negative for confusion.      PHYSICAL EXAMINATION: ECOG PERFORMANCE STATUS: 0 - Asymptomatic  Vitals:   08/08/23 0832  BP: 108/67  Pulse: 77  Resp: 18  Temp: (!) 96 F (35.6 C)  SpO2: 100%   Filed Weights   08/08/23 0832  Weight: 150 lb 9.6 oz (68.3 kg)    Physical Exam Constitutional:      General: She is not in acute distress.    Appearance: She is not diaphoretic.  HENT:     Head: Normocephalic and atraumatic.   Eyes:     General: No scleral icterus.   Cardiovascular:     Rate and Rhythm: Normal rate and regular rhythm.  Pulmonary:     Effort: Pulmonary effort is normal. No respiratory distress.     Breath sounds: No wheezing.  Abdominal:     General: Bowel sounds are normal. There is no distension.     Palpations: Abdomen is soft.   Musculoskeletal:        General: Normal range of motion.     Cervical back: Normal range of motion and neck supple.   Skin:    General: Skin is warm and dry.     Findings: No erythema.     Comments: Left anterior chest wall medi port   Neurological:     Mental Status: She is alert and oriented to person, place, and time. Mental status is at baseline.     Motor: No abnormal muscle tone.   Psychiatric:        Mood and Affect: Mood and affect normal.     LABORATORY DATA:  I have  reviewed the data as listed    Latest Ref Rng & Units 07/25/2023    4:37 AM 07/24/2023    9:28 PM 05/16/2023    8:14 AM  CBC  WBC 4.0 - 10.5 K/uL 8.8  8.7  7.2   Hemoglobin 12.0 - 15.0 g/dL 88.2  88.2  87.8   Hematocrit 36.0 - 46.0 % 36.3  34.9  36.5   Platelets 150 - 400 K/uL 168  165  187       Latest Ref Rng & Units 07/25/2023    4:37 AM 07/24/2023    9:28 PM 05/16/2023    8:14 AM  CMP  Glucose 70 - 99 mg/dL 885   891   BUN 6 - 20 mg/dL 7   12   Creatinine 9.55 - 1.00 mg/dL 9.46  9.44  9.53   Sodium 135 - 145 mmol/L 140   138   Potassium 3.5 -  5.1 mmol/L 3.9   3.7   Chloride 98 - 111 mmol/L 109   107   CO2 22 - 32 mmol/L 27   23   Calcium  8.9 - 10.3 mg/dL 8.9   9.0   Total Protein 6.5 - 8.1 g/dL   7.5   Total Bilirubin 0.0 - 1.2 mg/dL   0.4   Alkaline Phos 38 - 126 U/L   95   AST 15 - 41 U/L   21   ALT 0 - 44 U/L   26      RADIOGRAPHIC STUDIES: I have personally reviewed the radiological images as listed and agreed with the findings in the report. NM Sentinel Node Inj-No Rpt (Breast) Result Date: 07/24/2023 Lymphoseek was injected by the Nuclear Medicine Technologist for sentinel lymph node localization.

## 2023-08-12 ENCOUNTER — Institutional Professional Consult (permissible substitution): Admitting: Plastic Surgery

## 2023-08-12 ENCOUNTER — Ambulatory Visit (INDEPENDENT_AMBULATORY_CARE_PROVIDER_SITE_OTHER): Admitting: Physician Assistant

## 2023-08-12 VITALS — BP 121/80 | HR 79

## 2023-08-12 DIAGNOSIS — Z9889 Other specified postprocedural states: Secondary | ICD-10-CM

## 2023-08-16 ENCOUNTER — Other Ambulatory Visit: Payer: Self-pay

## 2023-08-16 ENCOUNTER — Encounter: Payer: Self-pay | Admitting: Oncology

## 2023-08-16 ENCOUNTER — Emergency Department
Admission: EM | Admit: 2023-08-16 | Discharge: 2023-08-17 | Disposition: A | Discharge status: Home / Self Care | Attending: Emergency Medicine | Admitting: Emergency Medicine

## 2023-08-16 DIAGNOSIS — Z853 Personal history of malignant neoplasm of breast: Secondary | ICD-10-CM | POA: Insufficient documentation

## 2023-08-16 DIAGNOSIS — Z7901 Long term (current) use of anticoagulants: Secondary | ICD-10-CM | POA: Diagnosis not present

## 2023-08-16 DIAGNOSIS — T8140XA Infection following a procedure, unspecified, initial encounter: Secondary | ICD-10-CM

## 2023-08-16 DIAGNOSIS — T8149XA Infection following a procedure, other surgical site, initial encounter: Secondary | ICD-10-CM | POA: Diagnosis not present

## 2023-08-16 DIAGNOSIS — G8918 Other acute postprocedural pain: Secondary | ICD-10-CM | POA: Diagnosis present

## 2023-08-16 NOTE — Progress Notes (Signed)
 DISCONTINUE ON PATHWAY REGIMEN - Breast     Cycles 1 through 4: A cycle is every 21 days:     Pembrolizumab      Paclitaxel      Carboplatin      Filgrastim-xxxx    Cycles 5 through 8: A cycle is every 21 days:     Pembrolizumab      Doxorubicin      Cyclophosphamide      Pegfilgrastim-xxxx   **Always confirm dose/schedule in your pharmacy ordering system**  PRIOR TREATMENT: [id number]: Pembrolizumab 200 mg D1 + Paclitaxel 80 mg/m2 D1, 8, 15 + Carboplatin AUC=5 D1 q21 Days x 12 Weeks, Followed by Pembrolizumab 200 mg + Doxorubicin + Cyclophosphamide q21 Days x 12 Weeks, Followed by Surgery  START ON PATHWAY REGIMEN - Breast     A cycle is every 21 days:     Pembrolizumab   **Always confirm dose/schedule in your pharmacy ordering system**  Patient Characteristics: Post-Neoadjuvant Therapy and Resection, M0, HER2 Negative, No Residual Disease, ER Negative, Adjuvant Therapy - Continuation After Neoadjuvant Therapy Therapeutic Status: Post-Neoadjuvant Therapy and Resection, M0 Residual Invasive Disease Post-Neoadjuvant Therapy<= No ER Status: Negative (-) HER2 Status: Negative (-) PR Status: Negative (-) Intent of Therapy: Curative Intent, Discussed with Patient

## 2023-08-16 NOTE — Assessment & Plan Note (Addendum)
 Likely provoked, continue Eliquis  5mg  BID, plan 6 months- she has finished.

## 2023-08-16 NOTE — ED Triage Notes (Signed)
 Pt has a mastectomy in mid June, she has drains in place now but the drain that is associated with the left breat has been painful for her all day today, she is supposed to have a binder in place but she took it off thinking that may be the issue causing the drain to hurt. The drain has been draining appropriately today and since the surgery.

## 2023-08-16 NOTE — Assessment & Plan Note (Signed)
Once she finished treatment of breast cancer, recommend risk-reducing salpingo oophorectomy  pancreatic screening at age 38 (or 71 years younger than the earliest exocrine pancreatic cancer diagnosis in the family, whichever is earlier)  Family members are encouraged to consider genetic testing for this familial pathogenic variant.

## 2023-08-16 NOTE — Assessment & Plan Note (Signed)
 Labs are reviewed and discussed with patient. Lab Results  Component Value Date   HGB 11.7 (L) 07/25/2023   TIBC 377 02/28/2023   IRONPCTSAT 11 02/28/2023   FERRITIN 23 02/28/2023   Hemoglobin is stable.

## 2023-08-16 NOTE — ED Provider Notes (Signed)
 Penny Gomez Provider Note    Event Date/Time   First MD Initiated Contact with Patient 08/16/23 2304     (approximate)   History   Post-op Problem   HPI  Penny Gomez is a 38 y.o. female with history of breast cancer, status postmastectomy, PE on Eliquis , presenting with pain at her left drain site.  States that it started today.  No fevers.  States that it is still draining.  States that the right drain is draining as well but has no tenderness around it.  She denies any fever, no actual chest pain or shortness of breath.  Has been compliant with her medications.  Had not contacted her surgeon to discuss about the pain.  States no changes to her output.  States that it is typically yellow with some cloudiness to it that has been unchanged since surgery.  On independent chart review, she had bilateral simple mastectomy with immediate breast reconstruction done on 12 June.  Patient tolerated procedure without any complications.  Is scheduled to follow-up with Dr. Tye in 2 weeks after surgery.  Patient was seen by Dr. Tye the end of June, he noted that she was healing well, no issues, recommended further care per plastics.  Plastic surgery saw her on 1 July, expander reconstruction was performed by Dr. Shyrl, 250 cc were injected into the 535 cc expanders at time of surgery.  Drain output has been consistently 50 cc on each side daily.  He did report to them about some discomfort medially on the left reconstructed breast that felt tight but felt different from muscle spasms.  Had not required any oxycodone .  During that visit they removed bilateral superior lateral drains and left the other 2 drains in place.     Physical Exam   Triage Vital Signs: ED Triage Vitals  Encounter Vitals Group     BP 08/16/23 2101 127/74     Girls Systolic BP Percentile --      Girls Diastolic BP Percentile --      Boys Systolic BP Percentile --      Boys Diastolic BP  Percentile --      Pulse Rate 08/16/23 2101 73     Resp 08/16/23 2101 17     Temp 08/16/23 2101 99 F (37.2 C)     Temp src --      SpO2 08/16/23 2101 100 %     Weight --      Height --      Head Circumference --      Peak Flow --      Pain Score 08/16/23 2100 6     Pain Loc --      Pain Education --      Exclude from Growth Chart --     Most recent vital signs: Vitals:   08/16/23 2330 08/17/23 0212  BP: 120/83 110/79  Pulse: (!) 55 67  Resp: 18 16  Temp:    SpO2: 100% 100%     General: Awake, no distress.  CV:  Good peripheral perfusion.  Resp:  Normal effort.  No tachypnea or respiratory distress Abd:  No distention.  Soft nontender Other:  No unilateral calf pain or tenderness, she does have tenderness around the left drain site without purulent drainage, no palpable induration or fluctuance, no surrounding erythema, JP drain has serous and slightly cloudy fluid to bilateral drains.  30 cc of fluids in the left drain, 40 cc of fluid  in the right drain.  No tenderness, erythema, purulent drainage from the right drain site.   ED Results / Procedures / Treatments   Labs (all labs ordered are listed, but only abnormal results are displayed) Labs Reviewed  COMPREHENSIVE METABOLIC PANEL WITH GFR - Abnormal; Notable for the following components:      Result Value   Calcium  8.7 (*)    All other components within normal limits  CBC WITH DIFFERENTIAL/PLATELET  PREGNANCY, URINE     RADIOLOGY On my independent interpretation, CT showed bilateral stranding to breast implant sites.   PROCEDURES:  Critical Care performed: No  Procedures   MEDICATIONS ORDERED IN ED: Medications  iohexol  (OMNIPAQUE ) 300 MG/ML solution 75 mL (75 mLs Intravenous Contrast Given 08/17/23 0119)  morphine  (PF) 2 MG/ML injection 2 mg (2 mg Intravenous Given 08/17/23 0137)  amoxicillin -clavulanate (AUGMENTIN ) 875-125 MG per tablet 1 tablet (1 tablet Oral Given 08/17/23 0208)     IMPRESSION /  MDM / ASSESSMENT AND PLAN / ED COURSE  I reviewed the triage vital signs and the nursing notes.                              Differential diagnosis includes, but is not limited to, drain irritation, infection, abscess, dislodgment.  Will get labs, will get a CT of her chest.  Patient states that she has no actual chest pain or shortness of breath, pain is just at site of her left drain, doubt ACS, PE, intrathoracic etiology at this time.  Patient's presentation is most consistent with acute presentation with potential threat to life or bodily function.  Independent interpretation of labs and imaging below.  Consulted surgery who recommended starting antibiotics, first dose given in the emergency department.  Considered but no indication for inpatient admission at this time, she safe for outpatient management.  Shared decision making done with patient and she is agreeable with this plan.  Will send a prescription to her pharmacy, instructed her to follow-up with Dr. Tye as well as a plastic surgeon and to give them a call on Monday to schedule follow-up appointment.  Strict return precautions given.  Discharge.    Clinical Course as of 08/17/23 GARLON Repress Aug 17, 2023  0110 Independent review of labs, no leukocytosis, pregnancy test is negative, electrolytes not severely deranged, LFTs are normal. [TT]  0152 CT Chest W Contrast IMPRESSION: 1. No evidence of active pulmonary disease. 2. Bilateral breast implants with subcutaneous soft tissue drains in place. Mild edema in subcutaneous fat bilaterally but more prominent on the left. This is probably postoperative but could indicate cellulitis in the appropriate clinical setting. Correlate with physical examination. No loculated collection to suggest an abscess. 3. Incidental note of fatty infiltration of the liver.   [TT]  9795 Consulted Dr. Pabone from surgery, says to start Augmentin  for 7 days and they will see her in clinic. [TT]     Clinical Course User Index [TT] Waymond Lorelle Cummins, MD     FINAL CLINICAL IMPRESSION(S) / ED DIAGNOSES   Final diagnoses:  Post-operative pain  Postoperative infection, unspecified type, initial encounter     Rx / DC Orders   ED Discharge Orders          Ordered    amoxicillin -clavulanate (AUGMENTIN ) 875-125 MG tablet  2 times daily        08/17/23 0221             Note:  This document was prepared using Dragon voice recognition software and may include unintentional dictation errors.    Waymond Lorelle Cummins, MD 08/17/23 [phone]

## 2023-08-17 ENCOUNTER — Emergency Department

## 2023-08-17 LAB — CBC WITH DIFFERENTIAL/PLATELET
Abs Immature Granulocytes: 0.02 K/uL (ref 0.00–0.07)
Basophils Absolute: 0 K/uL (ref 0.0–0.1)
Basophils Relative: 0 %
Eosinophils Absolute: 0 K/uL (ref 0.0–0.5)
Eosinophils Relative: 0 %
HCT: 38.2 % (ref 36.0–46.0)
Hemoglobin: 12.1 g/dL (ref 12.0–15.0)
Immature Granulocytes: 0 %
Lymphocytes Relative: 28 %
Lymphs Abs: 1.7 K/uL (ref 0.7–4.0)
MCH: 28.7 pg (ref 26.0–34.0)
MCHC: 31.7 g/dL (ref 30.0–36.0)
MCV: 90.7 fL (ref 80.0–100.0)
Monocytes Absolute: 0.4 K/uL (ref 0.1–1.0)
Monocytes Relative: 6 %
Neutro Abs: 3.8 K/uL (ref 1.7–7.7)
Neutrophils Relative %: 66 %
Platelets: 247 K/uL (ref 150–400)
RBC: 4.21 MIL/uL (ref 3.87–5.11)
RDW: 12.7 % (ref 11.5–15.5)
WBC: 5.9 K/uL (ref 4.0–10.5)
nRBC: 0 % (ref 0.0–0.2)

## 2023-08-17 LAB — PREGNANCY, URINE: Preg Test, Ur: NEGATIVE

## 2023-08-17 LAB — COMPREHENSIVE METABOLIC PANEL WITH GFR
ALT: 30 U/L (ref 0–44)
AST: 32 U/L (ref 15–41)
Albumin: 3.5 g/dL (ref 3.5–5.0)
Alkaline Phosphatase: 90 U/L (ref 38–126)
Anion gap: 7 (ref 5–15)
BUN: 13 mg/dL (ref 6–20)
CO2: 24 mmol/L (ref 22–32)
Calcium: 8.7 mg/dL — ABNORMAL LOW (ref 8.9–10.3)
Chloride: 106 mmol/L (ref 98–111)
Creatinine, Ser: 0.65 mg/dL (ref 0.44–1.00)
GFR, Estimated: >60 mL/min (ref >60)
Glucose, Bld: 98 mg/dL (ref 70–99)
Potassium: 3.9 mmol/L (ref 3.5–5.1)
Sodium: 137 mmol/L (ref 135–145)
Total Bilirubin: 0.3 mg/dL (ref 0.0–1.2)
Total Protein: 6.9 g/dL (ref 6.5–8.1)

## 2023-08-17 MED ORDER — IOHEXOL 300 MG/ML  SOLN
75.0000 mL | Freq: Once | INTRAMUSCULAR | Status: AC | PRN
Start: 2023-08-17 — End: 2023-08-17
  Administered 2023-08-17: 75 mL via INTRAVENOUS

## 2023-08-17 MED ORDER — AMOXICILLIN-POT CLAVULANATE 875-125 MG PO TABS
1.0000 | ORAL_TABLET | Freq: Once | ORAL | Status: AC
  Administered 2023-08-17: 1 via ORAL
  Filled 2023-08-17: qty 1

## 2023-08-17 MED ORDER — AMOXICILLIN-POT CLAVULANATE 875-125 MG PO TABS: 1.0000 | ORAL_TABLET | Freq: Two times a day (BID) | ORAL | 0 refills | Status: AC

## 2023-08-17 MED ORDER — AMOXICILLIN-POT CLAVULANATE 875-125 MG PO TABS: 1.0000 | ORAL_TABLET | Freq: Two times a day (BID) | ORAL | 0 refills | Status: DC

## 2023-08-17 MED ORDER — MORPHINE SULFATE (PF) 2 MG/ML IV SOLN
2.0000 mg | Freq: Once | INTRAVENOUS | Status: AC
  Administered 2023-08-17: 2 mg via INTRAVENOUS
  Filled 2023-08-17: qty 1

## 2023-08-17 NOTE — Discharge Instructions (Signed)
 Please make sure to take the antibiotics as prescribed for your infection.  Please make sure to call your plastic surgeon as well as Dr. Estelita office on Monday to schedule follow-up appointment.

## 2023-08-17 NOTE — ED Notes (Signed)
 Patient transported to CT

## 2023-08-18 ENCOUNTER — Ambulatory Visit
Admission: RE | Admit: 2023-08-18 | Discharge: 2023-08-18 | Disposition: A | Discharge status: Home / Self Care | Source: Ambulatory Visit | Attending: Radiation Oncology | Admitting: Radiation Oncology

## 2023-08-18 ENCOUNTER — Encounter: Payer: Self-pay | Admitting: Radiation Oncology

## 2023-08-18 VITALS — BP 111/76 | HR 60 | Temp 97.3°F | Resp 16 | Wt 151.0 lb

## 2023-08-18 DIAGNOSIS — Z170 Estrogen receptor positive status [ER+]: Secondary | ICD-10-CM | POA: Insufficient documentation

## 2023-08-18 DIAGNOSIS — Z1501 Genetic susceptibility to malignant neoplasm of breast: Secondary | ICD-10-CM | POA: Diagnosis not present

## 2023-08-18 DIAGNOSIS — Z803 Family history of malignant neoplasm of breast: Secondary | ICD-10-CM | POA: Insufficient documentation

## 2023-08-18 DIAGNOSIS — Z9013 Acquired absence of bilateral breasts and nipples: Secondary | ICD-10-CM | POA: Diagnosis not present

## 2023-08-18 DIAGNOSIS — Z86711 Personal history of pulmonary embolism: Secondary | ICD-10-CM | POA: Diagnosis not present

## 2023-08-18 DIAGNOSIS — Z7901 Long term (current) use of anticoagulants: Secondary | ICD-10-CM | POA: Diagnosis not present

## 2023-08-18 DIAGNOSIS — C50512 Malignant neoplasm of lower-outer quadrant of left female breast: Secondary | ICD-10-CM | POA: Diagnosis present

## 2023-08-18 DIAGNOSIS — A64 Unspecified sexually transmitted disease: Secondary | ICD-10-CM | POA: Diagnosis not present

## 2023-08-18 DIAGNOSIS — Z79899 Other long term (current) drug therapy: Secondary | ICD-10-CM | POA: Insufficient documentation

## 2023-08-18 DIAGNOSIS — Z90722 Acquired absence of ovaries, bilateral: Secondary | ICD-10-CM | POA: Diagnosis not present

## 2023-08-18 DIAGNOSIS — Z9221 Personal history of antineoplastic chemotherapy: Secondary | ICD-10-CM | POA: Diagnosis not present

## 2023-08-18 DIAGNOSIS — Z801 Family history of malignant neoplasm of trachea, bronchus and lung: Secondary | ICD-10-CM | POA: Diagnosis not present

## 2023-08-18 DIAGNOSIS — Z800 Family history of malignant neoplasm of digestive organs: Secondary | ICD-10-CM | POA: Insufficient documentation

## 2023-08-18 DIAGNOSIS — D509 Iron deficiency anemia, unspecified: Secondary | ICD-10-CM | POA: Insufficient documentation

## 2023-08-18 NOTE — Consult Note (Signed)
 NEW PATIENT EVALUATION  Name: Penny Gomez  MRN: [id number]  Date:   08/18/2023     DOB: [birth year]   This 38 y.o. 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  Patient is BRCA1 positive  REFERRING PHYSICIAN: Sherial Bail, MD  CHIEF COMPLAINT:  Chief Complaint  Patient presents with   Breast Cancer    DIAGNOSIS: The encounter diagnosis was Malignant neoplasm of lower-outer quadrant of left breast of female, estrogen receptor negative (HCC).   PREVIOUS INVESTIGATIONS:  Pathology reports reviewed Mammograms, CT scans breast MRI scans reviewed Clinical notes reviewed  HPI: Patient is a 38 year old female who presented with self discovered mass in the left breast.  Initial ultrasound and mammography showed a 1.6 x 1.3 x 2.6 cm irregular hypoechoic mass in the left breast at the 8 o'clock position 6 cm from nipple.  There is mildly thickened left axillary lymph node with a cortex of 5 mm.  Both node and breast were biopsied node was negative for malignancy.  Breast showed invasive mammary carcinoma.  MRI confirmed a 6.7 cm area of abnormal enhancement predominantly in the lower inner inferior left breast.  There are also scattered small enhancing masses in the lower inner and lower outer anterior breast.  No evidence of right breast malignancy axillary lymph nodes were not included in the field-of-view for this MRI.  Patient underwent neoadjuvant chemotherapy with carboplatinum Taxol  and Keytruda  with MRI showing positive treatment response.  She then underwent bilateral mastectomy with no evidence of residual disease in the left breast noted.  There were changes consistent with neoadjuvant therapy negative for residual malignancy.  2 sentinel lymph nodes were examined both negative for malignancy.  She currently has  expanders placed in her breast to scheduled for reconstruction completion in November.  She was recently seen in the ER for possible drain infection has been treated with antibiotic therapy and it is resolving.  She is seen today for radiation oncology opinion.  Patient did test positive for BRCA1 mutation.  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    PAST SURGICAL HISTORY:  Past Surgical History:  Procedure Laterality Date   BREAST BIOPSY Right 05/20/2019   Affirm Bx- X- clip, neg   BREAST BIOPSY Left 10/02/2022   US  Bx, path pending   BREAST BIOPSY Left 10/02/2022   Us  Bx Node- path pending   BREAST BIOPSY Left 10/02/2022   US  LT BREAST BX W LOC DEV 1ST LESION IMG BX SPEC US  GUIDE 10/02/2022 [facility]   BREAST RECONSTRUCTION Bilateral 07/24/2023   Procedure: RECONSTRUCTION, BREAST;  Surgeon: Lowery Estefana RAMAN, DO;  Location: [facility];  Service: Plastics;  Laterality: Bilateral;  BILATERAL IMMEDIATE BREAST RECONSTRUCTION WITH EXPANDER AND FLEX HD PLACEMENT   CESAREAN SECTION     x 2   LAPAROSCOPIC BILATERAL SALPINGECTOMY Bilateral 02/09/2022   Procedure: LAPAROSCOPIC BILATERAL SALPINGECTOMY;  Surgeon: Connell Davies, MD;  Location: [facility];  Service: Gynecology;  Laterality: Bilateral;   MASTECTOMY W/ SENTINEL NODE BIOPSY Bilateral 07/24/2023  Procedure: MASTECTOMY WITH SENTINEL LYMPH NODE BIOPSY;  Surgeon: Tye Millet, DO;  Location: [facility];  Service: General;  Laterality: Bilateral;   SN biopsy left only   PORTACATH PLACEMENT Right 10/17/2022   Procedure: INSERTION PORT-A-CATH;  Surgeon: Desiderio Schanz, MD;  Location: [facility];  Service: General;  Laterality: Right;   PORTACATH PLACEMENT Left 02/10/2023   Procedure: INSERTION PORT-A-CATH;  Surgeon: Tye Millet, DO;  Location: [facility];  Service: General;  Laterality: Left;   TEE WITHOUT CARDIOVERSION N/A 01/02/2023   Procedure: TRANSESOPHAGEAL ECHOCARDIOGRAM (TEE);  Surgeon: Perla Evalene PARAS, MD;  Location: [facility];  Service: Cardiovascular;  Laterality: N/A;   TUBAL LIGATION      FAMILY HISTORY: family history includes Bone cancer in her paternal aunt; Breast cancer in her maternal aunt; Breast cancer (age of onset: 45) in her maternal aunt, mother, and another family member; Breast cancer (age of onset: 57) in her cousin; Lung cancer in her maternal uncle and maternal uncle; Lung cancer (age of onset: 56) in her maternal grandfather; Pancreatic cancer (age of onset: 47) in her maternal grandmother.  SOCIAL HISTORY:  reports that she has never smoked. She has never been exposed to tobacco smoke. She has never used smokeless tobacco. She reports that she does not drink alcohol and does not use drugs.  ALLERGIES: Patient has no known allergies.  MEDICATIONS:  Current Outpatient Medications  Medication Sig Dispense Refill   acetaminophen  (TYLENOL ) 500 MG tablet Take 2 tablets (1,000 mg total) by mouth every 6 (six) hours as needed for mild pain.     amoxicillin -clavulanate (AUGMENTIN ) 875-125 MG tablet Take 1 tablet by mouth 2 (two) times daily for 7 days. 14 tablet 0   apixaban  (ELIQUIS ) 5 MG TABS tablet Take 1 tablet (5 mg total) by mouth 2 (two) times daily. 90 tablet 0   diazepam  (VALIUM ) 2 MG tablet Take 1 tablet (2 mg total) by mouth every 12 (twelve) hours as needed for up to 20 doses for muscle spasms. 20 tablet 0   lidocaine -prilocaine  (EMLA ) cream Apply 1 Application topically as needed. 30 g 11   ondansetron   (ZOFRAN ) 4 MG tablet Take 1 tablet (4 mg total) by mouth every 8 (eight) hours as needed for up to 20 doses for nausea or vomiting. 20 tablet 0   oxyCODONE  (ROXICODONE ) 5 MG immediate release tablet Take 1 tablet (5 mg total) by mouth every 6 (six) hours as needed for up to 20 doses for severe pain (pain score 7-10). 20 tablet 0   senna (SENOKOT) 8.6 MG TABS tablet Take 2 tablets (17.2 mg total) by mouth daily. 120 tablet 2   simethicone  (MYLICON) 80 MG chewable tablet Chew 1 tablet (80 mg total) by mouth every 6 (six) hours as needed for flatulence. 30 tablet 2   No current facility-administered medications for this encounter.    ECOG PERFORMANCE STATUS:  0 - Asymptomatic  REVIEW OF SYSTEMS: Patient denies any weight loss, fatigue, weakness, fever, chills or night sweats. Patient denies any loss of vision, blurred vision. Patient denies any ringing  of the ears or hearing loss. No irregular heartbeat. Patient denies heart murmur or history of fainting. Patient denies any chest pain or pain radiating to her upper extremities. Patient denies any shortness of breath, difficulty breathing at night, cough or hemoptysis. Patient denies any swelling in the lower legs. Patient denies any nausea vomiting, vomiting of blood, or coffee ground material in the vomitus. Patient denies any stomach pain. Patient states has had  normal bowel movements no significant constipation or diarrhea. Patient denies any dysuria, hematuria or significant nocturia. Patient denies any problems walking, swelling in the joints or loss of balance. Patient denies any skin changes, loss of hair or loss of weight. Patient denies any excessive worrying or anxiety or significant depression. Patient denies any problems with insomnia. Patient denies excessive thirst, polyuria, polydipsia. Patient denies any swollen glands, patient denies easy bruising or easy bleeding. Patient denies any recent infections, allergies or URI. Patient s visual  fields have not changed significantly in recent time.   PHYSICAL EXAM: BP 111/76   Pulse 60   Temp (!) 97.3 F (36.3 C)   Resp 16   Wt 151 lb (68.5 kg)   BMI 27.62 kg/m  Patient has expanders placed in her bilateral breasts.  No dominant masses noted in either breast.  No axillary or supraclavicular adenopathy is identified.  She does have a drain present in the left breast functioning well.  Well-developed well-nourished patient in NAD. HEENT reveals PERLA, EOMI, discs not visualized.  Oral cavity is clear. No oral mucosal lesions are identified. Neck is clear without evidence of cervical or supraclavicular adenopathy. Lungs are clear to A&P. Cardiac examination is essentially unremarkable with regular rate and rhythm without murmur rub or thrill. Abdomen is benign with no organomegaly or masses noted. Motor sensory and DTR levels are equal and symmetric in the upper and lower extremities. Cranial nerves II through XII are grossly intact. Proprioception is intact. No peripheral adenopathy or edema is identified. No motor or sensory levels are noted. Crude visual fields are within normal range.  LABORATORY DATA: Pathology reports reviewed    RADIOLOGY RESULTS: MRI scan CT scans mammograms and ultrasound all reviewed compatible with above-stated findings   IMPRESSION: Stage IIIb triple negative invasive mammary carcinoma of the left breast status postmastectomy with complete response after neoadjuvant chemotherapy in 38 year old female  PLAN: At this time I have recommended a hypofractionated course of whole breast radiation to her left breast.  We treat her over 3 weeks.  Will not include her lymph nodes since there was no positive indication anytime that she had lymph node involvement.  I would base my recommendations for postmastectomy radiation therapy based on her young age triple negative nature of her disease and T3 lesion.  I will coordinate with her plastic surgeon to see if it all is  all right to go ahead with left chest wall radiation therapy at this time prior to his initiating final reconstructive surgery in November.  Risks and benefits of treatment clued skin reaction fatigue alteration blood counts possible inclusion of superficial lung all were described in detail to the patient.  I have set her up for simulation in about a week and a half to allow further healing and hopefully the drains can be removed.  Patient comprehends my recommendations well.  I would like to take this opportunity to thank you for allowing me to participate in the care of your patient.SABRA Marcey Penton, MD

## 2023-08-18 NOTE — Progress Notes (Unsigned)
 38 year old female here for follow-up after bilateral mastectomy with immediate breast reconstruction and placement of tissue expanders and Flex HD with Dr. Windell and Dr. Lowery on 07/24/2023.  She is about 3-1/2 weeks postop.  She did present to the ED on 08/16/2023 for pain to the left drain site.  CT scan overall unremarkable, however mild edema noted in subcutaneous fat bilaterally, likely postoperative but could indicate cellulitis.  ED provider spoke with general surgery, recommended Augmentin .  Patient reports today***   She currently has 300 cc out of 535 cc in each tissue expander.

## 2023-08-19 ENCOUNTER — Ambulatory Visit: Admitting: Surgical

## 2023-08-19 DIAGNOSIS — C50919 Malignant neoplasm of unspecified site of unspecified female breast: Secondary | ICD-10-CM

## 2023-08-19 DIAGNOSIS — Z9889 Other specified postprocedural states: Secondary | ICD-10-CM

## 2023-08-19 DIAGNOSIS — Z1509 Genetic susceptibility to other malignant neoplasm: Secondary | ICD-10-CM

## 2023-08-19 DIAGNOSIS — Z1501 Genetic susceptibility to malignant neoplasm of breast: Secondary | ICD-10-CM

## 2023-08-20 ENCOUNTER — Ambulatory Visit: Admitting: Occupational Therapy

## 2023-08-20 ENCOUNTER — Encounter: Payer: Self-pay | Admitting: *Deleted

## 2023-08-20 ENCOUNTER — Ambulatory Visit: Admitting: Surgical

## 2023-08-20 DIAGNOSIS — Z9889 Other specified postprocedural states: Secondary | ICD-10-CM

## 2023-08-20 DIAGNOSIS — C50919 Malignant neoplasm of unspecified site of unspecified female breast: Secondary | ICD-10-CM

## 2023-08-20 DIAGNOSIS — Z1509 Genetic susceptibility to other malignant neoplasm: Secondary | ICD-10-CM

## 2023-08-20 DIAGNOSIS — Z1501 Genetic susceptibility to malignant neoplasm of breast: Secondary | ICD-10-CM

## 2023-08-20 NOTE — Progress Notes (Signed)
 Penny Gomez called asking about financial resources as she is going to begin radiation soon.   Referral placed to Child psychotherapist.

## 2023-08-20 NOTE — Progress Notes (Signed)
 38 year old female status post bilateral mastectomy with placement of tissue expanders.  She is currently expanded to a size that she likes.  She was initially scheduled to have bilateral Diep flaps at [facility], however she is very happy with the appearance of the expanders at this time and would like to undergo expander to implant exchange and have silicone implants placed.  She is scheduled to have radiation so we will hopefully be able to plan exchange prior to her beginning radiation.  I will reach out to radiation oncology and discuss patient's case with Dr. Lowery.  Hopefully we can exchange expanders to implants in the next few weeks prior to radiation beginning.  We discussed the benefits of implant-based reconstruction versus Diep flap reconstruction.  We did discuss that implants typically will need to be replaced, manufacture recommendations are every 10 years, however could possibly need to be replaced sooner if there are any issues or problems.  Patient was understanding of this.  All of her questions were answered to her content related to this.  The patient gave consent to have this visit done by telemedicine / virtual visit, two identifiers were used to identify patient. This is also consent for access the chart and treat the patient via this visit. The patient is located in [place] .  I, the provider, am at the office.  We spent 5 minutes together for the visit.  Joined by phone.

## 2023-08-20 NOTE — Addendum Note (Signed)
 Addended byBETHA LENIS COUGH on: 08/20/2023 10:26 AM   Modules accepted: Orders

## 2023-08-21 ENCOUNTER — Inpatient Hospital Stay: Attending: Oncology

## 2023-08-21 ENCOUNTER — Encounter: Payer: Self-pay | Admitting: Oncology

## 2023-08-21 DIAGNOSIS — Z5112 Encounter for antineoplastic immunotherapy: Secondary | ICD-10-CM | POA: Insufficient documentation

## 2023-08-21 DIAGNOSIS — Z1501 Genetic susceptibility to malignant neoplasm of breast: Secondary | ICD-10-CM | POA: Insufficient documentation

## 2023-08-21 DIAGNOSIS — Z1722 Progesterone receptor negative status: Secondary | ICD-10-CM | POA: Insufficient documentation

## 2023-08-21 DIAGNOSIS — Z171 Estrogen receptor negative status [ER-]: Secondary | ICD-10-CM | POA: Insufficient documentation

## 2023-08-21 DIAGNOSIS — Z86711 Personal history of pulmonary embolism: Secondary | ICD-10-CM | POA: Insufficient documentation

## 2023-08-21 DIAGNOSIS — Z1509 Genetic susceptibility to other malignant neoplasm: Secondary | ICD-10-CM | POA: Insufficient documentation

## 2023-08-21 DIAGNOSIS — Z1732 Human epidermal growth factor receptor 2 negative status: Secondary | ICD-10-CM | POA: Insufficient documentation

## 2023-08-21 DIAGNOSIS — C50312 Malignant neoplasm of lower-inner quadrant of left female breast: Secondary | ICD-10-CM | POA: Insufficient documentation

## 2023-08-21 DIAGNOSIS — Z9013 Acquired absence of bilateral breasts and nipples: Secondary | ICD-10-CM | POA: Insufficient documentation

## 2023-08-21 NOTE — Progress Notes (Signed)
 [facility] CSW Progress Note  Clinical Child psychotherapist contacted patient by phone to follow-up on need for community resources.    Interventions: Referred patient to community resources: Verified patient had utilized all of her [employer] previously.  Provided her with information regarding [facility] who will give her free gas cards.  She must fill out an application and provide proof of her treatment appointments.  She stated she understood.       Follow Up Plan:  Patient will contact CSW with any support or resource needs    Macario CHRISTELLA Au, LCSW Clinical Social Worker [facility]    Patient is participating in a Managed Medicaid Plan:  Yes

## 2023-08-22 ENCOUNTER — Inpatient Hospital Stay

## 2023-08-22 ENCOUNTER — Other Ambulatory Visit: Payer: Self-pay

## 2023-08-22 ENCOUNTER — Encounter: Payer: Self-pay | Admitting: Oncology

## 2023-08-22 ENCOUNTER — Inpatient Hospital Stay (HOSPITAL_BASED_OUTPATIENT_CLINIC_OR_DEPARTMENT_OTHER): Admitting: Oncology

## 2023-08-22 VITALS — BP 118/63 | HR 64 | Temp 97.8°F | Resp 16 | Wt 151.0 lb

## 2023-08-22 DIAGNOSIS — Z5112 Encounter for antineoplastic immunotherapy: Secondary | ICD-10-CM | POA: Insufficient documentation

## 2023-08-22 DIAGNOSIS — Z1501 Genetic susceptibility to malignant neoplasm of breast: Secondary | ICD-10-CM | POA: Diagnosis not present

## 2023-08-22 DIAGNOSIS — C50312 Malignant neoplasm of lower-inner quadrant of left female breast: Secondary | ICD-10-CM | POA: Diagnosis present

## 2023-08-22 DIAGNOSIS — Z1509 Genetic susceptibility to other malignant neoplasm: Secondary | ICD-10-CM | POA: Diagnosis not present

## 2023-08-22 DIAGNOSIS — C50919 Malignant neoplasm of unspecified site of unspecified female breast: Secondary | ICD-10-CM

## 2023-08-22 DIAGNOSIS — Z86711 Personal history of pulmonary embolism: Secondary | ICD-10-CM | POA: Diagnosis not present

## 2023-08-22 DIAGNOSIS — Z1732 Human epidermal growth factor receptor 2 negative status: Secondary | ICD-10-CM | POA: Diagnosis not present

## 2023-08-22 DIAGNOSIS — Z1722 Progesterone receptor negative status: Secondary | ICD-10-CM | POA: Diagnosis not present

## 2023-08-22 DIAGNOSIS — Z171 Estrogen receptor negative status [ER-]: Secondary | ICD-10-CM | POA: Diagnosis not present

## 2023-08-22 DIAGNOSIS — Z9013 Acquired absence of bilateral breasts and nipples: Secondary | ICD-10-CM | POA: Diagnosis not present

## 2023-08-22 LAB — CBC WITH DIFFERENTIAL (CANCER CENTER ONLY)
Abs Immature Granulocytes: 0.01 K/uL (ref 0.00–0.07)
Basophils Absolute: 0 K/uL (ref 0.0–0.1)
Basophils Relative: 0 %
Eosinophils Absolute: 0 K/uL (ref 0.0–0.5)
Eosinophils Relative: 0 %
HCT: 38.7 % (ref 36.0–46.0)
Hemoglobin: 12.6 g/dL (ref 12.0–15.0)
Immature Granulocytes: 0 %
Lymphocytes Relative: 26 %
Lymphs Abs: 1 K/uL (ref 0.7–4.0)
MCH: 29.3 pg (ref 26.0–34.0)
MCHC: 32.6 g/dL (ref 30.0–36.0)
MCV: 90 fL (ref 80.0–100.0)
Monocytes Absolute: 0.3 K/uL (ref 0.1–1.0)
Monocytes Relative: 7 %
Neutro Abs: 2.7 K/uL (ref 1.7–7.7)
Neutrophils Relative %: 67 %
Platelet Count: 205 K/uL (ref 150–400)
RBC: 4.3 MIL/uL (ref 3.87–5.11)
RDW: 12.8 % (ref 11.5–15.5)
WBC Count: 4 K/uL (ref 4.0–10.5)
nRBC: 0 % (ref 0.0–0.2)

## 2023-08-22 LAB — CMP (CANCER CENTER ONLY)
ALT: 24 U/L (ref 0–44)
AST: 27 U/L (ref 15–41)
Albumin: 3.3 g/dL — ABNORMAL LOW (ref 3.5–5.0)
Alkaline Phosphatase: 90 U/L (ref 38–126)
Anion gap: 6 (ref 5–15)
BUN: 8 mg/dL (ref 6–20)
CO2: 24 mmol/L (ref 22–32)
Calcium: 8.6 mg/dL — ABNORMAL LOW (ref 8.9–10.3)
Chloride: 107 mmol/L (ref 98–111)
Creatinine: 0.72 mg/dL (ref 0.44–1.00)
GFR, Estimated: >60 mL/min (ref >60)
Glucose, Bld: 98 mg/dL (ref 70–99)
Potassium: 3.9 mmol/L (ref 3.5–5.1)
Sodium: 137 mmol/L (ref 135–145)
Total Bilirubin: 0.4 mg/dL (ref 0.0–1.2)
Total Protein: 6.5 g/dL (ref 6.5–8.1)

## 2023-08-22 LAB — TSH: TSH: 3.053 u[IU]/mL (ref 0.350–4.500)

## 2023-08-22 MED ORDER — SODIUM CHLORIDE 0.9 % IV SOLN
INTRAVENOUS | Status: DC
  Filled 2023-08-22: qty 250

## 2023-08-22 MED ORDER — HEPARIN SOD (PORK) LOCK FLUSH 100 UNIT/ML IV SOLN
500.0000 [IU] | Freq: Once | INTRAVENOUS | Status: AC | PRN
  Administered 2023-08-22: 500 [IU]
  Filled 2023-08-22: qty 5

## 2023-08-22 MED ORDER — SODIUM CHLORIDE 0.9 % IV SOLN
200.0000 mg | Freq: Once | INTRAVENOUS | Status: AC
  Administered 2023-08-22: 200 mg via INTRAVENOUS
  Filled 2023-08-22: qty 8

## 2023-08-22 NOTE — Assessment & Plan Note (Signed)
 Recommend OTC calcium  supplementation

## 2023-08-22 NOTE — Assessment & Plan Note (Addendum)
 Likely provoked, continue Eliquis  5mg  BID, plan 6 months- she has finished.  Stop Eliquis  .

## 2023-08-22 NOTE — Assessment & Plan Note (Signed)
Once she finished treatment of breast cancer, recommend risk-reducing salpingo oophorectomy  pancreatic screening at age 38 (or 71 years younger than the earliest exocrine pancreatic cancer diagnosis in the family, whichever is earlier)  Family members are encouraged to consider genetic testing for this familial pathogenic variant.

## 2023-08-22 NOTE — Assessment & Plan Note (Signed)
 Immunotherapy plan as listed above

## 2023-08-22 NOTE — Progress Notes (Signed)
 Hematology/Oncology Progress note [phone] [phone]       CHIEF COMPLAINTS/PURPOSE OF CONSULTATION:  Left triple negative breast cancer.   ASSESSMENT & PLAN:   Cancer Staging  Invasive carcinoma of breast (HCC) Staging form: Breast, AJCC 8th Edition - Clinical stage from 10/10/2022: Stage IIIB (cT3, cN0, cM0, G3, ER-, PR-, HER2-) - Signed by Babara Call, MD on 10/18/2022   History of pulmonary embolism Likely provoked, continue Eliquis  5mg  BID, plan 6 months- she has finished.  Stop Eliquis  .     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  S/p neoadjuvant chemotherapy with Carboplatin , taxol  Keytruda  Q3 weeks x 4 cycle  followed by [facility] + Keytrua with D4 GCSF x 4 cycles.  S/p bilateral mastectomy, no residual disease.  Patient has BRCA 1 mutation, no residual disease, does not qualify Olarparib eligibility.  Recommend adjuvant Keytrya every 3 weeks for 9 cycles.  Labs are reviewed and discussed with patient. Proceed with Keytruda .  She will get plastic surgery at end of July followed by Radiation.    Encounter for antineoplastic immunotherapy Immunotherapy plan as listed above  BRCA1 gene mutation positive Once she finished treatment of breast cancer, recommend risk-reducing salpingo oophorectomy  pancreatic screening at age 89 (or 47 years younger than the earliest exocrine pancreatic cancer diagnosis in the family, whichever is earlier)  Family members are encouraged to consider genetic testing for this familial pathogenic variant.   Hypocalcemia Recommend OTC calcium  supplementation  Orders Placed This Encounter  Procedures   CBC with Differential (Cancer Center Only)    Standing Status:   Future    Expected Date:   09/19/2023    Expiration Date:   09/18/2024   CMP (Cancer Center only)    Standing Status:   Future    Expected Date:   09/19/2023    Expiration Date:   09/18/2024   All questions were answered. The  patient knows to call the clinic with any problems, questions or concerns.  Call Babara, MD, PhD [facility] Hematology Oncology 08/22/2023    HISTORY OF PRESENTING ILLNESS:  Penny Gomez 38 y.o. female presents to establish care for left triple negative breast cancer I have reviewed her chart and materials related to her cancer extensively and collaborated history with the patient. Summary of oncologic history is as follows: Oncology History  Invasive carcinoma of breast (HCC)  07/24/2022 Mammogram   She noticed left breast mass for 1 month.  Bilateral diagnostic mammogram showed Suspicious palpable left breast mass 8 o'clock position. Cortically thickened left axillary lymph node   10/10/2022 Initial Diagnosis   Invasive carcinoma of breast (HCC)  10/02/2022  Left breast mass biopsy and left axillary lymph node biopsy.  Diagnosis 1. Breast, left, needle core biopsy, 8 o'clock 6 cmfn, heart clip - INVASIVE MAMMARY CARCINOMA, NO SPECIAL TYPE. - TUBULE FORMATION: SCORE 3 - NUCLEAR PLEOMORPHISM: SCORE 3 - MITOTIC COUNT: SCORE 3 - TOTAL SCORE: 9 - OVERALL GRADE: 3 - LYMPHOVASCULAR INVASION: NOT IDENTIFIED - CANCER LENGTH: 11 MM - CALCIFICATIONS: NOT IDENTIFIED - DUCTAL CARCINOMA IN SITU: PRESENT, HIGH-GRADE - ER-, PR- HER2 - [IHC 1+] 2. Lymph node, needle/core biopsy, left axillary, hydromark - LYMPH NODE WITH REACTIVE CHANGES; NEGATIVE FOR MALIGNANCY.  Menarche at age of 65 First live birth at age of 70 OCP use: no History of hysterectomy: no Menopausal status: premenopausal History of HRT use: no History of chest radiation: no Number of previous breast biopsies:  right breast biopsy  05/20/2019 negative for malignancy Strong family history of cancer mother breast cancer diagnosed in 61s, maternal aunts x 2 breast cancer, maternal cousins breast cancer x 2, maternal grandmother pancreatic cancer.    10/10/2022 Cancer Staging   Staging form: Breast, AJCC 8th Edition - Clinical  stage from 10/10/2022: Stage IIIB (cT3, cN0, cM0, G3, ER-, PR-, HER2-) - Signed by Babara Call, MD on 10/18/2022 Stage prefix: Initial diagnosis Nuclear grade: G3 Histologic grading system: 3 grade system   10/17/2022 Imaging   Bilateral MRI breasts w wo contrast  1. In total, there is approximately 6.7 cm of abnormal enhancement, predominantly in the lower-inner inferior left breast, with the ultrasound-guided biopsy marking clip centrally positioned within the enhancement. There are scattered small enhancing masses in the lower inner and lower outer anterior left (included in the measurement above).   2.  No evidence of right breast malignancy.   3. The axillary lymph nodes are not included within the field of view of this MRI for adequate assessment of lymphadenopathy.    10/17/2022 Procedure   S/p medi port placement.    10/25/2022 - 10/25/2022 Chemotherapy   Patient is on Treatment Plan : BREAST ADJUVANT DOSE DENSE AC q14d / PACLitaxel  q7d     10/25/2022 - 05/19/2023 Chemotherapy   Patient is on Treatment Plan : BREAST Pembrolizumab  (200) D1 + Carboplatin  (5) D1 + Paclitaxel  (80) D1,8,15 q21d X 4 cycles / Pembrolizumab  (200) D1 + AC D1 q21d x 4 cycles      Genetic Testing   Single pathogenic variant in BRCA1 called c.190T>G identified on the [facility] Multi-Cancer+RNA panel. The remainder of testing was normal. The report date is 11/11/2022.  The Multi-Cancer + RNA Panel offered by [facility] includes sequencing and/or deletion/duplication analysis of the following 70 genes:  AIP*, ALK, APC*, ATM*, AXIN2*, BAP1*, BARD1*, BLM*, BMPR1A*, BRCA1*, BRCA2*, BRIP1*, CDC73*, CDH1*, CDK4, CDKN1B*, CDKN2A, CHEK2*, CTNNA1*, DICER1*, EPCAM, EGFR, FH*, FLCN*, GREM1, HOXB13, KIT, LZTR1, MAX*, MBD4, MEN1*, MET, MITF, MLH1*, MSH2*, MSH3*, MSH6*, MUTYH*, NF1*, NF2*, NTHL1*, PALB2*, PDGFRA, PMS2*, POLD1*, POLE*, POT1*, PRKAR1A*, PTCH1*, PTEN*, RAD51C*, RAD51D*, RB1*, RET, SDHA*, SDHAF2*, SDHB*, SDHC*, SDHD*, SMAD4*,  SMARCA4*, SMARCB1*, SMARCE1*, STK11*, SUFU*, TMEM127*, TP53*, TSC1*, TSC2*, VHL*. RNA analysis is performed for * genes.   11/24/2022 - 11/25/2022 Hospital Admission   Admitted due to acute pulmonary embolism, lower extremity US  negative for DVT.  Patietn was started on heparin  gtt and transitioned to Eliquis  at discharge.    02/10/2023 Procedure   S/p port placement.    03/10/2023 Imaging   MRI bilateral breast w wo contrast  No abnormal enhancement in the left breast consistent with response to neoadjuvant chemotherapy.   07/24/2023 Surgery   Patient underwent bilateral mastectomy 1. Breast, simple mastectomy, left, long stitch lateral, short stitch superior :      - CHANGES CONSISTENT WITH NEOADJUVANT THERAPY, NEGATIVE FOR RESIDUAL MALIGNANCY.      - SEE NOTE.      - SCLEROSING ADENOSIS AND SECRETORY CHANGE.      - BIOPSY SITE CHANGE WITH CLIP.      - UNREMARKABLE NIPPLE AND SKIN.       2. Breast, simple mastectomy, right, long stitch lateral, short stitch superior :      - SCLEROSING ADENOSIS, FIBROUS STROMA, AND FIBROADENOMATOID CHANGE.      - UNREMARKABLE NIPPLE AND SKIN.      - NEGATIVE FOR ATYPIA AND MALIGNANCY.       3. Lymph node, sentinel, biopsy, left axillary #1 :      -  TWO LYMPH NODES NEGATIVE FOR MALIGNANCY (0/2).      - SEE NOTE.      - BIOPSY SITE CHANGE INVOLVING ONE LYMPH NODE.      - FIBROUS SCARRING / CHANGES SUGGESTIVE OF NEOADJUVANT THERAPY ARE NOT      IDENTIFIED.       4. Breast, excision, left lateral margin :      - UNREMARKABLE BREAST TISSUE.      - NEGATIVE FOR ATYPIA AND MALIGNANCY.    08/22/2023 -  Chemotherapy   Patient is on Treatment Plan : BREAST Pembrolizumab  (200) q21d x 27 weeks      Today she presents for evaluation prior to chemotherapy She tolerates chemotherapy.  On Eliquis  5mg  BID for PE. S/p bilateral mastectomy, there is plan of plastic surgery at end of July.  She has established care with Radonc and will be offered post  mastectomy radiation.    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   Vascular port complication     SURGICAL HISTORY: Past Surgical History:  Procedure Laterality Date   BREAST BIOPSY Right 05/20/2019   Affirm Bx- X- clip, neg   BREAST BIOPSY Left 10/02/2022   US  Bx, path pending   BREAST BIOPSY Left 10/02/2022   Us  Bx Node- path pending   BREAST BIOPSY Left 10/02/2022   US  LT BREAST BX W LOC DEV 1ST LESION IMG BX SPEC US  GUIDE 10/02/2022 [facility]   BREAST RECONSTRUCTION Bilateral 07/24/2023   Procedure: RECONSTRUCTION, BREAST;  Surgeon: Lowery Estefana RAMAN, DO;  Location: [facility];  Service: Plastics;  Laterality: Bilateral;  BILATERAL IMMEDIATE BREAST RECONSTRUCTION WITH EXPANDER AND FLEX HD PLACEMENT   CESAREAN SECTION     x 2   LAPAROSCOPIC BILATERAL SALPINGECTOMY Bilateral 02/09/2022   Procedure: LAPAROSCOPIC BILATERAL SALPINGECTOMY;  Surgeon: Connell Davies, MD;  Location: [facility];  Service: Gynecology;  Laterality: Bilateral;   MASTECTOMY W/ SENTINEL NODE BIOPSY Bilateral 07/24/2023   Procedure: MASTECTOMY WITH SENTINEL LYMPH NODE BIOPSY;  Surgeon: Tye Millet, DO;  Location: [facility];  Service: General;  Laterality: Bilateral;  SN biopsy left only   PORTACATH PLACEMENT Right 10/17/2022   Procedure: INSERTION PORT-A-CATH;  Surgeon: Desiderio Schanz, MD;  Location: [facility];  Service: General;  Laterality: Right;   PORTACATH PLACEMENT Left 02/10/2023   Procedure: INSERTION PORT-A-CATH;  Surgeon: Tye Millet, DO;  Location: [facility];   Service: General;  Laterality: Left;   TEE WITHOUT CARDIOVERSION N/A 01/02/2023   Procedure: TRANSESOPHAGEAL ECHOCARDIOGRAM (TEE);  Surgeon: Perla Evalene PARAS, MD;  Location: [facility];  Service: Cardiovascular;  Laterality: N/A;   TUBAL LIGATION      SOCIAL HISTORY: Social History   Socioeconomic History   Marital status: Single    Spouse name: Not on file   Number of children: 2   Years of education: Not on file   Highest education level: High school graduate  Occupational History   Not on file  Tobacco Use   Smoking status: Never    Passive exposure: Never   Smokeless tobacco: Never  Vaping Use   Vaping status: Never Used  Substance and Sexual Activity   Alcohol use: Never   Drug use: No   Sexual activity: Yes    Birth control/protection: Surgical  Other Topics Concern   Not on file  Social History Narrative   Lives with mom and 2 kids   Social Drivers of Health   Financial Resource Strain: Low Risk  (04/16/2023)   [facility]   Overall Financial Resource Strain (CARDIA)    Difficulty of Paying Living Expenses: Not hard at all  Food Insecurity: No Food Insecurity (04/16/2023)   Received from [facility]   Hunger Vital Sign    Within the past 12 months, you worried that your food would run out before you got the money to buy more.: Never true    Within the past 12 months, the food you bought just didn't last and you didn't have money to get more.: Never true  Transportation Needs: No Transportation Needs (04/16/2023)   Received from [facility] - Transportation    In the past 12 months, has lack of transportation kept you from medical appointments or from getting medications?: No    Lack of Transportation (Non-Medical): No  Physical Activity: Not on file  Stress: No Stress Concern Present (10/10/2022)   [employer] [facility] [employer] - Occupational Stress Questionnaire    Feeling of  Stress : Only a little  Social Connections: Not on file  Intimate Partner Violence: Not At Risk (12/31/2022)   Humiliation, Afraid, Rape, and Kick questionnaire    Fear of Current or Ex-Partner: No    Emotionally Abused: No    Physically Abused: No    Sexually Abused: No    FAMILY HISTORY: Family History  Problem Relation Age of Onset   Breast cancer Mother 64       no GT   Breast cancer Maternal Aunt 33   Breast cancer Maternal Aunt    Lung cancer Maternal Uncle    Lung cancer Maternal Uncle    Bone cancer Paternal Aunt    Pancreatic cancer Maternal Grandmother 63   Lung cancer Maternal Grandfather 55   Breast cancer Cousin 40   Breast cancer Other 33       gene pos?    ALLERGIES:  has no known allergies.  MEDICATIONS:  Current Outpatient Medications  Medication Sig Dispense Refill   acetaminophen  (TYLENOL ) 500 MG tablet Take 2 tablets (1,000 mg total) by mouth every 6 (six) hours as needed for mild pain.     amoxicillin -clavulanate (AUGMENTIN ) 875-125 MG tablet Take 1 tablet by mouth 2 (two) times daily for 7 days. 14 tablet 0   apixaban  (ELIQUIS ) 5 MG TABS tablet Take 1 tablet (5 mg total) by mouth 2 (two) times daily. 90 tablet 0   diazepam  (VALIUM ) 2 MG tablet Take 1 tablet (2 mg total) by mouth every 12 (twelve) hours as needed for up to 20 doses for muscle spasms. 20 tablet 0   lidocaine -prilocaine  (EMLA ) cream Apply 1 Application topically as needed. 30 g 11   ondansetron  (ZOFRAN ) 4 MG tablet Take 1 tablet (4 mg total) by mouth every 8 (eight) hours as needed for up to 20 doses for nausea or vomiting. 20 tablet 0   oxyCODONE  (ROXICODONE ) 5 MG immediate release tablet Take 1 tablet (5  mg total) by mouth every 6 (six) hours as needed for up to 20 doses for severe pain (pain score 7-10). 20 tablet 0   senna (SENOKOT) 8.6 MG TABS tablet Take 2 tablets (17.2 mg total) by mouth daily. 120 tablet 2   simethicone  (MYLICON) 80 MG chewable tablet Chew 1 tablet (80 mg total) by  mouth every 6 (six) hours as needed for flatulence. 30 tablet 2   No current facility-administered medications for this visit.   Facility-Administered Medications Ordered in Other Visits  Medication Dose Route Frequency Provider Last Rate Last Admin   0.9 %  sodium chloride  infusion   Intravenous Continuous Babara Call, MD   Stopped at 08/22/23 1004    Review of Systems  Constitutional:  Negative for appetite change, chills, fatigue and fever.  HENT:   Negative for hearing loss and voice change.   Eyes:  Negative for eye problems.  Respiratory:  Negative for chest tightness and cough.   Cardiovascular:  Negative for chest pain.  Gastrointestinal:  Negative for abdominal distention, abdominal pain and blood in stool.  Endocrine: Negative for hot flashes.  Genitourinary:  Negative for difficulty urinating and frequency.   Musculoskeletal:  Negative for arthralgias.  Skin:  Negative for itching and rash.  Neurological:  Negative for extremity weakness and headaches.  Hematological:  Negative for adenopathy.  Psychiatric/Behavioral:  Negative for confusion.      PHYSICAL EXAMINATION: ECOG PERFORMANCE STATUS: 0 - Asymptomatic  Vitals:   08/22/23 0826  BP: 118/63  Pulse: 64  Resp: 16  Temp: 97.8 F (36.6 C)  SpO2: 100%   Filed Weights   08/22/23 0826  Weight: 151 lb (68.5 kg)    Physical Exam Constitutional:      General: She is not in acute distress.    Appearance: She is not diaphoretic.  HENT:     Head: Normocephalic and atraumatic.  Eyes:     General: No scleral icterus. Cardiovascular:     Rate and Rhythm: Normal rate and regular rhythm.  Pulmonary:     Effort: Pulmonary effort is normal. No respiratory distress.     Breath sounds: No wheezing.  Abdominal:     General: Bowel sounds are normal. There is no distension.     Palpations: Abdomen is soft.  Musculoskeletal:        General: Normal range of motion.     Cervical back: Normal range of motion and neck  supple.  Skin:    General: Skin is warm and dry.     Findings: No erythema.     Comments: Left anterior chest wall medi port  Neurological:     Mental Status: She is alert and oriented to person, place, and time. Mental status is at baseline.     Motor: No abnormal muscle tone.  Psychiatric:        Mood and Affect: Mood and affect normal.     LABORATORY DATA:  I have reviewed the data as listed    Latest Ref Rng & Units 08/22/2023    8:10 AM 08/17/2023   12:19 AM 07/25/2023    4:37 AM  CBC  WBC 4.0 - 10.5 K/uL 4.0  5.9  8.8   Hemoglobin 12.0 - 15.0 g/dL 87.3  87.8  88.2   Hematocrit 36.0 - 46.0 % 38.7  38.2  36.3   Platelets 150 - 400 K/uL 205  247  168       Latest Ref Rng & Units 08/22/2023  8:10 AM 08/17/2023   12:19 AM 07/25/2023    4:37 AM  CMP  Glucose 70 - 99 mg/dL 98  98  885   BUN 6 - 20 mg/dL 8  13  7    Creatinine 0.44 - 1.00 mg/dL 9.27  9.34  9.46   Sodium 135 - 145 mmol/L 137  137  140   Potassium 3.5 - 5.1 mmol/L 3.9  3.9  3.9   Chloride 98 - 111 mmol/L 107  106  109   CO2 22 - 32 mmol/L 24  24  27    Calcium  8.9 - 10.3 mg/dL 8.6  8.7  8.9   Total Protein 6.5 - 8.1 g/dL 6.5  6.9    Total Bilirubin 0.0 - 1.2 mg/dL 0.4  0.3    Alkaline Phos 38 - 126 U/L 90  90    AST 15 - 41 U/L 27  32    ALT 0 - 44 U/L 24  30       RADIOGRAPHIC STUDIES: I have personally reviewed the radiological images as listed and agreed with the findings in the report. CT Chest W Contrast Result Date: 08/17/2023 CLINICAL DATA:  Post mastectomy with pain at the left drain site. EXAM: CT CHEST WITH CONTRAST TECHNIQUE: Multidetector CT imaging of the chest was performed during intravenous contrast administration. RADIATION DOSE REDUCTION: This exam was performed according to the departmental dose-optimization program which includes automated exposure control, adjustment of the mA and/or kV according to patient size and/or use of iterative reconstruction technique. CONTRAST:  75mL OMNIPAQUE   IOHEXOL  300 MG/ML  SOLN COMPARISON:  Chest radiograph 04/04/2023.  CT chest 11/24/2022 FINDINGS: Cardiovascular: Normal heart size. No pericardial effusions. Normal caliber thoracic aorta. No dissection. Great vessel origins are patent. Mediastinum/Nodes: Soft tissue density in the anterior mediastinum likely represents residual thymic tissue. Esophagus is decompressed. No significant lymphadenopathy. Thyroid  gland is unremarkable. Central venous catheter with tip in the low SVC. Lungs/Pleura: Focal linear scarring in the right lung base. Lungs are otherwise clear and expanded. No pleural effusion or pneumothorax. Upper Abdomen: Mild diffuse fatty infiltration of the liver. No acute abnormalities identified. Musculoskeletal: No acute bony abnormalities. Postoperative changes with bilateral breast implants. Streak artifact from metallic components limits evaluation. Visualized implants appear intact. Bilateral soft tissue drains are demonstrated in the subcutaneous fat extending from lateral to medial inferior to the implants. There is evidence of mild soft tissue edema over the implants bilaterally, more prominent on the left. This could be postoperative change and/or cellulitis. No loculated collections are demonstrated to suggest abscess. Ultrasound may be more sensitive for evaluation of small collections if clinically indicated. IMPRESSION: 1. No evidence of active pulmonary disease. 2. Bilateral breast implants with subcutaneous soft tissue drains in place. Mild edema in subcutaneous fat bilaterally but more prominent on the left. This is probably postoperative but could indicate cellulitis in the appropriate clinical setting. Correlate with physical examination. No loculated collection to suggest an abscess. 3. Incidental note of fatty infiltration of the liver. Electronically Signed   By: Elsie Gravely M.D.   On: 08/17/2023 01:41   NM Sentinel Node Inj-No Rpt (Breast) Result Date: 07/24/2023 Lymphoseek  was injected by the Nuclear Medicine Technologist for sentinel lymph node localization.

## 2023-08-22 NOTE — Patient Instructions (Signed)
 [facility] - A DEPT OF [facility]  Discharge Instructions: Thank you for choosing [facility] to provide your oncology and hematology care.  If you have a lab appointment with the Cancer Center, please go directly to the Cancer Center and check in at the registration area.  Wear comfortable clothing and clothing appropriate for easy access to any Portacath or PICC line.   We strive to give you quality time with your provider. You may need to reschedule your appointment if you arrive late (15 or more minutes).  Arriving late affects you and other patients whose appointments are after yours.  Also, if you miss three or more appointments without notifying the office, you may be dismissed from the clinic at the provider's discretion.      For prescription refill requests, have your pharmacy contact our office and allow 72 hours for refills to be completed.    Today you received the following chemotherapy and/or immunotherapy agents KEYTRUDA      To help prevent nausea and vomiting after your treatment, we encourage you to take your nausea medication as directed.  BELOW ARE SYMPTOMS THAT SHOULD BE REPORTED IMMEDIATELY: *FEVER GREATER THAN 100.4 F (38 C) OR HIGHER *CHILLS OR SWEATING *NAUSEA AND VOMITING THAT IS NOT CONTROLLED WITH YOUR NAUSEA MEDICATION *UNUSUAL SHORTNESS OF BREATH *UNUSUAL BRUISING OR BLEEDING *URINARY PROBLEMS (pain or burning when urinating, or frequent urination) *BOWEL PROBLEMS (unusual diarrhea, constipation, pain near the anus) TENDERNESS IN MOUTH AND THROAT WITH OR WITHOUT PRESENCE OF ULCERS (sore throat, sores in mouth, or a toothache) UNUSUAL RASH, SWELLING OR PAIN  UNUSUAL VAGINAL DISCHARGE OR ITCHING   Items with * indicate a potential emergency and should be followed up as soon as possible or go to the Emergency Department if any problems should occur.  Please show the CHEMOTHERAPY ALERT CARD or IMMUNOTHERAPY  ALERT CARD at check-in to the Emergency Department and triage nurse.  Should you have questions after your visit or need to cancel or reschedule your appointment, please contact [facility] - A DEPT OF [facility] [place] [facility]  [phone] and follow the prompts.  Office hours are 8:00 a.m. to 4:30 p.m. Monday - Friday. Please note that voicemails left after 4:00 p.m. may not be returned until the following business day.  We are closed weekends and major holidays. You have access to a nurse at all times for urgent questions. Please call the main number to the clinic [phone] and follow the prompts.  For any non-urgent questions, you may also contact your provider using MyChart. We now offer e-Visits for anyone 71 and older to request care online for non-urgent symptoms. For details visit [email or web address].   Also download the MyChart app! Go to the app store, search "MyChart", open the app, select [place], and log in with your MyChart username and password.   Pembrolizumab Injection What is this medication? PEMBROLIZUMAB (PEM broe LIZ ue mab) treats some types of cancer. It works by helping your immune system slow or stop the spread of cancer cells. It is a monoclonal antibody. This medicine may be used for other purposes; ask your health care provider or pharmacist if you have questions. COMMON BRAND NAME(S): Keytruda What should I tell my care team before I take this medication? They need to know if you have any of these conditions: Allogeneic stem cell transplant (uses someone else's stem cells) Autoimmune diseases, such as Crohn  disease, ulcerative colitis, lupus History of chest radiation Nervous system problems, such as Guillain-Barre syndrome, myasthenia gravis Organ transplant An unusual or allergic reaction to pembrolizumab, other medications, foods, dyes, or preservatives Pregnant or trying to get pregnant Breast-feeding How should I use this  medication? This medication is injected into a vein. It is given by your care team in a hospital or clinic setting. A special MedGuide will be given to you before each treatment. Be sure to read this information carefully each time. Talk to your care team about the use of this medication in children. While it may be prescribed for children as young as 6 months for selected conditions, precautions do apply. Overdosage: If you think you have taken too much of this medicine contact a poison control center or emergency room at once. NOTE: This medicine is only for you. Do not share this medicine with others. What if I miss a dose? Keep appointments for follow-up doses. It is important not to miss your dose. Call your care team if you are unable to keep an appointment. What may interact with this medication? Interactions have not been studied. This list may not describe all possible interactions. Give your health care provider a list of all the medicines, herbs, non-prescription drugs, or dietary supplements you use. Also tell them if you smoke, drink alcohol, or use illegal drugs. Some items may interact with your medicine. What should I watch for while using this medication? Your condition will be monitored carefully while you are receiving this medication. You may need blood work while taking this medication. This medication may cause serious skin reactions. They can happen weeks to months after starting the medication. Contact your care team right away if you notice fevers or flu-like symptoms with a rash. The rash may be red or purple and then turn into blisters or peeling of the skin. You may also notice a red rash with swelling of the face, lips, or lymph nodes in your neck or under your arms. Tell your care team right away if you have any change in your eyesight. Talk to your care team if you may be pregnant. Serious birth defects can occur if you take this medication during pregnancy and for 4  months after the last dose. You will need a negative pregnancy test before starting this medication. Contraception is recommended while taking this medication and for 4 months after the last dose. Your care team can help you find the option that works for you. Do not breastfeed while taking this medication and for 4 months after the last dose. What side effects may I notice from receiving this medication? Side effects that you should report to your care team as soon as possible: Allergic reactions--skin rash, itching, hives, swelling of the face, lips, tongue, or throat Dry cough, shortness of breath or trouble breathing Eye pain, redness, irritation, or discharge with blurry or decreased vision Heart muscle inflammation--unusual weakness or fatigue, shortness of breath, chest pain, fast or irregular heartbeat, dizziness, swelling of the ankles, feet, or hands Hormone gland problems--headache, sensitivity to light, unusual weakness or fatigue, dizziness, fast or irregular heartbeat, increased sensitivity to cold or heat, excessive sweating, constipation, hair loss, increased thirst or amount of urine, tremors or shaking, irritability Infusion reactions--chest pain, shortness of breath or trouble breathing, feeling faint or lightheaded Kidney injury (glomerulonephritis)--decrease in the amount of urine, red or dark brown urine, foamy or bubbly urine, swelling of the ankles, hands, or feet Liver injury--right upper belly pain,  loss of appetite, nausea, light-colored stool, dark yellow or brown urine, yellowing skin or eyes, unusual weakness or fatigue Pain, tingling, or numbness in the hands or feet, muscle weakness, change in vision, confusion or trouble speaking, loss of balance or coordination, trouble walking, seizures Rash, fever, and swollen lymph nodes Redness, blistering, peeling, or loosening of the skin, including inside the mouth Sudden or severe stomach pain, bloody diarrhea, fever, nausea,  vomiting Side effects that usually do not require medical attention (report to your care team if they continue or are bothersome): Bone, joint, or muscle pain Diarrhea Fatigue Loss of appetite Nausea Skin rash This list may not describe all possible side effects. Call your doctor for medical advice about side effects. You may report side effects to FDA at 1-800-FDA-1088. Where should I keep my medication? This medication is given in a hospital or clinic. It will not be stored at home. NOTE: This sheet is a summary. It may not cover all possible information. If you have questions about this medicine, talk to your doctor, pharmacist, or health care provider.  2024 Elsevier/Gold Standard (2021-06-12 00:00:00)

## 2023-08-22 NOTE — Assessment & Plan Note (Signed)
 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  S/p neoadjuvant chemotherapy with Carboplatin , taxol  Keytruda  Q3 weeks x 4 cycle  followed by [facility] + Keytrua with D4 GCSF x 4 cycles.  S/p bilateral mastectomy, no residual disease.  Patient has BRCA 1 mutation, no residual disease, does not qualify Olarparib eligibility.  Recommend adjuvant Keytrya every 3 weeks for 9 cycles.  Labs are reviewed and discussed with patient. Proceed with Keytruda .  She will get plastic surgery at end of July followed by Radiation.

## 2023-08-23 LAB — T4: T4, Total: 6.4 ug/dL (ref 4.5–12.0)

## 2023-08-25 ENCOUNTER — Other Ambulatory Visit: Payer: Self-pay | Admitting: Oncology

## 2023-08-26 ENCOUNTER — Ambulatory Visit (INDEPENDENT_AMBULATORY_CARE_PROVIDER_SITE_OTHER): Admitting: Surgical

## 2023-08-26 ENCOUNTER — Encounter: Payer: Self-pay | Admitting: Surgical

## 2023-08-26 VITALS — BP 113/77 | HR 80 | Ht 62.0 in | Wt 150.2 lb

## 2023-08-26 DIAGNOSIS — Z1509 Genetic susceptibility to other malignant neoplasm: Secondary | ICD-10-CM

## 2023-08-26 DIAGNOSIS — C50919 Malignant neoplasm of unspecified site of unspecified female breast: Secondary | ICD-10-CM

## 2023-08-26 DIAGNOSIS — Z1501 Genetic susceptibility to malignant neoplasm of breast: Secondary | ICD-10-CM

## 2023-08-26 DIAGNOSIS — I2699 Other pulmonary embolism without acute cor pulmonale: Secondary | ICD-10-CM

## 2023-08-26 DIAGNOSIS — Z9889 Other specified postprocedural states: Secondary | ICD-10-CM

## 2023-08-26 MED ORDER — CEPHALEXIN 500 MG PO CAPS: 500.0000 mg | ORAL_CAPSULE | Freq: Four times a day (QID) | ORAL | 0 refills | Status: AC

## 2023-08-26 NOTE — Progress Notes (Signed)
 Patient ID: Penny Gomez, female    DOB: 29-Apr-1985, 38 y.o.   MRN: [id number]  Chief Complaint  Patient presents with   Pre-op Exam      ICD-10-CM   1. S/P breast reconstruction  Z98.890     2. Invasive carcinoma of breast (HCC)  C50.919     3. BRCA1 gene mutation positive  Z15.01    Z15.09     4. Pulmonary infarct (HCC)  I26.99       History of Present Illness: Penny Gomez is a 38 y.o.  female  with a history of breast cancer and bilateral mastectomy with expander placement .  She presents for preoperative evaluation for upcoming procedure, removal of bilateral breast tissue expanders and placement of bilateral breast silicone implants, scheduled for 09/11/23 with Dr. Lowery.  The patient has not had problems with anesthesia. No family history of DVT/PE.  No family or personal history of bleeding or clotting disorders.  Patient is not currently taking any blood thinners.  No history of CVA/MI.  She has a history of a pulmonary embolism while on chemotherapy, she recently saw oncology last week and was informed to stop Eliquis .  She completed course of Eliquis .  She reports that she has an appointment with radiation oncology on 09/29/2023 after surgery to follow-up and discuss beginning radiation on the left breast.  Summary of Previous Visit: She currently has 300 cc out of 535 cc in each tissue expander.  Job: [employer]  PMH Significant for: PE on Eliquis , BRCA1 s/p b/l mastectomy and recon with tissue expanders in place, endocarditis, MSSA bacteremia, PNA (multifocal), sepsis from port a cath, triple negative LEFT breast cancer  Currently receiving keytruda  infusions, reports first dose was on 08/22/2023.  She reports that she spoke with oncology and they will hold off on any additional doses until after surgery.  Echo 03/2023 normal EF, normal diastolic function. CBC 08/22/2023 normal.  She has been feeling well lately with no recent changes to  her health.  She does not have any cardiac or pulmonary symptoms.  Palpitations have resolved.  She did see cardiology and obtain clearance from them, however did not wear cardiac monitor due to needing surgery for mastectomy.  Past Medical History: Allergies: No Known Allergies  Current Medications:  Current Outpatient Medications:    acetaminophen  (TYLENOL ) 500 MG tablet, Take 2 tablets (1,000 mg total) by mouth every 6 (six) hours as needed for mild pain., Disp: , Rfl:    senna (SENOKOT) 8.6 MG TABS tablet, Take 2 tablets (17.2 mg total) by mouth daily., Disp: 120 tablet, Rfl: 2   apixaban  (ELIQUIS ) 5 MG TABS tablet, Take 1 tablet (5 mg total) by mouth 2 (two) times daily. (Patient not taking: Reported on 08/26/2023), Disp: 90 tablet, Rfl: 0   diazepam  (VALIUM ) 2 MG tablet, Take 1 tablet (2 mg total) by mouth every 12 (twelve) hours as needed for up to 20 doses for muscle spasms. (Patient not taking: Reported on 08/26/2023), Disp: 20 tablet, Rfl: 0   lidocaine -prilocaine  (EMLA ) cream, Apply 1 Application topically as needed. (Patient not taking: Reported on 08/26/2023), Disp: 30 g, Rfl: 11   ondansetron  (ZOFRAN ) 4 MG tablet, Take 1 tablet (4 mg total) by mouth every 8 (eight) hours as needed for up to 20 doses for nausea or vomiting. (Patient not taking: Reported on 08/26/2023), Disp: 20 tablet, Rfl: 0   oxyCODONE  (ROXICODONE ) 5 MG immediate release tablet, Take 1 tablet (5 mg  total) by mouth every 6 (six) hours as needed for up to 20 doses for severe pain (pain score 7-10). (Patient not taking: Reported on 08/26/2023), Disp: 20 tablet, Rfl: 0   simethicone  (MYLICON) 80 MG chewable tablet, Chew 1 tablet (80 mg total) by mouth every 6 (six) hours as needed for flatulence. 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)    Microcytic anemia    MSSA bacteremia    Multifocal pneumonia    Sepsis (HCC)    due to port a cath   STD (sexually transmitted disease)    From Medical Hx;   Thrombocytopenia (HCC)    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   Vascular port complication     Past Surgical History: Past Surgical History:  Procedure Laterality Date   BREAST BIOPSY Right 05/20/2019   Affirm Bx- X- clip, neg   BREAST BIOPSY Left 10/02/2022   US  Bx, path pending   BREAST BIOPSY Left 10/02/2022   Us  Bx Node- path pending   BREAST BIOPSY Left 10/02/2022   US  LT BREAST BX W LOC DEV 1ST LESION IMG BX SPEC US  GUIDE 10/02/2022 [facility]   BREAST RECONSTRUCTION Bilateral 07/24/2023   Procedure: RECONSTRUCTION, BREAST;  Surgeon: Lowery Estefana RAMAN, DO;  Location: [facility];  Service: Plastics;  Laterality: Bilateral;  BILATERAL IMMEDIATE BREAST RECONSTRUCTION WITH EXPANDER AND FLEX HD PLACEMENT   CESAREAN SECTION     x 2   LAPAROSCOPIC BILATERAL SALPINGECTOMY Bilateral 02/09/2022   Procedure: LAPAROSCOPIC BILATERAL SALPINGECTOMY;  Surgeon: Connell Davies, MD;  Location: [facility];  Service: Gynecology;  Laterality: Bilateral;   MASTECTOMY W/ SENTINEL NODE BIOPSY Bilateral 07/24/2023   Procedure: MASTECTOMY WITH SENTINEL LYMPH NODE BIOPSY;  Surgeon: Tye Millet, DO;  Location: [facility];  Service: General;  Laterality: Bilateral;  SN biopsy left only   PORTACATH PLACEMENT Right 10/17/2022   Procedure: INSERTION PORT-A-CATH;  Surgeon: Desiderio Schanz, MD;  Location: [facility];  Service: General;  Laterality: Right;   PORTACATH PLACEMENT Left 02/10/2023   Procedure: INSERTION PORT-A-CATH;  Surgeon: Tye Millet, DO;  Location: [facility];  Service: General;  Laterality: Left;   TEE WITHOUT CARDIOVERSION N/A 01/02/2023   Procedure: TRANSESOPHAGEAL ECHOCARDIOGRAM (TEE);  Surgeon: Perla Evalene PARAS, MD;   Location: [facility];  Service: Cardiovascular;  Laterality: N/A;   TUBAL LIGATION      Social History: Social History   Socioeconomic History   Marital status: Single    Spouse name: Not on file   Number of children: 2   Years of education: Not on file   Highest education level: High school graduate  Occupational History   Not on file  Tobacco Use   Smoking status: Never    Passive exposure: Never   Smokeless tobacco: Never  Vaping Use   Vaping status: Never Used  Substance and Sexual Activity   Alcohol use: Never   Drug use: No   Sexual activity: Yes    Birth control/protection: Surgical  Other Topics Concern   Not on file  Social History Narrative   Lives with mom and 2 kids   Social Drivers of Health   Financial Resource Strain: Low Risk  (04/16/2023)  Received [facility]   Overall Financial Resource Strain (CARDIA)    Difficulty of Paying Living Expenses: Not hard at all  Food Insecurity: No Food Insecurity (04/16/2023)   Received from [facility]   Hunger Vital Sign    Within the past 12 months, you worried that your food would run out before you got the money to buy more.: Never true    Within the past 12 months, the food you bought just didn't last and you didn't have money to get more.: Never true  Transportation Needs: No Transportation Needs (04/16/2023)   [facility]    In the past 12 months, has lack of transportation kept you from medical appointments or from getting medications?: No    Lack of Transportation (Non-Medical): No  Physical Activity: Not on file  Stress: No Stress Concern Present (10/10/2022)   [employer] of Occupational Health - Occupational Stress Questionnaire    Feeling of Stress : Only a little  Social Connections: Not on file  Intimate Partner Violence: Not At Risk (12/31/2022)   Humiliation, Afraid, Rape, and Kick questionnaire     Fear of Current or Ex-Partner: No    Emotionally Abused: No    Physically Abused: No    Sexually Abused: No    Family History: Family History  Problem Relation Age of Onset   Breast cancer Mother 59       no GT   Breast cancer Maternal Aunt 33   Breast cancer Maternal Aunt    Lung cancer Maternal Uncle    Lung cancer Maternal Uncle    Bone cancer Paternal Aunt    Pancreatic cancer Maternal Grandmother 57   Lung cancer Maternal Grandfather 63   Breast cancer Cousin 40   Breast cancer Other 33       gene pos?    Review of Systems: Review of Systems  Constitutional: Negative.   Respiratory:  Negative for shortness of breath.   Cardiovascular:  Negative for chest pain, palpitations and leg swelling.  Gastrointestinal: Negative.   Genitourinary: Negative.   Neurological: Negative.     Physical Exam: Vital Signs BP 113/77 (BP Location: Left Arm, Patient Position: Sitting, Cuff Size: Normal)   Pulse 80   Ht 5' 2 (1.575 m)   Wt 150 lb 3.2 oz (68.1 kg)   SpO2 97%   BMI 27.47 kg/m   Physical Exam Constitutional:      General: Not in acute distress.    Appearance: Normal appearance. Not ill-appearing.  HENT:     Head: Normocephalic and atraumatic.  Eyes:     Pupils: Pupils are equal, round Neck:     Musculoskeletal: Normal range of motion.  Cardiovascular:     Rate and Rhythm: Normal rate    Pulses: Normal pulses.  Pulmonary:     Effort: Pulmonary effort is normal. No respiratory distress.  Abdominal:     General: Abdomen is flat. There is no distension.  Musculoskeletal: Normal range of motion.  Skin:    General: Skin is warm and dry.     Findings: No erythema or rash.  Port-A-Cath in place Neurological:     General: No focal deficit present.     Mental Status: Alert and oriented to person, place, and time. Mental status is at baseline.     Motor: No weakness.  Psychiatric:        Mood and Affect: Mood normal.  Behavior: Behavior normal.     Assessment/Plan: The patient is scheduled for removal of bilateral breast tissue expanders and placement of bilateral breast silicone implants with Dr. Lowery.  Risks, benefits, and alternatives of procedure discussed, questions answered and consent obtained.    Smoking Status: Non-smoker; Counseling Given?  N/A  Caprini Score: 10; Risk Factors include: Hx of PE while on Chemo, Hx of breast cancer,, BMI > 25, and length of planned surgery. Recommendation for mechanical and possible pharmacological prophylaxis. Encourage early ambulation. Patient was on eliquis , but recently stopped per guidance from oncology. Patient's PE was likely provoked by chemotherapy per oncology, will discuss postoperative Lovenox  for 1 week with Dr. Lowery.  Patient did have workup by cardiology, she reports she did not have her long-term monitor due to surgery being scheduled for her mastectomy.  But she has not had any ongoing palpitations.  She has not had any other cardiac or pulmonary symptoms.  She denies palpitations, shortness of breath, chest pain.  Pictures obtained: previous visit  Post-op Rx sent to pharmacy: Keflex   Patient was provided with the General Surgical Risk consent document and Pain Medication Agreement prior to their appointment.  They had adequate time to read through the risk consent documents and Pain Medication Agreement. We also discussed them in person together during this preop appointment. All of their questions were answered to their satisfaction.  Recommended calling if they have any further questions.  Risk consent form and Pain Medication Agreement to be scanned into patient's chart.  Patient was provided with the Mentor implant patient decision checklist and this was completed during today's preoperative evaluation. Patient had time to read through the information and any questions were answered to their content. Form will be scanned into patient's chart.  The risks that  can be encountered with and after placement of a breast implant were discussed and include the following but not limited to these: bleeding, infection, delayed healing, anesthesia risks, skin sensation changes, injury to structures including nerves, blood vessels, and muscles which may be temporary or permanent, allergies to tape, suture materials and glues, blood products, topical preparations or injected agents, skin contour irregularities, skin discoloration and swelling, deep vein thrombosis, cardiac and pulmonary complications, pain, which may persist, fluid accumulation, wrinkling of the skin over the implanmt, changes in nipple or breast sensation, implant leakage or rupture, faulty position of the implant, persistent pain, formation of tight scar tissue around the implant (capsular contracture).   Electronically signed by: Donnice PARAS Issac Moure, PA-C 08/26/2023 9:02 AM

## 2023-08-26 NOTE — H&P (View-Only) (Signed)
 Patient ID: Penny Gomez, female    DOB: 29-Apr-1985, 38 y.o.   MRN: [id number]  Chief Complaint  Patient presents with   Pre-op Exam      ICD-10-CM   1. S/P breast reconstruction  Z98.890     2. Invasive carcinoma of breast (HCC)  C50.919     3. BRCA1 gene mutation positive  Z15.01    Z15.09     4. Pulmonary infarct (HCC)  I26.99       History of Present Illness: Penny Gomez is a 38 y.o.  female  with a history of breast cancer and bilateral mastectomy with expander placement .  She presents for preoperative evaluation for upcoming procedure, removal of bilateral breast tissue expanders and placement of bilateral breast silicone implants, scheduled for 09/11/23 with Dr. Lowery.  The patient has not had problems with anesthesia. No family history of DVT/PE.  No family or personal history of bleeding or clotting disorders.  Patient is not currently taking any blood thinners.  No history of CVA/MI.  She has a history of a pulmonary embolism while on chemotherapy, she recently saw oncology last week and was informed to stop Eliquis .  She completed course of Eliquis .  She reports that she has an appointment with radiation oncology on 09/29/2023 after surgery to follow-up and discuss beginning radiation on the left breast.  Summary of Previous Visit: She currently has 300 cc out of 535 cc in each tissue expander.  Job: [employer]  PMH Significant for: PE on Eliquis , BRCA1 s/p b/l mastectomy and recon with tissue expanders in place, endocarditis, MSSA bacteremia, PNA (multifocal), sepsis from port a cath, triple negative LEFT breast cancer  Currently receiving keytruda  infusions, reports first dose was on 08/22/2023.  She reports that she spoke with oncology and they will hold off on any additional doses until after surgery.  Echo 03/2023 normal EF, normal diastolic function. CBC 08/22/2023 normal.  She has been feeling well lately with no recent changes to  her health.  She does not have any cardiac or pulmonary symptoms.  Palpitations have resolved.  She did see cardiology and obtain clearance from them, however did not wear cardiac monitor due to needing surgery for mastectomy.  Past Medical History: Allergies: No Known Allergies  Current Medications:  Current Outpatient Medications:    acetaminophen  (TYLENOL ) 500 MG tablet, Take 2 tablets (1,000 mg total) by mouth every 6 (six) hours as needed for mild pain., Disp: , Rfl:    senna (SENOKOT) 8.6 MG TABS tablet, Take 2 tablets (17.2 mg total) by mouth daily., Disp: 120 tablet, Rfl: 2   apixaban  (ELIQUIS ) 5 MG TABS tablet, Take 1 tablet (5 mg total) by mouth 2 (two) times daily. (Patient not taking: Reported on 08/26/2023), Disp: 90 tablet, Rfl: 0   diazepam  (VALIUM ) 2 MG tablet, Take 1 tablet (2 mg total) by mouth every 12 (twelve) hours as needed for up to 20 doses for muscle spasms. (Patient not taking: Reported on 08/26/2023), Disp: 20 tablet, Rfl: 0   lidocaine -prilocaine  (EMLA ) cream, Apply 1 Application topically as needed. (Patient not taking: Reported on 08/26/2023), Disp: 30 g, Rfl: 11   ondansetron  (ZOFRAN ) 4 MG tablet, Take 1 tablet (4 mg total) by mouth every 8 (eight) hours as needed for up to 20 doses for nausea or vomiting. (Patient not taking: Reported on 08/26/2023), Disp: 20 tablet, Rfl: 0   oxyCODONE  (ROXICODONE ) 5 MG immediate release tablet, Take 1 tablet (5 mg  total) by mouth every 6 (six) hours as needed for up to 20 doses for severe pain (pain score 7-10). (Patient not taking: Reported on 08/26/2023), Disp: 20 tablet, Rfl: 0   simethicone  (MYLICON) 80 MG chewable tablet, Chew 1 tablet (80 mg total) by mouth every 6 (six) hours as needed for flatulence. 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)    Microcytic anemia    MSSA bacteremia    Multifocal pneumonia    Sepsis (HCC)    due to port a cath   STD (sexually transmitted disease)    From Medical Hx;   Thrombocytopenia (HCC)    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   Vascular port complication     Past Surgical History: Past Surgical History:  Procedure Laterality Date   BREAST BIOPSY Right 05/20/2019   Affirm Bx- X- clip, neg   BREAST BIOPSY Left 10/02/2022   US  Bx, path pending   BREAST BIOPSY Left 10/02/2022   Us  Bx Node- path pending   BREAST BIOPSY Left 10/02/2022   US  LT BREAST BX W LOC DEV 1ST LESION IMG BX SPEC US  GUIDE 10/02/2022 [facility]   BREAST RECONSTRUCTION Bilateral 07/24/2023   Procedure: RECONSTRUCTION, BREAST;  Surgeon: Lowery Estefana RAMAN, DO;  Location: [facility];  Service: Plastics;  Laterality: Bilateral;  BILATERAL IMMEDIATE BREAST RECONSTRUCTION WITH EXPANDER AND FLEX HD PLACEMENT   CESAREAN SECTION     x 2   LAPAROSCOPIC BILATERAL SALPINGECTOMY Bilateral 02/09/2022   Procedure: LAPAROSCOPIC BILATERAL SALPINGECTOMY;  Surgeon: Connell Davies, MD;  Location: [facility];  Service: Gynecology;  Laterality: Bilateral;   MASTECTOMY W/ SENTINEL NODE BIOPSY Bilateral 07/24/2023   Procedure: MASTECTOMY WITH SENTINEL LYMPH NODE BIOPSY;  Surgeon: Tye Millet, DO;  Location: [facility];  Service: General;  Laterality: Bilateral;  SN biopsy left only   PORTACATH PLACEMENT Right 10/17/2022   Procedure: INSERTION PORT-A-CATH;  Surgeon: Desiderio Schanz, MD;  Location: [facility];  Service: General;  Laterality: Right;   PORTACATH PLACEMENT Left 02/10/2023   Procedure: INSERTION PORT-A-CATH;  Surgeon: Tye Millet, DO;  Location: [facility];  Service: General;  Laterality: Left;   TEE WITHOUT CARDIOVERSION N/A 01/02/2023   Procedure: TRANSESOPHAGEAL ECHOCARDIOGRAM (TEE);  Surgeon: Perla Evalene PARAS, MD;   Location: [facility];  Service: Cardiovascular;  Laterality: N/A;   TUBAL LIGATION      Social History: Social History   Socioeconomic History   Marital status: Single    Spouse name: Not on file   Number of children: 2   Years of education: Not on file   Highest education level: High school graduate  Occupational History   Not on file  Tobacco Use   Smoking status: Never    Passive exposure: Never   Smokeless tobacco: Never  Vaping Use   Vaping status: Never Used  Substance and Sexual Activity   Alcohol use: Never   Drug use: No   Sexual activity: Yes    Birth control/protection: Surgical  Other Topics Concern   Not on file  Social History Narrative   Lives with mom and 2 kids   Social Drivers of Health   Financial Resource Strain: Low Risk  (04/16/2023)  Received [facility]   Overall Financial Resource Strain (CARDIA)    Difficulty of Paying Living Expenses: Not hard at all  Food Insecurity: No Food Insecurity (04/16/2023)   Received from [facility]   Hunger Vital Sign    Within the past 12 months, you worried that your food would run out before you got the money to buy more.: Never true    Within the past 12 months, the food you bought just didn't last and you didn't have money to get more.: Never true  Transportation Needs: No Transportation Needs (04/16/2023)   [facility]    In the past 12 months, has lack of transportation kept you from medical appointments or from getting medications?: No    Lack of Transportation (Non-Medical): No  Physical Activity: Not on file  Stress: No Stress Concern Present (10/10/2022)   [employer] [facility] [employer] - Occupational Stress Questionnaire    Feeling of Stress : Only a little  Social Connections: Not on file  Intimate Partner Violence: Not At Risk (12/31/2022)   Humiliation, Afraid, Rape, and Kick questionnaire     Fear of Current or Ex-Partner: No    Emotionally Abused: No    Physically Abused: No    Sexually Abused: No    Family History: Family History  Problem Relation Age of Onset   Breast cancer Mother 59       no GT   Breast cancer Maternal Aunt 33   Breast cancer Maternal Aunt    Lung cancer Maternal Uncle    Lung cancer Maternal Uncle    Bone cancer Paternal Aunt    Pancreatic cancer Maternal Grandmother 57   Lung cancer Maternal Grandfather 63   Breast cancer Cousin 40   Breast cancer Other 33       gene pos?    Review of Systems: Review of Systems  Constitutional: Negative.   Respiratory:  Negative for shortness of breath.   Cardiovascular:  Negative for chest pain, palpitations and leg swelling.  Gastrointestinal: Negative.   Genitourinary: Negative.   Neurological: Negative.     Physical Exam: Vital Signs BP 113/77 (BP Location: Left Arm, Patient Position: Sitting, Cuff Size: Normal)   Pulse 80   Ht 5' 2 (1.575 m)   Wt 150 lb 3.2 oz (68.1 kg)   SpO2 97%   BMI 27.47 kg/m   Physical Exam Constitutional:      General: Not in acute distress.    Appearance: Normal appearance. Not ill-appearing.  HENT:     Head: Normocephalic and atraumatic.  Eyes:     Pupils: Pupils are equal, round Neck:     Musculoskeletal: Normal range of motion.  Cardiovascular:     Rate and Rhythm: Normal rate    Pulses: Normal pulses.  Pulmonary:     Effort: Pulmonary effort is normal. No respiratory distress.  Abdominal:     General: Abdomen is flat. There is no distension.  Musculoskeletal: Normal range of motion.  Skin:    General: Skin is warm and dry.     Findings: No erythema or rash.  Port-A-Cath in place Neurological:     General: No focal deficit present.     Mental Status: Alert and oriented to person, place, and time. Mental status is at baseline.     Motor: No weakness.  Psychiatric:        Mood and Affect: Mood normal.  Behavior: Behavior normal.     Assessment/Plan: The patient is scheduled for removal of bilateral breast tissue expanders and placement of bilateral breast silicone implants with Dr. Lowery.  Risks, benefits, and alternatives of procedure discussed, questions answered and consent obtained.    Smoking Status: Non-smoker; Counseling Given?  N/A  Caprini Score: 10; Risk Factors include: Hx of PE while on Chemo, Hx of breast cancer,, BMI > 25, and length of planned surgery. Recommendation for mechanical and possible pharmacological prophylaxis. Encourage early ambulation. Patient was on eliquis , but recently stopped per guidance from oncology. Patient's PE was likely provoked by chemotherapy per oncology, will discuss postoperative Lovenox  for 1 week with Dr. Lowery.  Patient did have workup by cardiology, she reports she did not have her long-term monitor due to surgery being scheduled for her mastectomy.  But she has not had any ongoing palpitations.  She has not had any other cardiac or pulmonary symptoms.  She denies palpitations, shortness of breath, chest pain.  Pictures obtained: previous visit  Post-op Rx sent to pharmacy: Keflex   Patient was provided with the General Surgical Risk consent document and Pain Medication Agreement prior to their appointment.  They had adequate time to read through the risk consent documents and Pain Medication Agreement. We also discussed them in person together during this preop appointment. All of their questions were answered to their satisfaction.  Recommended calling if they have any further questions.  Risk consent form and Pain Medication Agreement to be scanned into patient's chart.  Patient was provided with the [employer] implant patient decision checklist and this was completed during today's preoperative evaluation. Patient had time to read through the information and any questions were answered to their content. Form will be scanned into patient's chart.  The risks that  can be encountered with and after placement of a breast implant were discussed and include the following but not limited to these: bleeding, infection, delayed healing, anesthesia risks, skin sensation changes, injury to structures including nerves, blood vessels, and muscles which may be temporary or permanent, allergies to tape, suture materials and glues, blood products, topical preparations or injected agents, skin contour irregularities, skin discoloration and swelling, deep vein thrombosis, cardiac and pulmonary complications, pain, which may persist, fluid accumulation, wrinkling of the skin over the implanmt, changes in nipple or breast sensation, implant leakage or rupture, faulty position of the implant, persistent pain, formation of tight scar tissue around the implant (capsular contracture).   Electronically signed by: Donnice PARAS Issac Moure, PA-C 08/26/2023 9:02 AM

## 2023-08-27 ENCOUNTER — Ambulatory Visit

## 2023-08-27 ENCOUNTER — Inpatient Hospital Stay: Admitting: Occupational Therapy

## 2023-08-27 DIAGNOSIS — R293 Abnormal posture: Secondary | ICD-10-CM

## 2023-08-27 DIAGNOSIS — M25612 Stiffness of left shoulder, not elsewhere classified: Secondary | ICD-10-CM

## 2023-08-27 DIAGNOSIS — Z5112 Encounter for antineoplastic immunotherapy: Secondary | ICD-10-CM | POA: Diagnosis not present

## 2023-08-27 DIAGNOSIS — M25611 Stiffness of right shoulder, not elsewhere classified: Secondary | ICD-10-CM

## 2023-08-27 NOTE — Therapy (Signed)
 OUTPATIENT OCCUPATIONAL THERAPY BREAST CANCER POSTOP TREATMENT   Patient Name: Penny Gomez MRN: [id number] DOB:30-Dec-1985, 38 y.o., female Today's Date: 08/27/2023  END OF SESSION:  OT End of Session - 08/27/23 1820     Visit Number 3    Number of Visits 10    Date for OT Re-Evaluation 11/19/23    OT Start Time 0855    OT Stop Time 0930    OT Time Calculation (min) 35 min    Activity Tolerance Patient tolerated treatment well    Behavior During Therapy WFL for tasks assessed/performed          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   Vascular port complication    Past Surgical History:  Procedure Laterality Date   BREAST BIOPSY Right 05/20/2019   Affirm Bx- X- clip, neg   BREAST BIOPSY Left 10/02/2022   US  Bx, path pending   BREAST BIOPSY Left 10/02/2022   Us  Bx Node- path pending   BREAST BIOPSY Left 10/02/2022   US  LT BREAST BX W LOC DEV 1ST LESION IMG BX SPEC US  GUIDE 10/02/2022 [facility]   BREAST RECONSTRUCTION Bilateral 07/24/2023   Procedure: RECONSTRUCTION, BREAST;  Surgeon: Lowery Estefana RAMAN, DO;  Location: [facility];  Service: Plastics;  Laterality: Bilateral;  BILATERAL IMMEDIATE BREAST RECONSTRUCTION WITH EXPANDER AND FLEX HD PLACEMENT   CESAREAN SECTION     x 2   LAPAROSCOPIC BILATERAL SALPINGECTOMY Bilateral 02/09/2022   Procedure: LAPAROSCOPIC BILATERAL SALPINGECTOMY;  Surgeon: Connell Davies, MD;  Location: [facility];  Service: Gynecology;  Laterality: Bilateral;   MASTECTOMY W/ SENTINEL NODE BIOPSY Bilateral 07/24/2023    Procedure: MASTECTOMY WITH SENTINEL LYMPH NODE BIOPSY;  Surgeon: Tye Millet, DO;  Location: [facility];  Service: General;  Laterality: Bilateral;  SN biopsy left only   PORTACATH PLACEMENT Right 10/17/2022   Procedure: INSERTION PORT-A-CATH;  Surgeon: Desiderio Schanz, MD;  Location: [facility];  Service: General;  Laterality: Right;   PORTACATH PLACEMENT Left 02/10/2023   Procedure: INSERTION PORT-A-CATH;  Surgeon: Tye Millet, DO;  Location: [facility];  Service: General;  Laterality: Left;   TEE WITHOUT CARDIOVERSION N/A 01/02/2023   Procedure: TRANSESOPHAGEAL ECHOCARDIOGRAM (TEE);  Surgeon: Perla Evalene PARAS, MD;  Location: [facility];  Service: Cardiovascular;  Laterality: N/A;   TUBAL LIGATION     Patient Active Problem List   Diagnosis Date Noted   Encounter for antineoplastic immunotherapy 08/22/2023   Hypocalcemia 08/22/2023   Breast cancer (HCC) 07/24/2023   Palpitation 04/25/2023   Bloodstream infection due to Port-A-Cath 01/02/2023   Endocarditis 01/02/2023   Thrombocytopenia (HCC) 01/01/2023   Anemia 01/01/2023   Bacteremia 12/31/2022   Sepsis (HCC) 12/31/2022   Acute febrile illness 12/30/2022   Hypokalemia 12/06/2022   History of pulmonary embolism 11/24/2022   Vascular port complication 11/24/2022   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) 11/24/2022   Multifocal pneumonia 11/24/2022   Pulmonary infarct (HCC) 11/24/2022   Genetic testing 11/11/2022  BRCA1 gene mutation positive 11/11/2022   Encounter for antineoplastic chemotherapy 10/25/2022   Port-A-Cath in place 10/25/2022   Invasive carcinoma of breast (HCC) 10/10/2022   Goals of care, counseling/discussion 10/10/2022   Family history of cancer 10/10/2022   IDA (iron  deficiency anemia) 10/10/2022   Urinary frequency 03/19/2022    PCP: Donnie PA  REFERRING PROVIDER: Dr Babara MART DIAG: [id number]  THERAPY DIAG:  Stiffness of left shoulder, not elsewhere classified  Stiffness of right shoulder, not  elsewhere classified  Abnormal posture  Rationale for Evaluation and Treatment: Rehabilitation  ONSET DATE: 09/23/22  SUBJECTIVE:                                                                                                                                                                                           SUBJECTIVE STATEMENT: Patient reports she ended up up having bilateral mastectomy with immediate reconstruction with expanders.  For her left breast cancer.  I did not want any more [place].  I am happy with the size.  It just feels so hard and so tight.  PERTINENT HISTORY:  Patient was diagnosed with left  breast cancer -has finished chemo -patient had on 07/24/2023 bilateral mastectomy with immediate reconstruction with expanders.  Patient is scheduled for removal of expanders and putting in silicon implants on the 09/11/2023.  Because I need to start radiation.  PATIENT GOALS:   reduce lymphedema risk and learn post op HEP.   PAIN:  Are you having pain? No  PRECAUTIONS: Active CA      HAND DOMINANCE: right  WEIGHT BEARING RESTRICTIONS: No  FALLS:  Has patient fallen in last 6 months? No  LIVING ENVIRONMENT: Patient lives with: family  OCCUPATION: Works at [employer] in [employer].  Just started yesterday there.  Has a 106 and a 37 year old likes to do things around the house.     OBJECTIVE:  COGNITION: Overall cognitive status: Within functional limits for tasks assessed    POSTURE:  rounded shoulders posture-guarding chest  UPPER EXTREMITY AROM/PROM:   Bill Shoulder active range of motion within normal limits at preop 08/27/23 : Shoulder flexion 150 degrees.  Shoulder abduction more  into scaption 150.  External rotation within functional limits at 90 degrees but decreased overhead   CERVICAL AROM: All within normal limits:      UPPER EXTREMITY STRENGTH: 5/5 for bilateral upper extremity at all joints preop-not tested  now.  LYMPHEDEMA ASSESSMENTS:   LANDMARK RIGHT   eval  15 cm proximal to olecranon process 27.5  Olecranon process 23  15 cm proximal to ulnar styloid process 22.3  Just proximal  to ulnar styloid process 13.8  Across hand at thumb web space   At base of 2nd digit   (Blank rows = not tested)  LANDMARK LEFT   eval  15 cm proximal to olecranon process 26.4  Olecranon process 22.5  15 cm proximal to ulnar styloid process 22  Just proximal to ulnar styloid process 13.5  Across hand at thumb web space   At base of 2nd digit   (Blank rows = not tested)  L-DEX LYMPHEDEMA SCREENING:  L- dex score 4.3 prior to chemo Risk limb L and R hand dominant   The patient was assessed using the L-Dex machine today to produce a lymphedema index baseline score. The patient will be reassessed on a regular basis (typically every 3 months) to obtain new L-Dex scores. If the score is > 6.5 points away from his/her baseline score indicating onset of subclinical lymphedema, it will be recommended to wear a compression garment for 4 weeks, 12 hours per day and then be reassessed. If the score continues to be > 6.5 points from baseline at reassessment, we will initiate lymphedema treatment. Assessing in this manner has a 95% rate of preventing clinically significant lymphedema.   L-DEX FLOWSHEETS - 08/27/23 1800       L-DEX LYMPHEDEMA SCREENING   Measurement Type Unilateral    L-DEX MEASUREMENT EXTREMITY Upper Extremity    POSITION  Standing    DOMINANT SIDE Right    At Risk Side Left    BASELINE SCORE (UNILATERAL) -5    L-DEX SCORE (UNILATERAL) -1.5    VALUE CHANGE (UNILAT) 3.5         Reassess again patient's active range of motion this date after chemo.  Appear to be within normal limits. Reviewed handout provided for signs and symptoms of lymphedema as well as prevention. Reviewed with patient also home exercises for active assisted range of motion for shoulder flexion abduction external  rotation starting below 90 degrees depending on surgeons precautions and instructions as well as when to proceed overhead. Reinforced with patient because having reconstruction to discuss with plastic surgeon precautions as well as progression of range of motion and activities after surgery.  08/27/23: Patient with decreased endrange shoulder flexion as well as abduction external rotation.  Limited in the left more than the right.  Limited in abduction.  Perform more scaption. Reviewed with patient active assistive range of motion in supine for external rotation.  Needed verbal cueing to relax with gravity as well as deep breathing. To do 3 times Day in supine 8 reps hold 5 to 10 seconds Wall slides added for active assisted range of motion for shoulder flexion and abduction 10 reps to 3 times a day.  Patient to continue with home exercises until exchange for silicone implants after which she changed home program to supine again active assisted range of motion to 90 degrees for flexion abduction external rotation.  And follow-up with me.  PATIENT EDUCATION:  Education details: Lymphedema risk reduction and post op shoulder/posture HEP Person educated: Patient Education method: Explanation, Demonstration, Handout Education comprehension: Patient verbalized understanding and returned demonstration    ASSESSMENT:  CLINICAL IMPRESSION: Pt's multidisciplinary medical team has met to assess and determine a recommended treatment plan. She had chemotherapy and had bilateral mastectomy with immediate expander placement on 07/24/2023.  Patient presented with decreased shoulder flexion and abduction external rotation endrange.  Patient is scheduled for removal of expanders and placement of silicone implants on 09/11/23 -patient home exercises was upgraded  and to continue until surgery.  After which she changed to supine again for active assisted range of motion shoulder flexion and abduction pain-free to 90.   As well as external rotation.  Patient is to have radiation.  Patient to follow-up with me 2 to 3 weeks post switch of expanders for implants.  Patient L-Dex score was repeated within normal range.  Especially for her to being young and working.  Also repeat patient's L-Dex score.  She will benefit from a post op OT reassessment to determine needs and from L-Dex screens every 3 months for 2 years to detect subclinical lymphedema.  Pt will benefit from skilled therapeutic intervention to improve on the following deficits: Decreased knowledge of precautions and lymphedema education, impaired UE functional use, pain, decreased ROM, postural dysfunction.   OT treatment/interventions: ADL/self-care home management, pt/family education, therapeutic exercise,manual therapy  REHAB POTENTIAL: Good  CLINICAL DECISION MAKING: Stable/uncomplicated  EVALUATION COMPLEXITY: Low   GOALS: Goals reviewed with patient? YES  LONG TERM GOALS: (STG=LTG)    Name Target Date Goal status  1 Pt will be able to verbalize understanding of pertinent lymphedema risk reduction practices relevant to her dx specifically related to skin care.  Baseline:  No knowledge 11/18/13 Continue    2 Pt will be able to return demo and/or verbalize understanding of the post op HEP related to regaining shoulder ROM. Baseline:  No knowledge 11/19/23 Continues         4 Pt will demo she has regained full shoulder ROM and function post operatively compared to baselines.  Baseline: See objective measurements taken today. 11/19/23 Continue    PLAN:  OT FREQUENCY/DURATION: 6 visits in 12 weeks  PLAN FOR NEXT SESSION: will reassess 3-4 weeks post op to determine needs.     Occupational Therapy Information for After Breast Cancer Surgery/Treatment:  Lymphedema is a swelling condition that you may be at risk for in your arm if you have lymph nodes removed from the armpit area.  After a sentinel node biopsy, the risk is approximately  5-9% and is higher after an axillary node dissection.  There is treatment available for this condition and it is not life-threatening.  Contact your physician or occupational therapist with concerns. You may begin the 4 shoulder/posture exercises (see additional sheet) when permitted by your physician (typically a week after surgery).  If you have drains, you may need to wait until those are removed before beginning range of motion exercises.  A general recommendation is to not lift your arms above shoulder height until drains are removed.  These exercises should be done to your tolerance and gently.  This is not a no pain/no gain type of recovery so listen to your body and stretch into the range of motion that you can tolerate, stopping if you have pain.  If you are having immediate reconstruction, ask your plastic surgeon about doing exercises as he or she may want you to wait. We encourage you to watch the ABC (After Breast Cancer)  video. You will learn information related to lymphedema risk, prevention and treatment and additional exercises to regain mobility following surgery.  While undergoing any medical procedure or treatment, try to avoid blood pressure being taken or needle sticks from occurring on the arm on the side of cancer.   This recommendation begins after surgery and continues for the rest of your life.  This may help reduce your risk of getting lymphedema (swelling in your arm). An excellent resource for those seeking  information on lymphedema is the National Lymphedema Network's web site. It can be accessed at www.lymphnet.org If you notice swelling in your hand, arm or breast at any time following surgery (even if it is many years from now), please contact your doctor or occupational therapist to discuss this.  Lymphedema can be treated at any time but it is easier for you if it is treated early on.  If you feel like your shoulder motion is not returning to normal in a reasonable amount  of time, please contact your surgeon or occupational therapist.  [facility] [phone]. [address], [place], [place] [address]      Ancel Peters, OTR/L,CLT 08/27/2023, 6:23 PM

## 2023-08-31 ENCOUNTER — Encounter: Payer: Self-pay | Admitting: Oncology

## 2023-09-01 ENCOUNTER — Telehealth: Payer: Self-pay | Admitting: Oncology

## 2023-09-01 ENCOUNTER — Other Ambulatory Visit: Payer: Self-pay

## 2023-09-01 ENCOUNTER — Telehealth: Payer: Self-pay | Admitting: *Deleted

## 2023-09-01 MED ORDER — STERILE WATER FOR INJECTION IJ SOLN
5.0000 mL | Freq: Four times a day (QID) | OROMUCOSAL | 1 refills | Status: DC | PRN
  Filled 2023-09-01: qty 480, 24d supply, fill #0

## 2023-09-01 NOTE — Progress Notes (Signed)
 Patient is a pleasant 38 year old female s/p bilateral mastectomy with immediate tissue expander reconstruction performed 07/24/2023 by Dr. Lowery who presents to clinic for postoperative follow-up.  She was seen most recently here in clinic on 08/26/2023 for her preoperative exam for upcoming implant exchange surgery.  Current volume is 300/535 cc in each expander and per notes from earlier this month she does not want to be any larger.  Patient tells me that she was doing perfectly fine, but approximately 48 hours ago developed some new onset swelling involving the right breast.  She denies any associated pain, fever, tenderness, drainage, or other symptoms.  She is eager to proceed with the implant change.  Patient also states that she recently developed intraoral ulcerations which she messaged her oncologist about thinking that it is related to her Keytruda  immunotherapy treatments.  On examination, right reconstructed breast is significantly larger than the contralateral side with ballotable fluid collection.  No erythema or skin induration.  Incision appears well-healed.  100 cc cloudy serous aspirate obtained.  Will send culture and start on Bactrim .  Patient understands that we may have to hold off on placing the implant on the right side in the context of infection as it would likely compromise the implant and require revision.  Instead, may simply wash out the area and do the implant exchange on the contralateral side.  Dr. Lowery personally evaluated patient and states that may irrigate the pocket and place expander rather than implant and then proceed with implant exchange after completion of radiation treatment.  Patient voices understanding and is agreeable to the plan.  She will begin taking her antibiotics and call the office should she develop any recurrence and significant swelling or other new symptoms.

## 2023-09-01 NOTE — H&P (View-Only) (Signed)
 Patient is a pleasant 38 year old female s/p bilateral mastectomy with immediate tissue expander reconstruction performed 07/24/2023 by Dr. Lowery who presents to clinic for postoperative follow-up.  She was seen most recently here in clinic on 08/26/2023 for her preoperative exam for upcoming implant exchange surgery.  Current volume is 300/535 cc in each expander and per notes from earlier this month she does not want to be any larger.  Patient tells me that she was doing perfectly fine, but approximately 48 hours ago developed some new onset swelling involving the right breast.  She denies any associated pain, fever, tenderness, drainage, or other symptoms.  She is eager to proceed with the implant change.  Patient also states that she recently developed intraoral ulcerations which she messaged her oncologist about thinking that it is related to her Keytruda  immunotherapy treatments.  On examination, right reconstructed breast is significantly larger than the contralateral side with ballotable fluid collection.  No erythema or skin induration.  Incision appears well-healed.  100 cc cloudy serous aspirate obtained.  Will send culture and start on Bactrim .  Patient understands that we may have to hold off on placing the implant on the right side in the context of infection as it would likely compromise the implant and require revision.  Instead, may simply wash out the area and do the implant exchange on the contralateral side.  Dr. Lowery personally evaluated patient and states that may irrigate the pocket and place expander rather than implant and then proceed with implant exchange after completion of radiation treatment.  Patient voices understanding and is agreeable to the plan.  She will begin taking her antibiotics and call the office should she develop any recurrence and significant swelling or other new symptoms.

## 2023-09-01 NOTE — Telephone Encounter (Signed)
 I called the patient she said that she did on MyChart and they already gave her something for her mouth

## 2023-09-01 NOTE — Telephone Encounter (Signed)
 Patient states she has mouth sores and has had them for about 2 weeks and they wont go away. They hurt when she brushes her teeth. She sent a message on mychart but hasn't heard anything back. Please call at the number listed

## 2023-09-02 ENCOUNTER — Ambulatory Visit: Admitting: Physician Assistant

## 2023-09-02 ENCOUNTER — Encounter: Payer: Self-pay | Admitting: Physician Assistant

## 2023-09-02 VITALS — BP 112/76 | HR 77 | Ht 62.0 in | Wt 150.0 lb

## 2023-09-02 DIAGNOSIS — Z5112 Encounter for antineoplastic immunotherapy: Secondary | ICD-10-CM | POA: Diagnosis not present

## 2023-09-02 DIAGNOSIS — Z9889 Other specified postprocedural states: Secondary | ICD-10-CM

## 2023-09-02 DIAGNOSIS — N6489 Other specified disorders of breast: Secondary | ICD-10-CM

## 2023-09-02 MED ORDER — SULFAMETHOXAZOLE-TRIMETHOPRIM 800-160 MG PO TABS: 1.0000 | ORAL_TABLET | Freq: Two times a day (BID) | ORAL | 0 refills | Status: AC

## 2023-09-03 ENCOUNTER — Ambulatory Visit

## 2023-09-03 ENCOUNTER — Telehealth: Payer: Self-pay | Admitting: Physician Assistant

## 2023-09-03 ENCOUNTER — Ambulatory Visit: Admitting: Surgical

## 2023-09-03 ENCOUNTER — Encounter: Payer: Self-pay | Admitting: Surgical

## 2023-09-03 VITALS — BP 111/70 | HR 79

## 2023-09-03 DIAGNOSIS — C50919 Malignant neoplasm of unspecified site of unspecified female breast: Secondary | ICD-10-CM

## 2023-09-03 DIAGNOSIS — I2699 Other pulmonary embolism without acute cor pulmonale: Secondary | ICD-10-CM

## 2023-09-03 DIAGNOSIS — Z9889 Other specified postprocedural states: Secondary | ICD-10-CM

## 2023-09-03 NOTE — Progress Notes (Signed)
 Patient is a 38 year old female here for evaluation of right breast swelling.  She had 100 cc of cloudy serous fluid aspirated from her right breast yesterday here in the office, today she presents for increased swelling. she has started Bactrim  and is not having any issues with that.  She is not having any infectious symptoms today, denies any fevers, tenderness, flulike symptoms.  Of note, she does have a history of MSSA bacteremia and sepsis from her previous right breast Port-A-Cath.  She does have a left breast Port-A-Cath in place right now.  She did receive initial Keytruda  dose on 08/22/2023.  She reported intraoral ulcerations to provider yesterday here in our office.  Chaperone present on exam On exam right breast subcutaneous fluid collection is noted with palpation, she does have a small area of erythema on the central portion of her right breast just above the mastectomy incision.  She does also have some inflammation of the right breast noted on exam.  No crepitus noted.  No obvious cellulitic changes noted.  A/P:  About 42 cc of cloudy milky white fluid was aspirated from the right breast using a sterile technique, care was taken to aspirate over the right breast expander port to decrease risk of rupture. Patient tolerated this well.  Culture not sent as patient had culture sent yesterday.  Encouraged patient to wear a soft compressive garment to help decrease ongoing swelling.  Encourage patient to continue to take Bactrim  antibiotic.  I would like her to follow-up early next week for reevaluation Monday or Tuesday.  She is planning to come Tuesday.  Pictures were obtained of the patient and placed in the chart with the patient's or guardian's permission.  She is encouraged to call with any concerns or changes over the weekend or the rest of the week, we discussed that we are on call 24/7 for urgent matters.

## 2023-09-03 NOTE — Telephone Encounter (Signed)
 Patient says that its still filling in the Expander, she's says that it has gotten bigger again, please reach out and advise

## 2023-09-03 NOTE — Telephone Encounter (Signed)
Pt coming at 2pm 

## 2023-09-03 NOTE — H&P (View-Only) (Signed)
 Patient is a 38 year old female here for evaluation of right breast swelling.  She had 100 cc of cloudy serous fluid aspirated from her right breast yesterday here in the office, today she presents for increased swelling. she has started Bactrim  and is not having any issues with that.  She is not having any infectious symptoms today, denies any fevers, tenderness, flulike symptoms.  Of note, she does have a history of MSSA bacteremia and sepsis from her previous right breast Port-A-Cath.  She does have a left breast Port-A-Cath in place right now.  She did receive initial Keytruda  dose on 08/22/2023.  She reported intraoral ulcerations to provider yesterday here in our office.  Chaperone present on exam On exam right breast subcutaneous fluid collection is noted with palpation, she does have a small area of erythema on the central portion of her right breast just above the mastectomy incision.  She does also have some inflammation of the right breast noted on exam.  No crepitus noted.  No obvious cellulitic changes noted.  A/P:  About 42 cc of cloudy milky white fluid was aspirated from the right breast using a sterile technique, care was taken to aspirate over the right breast expander port to decrease risk of rupture. Patient tolerated this well.  Culture not sent as patient had culture sent yesterday.  Encouraged patient to wear a soft compressive garment to help decrease ongoing swelling.  Encourage patient to continue to take Bactrim  antibiotic.  I would like her to follow-up early next week for reevaluation Monday or Tuesday.  She is planning to come Tuesday.  Pictures were obtained of the patient and placed in the chart with the patient's or guardian's permission.  She is encouraged to call with any concerns or changes over the weekend or the rest of the week, we discussed that we are on call 24/7 for urgent matters.

## 2023-09-04 ENCOUNTER — Other Ambulatory Visit: Payer: Self-pay

## 2023-09-04 ENCOUNTER — Ambulatory Visit

## 2023-09-04 ENCOUNTER — Encounter (HOSPITAL_BASED_OUTPATIENT_CLINIC_OR_DEPARTMENT_OTHER): Payer: Self-pay | Admitting: Plastic Surgery

## 2023-09-05 ENCOUNTER — Ambulatory Visit

## 2023-09-07 LAB — AEROBIC/ANAEROBIC CULTURE W GRAM STAIN (SURGICAL/DEEP WOUND)
Culture: NO GROWTH
Gram Stain: NONE SEEN

## 2023-09-08 ENCOUNTER — Ambulatory Visit

## 2023-09-09 ENCOUNTER — Ambulatory Visit (INDEPENDENT_AMBULATORY_CARE_PROVIDER_SITE_OTHER): Admitting: Surgical

## 2023-09-09 ENCOUNTER — Ambulatory Visit

## 2023-09-09 ENCOUNTER — Other Ambulatory Visit (HOSPITAL_COMMUNITY)
Admission: RE | Admit: 2023-09-09 | Discharge: 2023-09-09 | Disposition: A | Discharge status: Home / Self Care | Attending: Surgical | Admitting: Surgical

## 2023-09-09 ENCOUNTER — Encounter: Payer: Self-pay | Admitting: Surgical

## 2023-09-09 VITALS — BP 123/83 | HR 82 | Ht 62.0 in | Wt 147.0 lb

## 2023-09-09 DIAGNOSIS — Z9889 Other specified postprocedural states: Secondary | ICD-10-CM

## 2023-09-09 DIAGNOSIS — N6489 Other specified disorders of breast: Secondary | ICD-10-CM | POA: Diagnosis present

## 2023-09-09 DIAGNOSIS — C50919 Malignant neoplasm of unspecified site of unspecified female breast: Secondary | ICD-10-CM | POA: Insufficient documentation

## 2023-09-09 NOTE — H&P (View-Only) (Signed)
 Patient is a 38 year old female here for follow-up on her bilateral breast reconstruction.  She was scheduled for exchange of bilateral breast tissue expanders for bilateral breast permanent implants, however started noticing swelling of her right breast about 1 week ago.  She had fluid aspirated from her right breast which appeared cloudy, this was sent for culture.  She was then seen the following day on 09/03/2023, had cloudy milky white fluid aspirated from the right breast.  She did have culture sent on 09/02/2023 of aspirate, no WBCs seen, no organisms seen.  No growth  She reports she has been feeling well, no infectious symptoms or any symptomatic changes.  She is prepared for surgery on 09/11/2023 for removal of expanders, possible placement of implants, possible replacement of expander, possible explantation of right expander depending on surgical findings.  Chaperone present on exam On exam left breast and right breast incisions are intact and well-healed.  She does have some skin peeling of the central portion of the right mastectomy site just above the mastectomy incision.  There is no obvious cellulitic changes or skin thickening noted.  She does have a subcutaneous fluid collection noted palpation of the right breast.  Right Port-A-Cath scar is noted, left Port-A-Cath is in place.  A/P:  About 40 cc of white cloudy milky appearing fluid was aspirated from the right breast, concerning for purulence.  Sterile technique was used.  Discussed with patient that previous culture results were negative, however drainage appears to be clinically concerning for infection, will resend culture.  Patient was agreeable with this plan.  Recommend continuing with Bactrim  antibiotics, we did discuss that cultures could be skewed due to her being on antibiotics at this time, however given the expander in place, clinical decision was made to send culture to ensure there is no resistance if bacteria  found.  Patient without any infectious symptoms, recommend continue to monitor for symptomatic changes or any changes to her right breast.  Patient is prepared for surgery in 2 days.  Recommend calling with questions or concerns.

## 2023-09-09 NOTE — Progress Notes (Signed)
 Patient is a 38 year old female here for follow-up on her bilateral breast reconstruction.  She was scheduled for exchange of bilateral breast tissue expanders for bilateral breast permanent implants, however started noticing swelling of her right breast about 1 week ago.  She had fluid aspirated from her right breast which appeared cloudy, this was sent for culture.  She was then seen the following day on 09/03/2023, had cloudy milky white fluid aspirated from the right breast.  She did have culture sent on 09/02/2023 of aspirate, no WBCs seen, no organisms seen.  No growth  She reports she has been feeling well, no infectious symptoms or any symptomatic changes.  She is prepared for surgery on 09/11/2023 for removal of expanders, possible placement of implants, possible replacement of expander, possible explantation of right expander depending on surgical findings.  Chaperone present on exam On exam left breast and right breast incisions are intact and well-healed.  She does have some skin peeling of the central portion of the right mastectomy site just above the mastectomy incision.  There is no obvious cellulitic changes or skin thickening noted.  She does have a subcutaneous fluid collection noted palpation of the right breast.  Right Port-A-Cath scar is noted, left Port-A-Cath is in place.  A/P:  About 40 cc of white cloudy milky appearing fluid was aspirated from the right breast, concerning for purulence.  Sterile technique was used.  Discussed with patient that previous culture results were negative, however drainage appears to be clinically concerning for infection, will resend culture.  Patient was agreeable with this plan.  Recommend continuing with Bactrim  antibiotics, we did discuss that cultures could be skewed due to her being on antibiotics at this time, however given the expander in place, clinical decision was made to send culture to ensure there is no resistance if bacteria  found.  Patient without any infectious symptoms, recommend continue to monitor for symptomatic changes or any changes to her right breast.  Patient is prepared for surgery in 2 days.  Recommend calling with questions or concerns.

## 2023-09-10 ENCOUNTER — Ambulatory Visit

## 2023-09-10 NOTE — Anesthesia Preprocedure Evaluation (Signed)
 Anesthesia Evaluation  Patient identified by MRN, date of birth, ID band Patient awake    Reviewed: Allergy & Precautions, NPO status , Patient's Chart, lab work & pertinent test results  History of Anesthesia Complications Negative for: history of anesthetic complications  Airway Mallampati: II  TM Distance: >3 FB Neck ROM: Full    Dental no notable dental hx. 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 )   Pulmonary exam normal breath sounds clear to auscultation       Cardiovascular (-) hypertension(-) angina (-) Past MI Normal cardiovascular exam Rhythm:Regular Rate:Normal     Neuro/Psych  negative psych ROS   GI/Hepatic   Endo/Other    Renal/GU Lab Results      Component                Value               Date                      NA                       137                 08/22/2023                CL                       107                 08/22/2023                K                        3.9                 08/22/2023                CO2                      24                  08/22/2023                BUN                      8                   08/22/2023                CREATININE               0.72                08/22/2023                GFRNONAA                 >60                 08/22/2023                CALCIUM                   8.6 (L)             08/22/2023  ALBUMIN                  3.3 (L)             08/22/2023                GLUCOSE                  98                  08/22/2023                Musculoskeletal   Abdominal   Peds  Hematology  (+) Blood dyscrasia (thrombocytopenia), anemia Lab Results      Component                Value               Date                      WBC                      4.0                 08/22/2023                HGB                      12.6                08/22/2023                HCT                      38.7                 08/22/2023                MCV                      90.0                08/22/2023                PLT                      205                 08/22/2023           On eliquis    Anesthesia Other Findings Breast ca hx  Reproductive/Obstetrics negative OB ROS                              Anesthesia Physical Anesthesia Plan  ASA: 3  Anesthesia Plan: General   Post-op Pain Management: Precedex  and Tylenol  PO (pre-op)*   Induction:   PONV Risk Score and Plan: Propofol  infusion, TIVA, Treatment may vary due to age or medical condition and Ondansetron   Airway Management Planned: Oral ETT and LMA  Additional Equipment: None  Intra-op Plan:   Post-operative Plan: Extubation in OR  Informed Consent: I have reviewed the patients History and Physical, chart, labs and discussed the procedure including the risks, benefits and alternatives for the proposed anesthesia with the patient or authorized representative who has indicated his/her understanding and acceptance.     Dental advisory  given  Plan Discussed with: CRNA and Surgeon  Anesthesia Plan Comments:          Anesthesia Quick Evaluation

## 2023-09-11 ENCOUNTER — Other Ambulatory Visit: Payer: Self-pay

## 2023-09-11 ENCOUNTER — Encounter (HOSPITAL_BASED_OUTPATIENT_CLINIC_OR_DEPARTMENT_OTHER): Payer: Self-pay | Admitting: Plastic Surgery

## 2023-09-11 ENCOUNTER — Ambulatory Visit (HOSPITAL_BASED_OUTPATIENT_CLINIC_OR_DEPARTMENT_OTHER): Payer: Self-pay | Admitting: Anesthesiology

## 2023-09-11 ENCOUNTER — Encounter (HOSPITAL_BASED_OUTPATIENT_CLINIC_OR_DEPARTMENT_OTHER)
Admission: RE | Disposition: A | Discharge status: Home / Self Care | Payer: Self-pay | Source: Home / Self Care | Attending: Plastic Surgery

## 2023-09-11 ENCOUNTER — Telehealth: Payer: Self-pay | Admitting: Surgical

## 2023-09-11 ENCOUNTER — Ambulatory Visit (HOSPITAL_BASED_OUTPATIENT_CLINIC_OR_DEPARTMENT_OTHER)
Admission: RE | Admit: 2023-09-11 | Discharge: 2023-09-11 | Disposition: A | Discharge status: Home / Self Care | Attending: Plastic Surgery | Admitting: Plastic Surgery

## 2023-09-11 ENCOUNTER — Ambulatory Visit

## 2023-09-11 DIAGNOSIS — Z86711 Personal history of pulmonary embolism: Secondary | ICD-10-CM | POA: Insufficient documentation

## 2023-09-11 DIAGNOSIS — Z421 Encounter for breast reconstruction following mastectomy: Secondary | ICD-10-CM

## 2023-09-11 DIAGNOSIS — Z853 Personal history of malignant neoplasm of breast: Secondary | ICD-10-CM

## 2023-09-11 DIAGNOSIS — Z803 Family history of malignant neoplasm of breast: Secondary | ICD-10-CM | POA: Insufficient documentation

## 2023-09-11 DIAGNOSIS — Z1501 Genetic susceptibility to malignant neoplasm of breast: Secondary | ICD-10-CM | POA: Insufficient documentation

## 2023-09-11 DIAGNOSIS — Z9013 Acquired absence of bilateral breasts and nipples: Secondary | ICD-10-CM | POA: Diagnosis not present

## 2023-09-11 DIAGNOSIS — Z01818 Encounter for other preprocedural examination: Secondary | ICD-10-CM

## 2023-09-11 DIAGNOSIS — C50912 Malignant neoplasm of unspecified site of left female breast: Secondary | ICD-10-CM | POA: Insufficient documentation

## 2023-09-11 DIAGNOSIS — Z45811 Encounter for adjustment or removal of right breast implant: Secondary | ICD-10-CM | POA: Insufficient documentation

## 2023-09-11 HISTORY — PX: REMOVAL OF BILATERAL TISSUE EXPANDERS WITH PLACEMENT OF BILATERAL BREAST IMPLANTS: SHX6431

## 2023-09-11 LAB — POCT PREGNANCY, URINE: Preg Test, Ur: NEGATIVE

## 2023-09-11 SURGERY — REMOVAL, TISSUE EXPANDER, BREAST, BILATERAL, WITH BILATERAL IMPLANT IMPLANT INSERTION
Anesthesia: General | Site: Breast | Laterality: Bilateral

## 2023-09-11 MED ORDER — DEXAMETHASONE SODIUM PHOSPHATE 10 MG/ML IJ SOLN
INTRAMUSCULAR | Status: AC
Start: 2023-09-11 — End: 2023-09-11
  Filled 2023-09-11: qty 1

## 2023-09-11 MED ORDER — SODIUM CHLORIDE 0.9% FLUSH
3.0000 mL | INTRAVENOUS | Status: DC | PRN
Start: 2023-09-11 — End: 2023-09-11

## 2023-09-11 MED ORDER — LIDOCAINE 2% (20 MG/ML) 5 ML SYRINGE
INTRAMUSCULAR | Status: AC
Start: 2023-09-11 — End: 2023-09-11
  Filled 2023-09-11: qty 5

## 2023-09-11 MED ORDER — SODIUM CHLORIDE 0.9 % IV SOLN
INTRAVENOUS | Status: AC
  Filled 2023-09-11: qty 10

## 2023-09-11 MED ORDER — FENTANYL CITRATE (PF) 100 MCG/2ML IJ SOLN
INTRAMUSCULAR | Status: DC | PRN
  Administered 2023-09-11 (×2): 25 ug via INTRAVENOUS
  Administered 2023-09-11: 50 ug via INTRAVENOUS

## 2023-09-11 MED ORDER — ONDANSETRON HCL 4 MG/2ML IJ SOLN
INTRAMUSCULAR | Status: DC | PRN
  Administered 2023-09-11: 4 mg via INTRAVENOUS

## 2023-09-11 MED ORDER — MIDAZOLAM HCL 5 MG/5ML IJ SOLN
INTRAMUSCULAR | Status: DC | PRN
Start: 2023-09-11 — End: 2023-09-11
  Administered 2023-09-11: 2 mg via INTRAVENOUS

## 2023-09-11 MED ORDER — LIDOCAINE-EPINEPHRINE 1 %-1:100000 IJ SOLN
INTRAMUSCULAR | Status: DC | PRN
  Administered 2023-09-11: 5 mL

## 2023-09-11 MED ORDER — PROPOFOL 10 MG/ML IV BOLUS
INTRAVENOUS | Status: DC | PRN
  Administered 2023-09-11: 120 mg via INTRAVENOUS

## 2023-09-11 MED ORDER — CHLORHEXIDINE GLUCONATE CLOTH 2 % EX PADS: 6.0000 | MEDICATED_PAD | Freq: Once | CUTANEOUS | Status: DC

## 2023-09-11 MED ORDER — DOXYCYCLINE HYCLATE 100 MG PO TABS: 100.0000 mg | ORAL_TABLET | Freq: Two times a day (BID) | ORAL | 0 refills | Status: AC

## 2023-09-11 MED ORDER — OXYCODONE HCL 5 MG PO TABS: 5.0000 mg | ORAL_TABLET | ORAL | Status: DC | PRN

## 2023-09-11 MED ORDER — SODIUM CHLORIDE 0.9 % IV SOLN
250.0000 mL | INTRAVENOUS | Status: DC | PRN
Start: 2023-09-11 — End: 2023-09-11

## 2023-09-11 MED ORDER — SODIUM CHLORIDE 0.9 % IV SOLN: INTRAVENOUS | Status: DC | PRN

## 2023-09-11 MED ORDER — PROPOFOL 10 MG/ML IV BOLUS
INTRAVENOUS | Status: AC
  Filled 2023-09-11: qty 20

## 2023-09-11 MED ORDER — SODIUM CHLORIDE (PF) 0.9 % IJ SOLN
INTRAMUSCULAR | Status: DC | PRN
  Administered 2023-09-11: 1000 mL via INTRAVENOUS

## 2023-09-11 MED ORDER — DEXAMETHASONE SODIUM PHOSPHATE 10 MG/ML IJ SOLN
INTRAMUSCULAR | Status: DC | PRN
Start: 2023-09-11 — End: 2023-09-11
  Administered 2023-09-11: 10 mg via INTRAVENOUS

## 2023-09-11 MED ORDER — ACETAMINOPHEN 325 MG PO TABS: 650.0000 mg | ORAL_TABLET | ORAL | Status: DC | PRN

## 2023-09-11 MED ORDER — PROPOFOL 500 MG/50ML IV EMUL
INTRAVENOUS | Status: DC | PRN
  Administered 2023-09-11: 150 ug/kg/min via INTRAVENOUS

## 2023-09-11 MED ORDER — FENTANYL CITRATE (PF) 100 MCG/2ML IJ SOLN: 25.0000 ug | INTRAMUSCULAR | Status: DC | PRN

## 2023-09-11 MED ORDER — FENTANYL CITRATE (PF) 100 MCG/2ML IJ SOLN
INTRAMUSCULAR | Status: AC
  Filled 2023-09-11: qty 2

## 2023-09-11 MED ORDER — LACTATED RINGERS IV SOLN: INTRAVENOUS | Status: DC

## 2023-09-11 MED ORDER — LIDOCAINE 2% (20 MG/ML) 5 ML SYRINGE
INTRAMUSCULAR | Status: DC | PRN
Start: 2023-09-11 — End: 2023-09-11
  Administered 2023-09-11: 100 mg via INTRAVENOUS

## 2023-09-11 MED ORDER — MIDAZOLAM HCL 2 MG/2ML IJ SOLN
INTRAMUSCULAR | Status: AC
Start: 2023-09-11 — End: 2023-09-11
  Filled 2023-09-11: qty 2

## 2023-09-11 MED ORDER — DEXMEDETOMIDINE HCL IN NACL 80 MCG/20ML IV SOLN
INTRAVENOUS | Status: DC | PRN
  Administered 2023-09-11: 8 ug via INTRAVENOUS

## 2023-09-11 MED ORDER — SODIUM CHLORIDE 0.9% FLUSH: 3.0000 mL | Freq: Two times a day (BID) | INTRAVENOUS | Status: DC

## 2023-09-11 MED ORDER — ACETAMINOPHEN 325 MG RE SUPP
650.0000 mg | RECTAL | Status: DC | PRN
Start: 2023-09-11 — End: 2023-09-11

## 2023-09-11 MED ORDER — ONDANSETRON HCL 4 MG/2ML IJ SOLN
INTRAMUSCULAR | Status: AC
  Filled 2023-09-11: qty 2

## 2023-09-11 MED ORDER — VASHE WOUND IRRIGATION OPTIME
TOPICAL | Status: DC | PRN
  Administered 2023-09-11: 34 [oz_av]

## 2023-09-11 MED ORDER — CEFAZOLIN SODIUM-DEXTROSE 2-4 GM/100ML-% IV SOLN
2.0000 g | INTRAVENOUS | Status: AC
  Administered 2023-09-11: 2 g via INTRAVENOUS

## 2023-09-11 MED ORDER — CEFAZOLIN SODIUM-DEXTROSE 2-4 GM/100ML-% IV SOLN
INTRAVENOUS | Status: AC
  Filled 2023-09-11: qty 100

## 2023-09-11 SURGICAL SUPPLY — 55 items
BAG DECANTER FOR FLEXI CONT (MISCELLANEOUS) ×1 IMPLANT
BINDER BREAST LRG (GAUZE/BANDAGES/DRESSINGS) IMPLANT
BINDER BREAST MEDIUM (GAUZE/BANDAGES/DRESSINGS) IMPLANT
BINDER BREAST XLRG (GAUZE/BANDAGES/DRESSINGS) IMPLANT
BINDER BREAST XXLRG (GAUZE/BANDAGES/DRESSINGS) IMPLANT
BIOPATCH RED 1 DISK 7.0 (GAUZE/BANDAGES/DRESSINGS) IMPLANT
BLADE HEX COATED 2.75 (ELECTRODE) IMPLANT
BLADE SURG 15 STRL LF DISP TIS (BLADE) ×2 IMPLANT
CANISTER SUCT 1200ML W/VALVE (MISCELLANEOUS) ×1 IMPLANT
COVER BACK TABLE 60X90IN (DRAPES) ×1 IMPLANT
COVER MAYO STAND STRL (DRAPES) ×1 IMPLANT
DERMABOND ADVANCED .7 DNX12 (GAUZE/BANDAGES/DRESSINGS) ×2 IMPLANT
DRAIN CHANNEL 19F RND (DRAIN) IMPLANT
DRAPE LAPAROSCOPIC ABDOMINAL (DRAPES) ×1 IMPLANT
DRESSING MEPILEX FLEX 4X4 (GAUZE/BANDAGES/DRESSINGS) IMPLANT
DRSG OPSITE POSTOP 4X6 (GAUZE/BANDAGES/DRESSINGS) ×2 IMPLANT
DRSG TEGADERM 4X4.75 (GAUZE/BANDAGES/DRESSINGS) IMPLANT
ELECTRODE BLDE 4.0 EZ CLN MEGD (MISCELLANEOUS) ×1 IMPLANT
ELECTRODE REM PT RTRN 9FT ADLT (ELECTROSURGICAL) ×1 IMPLANT
EVACUATOR SILICONE 100CC (DRAIN) IMPLANT
FUNNEL KELLER 2 DISP (MISCELLANEOUS) IMPLANT
GAUZE PAD ABD 8X10 STRL (GAUZE/BANDAGES/DRESSINGS) ×2 IMPLANT
GAUZE SPONGE 4X4 12PLY STRL LF (GAUZE/BANDAGES/DRESSINGS) IMPLANT
GLOVE BIO SURGEON STRL SZ 6.5 (GLOVE) ×3 IMPLANT
GLOVE BIO SURGEON STRL SZ7 (GLOVE) IMPLANT
GLOVE BIOGEL PI IND STRL 7.0 (GLOVE) IMPLANT
GOWN STRL REUS W/ TWL LRG LVL3 (GOWN DISPOSABLE) ×2 IMPLANT
IMPL EXPANDER BREAST 455CC (Breast) IMPLANT
IV NS 1000ML BAXH (IV SOLUTION) IMPLANT
KIT FILL ASEPTIC TRANSFER (MISCELLANEOUS) IMPLANT
NDL HYPO 25X1 1.5 SAFETY (NEEDLE) ×1 IMPLANT
NDL SAFETY ECLIPSE 18X1.5 (NEEDLE) ×1 IMPLANT
NEEDLE HYPO 25X1 1.5 SAFETY (NEEDLE) ×1 IMPLANT
PACK BASIN DAY SURGERY FS (CUSTOM PROCEDURE TRAY) ×1 IMPLANT
PENCIL SMOKE EVACUATOR (MISCELLANEOUS) ×1 IMPLANT
PIN SAFETY STERILE (MISCELLANEOUS) IMPLANT
SLEEVE SCD COMPRESS KNEE MED (STOCKING) ×1 IMPLANT
SOL PREP POV-IOD 4OZ 10% (MISCELLANEOUS) IMPLANT
SPIKE FLUID TRANSFER (MISCELLANEOUS) IMPLANT
SPONGE T-LAP 18X18 ~~LOC~~+RFID (SPONGE) ×2 IMPLANT
STRIP SUTURE WOUND CLOSURE 1/2 (MISCELLANEOUS) ×2 IMPLANT
SUT MNCRL AB 4-0 PS2 18 (SUTURE) ×2 IMPLANT
SUT MON AB 3-0 SH27 (SUTURE) ×2 IMPLANT
SUT PDS AB 2-0 CT2 27 (SUTURE) ×2 IMPLANT
SUT PDS II 3-0 CT2 27 ABS (SUTURE) ×2 IMPLANT
SUT SILK 3 0 PS 1 (SUTURE) IMPLANT
SWAB COLLECTION DEVICE MRSA (MISCELLANEOUS) IMPLANT
SWAB CULTURE ESWAB REG 1ML (MISCELLANEOUS) IMPLANT
SYR BULB IRRIG 60ML STRL (SYRINGE) ×1 IMPLANT
SYR CONTROL 10ML LL (SYRINGE) ×1 IMPLANT
TOWEL GREEN STERILE FF (TOWEL DISPOSABLE) ×2 IMPLANT
TRAY DSU PREP LF (CUSTOM PROCEDURE TRAY) ×1 IMPLANT
TUBE CONNECTING 20X1/4 (TUBING) ×1 IMPLANT
UNDERPAD 30X36 HEAVY ABSORB (UNDERPADS AND DIAPERS) ×2 IMPLANT
YANKAUER SUCT BULB TIP NO VENT (SUCTIONS) ×1 IMPLANT

## 2023-09-11 NOTE — Interval H&P Note (Signed)
 History and Physical Interval Note:  09/11/2023 10:08 AM  Penny Gomez  has presented today for surgery, with the diagnosis of history of breast cancer.  The various methods of treatment have been discussed with the patient and family. After consideration of risks, benefits and other options for treatment, the patient has consented to  Procedure(s): REMOVAL, TISSUE EXPANDER, BREAST, BILATERAL, WITH BILATERAL IMPLANT IMPLANT INSERTION (Bilateral) as a surgical intervention.  The patient's history has been reviewed, patient examined, no change in status, stable for surgery.  I have reviewed the patient's chart and labs.  Questions were answered to the patient's satisfaction.     Estefana RAMAN Adam Demary

## 2023-09-11 NOTE — Interval H&P Note (Signed)
 History and Physical Interval Note:  09/11/2023 10:09 AM  Penny Gomez  has presented today for surgery, with the diagnosis of history of breast cancer.  The various methods of treatment have been discussed with the patient and family. After consideration of risks, benefits and other options for treatment, the patient has consented to  Procedure(s): REMOVAL, TISSUE EXPANDER, BREAST, BILATERAL, WITH BILATERAL IMPLANT IMPLANT INSERTION (Bilateral) as a surgical intervention.  The patient's history has been reviewed, patient examined, no change in status, stable for surgery.  I have reviewed the patient's chart and labs.  Questions were answered to the patient's satisfaction.     Estefana RAMAN Lj Miyamoto

## 2023-09-11 NOTE — Transfer of Care (Signed)
 Immediate Anesthesia Transfer of Care Note  Patient: Penny Gomez  Procedure(s) Performed: REMOVAL, TISSUE EXPANDER, BREAST, BILATERAL, WITH BILATERAL IMPLANT IMPLANT INSERTION (Bilateral: Breast)  Patient Location: PACU  Anesthesia Type:General  Level of Consciousness: drowsy, patient cooperative, and responds to stimulation  Airway & Oxygen Therapy: Patient Spontanous Breathing and Patient connected to face mask oxygen  Post-op Assessment: Report given to RN and Post -op Vital signs reviewed and stable  Post vital signs: Reviewed and stable  Last Vitals:  Vitals Value Taken Time  BP    Temp    Pulse 85 09/11/23 11:23  Resp 17 09/11/23 11:23  SpO2 100 % 09/11/23 11:23  Vitals shown include unfiled device data.  Last Pain:  Vitals:   09/11/23 1003  TempSrc: Temporal  PainSc: 0-No pain         Complications: No notable events documented.

## 2023-09-11 NOTE — Op Note (Addendum)
 Op report   DATE OF OPERATION:  09/11/2023  LOCATION: [facility]  SURGICAL DIVISION: Plastic Surgery  PREOPERATIVE DIAGNOSES:  1. History of breast cancer.  2. Acquired absence of right breast.   POSTOPERATIVE DIAGNOSES:  Same as preoperative diagnosis.   PROCEDURE:  1. Removal of right breast expander. 2. Placement of right breast expander.  SURGEON: Estefana Fritter, DO  ASSISTANT: Donnice Edelson, PA  ANESTHESIA:  General.   COMPLICATIONS: None.   IMPLANTS: [place] Sm[employer] [place] Breast Expander 455 cc (270 cc)  INDICATIONS FOR PROCEDURE:  The patient, Penny Gomez, is a 38 y.o. female born on [birth year], is here for further treatment after a mastectomy and placement of a tissue expander. She now presents for exchange of her expander for an implant.  She requires capsulotomies to better position the implant. MRN: [id number]  CONSENT:  Informed consent was obtained directly from the patient. Risks, benefits and alternatives were fully discussed. Specific risks including but not limited to bleeding, infection, hematoma, seroma, scarring, pain, implant infection, implant extrusion, capsular contracture, asymmetry, wound healing problems, and need for further surgery were all discussed. The patient did have an ample opportunity to have her questions answered to her satisfaction.   DESCRIPTION OF PROCEDURE:  The patient was taken to the operating room. SCDs were placed and IV antibiotics were given. The patient's chest was prepped and draped in a sterile fashion. A time out was performed and the implants to be used were identified.  One percent Xylocaine  with epinephrine  was used to infiltrate the area.   Right:  The old mastectomy scar was opened. Inspection of the pocket showed a normal healthy capsule and good integration of the matrix. There was cloudy fluid that was irrigated with antibiotic solution and vashe.  A drain was placed and secured with  the 3-0 Silk.  The expander was removed and the new one placed.  The tabs were secured with the 3-0 PDS.  The deep layer was closed with 3-0 PDS and 4-0 Monocryl subcuticular stitches.  Dermabond was applied.  A breast binder and ABD was applied.  The patient was allowed to wake from anesthesia and taken to the recovery room in satisfactory condition.   The advanced practice practitioner (APP) assisted throughout the case.  The APP was essential in retraction and counter traction when needed to make the case progress smoothly.  This retraction and assistance made it possible to see the tissue plans for the procedure.  The assistance was needed for blood control, tissue re-approximation and assisted with closure of the incision site.

## 2023-09-11 NOTE — Discharge Instructions (Signed)

## 2023-09-11 NOTE — Anesthesia Procedure Notes (Signed)
 Procedure Name: LMA Insertion Date/Time: 09/11/2023 10:23 AM  Performed by: Frost Kayla MATSU, CRNAPre-anesthesia Checklist: Emergency Drugs available, Patient identified, Suction available and Patient being monitored Patient Re-evaluated:Patient Re-evaluated prior to induction Oxygen Delivery Method: Circle system utilized Preoxygenation: Pre-oxygenation with 100% oxygen Induction Type: IV induction Ventilation: Mask ventilation without difficulty LMA: LMA inserted LMA Size: 4.0 Number of attempts: 1 Placement Confirmation: positive ETCO2 Tube secured with: Tape Dental Injury: Teeth and Oropharynx as per pre-operative assessment

## 2023-09-11 NOTE — Anesthesia Postprocedure Evaluation (Signed)
 Anesthesia Post Note  Patient: Penny Gomez  Procedure(s) Performed: REMOVAL, TISSUE EXPANDER, BREAST, BILATERAL, WITH BILATERAL IMPLANT IMPLANT INSERTION (Bilateral: Breast)     Patient location during evaluation: PACU Anesthesia Type: General Level of consciousness: awake and alert Pain management: pain level controlled Vital Signs Assessment: post-procedure vital signs reviewed and stable Respiratory status: spontaneous breathing, nonlabored ventilation, respiratory function stable and patient connected to nasal cannula oxygen Cardiovascular status: blood pressure returned to baseline and stable Postop Assessment: no apparent nausea or vomiting Anesthetic complications: no   No notable events documented.  Last Vitals:  Vitals:   09/11/23 1145 09/11/23 1158  BP: 102/68 106/84  Pulse: 75 76  Resp: 15 16  Temp:  (!) 36.3 C  SpO2: 98% 99%    Last Pain:  Vitals:   09/11/23 1158  TempSrc:   PainSc: 0-No pain                 Penny Gomez

## 2023-09-11 NOTE — Telephone Encounter (Signed)
 Called patient to update about surgery, discussed antibiotics and Lovenox .  Patient did not answer, did not leave voicemail message.  I was planning to notify patient that we would like her to be on doxycycline  for 2 weeks for prophylactic antibiotic coverage.  I also spoke with Dr. Lowery in regards to postoperative Lovenox  and was going to notify patient that postoperative Lovenox  not recommended due to surgical time being short.  If patient calls back, please have clinical team call patient to discuss telephone note with patient and if she has any additional questions please notify PA.  Prescription for doxycycline  sent to [facility] clinic pharmacy.

## 2023-09-12 ENCOUNTER — Encounter (HOSPITAL_BASED_OUTPATIENT_CLINIC_OR_DEPARTMENT_OTHER): Payer: Self-pay | Admitting: Plastic Surgery

## 2023-09-12 ENCOUNTER — Ambulatory Visit

## 2023-09-12 NOTE — Telephone Encounter (Signed)
 Sitlaly called in with a question about her antibiotics. She was on her way to pick up the Doxycycline  but wanted to know if she should stop the Bactrim  since she has 4 days left. I told her to take the last dose of the Bactrim  tonight and she can start the Doxycycline  in the morning. I let her know that Adina was in surgery and I would confirm the plan with him. I informed the patient that if Matt needed to change that plan, he would call her. Patient expressed understanding.

## 2023-09-14 LAB — AEROBIC/ANAEROBIC CULTURE W GRAM STAIN (SURGICAL/DEEP WOUND): Culture: NO GROWTH

## 2023-09-15 ENCOUNTER — Ambulatory Visit

## 2023-09-16 ENCOUNTER — Ambulatory Visit

## 2023-09-16 LAB — AEROBIC/ANAEROBIC CULTURE W GRAM STAIN (SURGICAL/DEEP WOUND)
Culture: NO GROWTH
Gram Stain: NONE SEEN

## 2023-09-17 ENCOUNTER — Ambulatory Visit: Admitting: Plastic Surgery

## 2023-09-17 ENCOUNTER — Ambulatory Visit

## 2023-09-17 ENCOUNTER — Encounter: Payer: Self-pay | Admitting: Plastic Surgery

## 2023-09-17 VITALS — BP 111/60 | HR 68

## 2023-09-17 DIAGNOSIS — C50919 Malignant neoplasm of unspecified site of unspecified female breast: Secondary | ICD-10-CM

## 2023-09-17 NOTE — Progress Notes (Signed)
 The patient is a 38 year old female here for follow-up after having her expander removed and replaced.  The output has been very minimal in the drain.  It is slightly cloudy.  There is no redness on the breast no sign of skin irritation.  Will continue to monitor and leave the drain in until next week.

## 2023-09-18 ENCOUNTER — Other Ambulatory Visit

## 2023-09-18 ENCOUNTER — Ambulatory Visit: Admitting: Oncology

## 2023-09-18 ENCOUNTER — Ambulatory Visit

## 2023-09-19 ENCOUNTER — Ambulatory Visit: Admitting: Oncology

## 2023-09-19 ENCOUNTER — Encounter: Admitting: Plastic Surgery

## 2023-09-19 ENCOUNTER — Ambulatory Visit

## 2023-09-19 ENCOUNTER — Other Ambulatory Visit

## 2023-09-22 ENCOUNTER — Ambulatory Visit

## 2023-09-23 ENCOUNTER — Ambulatory Visit: Admitting: Cardiology

## 2023-09-23 ENCOUNTER — Ambulatory Visit (INDEPENDENT_AMBULATORY_CARE_PROVIDER_SITE_OTHER): Admitting: Surgical

## 2023-09-23 ENCOUNTER — Ambulatory Visit

## 2023-09-23 VITALS — BP 101/58 | HR 69

## 2023-09-23 DIAGNOSIS — Z9889 Other specified postprocedural states: Secondary | ICD-10-CM

## 2023-09-23 DIAGNOSIS — C50919 Malignant neoplasm of unspecified site of unspecified female breast: Secondary | ICD-10-CM

## 2023-09-23 NOTE — Progress Notes (Signed)
 Patient is a 38 year old female here for follow-up after removal of right breast tissue expander, washout and replacement. She reports she is overall doing well.  Per drain log, output has been between 10 to 20 cc per 24 hours over the last 5 to 7 days.  Yesterday she had about 10 cc of output.  She is feeling well without any infectious symptoms.   She reports today that she has decided to proceed with Diep flaps, she is scheduled to see Dr. Nolene in November for consultation.  Chaperone present on exam On exam bilateral breast incisions are intact and appear to be healing well.  Right breast without any erythema or cellulitic changes.  She does have some swelling of bilateral breast, specifically the medial left breast.  Port-A-Cath in place over left chest wall.  Previous Port-A-Cath incision over right chest wall.  Well-healed.  Right JP drain is in place with serous drainage in bulb.  No subcutaneous fluid collection noted of either breast.  A/P:  Right drain was removed, patient tolerated this well.  Recommend changing gauze dressings once daily over drain site.  Discussed with patient to continue to closely monitor right breast for any additional swelling or changes.  There is no signs of overt infection on exam today.  Pictures taken and placed in patient's chart with patient's permission.  Continue with compressive garments, avoid strenuous activities.  We will have her plan to remain out of work 4 to 6 weeks after recent procedure.  Can complete any forms for her once received.

## 2023-09-24 ENCOUNTER — Ambulatory Visit

## 2023-09-24 ENCOUNTER — Ambulatory Visit: Attending: Oncology | Admitting: Occupational Therapy

## 2023-09-24 DIAGNOSIS — M25611 Stiffness of right shoulder, not elsewhere classified: Secondary | ICD-10-CM | POA: Insufficient documentation

## 2023-09-24 DIAGNOSIS — R293 Abnormal posture: Secondary | ICD-10-CM | POA: Insufficient documentation

## 2023-09-24 DIAGNOSIS — M25612 Stiffness of left shoulder, not elsewhere classified: Secondary | ICD-10-CM | POA: Insufficient documentation

## 2023-09-24 NOTE — Therapy (Signed)
 OUTPATIENT OCCUPATIONAL THERAPY BREAST CANCER POSTOP TREATMENT   Patient Name: Penny Gomez MRN: [id number] DOB:[birth year], 38 y.o., female Today's Date: 09/24/2023  END OF SESSION:  OT End of Session - 09/24/23 1043     Visit Number 4    Number of Visits 10    Date for OT Re-Evaluation 11/19/23    OT Start Time 0937    OT Stop Time 1002    OT Time Calculation (min) 25 min    Activity Tolerance Patient tolerated treatment well    Behavior During Therapy WFL for tasks assessed/performed          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   Vascular port complication    Past Surgical History:  Procedure Laterality Date   BREAST BIOPSY Right 05/20/2019   Affirm Bx- X- clip, neg   BREAST BIOPSY Left 10/02/2022   US  Bx, path pending   BREAST BIOPSY Left 10/02/2022   Us  Bx Node- path pending   BREAST BIOPSY Left 10/02/2022   US  LT BREAST BX W LOC DEV 1ST LESION IMG BX SPEC US  GUIDE 10/02/2022 [facility]   BREAST RECONSTRUCTION Bilateral 07/24/2023   Procedure: RECONSTRUCTION, BREAST;  Surgeon: Lowery Estefana RAMAN, DO;  Location: [facility];  Service: Plastics;  Laterality: Bilateral;  BILATERAL IMMEDIATE BREAST RECONSTRUCTION WITH EXPANDER AND FLEX HD PLACEMENT   CESAREAN SECTION     x 2   LAPAROSCOPIC BILATERAL SALPINGECTOMY Bilateral 02/09/2022   Procedure: LAPAROSCOPIC BILATERAL SALPINGECTOMY;  Surgeon: Connell Davies, MD;  Location: [facility];  Service: Gynecology;  Laterality: Bilateral;   MASTECTOMY W/ SENTINEL NODE BIOPSY Bilateral 07/24/2023    Procedure: MASTECTOMY WITH SENTINEL LYMPH NODE BIOPSY;  Surgeon: Tye Millet, DO;  Location: [facility];  Service: General;  Laterality: Bilateral;  SN biopsy left only   PORTACATH PLACEMENT Right 10/17/2022   Procedure: INSERTION PORT-A-CATH;  Surgeon: Desiderio Schanz, MD;  Location: [facility];  Service: General;  Laterality: Right;   PORTACATH PLACEMENT Left 02/10/2023   Procedure: INSERTION PORT-A-CATH;  Surgeon: Tye Millet, DO;  Location: [facility];  Service: General;  Laterality: Left;   REMOVAL OF BILATERAL TISSUE EXPANDERS WITH PLACEMENT OF BILATERAL BREAST IMPLANTS Bilateral 09/11/2023   Procedure: REMOVAL, TISSUE EXPANDER, BREAST, BILATERAL, WITH BILATERAL IMPLANT IMPLANT INSERTION;  Surgeon: Lowery Estefana RAMAN, DO;  Location: [facility];  Service: Plastics;  Laterality: Bilateral;  right expander removal and replacement.   TEE WITHOUT CARDIOVERSION N/A 01/02/2023   Procedure: TRANSESOPHAGEAL ECHOCARDIOGRAM (TEE);  Surgeon: Perla Evalene PARAS, MD;  Location: [facility];  Service: Cardiovascular;  Laterality: N/A;   TUBAL LIGATION     Patient Active Problem List   Diagnosis Date Noted   Encounter for antineoplastic immunotherapy 08/22/2023   Hypocalcemia 08/22/2023   Breast cancer (HCC) 07/24/2023   Palpitation 04/25/2023   Bloodstream infection due to Port-A-Cath 01/02/2023   Endocarditis 01/02/2023   Thrombocytopenia (HCC) 01/01/2023   Anemia 01/01/2023   Bacteremia 12/31/2022  Sepsis (HCC) 12/31/2022   Acute febrile illness 12/30/2022   Hypokalemia 12/06/2022   History of pulmonary embolism 11/24/2022   Vascular port complication 11/24/2022   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) 11/24/2022   Multifocal pneumonia 11/24/2022   Pulmonary infarct (HCC) 11/24/2022   Genetic testing 11/11/2022   BRCA1 gene mutation positive 11/11/2022   Encounter for antineoplastic chemotherapy 10/25/2022   Port-A-Cath in place 10/25/2022   Invasive carcinoma of breast (HCC)  10/10/2022   Goals of care, counseling/discussion 10/10/2022   Family history of cancer 10/10/2022   IDA (iron  deficiency anemia) 10/10/2022   Urinary frequency 03/19/2022    PCP: Donnie PA  REFERRING PROVIDER: Dr Babara MART DIAG: [id number]  THERAPY DIAG:  Stiffness of left shoulder, not elsewhere classified  Stiffness of right shoulder, not elsewhere classified  Abnormal posture  Rationale for Evaluation and Treatment: Rehabilitation  ONSET DATE: 09/23/22  SUBJECTIVE:                                                                                                                                                                                           SUBJECTIVE STATEMENT: I am feeling much better - breast not as tight and hard - still some tightness and little bit of swelling on the R - had surgery about 2 wks ago - I decided to have Flap surgery - have my appt with surgeon at [facility] in Nov - need to do now radiation - meeting Dr Lenn 18th   PERTINENT HISTORY:  Patient was diagnosed with left  breast cancer -has finished chemo -patient had on 07/24/2023 bilateral mastectomy with immediate reconstruction with expanders.  Patient is scheduled for removal of expanders and putting in silicon implants on the 09/11/2023.  Because I need to start radiation.   Plastic NOTE From 09/23/23 Patient is a 38 year old female here for follow-up after removal of right breast tissue expander, washout and replacement. Done on 09/11/23 Dr Lowery She reports she is overall doing well.   Per drain log, output has been between 10 to 20 cc per 24 hours over the last 5 to 7 days.  Yesterday she had about 10 cc of output.  She is feeling well without any infectious symptoms.    She reports today that she has decided to proceed with Diep flaps, she is scheduled to see Dr. Nolene in November for consultation.   Chaperone present on exam On exam bilateral breast incisions are intact and appear to be  healing well.  Right breast without any erythema or cellulitic changes.  She does have some swelling of bilateral breast, specifically the medial left breast.  Port-A-Cath  in place over left chest wall.  Previous Port-A-Cath incision over right chest wall.  Well-healed.  Right JP drain is in place with serous drainage in bulb.  No subcutaneous fluid collection noted of either breast.   A/P:   Right drain was removed, patient tolerated this well.  Recommend changing gauze dressings once daily over drain site.   Discussed with patient to continue to closely monitor right breast for any additional swelling or changes.   There is no signs of overt infection on exam today.  Pictures taken and placed in patient's chart with patient's permission.  Continue with compressive garments, avoid strenuous activities.  We will have her plan to remain out of work 4 to 6 weeks after recent procedure.  Can complete any forms for her once received.  PATIENT GOALS:   reduce lymphedema risk and learn post op HEP.   PAIN:  Are you having pain? No  PRECAUTIONS: Active CA      HAND DOMINANCE: right  WEIGHT BEARING RESTRICTIONS: No  FALLS:  Has patient fallen in last 6 months? No  LIVING ENVIRONMENT: Patient lives with: family  OCCUPATION: Works at [employer] in [employer].  Just started yesterday there.  Has a 49 and a 37 year old likes to do things around the house.     OBJECTIVE:  COGNITION: Overall cognitive status: Within functional limits for tasks assessed    POSTURE:  rounded shoulders posture-guarding chest  UPPER EXTREMITY AROM/PROM:   Bill Shoulder active range of motion within normal limits at preop 08/27/23 : Shoulder flexion 150 degrees.  Shoulder abduction more  into scaption 150.  External rotation within functional limits at 90 degrees but decreased overhead  09/24/23 L shoulder AROM WNL - R 160 flexion and ABD - slight pull at end range  Ext rotation WFL -  slight pull at end of Range     CERVICAL AROM: All within normal limits:      UPPER EXTREMITY STRENGTH: 5/5 for bilateral upper extremity at all joints preop-not tested now.  LYMPHEDEMA ASSESSMENTS:   LANDMARK RIGHT   eval  15 cm proximal to olecranon process 27.5  Olecranon process 23  15 cm proximal to ulnar styloid process 22.3  Just proximal to ulnar styloid process 13.8  Across hand at thumb web space   At base of 2nd digit   (Blank rows = not tested)  LANDMARK LEFT   eval  15 cm proximal to olecranon process 26.4  Olecranon process 22.5  15 cm proximal to ulnar styloid process 22  Just proximal to ulnar styloid process 13.5  Across hand at thumb web space   At base of 2nd digit   (Blank rows = not tested)  L-DEX LYMPHEDEMA SCREENING:  L- dex score 4.3 prior to chemo Risk limb L and R hand dominant   The patient was assessed using the L-Dex machine today to produce a lymphedema index baseline score. The patient will be reassessed on a regular basis (typically every 3 months) to obtain new L-Dex scores. If the score is > 6.5 points away from his/her baseline score indicating onset of subclinical lymphedema, it will be recommended to wear a compression garment for 4 weeks, 12 hours per day and then be reassessed. If the score continues to be > 6.5 points from baseline at reassessment, we will initiate lymphedema treatment. Assessing in this manner has a 95% rate of preventing clinically significant lymphedema.   L-DEX FLOWSHEETS - 09/24/23 1000  L-DEX LYMPHEDEMA SCREENING   Measurement Type Unilateral    L-DEX MEASUREMENT EXTREMITY Upper Extremity    POSITION  Standing    DOMINANT SIDE Right    At Risk Side Left    BASELINE SCORE (UNILATERAL) -5    L-DEX SCORE (UNILATERAL) -2.9    VALUE CHANGE (UNILAT) 2.1         .  09/24/23 Pt report decrease tightness and stiffness since last surgery 4 weeks ago. Patient had removal of right breast tissue expander,  washout and replacement Patient with decreased endrange shoulder flexion as well as abduction and external rotation.   With some postop edema in the right chest/breast.  Surgeons monitoring.    Reviewed with patient Wall slides for active assisted range of motion for shoulder flexion and abduction 10 reps to 2-3 times a day.   As well as external rotation in supine. Denies pain. Patient L-Dex score within normal range see above Patient decided to have Diep flap surgery in November appointment with surgeon Radiation is the plan.  Meeting with Dr. Camelia 18 August PATIENT EDUCATION:  Education details: Lymphedema risk reduction and post op shoulder/posture HEP Person educated: Patient Education method: Explanation, Demonstration, Handout Education comprehension: Patient verbalized understanding and returned demonstration    ASSESSMENT:  CLINICAL IMPRESSION:  She had chemotherapy and had bilateral mastectomy with immediate expander placement on 07/24/2023.  Patient had about 3 weeks ago removal of right breast tissue expander, washout and replacement done by Dr. Lowery.  Patient still have some postop edema on the right chest and breast.  Monitored by surgeon.  AROM flexion normal.  Slight pull at the end range on the right flexion abduction external rotation.  Patient to continue with active assisted range of motion for shoulder flexion abduction external rotation.  Patient meeting with Dr. Camelia.  For future radiation.  Patient to continue with home exercises and follow-up with me after radiation.  Patient able to get into radiation position.  Patient L-Dex score was repeated within normal range.  Patient to follow-up with me after radiation to reassess.  She  will benefit from a post op OT reassessment to determine needs and from L-Dex screens every 3 months for 2 years to detect subclinical lymphedema.  Pt will benefit from skilled therapeutic intervention to improve on the following  deficits: Decreased knowledge of precautions and lymphedema education, impaired UE functional use, pain, decreased ROM, postural dysfunction.   OT treatment/interventions: ADL/self-care home management, pt/family education, therapeutic exercise,manual therapy  REHAB POTENTIAL: Good  CLINICAL DECISION MAKING: Stable/uncomplicated  EVALUATION COMPLEXITY: Low   GOALS: Goals reviewed with patient? YES  LONG TERM GOALS: (STG=LTG)    Name Target Date Goal status  1 Pt will be able to verbalize understanding of pertinent lymphedema risk reduction practices relevant to her dx specifically related to skin care.  Baseline:  No knowledge 11/18/13 Continue    2 Pt will be able to return demo and/or verbalize understanding of the post op HEP related to regaining shoulder ROM. Baseline:  No knowledge 11/19/23 Continues         4 Pt will demo she has regained full shoulder ROM and function post operatively compared to baselines.  Baseline: See objective measurements taken today. 11/19/23 Continue    PLAN:  OT FREQUENCY/DURATION: 6 visits in 12 weeks  PLAN FOR NEXT SESSION: will reassess 3-4 weeks post op to determine needs.     Occupational Therapy Information for After Breast Cancer Surgery/Treatment:  Lymphedema is a swelling condition  that you may be at risk for in your arm if you have lymph nodes removed from the armpit area.  After a sentinel node biopsy, the risk is approximately 5-9% and is higher after an axillary node dissection.  There is treatment available for this condition and it is not life-threatening.  Contact your physician or occupational therapist with concerns. You may begin the 4 shoulder/posture exercises (see additional sheet) when permitted by your physician (typically a week after surgery).  If you have drains, you may need to wait until those are removed before beginning range of motion exercises.  A general recommendation is to not lift your arms above shoulder height  until drains are removed.  These exercises should be done to your tolerance and gently.  This is not a no pain/no gain type of recovery so listen to your body and stretch into the range of motion that you can tolerate, stopping if you have pain.  If you are having immediate reconstruction, ask your plastic surgeon about doing exercises as he or she may want you to wait. We encourage you to watch the ABC (After Breast Cancer)  video. You will learn information related to lymphedema risk, prevention and treatment and additional exercises to regain mobility following surgery.  While undergoing any medical procedure or treatment, try to avoid blood pressure being taken or needle sticks from occurring on the arm on the side of cancer.   This recommendation begins after surgery and continues for the rest of your life.  This may help reduce your risk of getting lymphedema (swelling in your arm). An excellent resource for those seeking information on lymphedema is the National Lymphedema Network's web site. It can be accessed at www.lymphnet.org If you notice swelling in your hand, arm or breast at any time following surgery (even if it is many years from now), please contact your doctor or occupational therapist to discuss this.  Lymphedema can be treated at any time but it is easier for you if it is treated early on.  If you feel like your shoulder motion is not returning to normal in a reasonable amount of time, please contact your surgeon or occupational therapist.  [facility] [phone]. [address], [place], [place] [address]      Ancel Peters, OTR/L,CLT 09/24/2023, 2:13 PM

## 2023-09-25 ENCOUNTER — Ambulatory Visit

## 2023-09-25 ENCOUNTER — Ambulatory Visit (INDEPENDENT_AMBULATORY_CARE_PROVIDER_SITE_OTHER)

## 2023-09-25 DIAGNOSIS — Z09 Encounter for follow-up examination after completed treatment for conditions other than malignant neoplasm: Secondary | ICD-10-CM

## 2023-09-25 NOTE — Progress Notes (Signed)
 Patient is a 38 year old female here for follow-up after removal of right breast tissue expander, washout and replacement. She reports she is overall doing well, yet her left tissue expander feels malpositioned. Patient states she noticed this while sleeping yesterday. No infectious symptoms.    Chaperone present on exam On exam bilateral breast incisions are intact and appear to be healing well.  Right breast without any erythema or cellulitic changes.  She does have some swelling of bilateral breast, specifically the medial left breast.  Port-A-Cath in place over left chest wall.  Previous Port-A-Cath incision over right chest wall.  Well-healed. No subcutaneous fluid collection noted of either breast. The left tissue expander is malpositioned with base facing anteriorly. Repositioned manually    A/P: Discussed with patient to continue to monitor. This may happen again, if it does she can return to clinic for repositioning.  Explained to her this is not an emergency. Continue with compressive garments, avoid strenuous activities.  Follow up with Dr. Lowery next week.  Kimanh Templeman M. Rhodie Cienfuegos, MD [facility]

## 2023-09-29 ENCOUNTER — Ambulatory Visit
Admission: RE | Admit: 2023-09-29 | Discharge: 2023-09-29 | Disposition: A | Discharge status: Home / Self Care | Source: Ambulatory Visit | Attending: Radiation Oncology | Admitting: Radiation Oncology

## 2023-09-29 ENCOUNTER — Encounter: Payer: Self-pay | Admitting: Surgical

## 2023-09-29 ENCOUNTER — Ambulatory Visit (INDEPENDENT_AMBULATORY_CARE_PROVIDER_SITE_OTHER): Admitting: Surgical

## 2023-09-29 VITALS — Temp 96.4°F | Resp 14 | Ht 61.0 in | Wt 148.0 lb

## 2023-09-29 VITALS — BP 108/75 | HR 71 | Ht 61.0 in | Wt 150.0 lb

## 2023-09-29 DIAGNOSIS — Z9889 Other specified postprocedural states: Secondary | ICD-10-CM

## 2023-09-29 DIAGNOSIS — Z171 Estrogen receptor negative status [ER-]: Secondary | ICD-10-CM | POA: Insufficient documentation

## 2023-09-29 DIAGNOSIS — C50512 Malignant neoplasm of lower-outer quadrant of left female breast: Secondary | ICD-10-CM | POA: Diagnosis present

## 2023-09-29 DIAGNOSIS — C50919 Malignant neoplasm of unspecified site of unspecified female breast: Secondary | ICD-10-CM

## 2023-09-29 DIAGNOSIS — Z09 Encounter for follow-up examination after completed treatment for conditions other than malignant neoplasm: Secondary | ICD-10-CM

## 2023-09-29 NOTE — Progress Notes (Signed)
 38 year old female here for follow-up on her bilateral breast reconstruction.  She most recently underwent removal of right breast tissue expander, washout and replacement with Dr. Lowery on 09/11/2023.  She is overall doing well, but has noticed that the left breast tissue expander has been flipping during sleeping.  She has not been wearing compression while sleeping.  She is not having any other concerns.  She is scheduled to see radiation oncology today for initial consult and to discuss radiation planning.  She is scheduled to see Dr. Nolene at [facility] plastic surgery to discuss Diep flaps in November.  She is not interested in implant-based reconstruction at this time, expanders are in place in preparation for Diep flaps.  Chaperone present on exam On exam bilateral mastectomy flaps are viable, breast incisions are intact.  There is no erythema or cellulitic changes noted of either breast.  She does continue to have some medial breast swelling bilaterally.  Seems to have some increased fullness in these areas as well of soft tissue, no obvious signs of infection.  Left breast tissue expander is flipped on exam today  Left chest wall Port-A-Cath in place.   A/P:  Expander was flipped, patient tolerated this well.  Recommend wearing compressive garments 24/7 to prevent expander flipping.  Recommend following up in 2 weeks for reevaluation.  No signs of infection or concern on exam.  Continue to avoid strenuous activities or heavy lifting.

## 2023-09-29 NOTE — Progress Notes (Signed)
 Radiation Oncology Follow up Note  Name: Penny Gomez   Date:   09/29/2023 MRN:  [id number] DOB: [birth year]    This 38 y.o. female presents to the clinic today for follow-up of stage IIIb (T3 N0 M0) triple negative invasive mammary carcinoma grade 3 status post bilateral mastectomies and tissue expanders in place.  Patient is BRCA1 positive her breast cancer was the left side.SABRA  REFERRING PROVIDER: Sherial Bail, MD  HPI: Patient is a 38 year old female originally consulted back in early July for triple negative.  Invasive mammary carcinoma the left breast.  Initially she had a 1.6 cm hypoechoic irregular mass of the left breast 8 o'clock position 6 cm from nipple.  There was a mildly thickened left axillary node which was biopsied and negative.  Patient underwent neoadjuvant chemotherapy with CarboTaxol and Keytruda  with MRI showing positive response to treatment.  She then underwent bilateral mastectomies with no evidence of residual disease in the left breast.  2 sentinel lymph nodes were both negative for malignancy.  She has expanders in place.  Her left breast implant flipped last week and was corrected by plastic surgeon t recently her right breast it was leaking somewhat she had her tissue expander replaced recently.  Patient is scheduled forDiep flaps in November seen here today to commence with her left whole breast radiation.  She is otherwise asymptomatic.  COMPLICATIONS OF TREATMENT: none  FOLLOW UP COMPLIANCE: keeps appointments   PHYSICAL EXAM:  Temp (!) 96.4 F (35.8 C) (Tympanic)   Resp 14   Ht 5' 1 (1.549 m)   Wt 148 lb (67.1 kg)   LMP 08/21/2023 (Approximate)   BMI 27.96 kg/m  Patient has bilateral tissue expanders in.  They appear properly placed.  No dominant masses noted in either breast.  No axillary or supraclavicular adenopathy is identified.  Well-developed well-nourished patient in NAD. HEENT reveals PERLA, EOMI, discs not visualized.  Oral cavity is clear.  No oral mucosal lesions are identified. Neck is clear without evidence of cervical or supraclavicular adenopathy. Lungs are clear to A&P. Cardiac examination is essentially unremarkable with regular rate and rhythm without murmur rub or thrill. Abdomen is benign with no organomegaly or masses noted. Motor sensory and DTR levels are equal and symmetric in the upper and lower extremities. Cranial nerves II through XII are grossly intact. Proprioception is intact. No peripheral adenopathy or edema is identified. No motor or sensory levels are noted. Crude visual fields are within normal range.  RADIOLOGY RESULTS: No current films for review  PLAN: Present time we will go ahead with deep inspiration breath-hold hypofractionated radiation therapy to her left breast without boost.  Risks and benefits of treatment occluding possible skin reaction possible further contraction around her expander fatigue possible inclusion of superficial lung all were discussed in detail with the patient.  We will try deep inspiration breath-hold to move the heart as far as way from her field as possible.  I have personally set up and ordered CT simulation for later this week.  I would like to take this opportunity to thank you for allowing me to participate in the care of your patient.SABRA Marcey Penton, MD

## 2023-10-01 ENCOUNTER — Other Ambulatory Visit: Payer: Self-pay

## 2023-10-01 ENCOUNTER — Ambulatory Visit
Admission: RE | Admit: 2023-10-01 | Discharge: 2023-10-01 | Disposition: A | Discharge status: Home / Self Care | Source: Ambulatory Visit | Attending: Radiation Oncology | Admitting: Radiation Oncology

## 2023-10-01 ENCOUNTER — Encounter: Admitting: Surgical

## 2023-10-01 DIAGNOSIS — C50512 Malignant neoplasm of lower-outer quadrant of left female breast: Secondary | ICD-10-CM | POA: Diagnosis not present

## 2023-10-02 DIAGNOSIS — C50512 Malignant neoplasm of lower-outer quadrant of left female breast: Secondary | ICD-10-CM | POA: Diagnosis not present

## 2023-10-03 ENCOUNTER — Other Ambulatory Visit: Payer: Self-pay | Admitting: *Deleted

## 2023-10-03 DIAGNOSIS — Z171 Estrogen receptor negative status [ER-]: Secondary | ICD-10-CM

## 2023-10-06 ENCOUNTER — Telehealth: Payer: Self-pay | Admitting: Surgical

## 2023-10-06 NOTE — Telephone Encounter (Signed)
 lvmail 10-06-23 to r/s

## 2023-10-08 ENCOUNTER — Ambulatory Visit
Admission: RE | Admit: 2023-10-08 | Discharge: 2023-10-08 | Disposition: A | Discharge status: Home / Self Care | Source: Ambulatory Visit | Attending: Radiation Oncology | Admitting: Radiation Oncology

## 2023-10-08 DIAGNOSIS — C50512 Malignant neoplasm of lower-outer quadrant of left female breast: Secondary | ICD-10-CM | POA: Diagnosis not present

## 2023-10-09 ENCOUNTER — Other Ambulatory Visit: Payer: Self-pay | Admitting: Oncology

## 2023-10-09 ENCOUNTER — Ambulatory Visit
Admission: RE | Admit: 2023-10-09 | Discharge: 2023-10-09 | Disposition: A | Discharge status: Home / Self Care | Source: Ambulatory Visit | Attending: Radiation Oncology | Admitting: Radiation Oncology

## 2023-10-09 ENCOUNTER — Other Ambulatory Visit: Payer: Self-pay

## 2023-10-09 DIAGNOSIS — C50512 Malignant neoplasm of lower-outer quadrant of left female breast: Secondary | ICD-10-CM | POA: Diagnosis not present

## 2023-10-09 DIAGNOSIS — C50919 Malignant neoplasm of unspecified site of unspecified female breast: Secondary | ICD-10-CM

## 2023-10-09 LAB — RAD ONC ARIA SESSION SUMMARY
Course Elapsed Days: 0
Plan Fractions Treated to Date: 1
Plan Prescribed Dose Per Fraction: 2.66 Gy
Plan Total Fractions Prescribed: 16
Plan Total Prescribed Dose: 42.56 Gy
Reference Point Dosage Given to Date: 2.66 Gy
Reference Point Session Dosage Given: 2.66 Gy
Session Number: 1

## 2023-10-10 ENCOUNTER — Ambulatory Visit
Admission: RE | Admit: 2023-10-10 | Discharge: 2023-10-10 | Disposition: A | Discharge status: Home / Self Care | Source: Ambulatory Visit | Attending: Radiation Oncology | Admitting: Radiation Oncology

## 2023-10-10 ENCOUNTER — Other Ambulatory Visit: Payer: Self-pay | Admitting: Oncology

## 2023-10-10 ENCOUNTER — Other Ambulatory Visit: Payer: Self-pay

## 2023-10-10 DIAGNOSIS — C50512 Malignant neoplasm of lower-outer quadrant of left female breast: Secondary | ICD-10-CM | POA: Diagnosis not present

## 2023-10-10 LAB — RAD ONC ARIA SESSION SUMMARY
Course Elapsed Days: 1
Plan Fractions Treated to Date: 2
Plan Prescribed Dose Per Fraction: 2.66 Gy
Plan Total Fractions Prescribed: 16
Plan Total Prescribed Dose: 42.56 Gy
Reference Point Dosage Given to Date: 5.32 Gy
Reference Point Session Dosage Given: 2.66 Gy
Session Number: 2

## 2023-10-11 ENCOUNTER — Other Ambulatory Visit: Payer: Self-pay

## 2023-10-14 ENCOUNTER — Encounter: Payer: Self-pay | Admitting: *Deleted

## 2023-10-14 ENCOUNTER — Encounter: Admitting: Surgical

## 2023-10-14 ENCOUNTER — Ambulatory Visit
Admission: RE | Admit: 2023-10-14 | Discharge: 2023-10-14 | Disposition: A | Discharge status: Home / Self Care | Source: Ambulatory Visit | Attending: Radiation Oncology | Admitting: Radiation Oncology

## 2023-10-14 ENCOUNTER — Other Ambulatory Visit: Payer: Self-pay

## 2023-10-14 DIAGNOSIS — C50512 Malignant neoplasm of lower-outer quadrant of left female breast: Secondary | ICD-10-CM | POA: Diagnosis present

## 2023-10-14 DIAGNOSIS — Z171 Estrogen receptor negative status [ER-]: Secondary | ICD-10-CM | POA: Insufficient documentation

## 2023-10-14 LAB — RAD ONC ARIA SESSION SUMMARY
Course Elapsed Days: 5
Plan Fractions Treated to Date: 3
Plan Prescribed Dose Per Fraction: 2.66 Gy
Plan Total Fractions Prescribed: 16
Plan Total Prescribed Dose: 42.56 Gy
Reference Point Dosage Given to Date: 7.98 Gy
Reference Point Session Dosage Given: 2.66 Gy
Session Number: 3

## 2023-10-15 ENCOUNTER — Ambulatory Visit
Admission: RE | Admit: 2023-10-15 | Discharge: 2023-10-15 | Disposition: A | Discharge status: Home / Self Care | Source: Ambulatory Visit | Attending: Radiation Oncology | Admitting: Radiation Oncology

## 2023-10-15 ENCOUNTER — Other Ambulatory Visit: Payer: Self-pay

## 2023-10-15 ENCOUNTER — Inpatient Hospital Stay

## 2023-10-15 DIAGNOSIS — C50512 Malignant neoplasm of lower-outer quadrant of left female breast: Secondary | ICD-10-CM | POA: Diagnosis not present

## 2023-10-15 DIAGNOSIS — Z7901 Long term (current) use of anticoagulants: Secondary | ICD-10-CM | POA: Insufficient documentation

## 2023-10-15 DIAGNOSIS — Z1722 Progesterone receptor negative status: Secondary | ICD-10-CM | POA: Insufficient documentation

## 2023-10-15 DIAGNOSIS — Z86711 Personal history of pulmonary embolism: Secondary | ICD-10-CM | POA: Insufficient documentation

## 2023-10-15 DIAGNOSIS — Z171 Estrogen receptor negative status [ER-]: Secondary | ICD-10-CM | POA: Insufficient documentation

## 2023-10-15 DIAGNOSIS — C50312 Malignant neoplasm of lower-inner quadrant of left female breast: Secondary | ICD-10-CM | POA: Insufficient documentation

## 2023-10-15 DIAGNOSIS — Z1501 Genetic susceptibility to malignant neoplasm of breast: Secondary | ICD-10-CM | POA: Insufficient documentation

## 2023-10-15 DIAGNOSIS — Z1732 Human epidermal growth factor receptor 2 negative status: Secondary | ICD-10-CM | POA: Insufficient documentation

## 2023-10-15 LAB — CBC (CANCER CENTER ONLY)
HCT: 34.8 % — ABNORMAL LOW (ref 36.0–46.0)
Hemoglobin: 11.3 g/dL — ABNORMAL LOW (ref 12.0–15.0)
MCH: 27.4 pg (ref 26.0–34.0)
MCHC: 32.5 g/dL (ref 30.0–36.0)
MCV: 84.5 fL (ref 80.0–100.0)
Platelet Count: 255 K/uL (ref 150–400)
RBC: 4.12 MIL/uL (ref 3.87–5.11)
RDW: 13.2 % (ref 11.5–15.5)
WBC Count: 8.5 K/uL (ref 4.0–10.5)
nRBC: 0 % (ref 0.0–0.2)

## 2023-10-15 LAB — RAD ONC ARIA SESSION SUMMARY
Course Elapsed Days: 6
Plan Fractions Treated to Date: 4
Plan Prescribed Dose Per Fraction: 2.66 Gy
Plan Total Fractions Prescribed: 16
Plan Total Prescribed Dose: 42.56 Gy
Reference Point Dosage Given to Date: 10.64 Gy
Reference Point Session Dosage Given: 2.66 Gy
Session Number: 4

## 2023-10-16 ENCOUNTER — Ambulatory Visit
Admission: RE | Admit: 2023-10-16 | Discharge: 2023-10-16 | Disposition: A | Discharge status: Home / Self Care | Source: Ambulatory Visit | Attending: Radiation Oncology | Admitting: Radiation Oncology

## 2023-10-16 ENCOUNTER — Other Ambulatory Visit: Payer: Self-pay

## 2023-10-16 DIAGNOSIS — C50512 Malignant neoplasm of lower-outer quadrant of left female breast: Secondary | ICD-10-CM | POA: Diagnosis not present

## 2023-10-16 LAB — RAD ONC ARIA SESSION SUMMARY
Course Elapsed Days: 7
Plan Fractions Treated to Date: 5
Plan Prescribed Dose Per Fraction: 2.66 Gy
Plan Total Fractions Prescribed: 16
Plan Total Prescribed Dose: 42.56 Gy
Reference Point Dosage Given to Date: 13.3 Gy
Reference Point Session Dosage Given: 2.66 Gy
Session Number: 5

## 2023-10-17 ENCOUNTER — Ambulatory Visit
Admission: RE | Admit: 2023-10-17 | Discharge: 2023-10-17 | Disposition: A | Discharge status: Home / Self Care | Source: Ambulatory Visit | Attending: Radiation Oncology | Admitting: Radiation Oncology

## 2023-10-17 ENCOUNTER — Other Ambulatory Visit: Payer: Self-pay

## 2023-10-17 DIAGNOSIS — C50512 Malignant neoplasm of lower-outer quadrant of left female breast: Secondary | ICD-10-CM | POA: Diagnosis not present

## 2023-10-17 LAB — RAD ONC ARIA SESSION SUMMARY
Course Elapsed Days: 8
Plan Fractions Treated to Date: 6
Plan Prescribed Dose Per Fraction: 2.66 Gy
Plan Total Fractions Prescribed: 16
Plan Total Prescribed Dose: 42.56 Gy
Reference Point Dosage Given to Date: 15.96 Gy
Reference Point Session Dosage Given: 2.66 Gy
Session Number: 6

## 2023-10-20 ENCOUNTER — Ambulatory Visit
Admission: RE | Admit: 2023-10-20 | Discharge: 2023-10-20 | Disposition: A | Discharge status: Home / Self Care | Source: Ambulatory Visit | Attending: Radiation Oncology | Admitting: Radiation Oncology

## 2023-10-20 ENCOUNTER — Other Ambulatory Visit: Payer: Self-pay

## 2023-10-20 DIAGNOSIS — C50512 Malignant neoplasm of lower-outer quadrant of left female breast: Secondary | ICD-10-CM | POA: Diagnosis not present

## 2023-10-20 LAB — RAD ONC ARIA SESSION SUMMARY
Course Elapsed Days: 11
Plan Fractions Treated to Date: 7
Plan Prescribed Dose Per Fraction: 2.66 Gy
Plan Total Fractions Prescribed: 16
Plan Total Prescribed Dose: 42.56 Gy
Reference Point Dosage Given to Date: 18.62 Gy
Reference Point Session Dosage Given: 2.66 Gy
Session Number: 7

## 2023-10-21 ENCOUNTER — Other Ambulatory Visit: Payer: Self-pay

## 2023-10-21 ENCOUNTER — Ambulatory Visit
Admission: RE | Admit: 2023-10-21 | Discharge: 2023-10-21 | Disposition: A | Discharge status: Home / Self Care | Source: Ambulatory Visit | Attending: Radiation Oncology | Admitting: Radiation Oncology

## 2023-10-21 DIAGNOSIS — C50512 Malignant neoplasm of lower-outer quadrant of left female breast: Secondary | ICD-10-CM | POA: Diagnosis not present

## 2023-10-21 LAB — RAD ONC ARIA SESSION SUMMARY
Course Elapsed Days: 12
Plan Fractions Treated to Date: 8
Plan Prescribed Dose Per Fraction: 2.66 Gy
Plan Total Fractions Prescribed: 16
Plan Total Prescribed Dose: 42.56 Gy
Reference Point Dosage Given to Date: 21.28 Gy
Reference Point Session Dosage Given: 2.66 Gy
Session Number: 8

## 2023-10-22 ENCOUNTER — Ambulatory Visit
Admission: RE | Admit: 2023-10-22 | Discharge: 2023-10-22 | Disposition: A | Discharge status: Home / Self Care | Source: Ambulatory Visit | Attending: Radiation Oncology | Admitting: Radiation Oncology

## 2023-10-22 ENCOUNTER — Other Ambulatory Visit: Payer: Self-pay

## 2023-10-22 DIAGNOSIS — C50512 Malignant neoplasm of lower-outer quadrant of left female breast: Secondary | ICD-10-CM | POA: Diagnosis not present

## 2023-10-22 LAB — RAD ONC ARIA SESSION SUMMARY
Course Elapsed Days: 13
Plan Fractions Treated to Date: 9
Plan Prescribed Dose Per Fraction: 2.66 Gy
Plan Total Fractions Prescribed: 16
Plan Total Prescribed Dose: 42.56 Gy
Reference Point Dosage Given to Date: 23.94 Gy
Reference Point Session Dosage Given: 2.66 Gy
Session Number: 9

## 2023-10-23 ENCOUNTER — Other Ambulatory Visit: Payer: Self-pay

## 2023-10-23 ENCOUNTER — Ambulatory Visit
Admission: RE | Admit: 2023-10-23 | Discharge: 2023-10-23 | Disposition: A | Discharge status: Home / Self Care | Source: Ambulatory Visit | Attending: Radiation Oncology | Admitting: Radiation Oncology

## 2023-10-23 DIAGNOSIS — C50512 Malignant neoplasm of lower-outer quadrant of left female breast: Secondary | ICD-10-CM | POA: Diagnosis not present

## 2023-10-23 LAB — RAD ONC ARIA SESSION SUMMARY
Course Elapsed Days: 14
Plan Fractions Treated to Date: 10
Plan Prescribed Dose Per Fraction: 2.66 Gy
Plan Total Fractions Prescribed: 16
Plan Total Prescribed Dose: 42.56 Gy
Reference Point Dosage Given to Date: 26.6 Gy
Reference Point Session Dosage Given: 2.66 Gy
Session Number: 10

## 2023-10-24 ENCOUNTER — Ambulatory Visit
Admission: RE | Admit: 2023-10-24 | Discharge: 2023-10-24 | Disposition: A | Discharge status: Home / Self Care | Source: Ambulatory Visit | Attending: Radiation Oncology | Admitting: Radiation Oncology

## 2023-10-24 ENCOUNTER — Other Ambulatory Visit: Payer: Self-pay

## 2023-10-24 ENCOUNTER — Ambulatory Visit (INDEPENDENT_AMBULATORY_CARE_PROVIDER_SITE_OTHER): Admitting: Student

## 2023-10-24 ENCOUNTER — Encounter: Payer: Self-pay | Admitting: Plastic Surgery

## 2023-10-24 DIAGNOSIS — Z9889 Other specified postprocedural states: Secondary | ICD-10-CM

## 2023-10-24 DIAGNOSIS — C50512 Malignant neoplasm of lower-outer quadrant of left female breast: Secondary | ICD-10-CM | POA: Diagnosis not present

## 2023-10-24 LAB — RAD ONC ARIA SESSION SUMMARY
Course Elapsed Days: 15
Plan Fractions Treated to Date: 11
Plan Prescribed Dose Per Fraction: 2.66 Gy
Plan Total Fractions Prescribed: 16
Plan Total Prescribed Dose: 42.56 Gy
Reference Point Dosage Given to Date: 29.26 Gy
Reference Point Session Dosage Given: 1.3138 Gy
Session Number: 11

## 2023-10-24 NOTE — Progress Notes (Signed)
 Patient is a 38 year old female with history of bilateral breast reconstruction.  She most recently underwent removal of her right breast tissue expander, washout and replacement with Dr. Lowery on 09/11/2023.  Patient is 6 weeks postop.  She presents to the clinic today for postoperative follow-up.  Patient was last seen in the clinic on 09/29/2023.  At this visit, patient was doing well, but the left tissue expander had been flipping during sleeping.  Patient also mention she was scheduled to see radiation oncology for initial consult and to discuss radiation.  Patient was also scheduled to see Dr. Nolene at [facility] plastic surgery to discuss DIEP flaps in November.  Patient reported she was not interested in implant-based reconstruction at this time.  On exam, bilateral mastectomy flaps were viable, incisions were intact.  There was some medial breast swelling.  There were no signs of infection.  Left breast expander was flipped on exam today.  Left chest Port-A-Cath was placed.  Expander was flipped.  It was recommended that patient wear compressive garments 24/7 to prevent the expander from flipping.  Today, patient reports she is doing well.  She denies any new issues or concerns.  She reports that the expander has not flipped on the left side since wearing the compression bra.  She denies any fevers or chills.  Patient does reports she has started radiation and is finishing up next Friday.  Chaperone present on exam.  On exam, patient is sitting upright in no acute distress.  Breasts are overall soft and symmetric.  Expanders are in place.  There is no overlying erythema, no fluid collection palpated on exam.  Incisions appear to be well-healed.  There are no signs of infection on exam.  I recommended that patient continue with compression bra to help prevent the expanders from flipping.  I discussed with the patient she may start increasing her activities, but recommended that she very slowly gradually  work her way up to more activities to also help avoid the expander from flipping.  She expressed understanding.  Patient states that she has an appointment with Dr. Nolene at [facility] in November.  Patient to see Dr. Lowery before that appointment for reevaluation.  I instructed the patient in the meantime to call if she has any questions or concerns about anything.  Pictures were obtained of the patient and placed in the chart with the patient's or guardian's permission.

## 2023-10-26 ENCOUNTER — Other Ambulatory Visit: Payer: Self-pay

## 2023-10-27 ENCOUNTER — Other Ambulatory Visit: Payer: Self-pay

## 2023-10-27 ENCOUNTER — Ambulatory Visit
Admission: RE | Admit: 2023-10-27 | Discharge: 2023-10-27 | Disposition: A | Discharge status: Home / Self Care | Source: Ambulatory Visit | Attending: Radiation Oncology | Admitting: Radiation Oncology

## 2023-10-27 DIAGNOSIS — C50512 Malignant neoplasm of lower-outer quadrant of left female breast: Secondary | ICD-10-CM | POA: Diagnosis not present

## 2023-10-27 LAB — RAD ONC ARIA SESSION SUMMARY
Course Elapsed Days: 18
Plan Fractions Treated to Date: 12
Plan Prescribed Dose Per Fraction: 2.66 Gy
Plan Total Fractions Prescribed: 16
Plan Total Prescribed Dose: 42.56 Gy
Reference Point Dosage Given to Date: 31.92 Gy
Reference Point Session Dosage Given: 2.66 Gy
Session Number: 12

## 2023-10-28 ENCOUNTER — Ambulatory Visit
Admission: RE | Admit: 2023-10-28 | Discharge: 2023-10-28 | Disposition: A | Discharge status: Home / Self Care | Source: Ambulatory Visit | Attending: Radiation Oncology | Admitting: Radiation Oncology

## 2023-10-28 ENCOUNTER — Other Ambulatory Visit: Payer: Self-pay

## 2023-10-28 DIAGNOSIS — C50512 Malignant neoplasm of lower-outer quadrant of left female breast: Secondary | ICD-10-CM | POA: Diagnosis not present

## 2023-10-28 LAB — RAD ONC ARIA SESSION SUMMARY
Course Elapsed Days: 19
Plan Fractions Treated to Date: 13
Plan Prescribed Dose Per Fraction: 2.66 Gy
Plan Total Fractions Prescribed: 16
Plan Total Prescribed Dose: 42.56 Gy
Reference Point Dosage Given to Date: 34.58 Gy
Reference Point Session Dosage Given: 2.6553 Gy
Session Number: 13

## 2023-10-29 ENCOUNTER — Ambulatory Visit
Admission: RE | Admit: 2023-10-29 | Discharge: 2023-10-29 | Disposition: A | Discharge status: Home / Self Care | Source: Ambulatory Visit | Attending: Radiation Oncology | Admitting: Radiation Oncology

## 2023-10-29 ENCOUNTER — Inpatient Hospital Stay

## 2023-10-29 ENCOUNTER — Other Ambulatory Visit: Payer: Self-pay

## 2023-10-29 DIAGNOSIS — C50512 Malignant neoplasm of lower-outer quadrant of left female breast: Secondary | ICD-10-CM | POA: Diagnosis not present

## 2023-10-29 LAB — RAD ONC ARIA SESSION SUMMARY
Course Elapsed Days: 20
Plan Fractions Treated to Date: 14
Plan Prescribed Dose Per Fraction: 2.66 Gy
Plan Total Fractions Prescribed: 16
Plan Total Prescribed Dose: 42.56 Gy
Reference Point Dosage Given to Date: 37.24 Gy
Reference Point Session Dosage Given: 2.66 Gy
Session Number: 14

## 2023-10-30 ENCOUNTER — Other Ambulatory Visit: Payer: Self-pay

## 2023-10-30 ENCOUNTER — Ambulatory Visit
Admission: RE | Admit: 2023-10-30 | Discharge: 2023-10-30 | Disposition: A | Discharge status: Home / Self Care | Source: Ambulatory Visit | Attending: Radiation Oncology | Admitting: Radiation Oncology

## 2023-10-30 DIAGNOSIS — C50512 Malignant neoplasm of lower-outer quadrant of left female breast: Secondary | ICD-10-CM | POA: Diagnosis not present

## 2023-10-30 LAB — RAD ONC ARIA SESSION SUMMARY
Course Elapsed Days: 21
Plan Fractions Treated to Date: 15
Plan Prescribed Dose Per Fraction: 2.66 Gy
Plan Total Fractions Prescribed: 16
Plan Total Prescribed Dose: 42.56 Gy
Reference Point Dosage Given to Date: 39.9 Gy
Reference Point Session Dosage Given: 2.66 Gy
Session Number: 15

## 2023-10-31 ENCOUNTER — Other Ambulatory Visit: Payer: Self-pay

## 2023-10-31 ENCOUNTER — Ambulatory Visit
Admission: RE | Admit: 2023-10-31 | Discharge: 2023-10-31 | Disposition: A | Discharge status: Home / Self Care | Source: Ambulatory Visit | Attending: Radiation Oncology | Admitting: Radiation Oncology

## 2023-10-31 ENCOUNTER — Other Ambulatory Visit

## 2023-10-31 DIAGNOSIS — Z171 Estrogen receptor negative status [ER-]: Secondary | ICD-10-CM

## 2023-10-31 DIAGNOSIS — C50512 Malignant neoplasm of lower-outer quadrant of left female breast: Secondary | ICD-10-CM | POA: Diagnosis not present

## 2023-10-31 LAB — RAD ONC ARIA SESSION SUMMARY
Course Elapsed Days: 22
Plan Fractions Treated to Date: 16
Plan Prescribed Dose Per Fraction: 2.66 Gy
Plan Total Fractions Prescribed: 16
Plan Total Prescribed Dose: 42.56 Gy
Reference Point Dosage Given to Date: 42.56 Gy
Reference Point Session Dosage Given: 2.66 Gy
Session Number: 16

## 2023-10-31 LAB — CBC (CANCER CENTER ONLY)
HCT: 32.6 % — ABNORMAL LOW (ref 36.0–46.0)
Hemoglobin: 10.2 g/dL — ABNORMAL LOW (ref 12.0–15.0)
MCH: 26.6 pg (ref 26.0–34.0)
MCHC: 31.3 g/dL (ref 30.0–36.0)
MCV: 84.9 fL (ref 80.0–100.0)
Platelet Count: 206 K/uL (ref 150–400)
RBC: 3.84 MIL/uL — ABNORMAL LOW (ref 3.87–5.11)
RDW: 13.5 % (ref 11.5–15.5)
WBC Count: 3.7 K/uL — ABNORMAL LOW (ref 4.0–10.5)
nRBC: 0 % (ref 0.0–0.2)

## 2023-11-03 ENCOUNTER — Encounter: Payer: Self-pay | Admitting: *Deleted

## 2023-11-04 NOTE — Radiation Completion Notes (Signed)
 Patient Name: Penny Gomez, Penny Gomez MRN: [id number] Date of Birth: 20-Dec-1985 Referring Physician: LAVENIA BEAVER, M.D. Date of Service: 2023-11-04 Radiation Oncologist: Marcey Penton, M.D. 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  Initially she had a 1.6 cm hypoechoic irregular mass of the left breast 8 o'clock position 6 cm from nipple.  There was a mildly thickened left axillary node which was biopsied and negative.  Patient underwent neoadjuvant chemotherapy with CarboTaxol and Keytruda  with MRI showing positive response to treatment.  She then underwent bilateral mastectomies with no evidence of residual disease in the left breast.  2 sentinel lymph nodes were both negative for malignancy.  She has expanders in place.  Her left breast implant flipped last week and was corrected by plastic surgeon t recently her right breast it was leaking somewhat she had her tissue expander replaced recently.  Patient is scheduled forDiep flaps in November seen here today to commence with her left whole breast radiation.  She is otherwise asymptomatic.      ==========DELIVERED PLANS==========  First Treatment Date: 2023-10-09 Last Treatment Date: 2023-10-31   Plan Name: Breast_L_BH Site: Breast, Left Technique: 3D Mode: Photon Dose Per Fraction: 2.66 Gy Prescribed Dose (Delivered / Prescribed): 42.56 Gy / 42.56 Gy Prescribed Fxs (Delivered / Prescribed): 16 / 16     ==========ON TREATMENT VISIT DATES========== 2023-10-14, 2023-10-21, 2023-10-28     ==========UPCOMING VISITS========== 12/12/2023 [facility]  MED ONC INFUSION [facility]- MO INFUSION CHAIR 2  12/12/2023 [facility] MED ONC EST PT Babara Call, MD  12/12/2023 [facility] MED ONC INF PORT FLUSH W/LAB [facility]-PORT FLUSH  12/10/2023 [facility] RAD ONCOLOGY FOLLOW UP 30 Penton Marcey, MD  11/21/2023 [facility] PLASTIC SURG SPEC POST OP Dillingham, Estefana RAMAN, DO  11/21/2023 [facility] MED ONC INFUSION [facility]- MO INFUSION CHAIR 3  11/21/2023 [facility] MED ONC EST PT Babara Call, MD  11/21/2023 [facility] MED ONC INF PORT FLUSH W/LAB [facility]-PORT FLUSH  11/19/2023 [facility] MED ONC REHAB [facility] BC Ancel Peters, OT        ==========APPENDIX - ON TREATMENT VISIT NOTES==========   See weekly On Treatment Notes in Epic for details in the Media tab (listed as Progress notes on the On Treatment Visit Dates listed above).

## 2023-11-17 ENCOUNTER — Ambulatory Visit: Admitting: Oncology

## 2023-11-17 ENCOUNTER — Other Ambulatory Visit

## 2023-11-17 ENCOUNTER — Ambulatory Visit

## 2023-11-19 ENCOUNTER — Inpatient Hospital Stay: Admitting: Occupational Therapy

## 2023-11-21 ENCOUNTER — Encounter: Admitting: Plastic Surgery

## 2023-11-21 ENCOUNTER — Inpatient Hospital Stay: Attending: Oncology

## 2023-11-21 ENCOUNTER — Inpatient Hospital Stay

## 2023-11-21 ENCOUNTER — Inpatient Hospital Stay (HOSPITAL_BASED_OUTPATIENT_CLINIC_OR_DEPARTMENT_OTHER): Admitting: Oncology

## 2023-11-21 ENCOUNTER — Encounter: Payer: Self-pay | Admitting: Oncology

## 2023-11-21 VITALS — BP 113/62 | HR 58 | Temp 97.4°F | Resp 18 | Ht 61.0 in | Wt 146.2 lb

## 2023-11-21 VITALS — BP 117/72 | HR 49

## 2023-11-21 DIAGNOSIS — C50919 Malignant neoplasm of unspecified site of unspecified female breast: Secondary | ICD-10-CM

## 2023-11-21 DIAGNOSIS — Z15068 Genetic susceptibility to other malignant neoplasm of digestive system: Secondary | ICD-10-CM

## 2023-11-21 DIAGNOSIS — Z801 Family history of malignant neoplasm of trachea, bronchus and lung: Secondary | ICD-10-CM | POA: Insufficient documentation

## 2023-11-21 DIAGNOSIS — Z86711 Personal history of pulmonary embolism: Secondary | ICD-10-CM | POA: Diagnosis not present

## 2023-11-21 DIAGNOSIS — Z5112 Encounter for antineoplastic immunotherapy: Secondary | ICD-10-CM | POA: Insufficient documentation

## 2023-11-21 DIAGNOSIS — Z1501 Genetic susceptibility to malignant neoplasm of breast: Secondary | ICD-10-CM | POA: Insufficient documentation

## 2023-11-21 DIAGNOSIS — C50312 Malignant neoplasm of lower-inner quadrant of left female breast: Secondary | ICD-10-CM | POA: Diagnosis present

## 2023-11-21 DIAGNOSIS — Z800 Family history of malignant neoplasm of digestive organs: Secondary | ICD-10-CM | POA: Insufficient documentation

## 2023-11-21 DIAGNOSIS — Z9013 Acquired absence of bilateral breasts and nipples: Secondary | ICD-10-CM | POA: Insufficient documentation

## 2023-11-21 DIAGNOSIS — Z1589 Genetic susceptibility to other disease: Secondary | ICD-10-CM

## 2023-11-21 DIAGNOSIS — D509 Iron deficiency anemia, unspecified: Secondary | ICD-10-CM | POA: Insufficient documentation

## 2023-11-21 DIAGNOSIS — Z1732 Human epidermal growth factor receptor 2 negative status: Secondary | ICD-10-CM | POA: Diagnosis not present

## 2023-11-21 DIAGNOSIS — Z1722 Progesterone receptor negative status: Secondary | ICD-10-CM | POA: Diagnosis not present

## 2023-11-21 DIAGNOSIS — Z15060 Genetic susceptibility to colorectal cancer: Secondary | ICD-10-CM

## 2023-11-21 DIAGNOSIS — Z171 Estrogen receptor negative status [ER-]: Secondary | ICD-10-CM | POA: Insufficient documentation

## 2023-11-21 DIAGNOSIS — Z7901 Long term (current) use of anticoagulants: Secondary | ICD-10-CM | POA: Diagnosis not present

## 2023-11-21 DIAGNOSIS — Z803 Family history of malignant neoplasm of breast: Secondary | ICD-10-CM | POA: Insufficient documentation

## 2023-11-21 DIAGNOSIS — Z1509 Genetic susceptibility to other malignant neoplasm: Secondary | ICD-10-CM

## 2023-11-21 LAB — CMP (CANCER CENTER ONLY)
ALT: 15 U/L (ref 0–44)
AST: 21 U/L (ref 15–41)
Albumin: 3.6 g/dL (ref 3.5–5.0)
Alkaline Phosphatase: 88 U/L (ref 38–126)
Anion gap: 6 (ref 5–15)
BUN: 8 mg/dL (ref 6–20)
CO2: 23 mmol/L (ref 22–32)
Calcium: 8.4 mg/dL — ABNORMAL LOW (ref 8.9–10.3)
Chloride: 106 mmol/L (ref 98–111)
Creatinine: 0.51 mg/dL (ref 0.44–1.00)
GFR, Estimated: >60 mL/min (ref >60)
Glucose, Bld: 93 mg/dL (ref 70–99)
Potassium: 3.7 mmol/L (ref 3.5–5.1)
Sodium: 135 mmol/L (ref 135–145)
Total Bilirubin: 0.4 mg/dL (ref 0.0–1.2)
Total Protein: 6.7 g/dL (ref 6.5–8.1)

## 2023-11-21 LAB — CBC WITH DIFFERENTIAL (CANCER CENTER ONLY)
Abs Immature Granulocytes: 0.02 K/uL (ref 0.00–0.07)
Basophils Absolute: 0 K/uL (ref 0.0–0.1)
Basophils Relative: 0 %
Eosinophils Absolute: 0 K/uL (ref 0.0–0.5)
Eosinophils Relative: 0 %
HCT: 32 % — ABNORMAL LOW (ref 36.0–46.0)
Hemoglobin: 10 g/dL — ABNORMAL LOW (ref 12.0–15.0)
Immature Granulocytes: 0 %
Lymphocytes Relative: 17 %
Lymphs Abs: 0.8 K/uL (ref 0.7–4.0)
MCH: 25.6 pg — ABNORMAL LOW (ref 26.0–34.0)
MCHC: 31.3 g/dL (ref 30.0–36.0)
MCV: 82.1 fL (ref 80.0–100.0)
Monocytes Absolute: 0.4 K/uL (ref 0.1–1.0)
Monocytes Relative: 7 %
Neutro Abs: 3.6 K/uL (ref 1.7–7.7)
Neutrophils Relative %: 76 %
Platelet Count: 232 K/uL (ref 150–400)
RBC: 3.9 MIL/uL (ref 3.87–5.11)
RDW: 14 % (ref 11.5–15.5)
WBC Count: 4.8 K/uL (ref 4.0–10.5)
nRBC: 0 % (ref 0.0–0.2)

## 2023-11-21 LAB — TSH: TSH: 1.163 u[IU]/mL (ref 0.350–4.500)

## 2023-11-21 MED ORDER — SODIUM CHLORIDE 0.9 % IV SOLN
INTRAVENOUS | Status: DC
  Filled 2023-11-21: qty 250

## 2023-11-21 MED ORDER — SODIUM CHLORIDE 0.9 % IV SOLN
200.0000 mg | Freq: Once | INTRAVENOUS | Status: AC
  Administered 2023-11-21: 200 mg via INTRAVENOUS
  Filled 2023-11-21: qty 8

## 2023-11-21 NOTE — Assessment & Plan Note (Addendum)
 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  S/p neoadjuvant chemotherapy with Carboplatin , taxol  Keytruda  Q3 weeks x 4 cycle  followed by [facility] + Keytrua with D4 GCSF x 4 cycles.  S/p bilateral mastectomy, no residual disease. --> reconstruction in progress.  Patient has BRCA 1 mutation, no residual disease, does not qualify Olarparib eligibility.  S/p adjuvant RT  Recommend adjuvant Keytrya every 3 weeks, plan for 9 cycles.  Labs are reviewed and discussed with patient. Proceed with cycle 2 Keytruda .

## 2023-11-21 NOTE — Assessment & Plan Note (Signed)
 Immunotherapy plan as listed above

## 2023-11-21 NOTE — Progress Notes (Signed)
 Hematology/Oncology Progress note [phone] [phone]       CHIEF COMPLAINTS/PURPOSE OF CONSULTATION:  Left triple negative breast cancer.   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  S/p neoadjuvant chemotherapy with Carboplatin , taxol  Keytruda  Q3 weeks x 4 cycle  followed by [facility] + Keytrua with D4 GCSF x 4 cycles.  S/p bilateral mastectomy, no residual disease. --> reconstruction in progress.  Patient has BRCA 1 mutation, no residual disease, does not qualify Olarparib eligibility.  S/p adjuvant RT  Recommend adjuvant Keytrya every 3 weeks, plan for 9 cycles.  Labs are reviewed and discussed with patient. Proceed with cycle 2 Keytruda .    Hypocalcemia Recommend OTC calcium  1200mg  and Vitamin D 1000 units supplementation  BRCA1 gene mutation positive Once she finished treatment of breast cancer, recommend risk-reducing salpingo oophorectomy- she will see Gyn today pancreatic screening at age 21 (or 40 years younger than the earliest exocrine pancreatic cancer diagnosis in the family, whichever is earlier)  Family members are encouraged to consider genetic testing for this familial pathogenic variant.   Encounter for antineoplastic immunotherapy Immunotherapy plan as listed above  Orders Placed This Encounter  Procedures   CBC with Differential (Cancer Center Only)    Standing Status:   Future    Expected Date:   01/02/2024    Expiration Date:   01/01/2025   CMP (Cancer Center only)    Standing Status:   Future    Expected Date:   01/02/2024    Expiration Date:   01/01/2025   CBC with Differential (Cancer Center Only)    Standing Status:   Future    Expected Date:   01/23/2024     Expiration Date:   01/22/2025   CMP (Cancer Center only)    Standing Status:   Future    Expected Date:   01/23/2024    Expiration Date:   01/22/2025   T4    Standing Status:   Future    Expected Date:   01/23/2024    Expiration Date:   01/22/2025   TSH    Standing Status:   Future    Expected Date:   01/23/2024    Expiration Date:   01/22/2025   CBC with Differential (Cancer Center Only)    Standing Status:   Future    Expected Date:   02/13/2024    Expiration Date:   02/12/2025   CMP (Cancer Center only)    Standing Status:   Future    Expected Date:   02/13/2024    Expiration Date:   02/12/2025   Follow up 3 weeks.  All questions were answered. The patient knows to call the clinic with any problems, questions or concerns.  Call Babara, MD, PhD [facility] Hematology Oncology 11/21/2023    HISTORY OF PRESENTING ILLNESS:  Penny Gomez 38 y.o. female presents to establish care for left triple negative breast cancer I have reviewed her chart and materials related to her cancer extensively and collaborated history with the patient. Summary of oncologic history is as follows: Oncology History  Invasive carcinoma of breast (HCC)  07/24/2022 Mammogram   She noticed left breast mass for 1 month.  Bilateral diagnostic mammogram showed Suspicious palpable left  breast mass 8 o'clock position. Cortically thickened left axillary lymph node   10/10/2022 Initial Diagnosis   Invasive carcinoma of breast (HCC)  10/02/2022  Left breast mass biopsy and left axillary lymph node biopsy.  Diagnosis 1. Breast, left, needle core biopsy, 8 o'clock 6 cmfn, heart clip - INVASIVE MAMMARY CARCINOMA, NO SPECIAL TYPE. - TUBULE FORMATION: SCORE 3 - NUCLEAR PLEOMORPHISM: SCORE 3 - MITOTIC COUNT: SCORE 3 - TOTAL SCORE: 9 - OVERALL GRADE: 3 - LYMPHOVASCULAR INVASION: NOT IDENTIFIED - CANCER LENGTH: 11 MM - CALCIFICATIONS: NOT IDENTIFIED - DUCTAL CARCINOMA IN SITU: PRESENT, HIGH-GRADE - ER-, PR- HER2 -  [IHC 1+] 2. Lymph node, needle/core biopsy, left axillary, hydromark - LYMPH NODE WITH REACTIVE CHANGES; NEGATIVE FOR MALIGNANCY.  Menarche at age of 11 First live birth at age of 20 OCP use: no History of hysterectomy: no Menopausal status: premenopausal History of HRT use: no History of chest radiation: no Number of previous breast biopsies:  right breast biopsy 05/20/2019 negative for malignancy Strong family history of cancer mother breast cancer diagnosed in 39s, maternal aunts x 2 breast cancer, maternal cousins breast cancer x 2, maternal grandmother pancreatic cancer.    10/10/2022 Cancer Staging   Staging form: Breast, AJCC 8th Edition - Clinical stage from 10/10/2022: Stage IIIB (cT3, cN0, cM0, G3, ER-, PR-, HER2-) - Signed by Babara Call, MD on 10/18/2022 Stage prefix: Initial diagnosis Nuclear grade: G3 Histologic grading system: 3 grade system   10/17/2022 Imaging   Bilateral MRI breasts w wo contrast  1. In total, there is approximately 6.7 cm of abnormal enhancement, predominantly in the lower-inner inferior left breast, with the ultrasound-guided biopsy marking clip centrally positioned within the enhancement. There are scattered small enhancing masses in the lower inner and lower outer anterior left (included in the measurement above).   2.  No evidence of right breast malignancy.   3. The axillary lymph nodes are not included within the field of view of this MRI for adequate assessment of lymphadenopathy.    10/17/2022 Procedure   S/p medi port placement.    10/25/2022 - 10/25/2022 Chemotherapy   Patient is on Treatment Plan : BREAST ADJUVANT DOSE DENSE AC q14d / PACLitaxel  q7d     10/25/2022 - 05/19/2023 Chemotherapy   Patient is on Treatment Plan : BREAST Pembrolizumab  (200) D1 + Carboplatin  (5) D1 + Paclitaxel  (80) D1,8,15 q21d X 4 cycles / Pembrolizumab  (200) D1 + AC D1 q21d x 4 cycles      Genetic Testing   Single pathogenic variant in BRCA1 called c.190T>G identified on  the [facility] Multi-Cancer+RNA panel. The remainder of testing was normal. The report date is 11/11/2022.  The Multi-Cancer + RNA Panel offered by [facility] includes sequencing and/or deletion/duplication analysis of the following 70 genes:  AIP*, ALK, APC*, ATM*, AXIN2*, BAP1*, BARD1*, BLM*, BMPR1A*, BRCA1*, BRCA2*, BRIP1*, CDC73*, CDH1*, CDK4, CDKN1B*, CDKN2A, CHEK2*, CTNNA1*, DICER1*, EPCAM, EGFR, FH*, FLCN*, GREM1, HOXB13, KIT, LZTR1, MAX*, MBD4, MEN1*, MET, MITF, MLH1*, MSH2*, MSH3*, MSH6*, MUTYH*, NF1*, NF2*, NTHL1*, PALB2*, PDGFRA, PMS2*, POLD1*, POLE*, POT1*, PRKAR1A*, PTCH1*, PTEN*, RAD51C*, RAD51D*, RB1*, RET, SDHA*, SDHAF2*, SDHB*, SDHC*, SDHD*, SMAD4*, SMARCA4*, SMARCB1*, SMARCE1*, STK11*, SUFU*, TMEM127*, TP53*, TSC1*, TSC2*, VHL*. RNA analysis is performed for * genes.   11/24/2022 - 11/25/2022 Hospital Admission   Admitted due to acute pulmonary embolism, lower extremity US  negative for DVT.  Patietn was started on heparin  gtt and transitioned to Eliquis  at discharge.    02/10/2023 Procedure   S/p port placement.    03/10/2023  Imaging   MRI bilateral breast w wo contrast  No abnormal enhancement in the left breast consistent with response to neoadjuvant chemotherapy.   07/24/2023 Surgery   Patient underwent bilateral mastectomy 1. Breast, simple mastectomy, left, long stitch lateral, short stitch superior :      - CHANGES CONSISTENT WITH NEOADJUVANT THERAPY, NEGATIVE FOR RESIDUAL MALIGNANCY.      - SEE NOTE.      - SCLEROSING ADENOSIS AND SECRETORY CHANGE.      - BIOPSY SITE CHANGE WITH CLIP.      - UNREMARKABLE NIPPLE AND SKIN.       2. Breast, simple mastectomy, right, long stitch lateral, short stitch superior :      - SCLEROSING ADENOSIS, FIBROUS STROMA, AND FIBROADENOMATOID CHANGE.      - UNREMARKABLE NIPPLE AND SKIN.      - NEGATIVE FOR ATYPIA AND MALIGNANCY.       3. Lymph node, sentinel, biopsy, left axillary #1 :      - TWO LYMPH NODES NEGATIVE FOR MALIGNANCY (0/2).       - SEE NOTE.      - BIOPSY SITE CHANGE INVOLVING ONE LYMPH NODE.      - FIBROUS SCARRING / CHANGES SUGGESTIVE OF NEOADJUVANT THERAPY ARE NOT      IDENTIFIED.       4. Breast, excision, left lateral margin :      - UNREMARKABLE BREAST TISSUE.      - NEGATIVE FOR ATYPIA AND MALIGNANCY.    08/22/2023 -  Chemotherapy   Patient is on Treatment Plan : BREAST Pembrolizumab  (200) q21d x 27 weeks      Today she presents for evaluation prior to immunotherapy.  S/p bilateral mastectomy, ongoing plastic surgery  for reconstruction. S/p post mastectomy radiation.    MEDICAL HISTORY:  Past Medical History:  Diagnosis Date   Acute pulmonary embolism (HCC)    BRCA1 gene mutation positive    Breast lump    Chronic anticoagulation    Endocarditis    Fibroid, uterine    Immunosuppressed due to chemotherapy    Microcytic anemia    MSSA bacteremia    Multifocal pneumonia    Sepsis (HCC)    due to port a cath   STD (sexually transmitted disease)    From Medical Hx;   Thrombocytopenia    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   Vascular port complication     SURGICAL HISTORY: Past Surgical History:  Procedure Laterality Date   BREAST BIOPSY Right 05/20/2019   Affirm Bx- X- clip, neg   BREAST BIOPSY Left 10/02/2022   US  Bx, path pending   BREAST BIOPSY Left 10/02/2022   Us  Bx Node- path pending   BREAST BIOPSY Left 10/02/2022   US  LT BREAST BX W LOC DEV 1ST LESION IMG BX SPEC US  GUIDE 10/02/2022 [facility]   BREAST RECONSTRUCTION Bilateral 07/24/2023   Procedure: RECONSTRUCTION, BREAST;  Surgeon: Lowery Estefana RAMAN, DO;  Location: [facility];  Service: Plastics;  Laterality: Bilateral;  BILATERAL IMMEDIATE BREAST RECONSTRUCTION WITH EXPANDER AND FLEX HD PLACEMENT   CESAREAN SECTION     x 2   LAPAROSCOPIC BILATERAL SALPINGECTOMY Bilateral 02/09/2022   Procedure: LAPAROSCOPIC BILATERAL SALPINGECTOMY;  Surgeon: Connell Davies, MD;  Location: [facility];  Service: Gynecology;   Laterality: Bilateral;   MASTECTOMY W/ SENTINEL NODE BIOPSY Bilateral 07/24/2023   Procedure: MASTECTOMY WITH SENTINEL LYMPH NODE BIOPSY;  Surgeon: Tye Millet, DO;  Location: [facility];  Service: General;  Laterality: Bilateral;  SN biopsy left only   PORTACATH PLACEMENT Right 10/17/2022   Procedure: INSERTION PORT-A-CATH;  Surgeon: Desiderio Schanz, MD;  Location: [facility];  Service: General;  Laterality: Right;   PORTACATH PLACEMENT Left 02/10/2023   Procedure: INSERTION PORT-A-CATH;  Surgeon: Tye Millet, DO;  Location: [facility];  Service: General;  Laterality: Left;   REMOVAL OF BILATERAL TISSUE EXPANDERS WITH PLACEMENT OF BILATERAL BREAST IMPLANTS Bilateral 09/11/2023   Procedure: REMOVAL, TISSUE EXPANDER, BREAST, BILATERAL, WITH BILATERAL IMPLANT IMPLANT INSERTION;  Surgeon: Lowery Estefana RAMAN, DO;  Location: [facility];  Service: Plastics;  Laterality: Bilateral;  right expander removal and replacement.   TEE WITHOUT CARDIOVERSION N/A 01/02/2023   Procedure: TRANSESOPHAGEAL ECHOCARDIOGRAM (TEE);  Surgeon: Perla Evalene PARAS, MD;  Location: [facility];  Service: Cardiovascular;  Laterality: N/A;   TUBAL LIGATION      SOCIAL HISTORY: Social History   Socioeconomic History   Marital status: Single    Spouse name: Not on file   Number of children: 2   Years of education: Not on file   Highest education level: High school graduate  Occupational History   Not on file  Tobacco Use   Smoking status: Never    Passive exposure: Never   Smokeless tobacco: Never  Vaping Use   Vaping status: Never Used  Substance and Sexual Activity   Alcohol use: Never   Drug use: No   Sexual activity: Yes    Birth control/protection: Surgical  Other Topics Concern   Not on file  Social History Narrative   Lives with mom and 2 kids   Social Drivers of Health   Financial Resource Strain: Low Risk  (10/20/2023)   Received from [facility]   Overall Financial Resource  Strain (CARDIA)    Difficulty of Paying Living Expenses: Not hard at all  Food Insecurity: No Food Insecurity (10/20/2023)   Received from [facility]   Hunger Vital Sign    Within the past 12 months, you worried that your food would run out before you got the money to buy more.: Never true    Within the past 12 months, the food you bought just didn't last and you didn't have money to get more.: Never true  Transportation Needs: No Transportation Needs (10/20/2023)   Received from [facility] - Transportation    In the past 12 months, has lack of transportation kept you from medical appointments or from getting medications?: No    Lack of Transportation (Non-Medical): No  Physical Activity: Not on file  Stress: No Stress Concern Present (10/10/2022)   [employer] of [employer] - Occupational Stress Questionnaire    Feeling of Stress : Only a little  Social Connections: Not on file  Intimate Partner Violence: Not At Risk (12/31/2022)   Humiliation, Afraid, Rape, and Kick questionnaire    Fear of Current or Ex-Partner: No    Emotionally Abused: No    Physically Abused: No    Sexually Abused: No    FAMILY HISTORY: Family History  Problem Relation Age of Onset   Breast cancer Mother 12       no GT   Breast cancer Maternal Aunt 33   Breast cancer Maternal Aunt    Lung cancer Maternal Uncle    Lung cancer Maternal Uncle    Bone cancer Paternal Aunt    Pancreatic cancer Maternal Grandmother 44   Lung cancer Maternal Grandfather 83  Breast cancer Cousin 40   Breast cancer Other 33       gene pos?    ALLERGIES:  has no known allergies.  MEDICATIONS:  Current Outpatient Medications  Medication Sig Dispense Refill   senna (SENOKOT) 8.6 MG TABS tablet Take 2 tablets (17.2 mg total) by mouth daily. 120 tablet 2   tranexamic acid (LYSTEDA) 650 MG TABS tablet Take 1,300 mg by mouth. Take 2 tablets (1,300 mg total) by mouth 3  (three) times daily Take for a maximum of 4-5 days during monthly menstruation.     apixaban  (ELIQUIS ) 5 MG TABS tablet Take 1 tablet (5 mg total) by mouth 2 (two) times daily. (Patient not taking: Reported on 11/21/2023) 90 tablet 0   diazepam  (VALIUM ) 2 MG tablet Take 1 tablet (2 mg total) by mouth every 12 (twelve) hours as needed for up to 20 doses for muscle spasms. (Patient not taking: Reported on 11/21/2023) 20 tablet 0   No current facility-administered medications for this visit.   Facility-Administered Medications Ordered in Other Visits  Medication Dose Route Frequency Provider Last Rate Last Admin   0.9 %  sodium chloride  infusion   Intravenous Continuous Babara Call, MD 10 mL/hr at 11/21/23 0904 New Bag at 11/21/23 0904   pembrolizumab  (KEYTRUDA ) 200 mg in sodium chloride  0.9 % 50 mL chemo infusion  200 mg Intravenous Once Babara Call, MD        Review of Systems  Constitutional:  Negative for appetite change, chills, fatigue and fever.  HENT:   Negative for hearing loss and voice change.   Eyes:  Negative for eye problems.  Respiratory:  Negative for chest tightness and cough.   Cardiovascular:  Negative for chest pain.  Gastrointestinal:  Negative for abdominal distention, abdominal pain and blood in stool.  Endocrine: Negative for hot flashes.  Genitourinary:  Negative for difficulty urinating and frequency.   Musculoskeletal:  Negative for arthralgias.  Skin:  Negative for itching and rash.  Neurological:  Negative for extremity weakness and headaches.  Hematological:  Negative for adenopathy.  Psychiatric/Behavioral:  Negative for confusion.      PHYSICAL EXAMINATION: ECOG PERFORMANCE STATUS: 0 - Asymptomatic  Vitals:   11/21/23 0842  BP: 113/62  Pulse: (!) 58  Resp: 18  Temp: (!) 97.4 F (36.3 C)   Filed Weights   11/21/23 0842  Weight: 146 lb 3.2 oz (66.3 kg)    Physical Exam Constitutional:      General: She is not in acute distress.    Appearance: She  is not diaphoretic.  HENT:     Head: Normocephalic and atraumatic.  Eyes:     General: No scleral icterus. Cardiovascular:     Rate and Rhythm: Normal rate and regular rhythm.  Pulmonary:     Effort: Pulmonary effort is normal. No respiratory distress.     Breath sounds: No wheezing.  Abdominal:     General: Bowel sounds are normal. There is no distension.     Palpations: Abdomen is soft.  Musculoskeletal:        General: Normal range of motion.     Cervical back: Normal range of motion and neck supple.  Skin:    General: Skin is warm and dry.     Findings: No erythema.     Comments: Left anterior chest wall medi port  Neurological:     Mental Status: She is alert and oriented to person, place, and time. Mental status is at baseline.  Motor: No abnormal muscle tone.  Psychiatric:        Mood and Affect: Mood and affect normal.     LABORATORY DATA:  I have reviewed the data as listed    Latest Ref Rng & Units 11/21/2023    8:08 AM 10/31/2023   11:25 AM 10/15/2023    2:27 PM  CBC  WBC 4.0 - 10.5 K/uL 4.8  3.7  8.5   Hemoglobin 12.0 - 15.0 g/dL 89.9  89.7  88.6   Hematocrit 36.0 - 46.0 % 32.0  32.6  34.8   Platelets 150 - 400 K/uL 232  206  255       Latest Ref Rng & Units 11/21/2023    8:08 AM 08/22/2023    8:10 AM 08/17/2023   12:19 AM  CMP  Glucose 70 - 99 mg/dL 93  98  98   BUN 6 - 20 mg/dL 8  8  13    Creatinine 0.44 - 1.00 mg/dL 9.48  9.27  9.34   Sodium 135 - 145 mmol/L 135  137  137   Potassium 3.5 - 5.1 mmol/L 3.7  3.9  3.9   Chloride 98 - 111 mmol/L 106  107  106   CO2 22 - 32 mmol/L 23  24  24    Calcium  8.9 - 10.3 mg/dL 8.4  8.6  8.7   Total Protein 6.5 - 8.1 g/dL 6.7  6.5  6.9   Total Bilirubin 0.0 - 1.2 mg/dL 0.4  0.4  0.3   Alkaline Phos 38 - 126 U/L 88  90  90   AST 15 - 41 U/L 21  27  32   ALT 0 - 44 U/L 15  24  30       RADIOGRAPHIC STUDIES: I have personally reviewed the radiological images as listed and agreed with the findings in the  report. No results found.

## 2023-11-21 NOTE — Assessment & Plan Note (Addendum)
 Recommend OTC calcium  1200mg  and Vitamin D 1000 units supplementation

## 2023-11-21 NOTE — Assessment & Plan Note (Addendum)
 Once she finished treatment of breast cancer, recommend risk-reducing salpingo oophorectomy- she will see Gyn today pancreatic screening at age 39 (or 35 years younger than the earliest exocrine pancreatic cancer diagnosis in the family, whichever is earlier)  Family members are encouraged to consider genetic testing for this familial pathogenic variant.

## 2023-11-21 NOTE — Patient Instructions (Signed)

## 2023-11-22 LAB — T4: T4, Total: 8 ug/dL (ref 4.5–12.0)

## 2023-11-23 ENCOUNTER — Other Ambulatory Visit: Payer: Self-pay

## 2023-12-08 ENCOUNTER — Other Ambulatory Visit

## 2023-12-08 ENCOUNTER — Ambulatory Visit

## 2023-12-08 ENCOUNTER — Ambulatory Visit: Admitting: Oncology

## 2023-12-09 ENCOUNTER — Other Ambulatory Visit: Payer: Self-pay

## 2023-12-10 ENCOUNTER — Ambulatory Visit: Admitting: Radiation Oncology

## 2023-12-12 ENCOUNTER — Encounter: Payer: Self-pay | Admitting: Oncology

## 2023-12-12 ENCOUNTER — Inpatient Hospital Stay

## 2023-12-12 ENCOUNTER — Other Ambulatory Visit: Payer: Self-pay

## 2023-12-12 ENCOUNTER — Inpatient Hospital Stay: Admitting: Oncology

## 2023-12-12 VITALS — BP 114/59 | HR 57 | Temp 96.0°F | Resp 19

## 2023-12-12 VITALS — BP 123/74 | HR 58 | Temp 96.0°F | Resp 18 | Wt 148.4 lb

## 2023-12-12 DIAGNOSIS — D500 Iron deficiency anemia secondary to blood loss (chronic): Secondary | ICD-10-CM

## 2023-12-12 DIAGNOSIS — C50919 Malignant neoplasm of unspecified site of unspecified female breast: Secondary | ICD-10-CM | POA: Diagnosis not present

## 2023-12-12 DIAGNOSIS — Z5112 Encounter for antineoplastic immunotherapy: Secondary | ICD-10-CM | POA: Diagnosis not present

## 2023-12-12 DIAGNOSIS — Z1501 Genetic susceptibility to malignant neoplasm of breast: Secondary | ICD-10-CM

## 2023-12-12 DIAGNOSIS — Z1589 Genetic susceptibility to other disease: Secondary | ICD-10-CM

## 2023-12-12 DIAGNOSIS — Z1509 Genetic susceptibility to other malignant neoplasm: Secondary | ICD-10-CM

## 2023-12-12 DIAGNOSIS — Z15060 Genetic susceptibility to colorectal cancer: Secondary | ICD-10-CM

## 2023-12-12 DIAGNOSIS — Z15068 Genetic susceptibility to other malignant neoplasm of digestive system: Secondary | ICD-10-CM

## 2023-12-12 LAB — CBC WITH DIFFERENTIAL (CANCER CENTER ONLY)
Abs Immature Granulocytes: 0.01 K/uL (ref 0.00–0.07)
Basophils Absolute: 0 K/uL (ref 0.0–0.1)
Basophils Relative: 0 %
Eosinophils Absolute: 0 K/uL (ref 0.0–0.5)
Eosinophils Relative: 0 %
HCT: 32.4 % — ABNORMAL LOW (ref 36.0–46.0)
Hemoglobin: 10 g/dL — ABNORMAL LOW (ref 12.0–15.0)
Immature Granulocytes: 0 %
Lymphocytes Relative: 18 %
Lymphs Abs: 0.9 K/uL (ref 0.7–4.0)
MCH: 24.9 pg — ABNORMAL LOW (ref 26.0–34.0)
MCHC: 30.9 g/dL (ref 30.0–36.0)
MCV: 80.8 fL (ref 80.0–100.0)
Monocytes Absolute: 0.4 K/uL (ref 0.1–1.0)
Monocytes Relative: 7 %
Neutro Abs: 3.7 K/uL (ref 1.7–7.7)
Neutrophils Relative %: 75 %
Platelet Count: 215 K/uL (ref 150–400)
RBC: 4.01 MIL/uL (ref 3.87–5.11)
RDW: 14.2 % (ref 11.5–15.5)
WBC Count: 5 K/uL (ref 4.0–10.5)
nRBC: 0 % (ref 0.0–0.2)

## 2023-12-12 LAB — RETIC PANEL
Immature Retic Fract: 13.7 % (ref 2.3–15.9)
RBC.: 3.95 MIL/uL (ref 3.87–5.11)
Retic Count, Absolute: 45.4 K/uL (ref 19.0–186.0)
Retic Ct Pct: 1.2 % (ref 0.4–3.1)
Reticulocyte Hemoglobin: 23.2 pg — ABNORMAL LOW (ref >27.9)

## 2023-12-12 LAB — CMP (CANCER CENTER ONLY)
ALT: 17 U/L (ref 0–44)
AST: 23 U/L (ref 15–41)
Albumin: 3.4 g/dL — ABNORMAL LOW (ref 3.5–5.0)
Alkaline Phosphatase: 80 U/L (ref 38–126)
Anion gap: 8 (ref 5–15)
BUN: 12 mg/dL (ref 6–20)
CO2: 21 mmol/L — ABNORMAL LOW (ref 22–32)
Calcium: 8.7 mg/dL — ABNORMAL LOW (ref 8.9–10.3)
Chloride: 108 mmol/L (ref 98–111)
Creatinine: 0.63 mg/dL (ref 0.44–1.00)
GFR, Estimated: >60 mL/min (ref >60)
Glucose, Bld: 102 mg/dL — ABNORMAL HIGH (ref 70–99)
Potassium: 4 mmol/L (ref 3.5–5.1)
Sodium: 137 mmol/L (ref 135–145)
Total Bilirubin: 0.6 mg/dL (ref 0.0–1.2)
Total Protein: 6.7 g/dL (ref 6.5–8.1)

## 2023-12-12 LAB — IRON AND TIBC
Iron: 26 ug/dL — ABNORMAL LOW (ref 28–170)
Saturation Ratios: 6 % — ABNORMAL LOW (ref 10.4–31.8)
TIBC: 417 ug/dL (ref 250–450)
UIBC: 391 ug/dL

## 2023-12-12 LAB — FERRITIN: Ferritin: 5 ng/mL — ABNORMAL LOW (ref 11–307)

## 2023-12-12 MED ORDER — SODIUM CHLORIDE 0.9 % IV SOLN
INTRAVENOUS | Status: DC
  Filled 2023-12-12: qty 250

## 2023-12-12 MED ORDER — SODIUM CHLORIDE 0.9 % IV SOLN
200.0000 mg | Freq: Once | INTRAVENOUS | Status: AC
  Administered 2023-12-12: 200 mg via INTRAVENOUS
  Filled 2023-12-12: qty 8

## 2023-12-12 NOTE — Progress Notes (Signed)
 Hematology/Oncology Progress note [phone] [phone]       CHIEF COMPLAINTS/PURPOSE OF CONSULTATION:  Left triple negative breast cancer.   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  S/p neoadjuvant chemotherapy with Carboplatin , taxol  Keytruda  Q3 weeks x 4 cycle  followed by [facility] + Keytrua with D4 GCSF x 4 cycles.  S/p bilateral mastectomy, no residual disease. --> reconstruction in progress.  Patient has BRCA 1 mutation, no residual disease, does not qualify Olarparib eligibility.  S/p adjuvant RT  Recommend adjuvant Keytrya every 3 weeks, plan for 9 cycles.  Labs are reviewed and discussed with patient. Proceed with cycle 3 Keytruda .    BRCA1 gene mutation positive Once she finished treatment of breast cancer, recommend risk-reducing salpingo oophorectomy- planned in December 2025 pancreatic screening at age 54 (or 10 years younger than the earliest exocrine pancreatic cancer diagnosis in the family, whichever is earlier)  Family members are encouraged to consider genetic testing for this familial pathogenic variant.   Encounter for antineoplastic immunotherapy Immunotherapy plan as listed above  Hypocalcemia Recommend OTC calcium  1200mg  and Vitamin D 1000 units supplementation  IDA (iron  deficiency anemia) Labs are reviewed and discussed with patient. Lab Results  Component Value Date   HGB 10.0 (L) 12/12/2023   TIBC 377 02/28/2023   IRONPCTSAT 11 02/28/2023   FERRITIN 23 02/28/2023   Hemoglobin is decreased. Check iron  panel  Orders Placed This Encounter  Procedures   Ferritin    Standing Status:   Future    Number of Occurrences:   1    Expected Date:   12/12/2023     Expiration Date:   03/11/2024   Iron  and TIBC    Standing Status:   Future    Number of Occurrences:   1    Expected Date:   12/12/2023    Expiration Date:   03/11/2024   Retic Panel    Standing Status:   Future    Number of Occurrences:   1    Expected Date:   12/12/2023    Expiration Date:   03/11/2024   Follow up 3 weeks.  All questions were answered. The patient knows to call the clinic with any problems, questions or concerns.  Call Babara, MD, PhD [facility] Hematology Oncology 12/12/2023    HISTORY OF PRESENTING ILLNESS:  Penny Gomez 38 y.o. female presents to establish care for left triple negative breast cancer I have reviewed her chart and materials related to her cancer extensively and collaborated history with the patient. Summary of oncologic history is as follows: Oncology History  Invasive carcinoma of breast (HCC)  07/24/2022 Mammogram   She noticed left breast mass for 1 month.  Bilateral diagnostic mammogram showed Suspicious palpable left breast mass 8 o'clock position. Cortically thickened left axillary lymph node   10/10/2022 Initial Diagnosis   Invasive carcinoma of breast (HCC)  10/02/2022  Left breast mass biopsy and left axillary lymph node biopsy.  Diagnosis 1. Breast, left, needle core biopsy, 8 o'clock 6 cmfn, heart clip - INVASIVE MAMMARY CARCINOMA, NO SPECIAL TYPE. - TUBULE FORMATION: SCORE 3 - NUCLEAR PLEOMORPHISM: SCORE 3 - MITOTIC COUNT: SCORE 3 - TOTAL SCORE: 9 - OVERALL GRADE: 3 - LYMPHOVASCULAR INVASION:  NOT IDENTIFIED - CANCER LENGTH: 11 MM - CALCIFICATIONS: NOT IDENTIFIED - DUCTAL CARCINOMA IN SITU: PRESENT, HIGH-GRADE - ER-, PR- HER2 - [IHC 1+] 2. Lymph node, needle/core biopsy, left axillary, hydromark - LYMPH NODE WITH REACTIVE CHANGES; NEGATIVE FOR MALIGNANCY.  Menarche at age of 18 First live birth at age of 42 OCP use: no History of hysterectomy: no Menopausal status: premenopausal History of HRT use: no History of chest  radiation: no Number of previous breast biopsies:  right breast biopsy 05/20/2019 negative for malignancy Strong family history of cancer mother breast cancer diagnosed in 44s, maternal aunts x 2 breast cancer, maternal cousins breast cancer x 2, maternal grandmother pancreatic cancer.    10/10/2022 Cancer Staging   Staging form: Breast, AJCC 8th Edition - Clinical stage from 10/10/2022: Stage IIIB (cT3, cN0, cM0, G3, ER-, PR-, HER2-) - Signed by Babara Call, MD on 10/18/2022 Stage prefix: Initial diagnosis Nuclear grade: G3 Histologic grading system: 3 grade system   10/17/2022 Imaging   Bilateral MRI breasts w wo contrast  1. In total, there is approximately 6.7 cm of abnormal enhancement, predominantly in the lower-inner inferior left breast, with the ultrasound-guided biopsy marking clip centrally positioned within the enhancement. There are scattered small enhancing masses in the lower inner and lower outer anterior left (included in the measurement above).   2.  No evidence of right breast malignancy.   3. The axillary lymph nodes are not included within the field of view of this MRI for adequate assessment of lymphadenopathy.    10/17/2022 Procedure   S/p medi port placement.    10/25/2022 - 10/25/2022 Chemotherapy   Patient is on Treatment Plan : BREAST ADJUVANT DOSE DENSE AC q14d / PACLitaxel  q7d     10/25/2022 - 05/19/2023 Chemotherapy   Patient is on Treatment Plan : BREAST Pembrolizumab  (200) D1 + Carboplatin  (5) D1 + Paclitaxel  (80) D1,8,15 q21d X 4 cycles / Pembrolizumab  (200) D1 + AC D1 q21d x 4 cycles      Genetic Testing   Single pathogenic variant in BRCA1 called c.190T>G identified on the [facility] Multi-Cancer+RNA panel. The remainder of testing was normal. The report date is 11/11/2022.  The Multi-Cancer + RNA Panel offered by [facility] includes sequencing and/or deletion/duplication analysis of the following 70 genes:  AIP*, ALK, APC*, ATM*, AXIN2*, BAP1*, BARD1*, BLM*, BMPR1A*,  BRCA1*, BRCA2*, BRIP1*, CDC73*, CDH1*, CDK4, CDKN1B*, CDKN2A, CHEK2*, CTNNA1*, DICER1*, EPCAM, EGFR, FH*, FLCN*, GREM1, HOXB13, KIT, LZTR1, MAX*, MBD4, MEN1*, MET, MITF, MLH1*, MSH2*, MSH3*, MSH6*, MUTYH*, NF1*, NF2*, NTHL1*, PALB2*, PDGFRA, PMS2*, POLD1*, POLE*, POT1*, PRKAR1A*, PTCH1*, PTEN*, RAD51C*, RAD51D*, RB1*, RET, SDHA*, SDHAF2*, SDHB*, SDHC*, SDHD*, SMAD4*, SMARCA4*, SMARCB1*, SMARCE1*, STK11*, SUFU*, TMEM127*, TP53*, TSC1*, TSC2*, VHL*. RNA analysis is performed for * genes.   11/24/2022 - 11/25/2022 Hospital Admission   Admitted due to acute pulmonary embolism, lower extremity US  negative for DVT.  Patietn was started on heparin  gtt and transitioned to Eliquis  at discharge.    02/10/2023 Procedure   S/p port placement.    03/10/2023 Imaging   MRI bilateral breast w wo contrast  No abnormal enhancement in the left breast consistent with response to neoadjuvant chemotherapy.   07/24/2023 Surgery   Patient underwent bilateral mastectomy 1. Breast, simple mastectomy, left, long stitch lateral, short stitch superior :      - CHANGES CONSISTENT WITH NEOADJUVANT THERAPY, NEGATIVE FOR RESIDUAL MALIGNANCY.      - SEE NOTE.      - SCLEROSING ADENOSIS AND SECRETORY CHANGE.      -  BIOPSY SITE CHANGE WITH CLIP.      - UNREMARKABLE NIPPLE AND SKIN.       2. Breast, simple mastectomy, right, long stitch lateral, short stitch superior :      - SCLEROSING ADENOSIS, FIBROUS STROMA, AND FIBROADENOMATOID CHANGE.      - UNREMARKABLE NIPPLE AND SKIN.      - NEGATIVE FOR ATYPIA AND MALIGNANCY.       3. Lymph node, sentinel, biopsy, left axillary #1 :      - TWO LYMPH NODES NEGATIVE FOR MALIGNANCY (0/2).      - SEE NOTE.      - BIOPSY SITE CHANGE INVOLVING ONE LYMPH NODE.      - FIBROUS SCARRING / CHANGES SUGGESTIVE OF NEOADJUVANT THERAPY ARE NOT      IDENTIFIED.       4. Breast, excision, left lateral margin :      - UNREMARKABLE BREAST TISSUE.      - NEGATIVE FOR ATYPIA AND MALIGNANCY.     08/22/2023 -  Chemotherapy   Patient is on Treatment Plan : BREAST Pembrolizumab  (200) q21d x 27 weeks      Today she presents for evaluation prior to immunotherapy.  S/p bilateral mastectomy, ongoing plastic surgery  for reconstruction. S/p post mastectomy radiation.    MEDICAL HISTORY:  Past Medical History:  Diagnosis Date   Acute pulmonary embolism (HCC)    BRCA1 gene mutation positive    Breast lump    Chronic anticoagulation    Endocarditis    Fibroid, uterine    Immunosuppressed due to chemotherapy    Microcytic anemia    MSSA bacteremia    Multifocal pneumonia    Sepsis (HCC)    due to port a cath   STD (sexually transmitted disease)    From Medical Hx;   Thrombocytopenia    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   Vascular port complication     SURGICAL HISTORY: Past Surgical History:  Procedure Laterality Date   BREAST BIOPSY Right 05/20/2019   Affirm Bx- X- clip, neg   BREAST BIOPSY Left 10/02/2022   US  Bx, path pending   BREAST BIOPSY Left 10/02/2022   Us  Bx Node- path pending   BREAST BIOPSY Left 10/02/2022   US  LT BREAST BX W LOC DEV 1ST LESION IMG BX SPEC US  GUIDE 10/02/2022 [facility]   BREAST RECONSTRUCTION Bilateral 07/24/2023   Procedure: RECONSTRUCTION, BREAST;  Surgeon: Lowery Estefana RAMAN, DO;  Location: [facility];  Service: Plastics;  Laterality: Bilateral;  BILATERAL IMMEDIATE BREAST RECONSTRUCTION WITH EXPANDER AND FLEX HD PLACEMENT   CESAREAN SECTION     x 2   LAPAROSCOPIC BILATERAL SALPINGECTOMY Bilateral 02/09/2022   Procedure: LAPAROSCOPIC BILATERAL SALPINGECTOMY;  Surgeon: Connell Davies, MD;  Location: [facility];  Service: Gynecology;  Laterality: Bilateral;   MASTECTOMY W/ SENTINEL NODE BIOPSY Bilateral 07/24/2023   Procedure: MASTECTOMY WITH SENTINEL LYMPH NODE BIOPSY;  Surgeon: Tye Millet, DO;  Location: [facility];  Service: General;  Laterality: Bilateral;  SN biopsy left only   PORTACATH PLACEMENT Right 10/17/2022    Procedure: INSERTION PORT-A-CATH;  Surgeon: Desiderio Schanz, MD;  Location: [facility];  Service: General;  Laterality: Right;   PORTACATH PLACEMENT Left 02/10/2023   Procedure: INSERTION PORT-A-CATH;  Surgeon: Tye Millet, DO;  Location: [facility];  Service: General;  Laterality: Left;   REMOVAL OF BILATERAL TISSUE EXPANDERS WITH PLACEMENT OF BILATERAL BREAST IMPLANTS Bilateral 09/11/2023   Procedure: REMOVAL, TISSUE EXPANDER, BREAST, BILATERAL, WITH BILATERAL IMPLANT  IMPLANT INSERTION;  Surgeon: Lowery Estefana RAMAN, DO;  Location: [facility];  Service: Plastics;  Laterality: Bilateral;  right expander removal and replacement.   TEE WITHOUT CARDIOVERSION N/A 01/02/2023   Procedure: TRANSESOPHAGEAL ECHOCARDIOGRAM (TEE);  Surgeon: Perla Evalene PARAS, MD;  Location: [facility];  Service: Cardiovascular;  Laterality: N/A;   TUBAL LIGATION      SOCIAL HISTORY: Social History   Socioeconomic History   Marital status: Single    Spouse name: Not on file   Number of children: 2   Years of education: Not on file   Highest education level: High school graduate  Occupational History   Not on file  Tobacco Use   Smoking status: Never    Passive exposure: Never   Smokeless tobacco: Never  Vaping Use   Vaping status: Never Used  Substance and Sexual Activity   Alcohol use: Never   Drug use: No   Sexual activity: Yes    Birth control/protection: Surgical  Other Topics Concern   Not on file  Social History Narrative   Lives with mom and 2 kids   Social Drivers of Health   Financial Resource Strain: Low Risk  (11/21/2023)   Received from [facility]   Overall Financial Resource Strain (CARDIA)    Difficulty of Paying Living Expenses: Not hard at all  Food Insecurity: No Food Insecurity (11/21/2023)   Received from [facility]   Hunger Vital Sign    Within the past 12 months, you worried that your food would run out before you got the money to  buy more.: Never true    Within the past 12 months, the food you bought just didn't last and you didn't have money to get more.: Never true  Transportation Needs: No Transportation Needs (11/21/2023)   Received from [facility] - Transportation    In the past 12 months, has lack of transportation kept you from medical appointments or from getting medications?: No    Lack of Transportation (Non-Medical): No  Physical Activity: Not on file  Stress: No Stress Concern Present (10/10/2022)   [employer] [facility] [employer] [facility] - Occupational Stress Questionnaire    Feeling of Stress : Only a little  Social Connections: Not on file  Intimate Partner Violence: Not At Risk (12/31/2022)   Humiliation, Afraid, Rape, and Kick questionnaire    Fear of Current or Ex-Partner: No    Emotionally Abused: No    Physically Abused: No    Sexually Abused: No    FAMILY HISTORY: Family History  Problem Relation Age of Onset   Breast cancer Mother 63       no GT   Breast cancer Maternal Aunt 33   Breast cancer Maternal Aunt    Lung cancer Maternal Uncle    Lung cancer Maternal Uncle    Bone cancer Paternal Aunt    Pancreatic cancer Maternal Grandmother 35   Lung cancer Maternal Grandfather 46   Breast cancer Cousin 40   Breast cancer Other 33       gene pos?    ALLERGIES:  has no known allergies.  MEDICATIONS:  Current Outpatient Medications  Medication Sig Dispense Refill   senna (SENOKOT) 8.6 MG TABS tablet Take 2 tablets (17.2 mg total) by mouth daily. 120 tablet 2   tranexamic acid (LYSTEDA) 650 MG TABS tablet Take 1,300 mg by mouth. Take 2 tablets (1,300 mg total) by mouth 3 (three) times daily Take  for a maximum of 4-5 days during monthly menstruation.     apixaban  (ELIQUIS ) 5 MG TABS tablet Take 1 tablet (5 mg total) by mouth 2 (two) times daily. (Patient not taking: Reported on 12/12/2023) 90 tablet 0   diazepam  (VALIUM ) 2 MG tablet Take 1 tablet (2 mg  total) by mouth every 12 (twelve) hours as needed for up to 20 doses for muscle spasms. (Patient not taking: Reported on 12/12/2023) 20 tablet 0   No current facility-administered medications for this visit.   Facility-Administered Medications Ordered in Other Visits  Medication Dose Route Frequency Provider Last Rate Last Admin   0.9 %  sodium chloride  infusion   Intravenous Continuous Babara Call, MD       pembrolizumab  (KEYTRUDA ) 200 mg in sodium chloride  0.9 % 50 mL chemo infusion  200 mg Intravenous Once Babara Call, MD        Review of Systems  Constitutional:  Negative for appetite change, chills, fatigue and fever.  HENT:   Negative for hearing loss and voice change.   Eyes:  Negative for eye problems.  Respiratory:  Negative for chest tightness and cough.   Cardiovascular:  Negative for chest pain.  Gastrointestinal:  Negative for abdominal distention, abdominal pain and blood in stool.  Endocrine: Negative for hot flashes.  Genitourinary:  Negative for difficulty urinating and frequency.   Musculoskeletal:  Negative for arthralgias.  Skin:  Negative for itching and rash.  Neurological:  Negative for extremity weakness and headaches.  Hematological:  Negative for adenopathy.  Psychiatric/Behavioral:  Negative for confusion.      PHYSICAL EXAMINATION: ECOG PERFORMANCE STATUS: 0 - Asymptomatic  Vitals:   12/12/23 0831  BP: 123/74  Pulse: (!) 58  Resp: 18  Temp: (!) 96 F (35.6 C)   Filed Weights   12/12/23 0831  Weight: 148 lb 6.4 oz (67.3 kg)    Physical Exam Constitutional:      General: She is not in acute distress.    Appearance: She is not diaphoretic.  HENT:     Head: Normocephalic and atraumatic.  Eyes:     General: No scleral icterus. Cardiovascular:     Rate and Rhythm: Normal rate and regular rhythm.  Pulmonary:     Effort: Pulmonary effort is normal. No respiratory distress.     Breath sounds: No wheezing.  Abdominal:     General: Bowel sounds are  normal. There is no distension.     Palpations: Abdomen is soft.  Musculoskeletal:        General: Normal range of motion.     Cervical back: Normal range of motion and neck supple.  Skin:    General: Skin is warm and dry.     Findings: No erythema.     Comments: Left anterior chest wall medi port  Neurological:     Mental Status: She is alert and oriented to person, place, and time. Mental status is at baseline.     Motor: No abnormal muscle tone.  Psychiatric:        Mood and Affect: Mood and affect normal.     LABORATORY DATA:  I have reviewed the data as listed    Latest Ref Rng & Units 12/12/2023    8:14 AM 11/21/2023    8:08 AM 10/31/2023   11:25 AM  CBC  WBC 4.0 - 10.5 K/uL 5.0  4.8  3.7   Hemoglobin 12.0 - 15.0 g/dL 89.9  89.9  89.7   Hematocrit 36.0 - 46.0 %  32.4  32.0  32.6   Platelets 150 - 400 K/uL 215  232  206       Latest Ref Rng & Units 12/12/2023    8:14 AM 11/21/2023    8:08 AM 08/22/2023    8:10 AM  CMP  Glucose 70 - 99 mg/dL 897  93  98   BUN 6 - 20 mg/dL 12  8  8    Creatinine 0.44 - 1.00 mg/dL 9.36  9.48  9.27   Sodium 135 - 145 mmol/L 137  135  137   Potassium 3.5 - 5.1 mmol/L 4.0  3.7  3.9   Chloride 98 - 111 mmol/L 108  106  107   CO2 22 - 32 mmol/L 21  23  24    Calcium  8.9 - 10.3 mg/dL 8.7  8.4  8.6   Total Protein 6.5 - 8.1 g/dL 6.7  6.7  6.5   Total Bilirubin 0.0 - 1.2 mg/dL 0.6  0.4  0.4   Alkaline Phos 38 - 126 U/L 80  88  90   AST 15 - 41 U/L 23  21  27    ALT 0 - 44 U/L 17  15  24       RADIOGRAPHIC STUDIES: I have personally reviewed the radiological images as listed and agreed with the findings in the report. No results found.

## 2023-12-12 NOTE — Assessment & Plan Note (Signed)
 Recommend OTC calcium  1200mg  and Vitamin D 1000 units supplementation

## 2023-12-12 NOTE — Assessment & Plan Note (Signed)
 Immunotherapy plan as listed above

## 2023-12-12 NOTE — Assessment & Plan Note (Addendum)
 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  S/p neoadjuvant chemotherapy with Carboplatin , taxol  Keytruda  Q3 weeks x 4 cycle  followed by [facility] + Keytrua with D4 GCSF x 4 cycles.  S/p bilateral mastectomy, no residual disease. --> reconstruction in progress.  Patient has BRCA 1 mutation, no residual disease, does not qualify Olarparib eligibility.  S/p adjuvant RT  Recommend adjuvant Keytrya every 3 weeks, plan for 9 cycles.  Labs are reviewed and discussed with patient. Proceed with cycle 3 Keytruda .

## 2023-12-12 NOTE — Assessment & Plan Note (Signed)
 Once she finished treatment of breast cancer, recommend risk-reducing salpingo oophorectomy- planned in December 2025 pancreatic screening at age 38 (or 9 years younger than the earliest exocrine pancreatic cancer diagnosis in the family, whichever is earlier)  Family members are encouraged to consider genetic testing for this familial pathogenic variant.

## 2023-12-12 NOTE — Assessment & Plan Note (Signed)
 Labs are reviewed and discussed with patient. Lab Results  Component Value Date   HGB 10.0 (L) 12/12/2023   TIBC 377 02/28/2023   IRONPCTSAT 11 02/28/2023   FERRITIN 23 02/28/2023   Hemoglobin is decreased. Check iron  panel

## 2023-12-22 ENCOUNTER — Ambulatory Visit: Payer: Self-pay | Admitting: Oncology

## 2023-12-31 ENCOUNTER — Ambulatory Visit: Attending: Radiation Oncology | Admitting: Radiation Oncology

## 2024-01-02 ENCOUNTER — Encounter: Payer: Self-pay | Admitting: Oncology

## 2024-01-02 ENCOUNTER — Inpatient Hospital Stay (HOSPITAL_BASED_OUTPATIENT_CLINIC_OR_DEPARTMENT_OTHER): Admitting: Oncology

## 2024-01-02 ENCOUNTER — Inpatient Hospital Stay: Attending: Oncology

## 2024-01-02 ENCOUNTER — Inpatient Hospital Stay

## 2024-01-02 ENCOUNTER — Ambulatory Visit

## 2024-01-02 VITALS — BP 97/42 | HR 74 | Temp 97.3°F | Resp 18 | Ht 61.0 in | Wt 144.2 lb

## 2024-01-02 VITALS — BP 117/71

## 2024-01-02 DIAGNOSIS — D509 Iron deficiency anemia, unspecified: Secondary | ICD-10-CM | POA: Insufficient documentation

## 2024-01-02 DIAGNOSIS — Z803 Family history of malignant neoplasm of breast: Secondary | ICD-10-CM | POA: Insufficient documentation

## 2024-01-02 DIAGNOSIS — Z1501 Genetic susceptibility to malignant neoplasm of breast: Secondary | ICD-10-CM | POA: Diagnosis not present

## 2024-01-02 DIAGNOSIS — Z86711 Personal history of pulmonary embolism: Secondary | ICD-10-CM | POA: Diagnosis not present

## 2024-01-02 DIAGNOSIS — C50919 Malignant neoplasm of unspecified site of unspecified female breast: Secondary | ICD-10-CM

## 2024-01-02 DIAGNOSIS — Z5112 Encounter for antineoplastic immunotherapy: Secondary | ICD-10-CM | POA: Diagnosis present

## 2024-01-02 DIAGNOSIS — Z1509 Genetic susceptibility to other malignant neoplasm: Secondary | ICD-10-CM

## 2024-01-02 DIAGNOSIS — Z1502 Genetic susceptibility to malignant neoplasm of ovary: Secondary | ICD-10-CM | POA: Diagnosis not present

## 2024-01-02 DIAGNOSIS — C50312 Malignant neoplasm of lower-inner quadrant of left female breast: Secondary | ICD-10-CM | POA: Insufficient documentation

## 2024-01-02 DIAGNOSIS — Z9013 Acquired absence of bilateral breasts and nipples: Secondary | ICD-10-CM | POA: Diagnosis not present

## 2024-01-02 DIAGNOSIS — Z171 Estrogen receptor negative status [ER-]: Secondary | ICD-10-CM | POA: Insufficient documentation

## 2024-01-02 DIAGNOSIS — D500 Iron deficiency anemia secondary to blood loss (chronic): Secondary | ICD-10-CM

## 2024-01-02 DIAGNOSIS — Z1505 Genetic susceptibility to malignant neoplasm of fallopian tube(s): Secondary | ICD-10-CM | POA: Insufficient documentation

## 2024-01-02 DIAGNOSIS — Z7901 Long term (current) use of anticoagulants: Secondary | ICD-10-CM | POA: Diagnosis not present

## 2024-01-02 DIAGNOSIS — Z1589 Genetic susceptibility to other disease: Secondary | ICD-10-CM

## 2024-01-02 DIAGNOSIS — Z1732 Human epidermal growth factor receptor 2 negative status: Secondary | ICD-10-CM | POA: Diagnosis not present

## 2024-01-02 DIAGNOSIS — Z800 Family history of malignant neoplasm of digestive organs: Secondary | ICD-10-CM | POA: Insufficient documentation

## 2024-01-02 DIAGNOSIS — Z801 Family history of malignant neoplasm of trachea, bronchus and lung: Secondary | ICD-10-CM | POA: Diagnosis not present

## 2024-01-02 DIAGNOSIS — Z15060 Genetic susceptibility to colorectal cancer: Secondary | ICD-10-CM

## 2024-01-02 DIAGNOSIS — Z15068 Genetic susceptibility to other malignant neoplasm of digestive system: Secondary | ICD-10-CM

## 2024-01-02 DIAGNOSIS — Z1722 Progesterone receptor negative status: Secondary | ICD-10-CM | POA: Insufficient documentation

## 2024-01-02 LAB — CBC WITH DIFFERENTIAL (CANCER CENTER ONLY)
Abs Immature Granulocytes: 0.02 K/uL (ref 0.00–0.07)
Basophils Absolute: 0 K/uL (ref 0.0–0.1)
Basophils Relative: 0 %
Eosinophils Absolute: 0 K/uL (ref 0.0–0.5)
Eosinophils Relative: 0 %
HCT: 32.6 % — ABNORMAL LOW (ref 36.0–46.0)
Hemoglobin: 10.2 g/dL — ABNORMAL LOW (ref 12.0–15.0)
Immature Granulocytes: 1 %
Lymphocytes Relative: 24 %
Lymphs Abs: 1 K/uL (ref 0.7–4.0)
MCH: 24.2 pg — ABNORMAL LOW (ref 26.0–34.0)
MCHC: 31.3 g/dL (ref 30.0–36.0)
MCV: 77.3 fL — ABNORMAL LOW (ref 80.0–100.0)
Monocytes Absolute: 0.3 K/uL (ref 0.1–1.0)
Monocytes Relative: 6 %
Neutro Abs: 2.9 K/uL (ref 1.7–7.7)
Neutrophils Relative %: 69 %
Platelet Count: 202 K/uL (ref 150–400)
RBC: 4.22 MIL/uL (ref 3.87–5.11)
RDW: 14.6 % (ref 11.5–15.5)
WBC Count: 4.3 K/uL (ref 4.0–10.5)
nRBC: 0 % (ref 0.0–0.2)

## 2024-01-02 LAB — CMP (CANCER CENTER ONLY)
ALT: 21 U/L (ref 0–44)
AST: 26 U/L (ref 15–41)
Albumin: 3.7 g/dL (ref 3.5–5.0)
Alkaline Phosphatase: 83 U/L (ref 38–126)
Anion gap: 8 (ref 5–15)
BUN: 9 mg/dL (ref 6–20)
CO2: 22 mmol/L (ref 22–32)
Calcium: 8.7 mg/dL — ABNORMAL LOW (ref 8.9–10.3)
Chloride: 107 mmol/L (ref 98–111)
Creatinine: 0.63 mg/dL (ref 0.44–1.00)
GFR, Estimated: >60 mL/min (ref >60)
Glucose, Bld: 79 mg/dL (ref 70–99)
Potassium: 3.6 mmol/L (ref 3.5–5.1)
Sodium: 137 mmol/L (ref 135–145)
Total Bilirubin: 0.5 mg/dL (ref 0.0–1.2)
Total Protein: 7 g/dL (ref 6.5–8.1)

## 2024-01-02 MED ORDER — IRON SUCROSE 20 MG/ML IV SOLN
200.0000 mg | Freq: Once | INTRAVENOUS | Status: AC
  Administered 2024-01-02: 200 mg via INTRAVENOUS
  Filled 2024-01-02: qty 10

## 2024-01-02 MED ORDER — SODIUM CHLORIDE 0.9 % IV SOLN
INTRAVENOUS | Status: DC
  Filled 2024-01-02: qty 250

## 2024-01-02 MED ORDER — ALTEPLASE 2 MG IJ SOLR
2.0000 mg | Freq: Once | INTRAMUSCULAR | Status: AC | PRN
  Administered 2024-01-02: 2 mg
  Filled 2024-01-02: qty 2

## 2024-01-02 MED ORDER — SODIUM CHLORIDE 0.9 % IV SOLN
200.0000 mg | Freq: Once | INTRAVENOUS | Status: AC
  Administered 2024-01-02: 200 mg via INTRAVENOUS
  Filled 2024-01-02: qty 8

## 2024-01-02 NOTE — Assessment & Plan Note (Signed)
 Labs are reviewed and discussed with patient. Lab Results  Component Value Date   HGB 10.2 (L) 01/02/2024   TIBC 417 12/12/2023   IRONPCTSAT 6 (L) 12/12/2023   FERRITIN 5 (L) 12/12/2023   Recommend IV Venofer  weekly x 4

## 2024-01-02 NOTE — Progress Notes (Signed)
 Needs refill lysteda, pended.

## 2024-01-02 NOTE — Assessment & Plan Note (Signed)
 Once she finished treatment of breast cancer, recommend risk-reducing salpingo oophorectomy- planned in December 2025 pancreatic screening at age 38 (or 9 years younger than the earliest exocrine pancreatic cancer diagnosis in the family, whichever is earlier)  Family members are encouraged to consider genetic testing for this familial pathogenic variant.

## 2024-01-02 NOTE — Progress Notes (Signed)
 Hematology/Oncology Progress note [phone] [phone]       CHIEF COMPLAINTS/PURPOSE OF CONSULTATION:  Left triple negative breast cancer.   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  S/p neoadjuvant chemotherapy with Carboplatin , taxol  Keytruda  Q3 weeks x 4 cycle  followed by [facility] + Keytrua with D4 GCSF x 4 cycles.  S/p bilateral mastectomy. no residual disease. ypT0 ypN0 (sn)  --> reconstruction in progress. S/p adjuvant RT  Patient has BRCA 1 mutation, no residual disease, does not qualify Olarparib eligibility.   Recommend adjuvant Keytrya every 3 weeks, plan for 9 cycles.  Labs are reviewed and discussed with patient. Proceed with cycle 4 Keytruda .    BRCA1 gene mutation positive Once she finished treatment of breast cancer, recommend risk-reducing salpingo oophorectomy- planned in December 2025 pancreatic screening at age 74 (or 10 years younger than the earliest exocrine pancreatic cancer diagnosis in the family, whichever is earlier)  Family members are encouraged to consider genetic testing for this familial pathogenic variant.   Encounter for antineoplastic immunotherapy Immunotherapy plan as listed above  Hypocalcemia Recommend OTC calcium  1200mg  and Vitamin D 1000 units supplementation  IDA (iron  deficiency anemia) Labs are reviewed and discussed with patient. Lab Results  Component Value Date   HGB 10.2 (L) 01/02/2024   TIBC 417 12/12/2023   IRONPCTSAT 6 (L) 12/12/2023   FERRITIN 5 (L) 12/12/2023   Recommend IV Venofer  weekly x 4  No orders of the defined types were placed in this encounter.  Follow up 3 weeks.  All questions were answered. The patient knows to  call the clinic with any problems, questions or concerns.  Call Babara, MD, PhD [facility] Hematology Oncology 01/02/2024    HISTORY OF PRESENTING ILLNESS:  CLARK CLOWDUS 38 y.o. female presents to establish care for left triple negative breast cancer I have reviewed her chart and materials related to her cancer extensively and collaborated history with the patient. Summary of oncologic history is as follows: Oncology History  Invasive carcinoma of breast (HCC)  07/24/2022 Mammogram   She noticed left breast mass for 1 month.  Bilateral diagnostic mammogram showed Suspicious palpable left breast mass 8 o'clock position. Cortically thickened left axillary lymph node   10/10/2022 Initial Diagnosis   Invasive carcinoma of breast (HCC)  10/02/2022  Left breast mass biopsy and left axillary lymph node biopsy.  Diagnosis 1. Breast, left, needle core biopsy, 8 o'clock 6 cmfn, heart clip - INVASIVE MAMMARY CARCINOMA, NO SPECIAL TYPE. - TUBULE FORMATION: SCORE 3 - NUCLEAR PLEOMORPHISM: SCORE 3 - MITOTIC COUNT: SCORE 3 - TOTAL SCORE: 9 - OVERALL GRADE: 3 - LYMPHOVASCULAR INVASION: NOT IDENTIFIED - CANCER LENGTH: 11 MM - CALCIFICATIONS: NOT IDENTIFIED - DUCTAL CARCINOMA IN SITU: PRESENT, HIGH-GRADE - ER-, PR- HER2 - [IHC 1+] 2. Lymph node, needle/core biopsy, left axillary, hydromark - LYMPH NODE WITH REACTIVE CHANGES; NEGATIVE FOR MALIGNANCY.  Menarche at age of 70 First live birth at age of 86 OCP use: no History of hysterectomy: no Menopausal status: premenopausal History of HRT use: no History of chest radiation: no Number of previous breast biopsies:  right breast biopsy 05/20/2019 negative for malignancy Strong family history of cancer mother breast cancer diagnosed in 76s, maternal aunts  x 2 breast cancer, maternal cousins breast cancer x 2, maternal grandmother pancreatic cancer.    10/10/2022 Cancer Staging   Staging form: Breast, AJCC 8th Edition - Clinical stage from  10/10/2022: Stage IIIB (cT3, cN0, cM0, G3, ER-, PR-, HER2-) - Signed by Babara Call, MD on 10/18/2022 Stage prefix: Initial diagnosis Nuclear grade: G3 Histologic grading system: 3 grade system   10/17/2022 Imaging   Bilateral MRI breasts w wo contrast  1. In total, there is approximately 6.7 cm of abnormal enhancement, predominantly in the lower-inner inferior left breast, with the ultrasound-guided biopsy marking clip centrally positioned within the enhancement. There are scattered small enhancing masses in the lower inner and lower outer anterior left (included in the measurement above).   2.  No evidence of right breast malignancy.   3. The axillary lymph nodes are not included within the field of view of this MRI for adequate assessment of lymphadenopathy.    10/17/2022 Procedure   S/p medi port placement.    10/25/2022 - 10/25/2022 Chemotherapy   Patient is on Treatment Plan : BREAST ADJUVANT DOSE DENSE AC q14d / PACLitaxel  q7d     10/25/2022 - 05/19/2023 Chemotherapy   Patient is on Treatment Plan : BREAST Pembrolizumab  (200) D1 + Carboplatin  (5) D1 + Paclitaxel  (80) D1,8,15 q21d X 4 cycles / Pembrolizumab  (200) D1 + AC D1 q21d x 4 cycles      Genetic Testing   Single pathogenic variant in BRCA1 called c.190T>G identified on the [facility] Multi-Cancer+RNA panel. The remainder of testing was normal. The report date is 11/11/2022.  The Multi-Cancer + RNA Panel offered by [facility] includes sequencing and/or deletion/duplication analysis of the following 70 genes:  AIP*, ALK, APC*, ATM*, AXIN2*, BAP1*, BARD1*, BLM*, BMPR1A*, BRCA1*, BRCA2*, BRIP1*, CDC73*, CDH1*, CDK4, CDKN1B*, CDKN2A, CHEK2*, CTNNA1*, DICER1*, EPCAM, EGFR, FH*, FLCN*, GREM1, HOXB13, KIT, LZTR1, MAX*, MBD4, MEN1*, MET, MITF, MLH1*, MSH2*, MSH3*, MSH6*, MUTYH*, NF1*, NF2*, NTHL1*, PALB2*, PDGFRA, PMS2*, POLD1*, POLE*, POT1*, PRKAR1A*, PTCH1*, PTEN*, RAD51C*, RAD51D*, RB1*, RET, SDHA*, SDHAF2*, SDHB*, SDHC*, SDHD*, SMAD4*, SMARCA4*,  SMARCB1*, SMARCE1*, STK11*, SUFU*, TMEM127*, TP53*, TSC1*, TSC2*, VHL*. RNA analysis is performed for * genes.   11/24/2022 - 11/25/2022 Hospital Admission   Admitted due to acute pulmonary embolism, lower extremity US  negative for DVT.  Patietn was started on heparin  gtt and transitioned to Eliquis  at discharge.    02/10/2023 Procedure   S/p port placement.    03/10/2023 Imaging   MRI bilateral breast w wo contrast  No abnormal enhancement in the left breast consistent with response to neoadjuvant chemotherapy.   07/24/2023 Surgery   Patient underwent bilateral mastectomy 1. Breast, simple mastectomy, left, long stitch lateral, short stitch superior :      - CHANGES CONSISTENT WITH NEOADJUVANT THERAPY, NEGATIVE FOR RESIDUAL MALIGNANCY.      - SEE NOTE.      - SCLEROSING ADENOSIS AND SECRETORY CHANGE.      - BIOPSY SITE CHANGE WITH CLIP.      - UNREMARKABLE NIPPLE AND SKIN.       2. Breast, simple mastectomy, right, long stitch lateral, short stitch superior :      - SCLEROSING ADENOSIS, FIBROUS STROMA, AND FIBROADENOMATOID CHANGE.      - UNREMARKABLE NIPPLE AND SKIN.      - NEGATIVE FOR ATYPIA AND MALIGNANCY.       3. Lymph node, sentinel, biopsy, left axillary #1 :      - TWO LYMPH NODES NEGATIVE FOR MALIGNANCY (0/2).      -  SEE NOTE.      - BIOPSY SITE CHANGE INVOLVING ONE LYMPH NODE.      - FIBROUS SCARRING / CHANGES SUGGESTIVE OF NEOADJUVANT THERAPY ARE NOT      IDENTIFIED.       4. Breast, excision, left lateral margin :      - UNREMARKABLE BREAST TISSUE.      - NEGATIVE FOR ATYPIA AND MALIGNANCY.  The findings in the left simple mastectomy (specimen 1) are       consistent with a complete pathologic response to neoadjuvant therapy.Based on       these findings and those in the left axillary sentinel lymph node excision       (specimen 3) the AJCC staging is ypT0 ypN0 (sn)     08/22/2023 -  Chemotherapy   Patient is on Treatment Plan : BREAST Pembrolizumab  (200) q21d  x 27 weeks      Today she presents for evaluation prior to immunotherapy.  S/p bilateral mastectomy, ongoing plastic surgery  for reconstruction. S/p post mastectomy radiation.  She reports no new complaints. There is plan of hysterectomy in December   MEDICAL HISTORY:  Past Medical History:  Diagnosis Date   Acute pulmonary embolism (HCC)    BRCA1 gene mutation positive    Breast lump    Chronic anticoagulation    Endocarditis    Fibroid, uterine    Immunosuppressed due to chemotherapy    Microcytic anemia    MSSA bacteremia    Multifocal pneumonia    Sepsis (HCC)    due to port a cath   STD (sexually transmitted disease)    From Medical Hx;   Thrombocytopenia    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   Vascular port complication     SURGICAL HISTORY: Past Surgical History:  Procedure Laterality Date   BREAST BIOPSY Right 05/20/2019   Affirm Bx- X- clip, neg   BREAST BIOPSY Left 10/02/2022   US  Bx, path pending   BREAST BIOPSY Left 10/02/2022   Us  Bx Node- path pending   BREAST BIOPSY Left 10/02/2022   US  LT BREAST BX W LOC DEV 1ST LESION IMG BX SPEC US  GUIDE 10/02/2022 [facility]   BREAST RECONSTRUCTION Bilateral 07/24/2023   Procedure: RECONSTRUCTION, BREAST;  Surgeon: Lowery Estefana RAMAN, DO;  Location: [facility];  Service: Plastics;  Laterality: Bilateral;  BILATERAL IMMEDIATE BREAST RECONSTRUCTION WITH EXPANDER AND FLEX HD PLACEMENT   CESAREAN SECTION     x 2   LAPAROSCOPIC BILATERAL SALPINGECTOMY Bilateral 02/09/2022   Procedure: LAPAROSCOPIC BILATERAL SALPINGECTOMY;  Surgeon: Connell Davies, MD;  Location: [facility];  Service: Gynecology;  Laterality: Bilateral;   MASTECTOMY W/ SENTINEL NODE BIOPSY Bilateral 07/24/2023   Procedure: MASTECTOMY WITH SENTINEL LYMPH NODE BIOPSY;  Surgeon: Tye Millet, DO;  Location: [facility];  Service: General;  Laterality: Bilateral;  SN biopsy left only   PORTACATH PLACEMENT Right 10/17/2022   Procedure: INSERTION  PORT-A-CATH;  Surgeon: Desiderio Schanz, MD;  Location: [facility];  Service: General;  Laterality: Right;   PORTACATH PLACEMENT Left 02/10/2023   Procedure: INSERTION PORT-A-CATH;  Surgeon: Tye Millet, DO;  Location: [facility];  Service: General;  Laterality: Left;   REMOVAL OF BILATERAL TISSUE EXPANDERS WITH PLACEMENT OF BILATERAL BREAST IMPLANTS Bilateral 09/11/2023   Procedure: REMOVAL, TISSUE EXPANDER, BREAST, BILATERAL, WITH BILATERAL IMPLANT IMPLANT INSERTION;  Surgeon: Lowery Estefana RAMAN, DO;  Location: [facility];  Service: Plastics;  Laterality: Bilateral;  right expander removal and replacement.  TEE WITHOUT CARDIOVERSION N/A 01/02/2023   Procedure: TRANSESOPHAGEAL ECHOCARDIOGRAM (TEE);  Surgeon: Perla Evalene PARAS, MD;  Location: [facility];  Service: Cardiovascular;  Laterality: N/A;   TUBAL LIGATION      SOCIAL HISTORY: Social History   Socioeconomic History   Marital status: Single    Spouse name: Not on file   Number of children: 2   Years of education: Not on file   Highest education level: High school graduate  Occupational History   Not on file  Tobacco Use   Smoking status: Never    Passive exposure: Never   Smokeless tobacco: Never  Vaping Use   Vaping status: Never Used  Substance and Sexual Activity   Alcohol use: Never   Drug use: No   Sexual activity: Yes    Birth control/protection: Surgical  Other Topics Concern   Not on file  Social History Narrative   Lives with mom and 2 kids   Social Drivers of Health   Financial Resource Strain: Low Risk  (11/21/2023)   Received from [facility]   Overall Financial Resource Strain (CARDIA)    Difficulty of Paying Living Expenses: Not hard at all  Food Insecurity: No Food Insecurity (11/21/2023)   Received from [facility]   Hunger Vital Sign    Within the past 12 months, you worried that your food would run out before you got the money to buy more.: Never true     Within the past 12 months, the food you bought just didn't last and you didn't have money to get more.: Never true  Transportation Needs: No Transportation Needs (11/21/2023)   Received from [facility] - Transportation    In the past 12 months, has lack of transportation kept you from medical appointments or from getting medications?: No    Lack of Transportation (Non-Medical): No  Physical Activity: Not on file  Stress: No Stress Concern Present (10/10/2022)   [employer] [facility] [employer] - Occupational Stress Questionnaire    Feeling of Stress : Only a little  Social Connections: Not on file  Intimate Partner Violence: Not At Risk (12/31/2022)   Humiliation, Afraid, Rape, and Kick questionnaire    Fear of Current or Ex-Partner: No    Emotionally Abused: No    Physically Abused: No    Sexually Abused: No    FAMILY HISTORY: Family History  Problem Relation Age of Onset   Breast cancer Mother 79       no GT   Breast cancer Maternal Aunt 33   Breast cancer Maternal Aunt    Lung cancer Maternal Uncle    Lung cancer Maternal Uncle    Bone cancer Paternal Aunt    Pancreatic cancer Maternal Grandmother 40   Lung cancer Maternal Grandfather 67   Breast cancer Cousin 40   Breast cancer Other 33       gene pos?    ALLERGIES:  has no known allergies.  MEDICATIONS:  Current Outpatient Medications  Medication Sig Dispense Refill   senna (SENOKOT) 8.6 MG TABS tablet Take 2 tablets (17.2 mg total) by mouth daily. 120 tablet 2   tranexamic acid (LYSTEDA) 650 MG TABS tablet Take 1,300 mg by mouth. Take 2 tablets (1,300 mg total) by mouth 3 (three) times daily Take for a maximum of 4-5 days during monthly menstruation.     No current facility-administered medications for this visit.   Facility-Administered Medications Ordered in Other Visits  Medication Dose Route Frequency Provider Last Rate Last Admin   0.9 %  sodium chloride  infusion    Intravenous Continuous Babara Call, MD 10 mL/hr at 01/02/24 0921 New Bag at 01/02/24 0921   pembrolizumab  (KEYTRUDA ) 200 mg in sodium chloride  0.9 % 50 mL chemo infusion  200 mg Intravenous Once Babara Call, MD 116 mL/hr at 01/02/24 0948 200 mg at 01/02/24 0948    Review of Systems  Constitutional:  Negative for appetite change, chills, fatigue and fever.  HENT:   Negative for hearing loss and voice change.   Eyes:  Negative for eye problems.  Respiratory:  Negative for chest tightness and cough.   Cardiovascular:  Negative for chest pain.  Gastrointestinal:  Negative for abdominal distention, abdominal pain and blood in stool.  Endocrine: Negative for hot flashes.  Genitourinary:  Negative for difficulty urinating and frequency.   Musculoskeletal:  Negative for arthralgias.  Skin:  Negative for itching and rash.  Neurological:  Negative for extremity weakness and headaches.  Hematological:  Negative for adenopathy.  Psychiatric/Behavioral:  Negative for confusion.      PHYSICAL EXAMINATION: ECOG PERFORMANCE STATUS: 0 - Asymptomatic  Vitals:   01/02/24 0837  BP: (!) 97/42  Pulse: 74  Resp: 18  Temp: (!) 97.3 F (36.3 C)  SpO2: 100%   Filed Weights   01/02/24 0837  Weight: 144 lb 3.2 oz (65.4 kg)    Physical Exam Constitutional:      General: She is not in acute distress.    Appearance: She is not diaphoretic.  HENT:     Head: Normocephalic and atraumatic.  Eyes:     General: No scleral icterus. Cardiovascular:     Rate and Rhythm: Normal rate and regular rhythm.  Pulmonary:     Effort: Pulmonary effort is normal. No respiratory distress.     Breath sounds: No wheezing.  Abdominal:     General: Bowel sounds are normal. There is no distension.     Palpations: Abdomen is soft.  Musculoskeletal:        General: Normal range of motion.     Cervical back: Normal range of motion and neck supple.  Skin:    General: Skin is warm and dry.     Findings: No erythema.      Comments: Left anterior chest wall medi port  Neurological:     Mental Status: She is alert and oriented to person, place, and time. Mental status is at baseline.     Motor: No abnormal muscle tone.  Psychiatric:        Mood and Affect: Mood and affect normal.     LABORATORY DATA:  I have reviewed the data as listed    Latest Ref Rng & Units 01/02/2024    8:12 AM 12/12/2023    8:14 AM 11/21/2023    8:08 AM  CBC  WBC 4.0 - 10.5 K/uL 4.3  5.0  4.8   Hemoglobin 12.0 - 15.0 g/dL 89.7  89.9  89.9   Hematocrit 36.0 - 46.0 % 32.6  32.4  32.0   Platelets 150 - 400 K/uL 202  215  232       Latest Ref Rng & Units 01/02/2024    8:12 AM 12/12/2023    8:14 AM 11/21/2023    8:08 AM  CMP  Glucose 70 - 99 mg/dL 79  897  93   BUN 6 - 20 mg/dL 9  12  8    Creatinine 0.44 - 1.00 mg/dL 9.36  0.63  0.51   Sodium 135 - 145 mmol/L 137  137  135   Potassium 3.5 - 5.1 mmol/L 3.6  4.0  3.7   Chloride 98 - 111 mmol/L 107  108  106   CO2 22 - 32 mmol/L 22  21  23    Calcium  8.9 - 10.3 mg/dL 8.7  8.7  8.4   Total Protein 6.5 - 8.1 g/dL 7.0  6.7  6.7   Total Bilirubin 0.0 - 1.2 mg/dL 0.5  0.6  0.4   Alkaline Phos 38 - 126 U/L 83  80  88   AST 15 - 41 U/L 26  23  21    ALT 0 - 44 U/L 21  17  15       RADIOGRAPHIC STUDIES: I have personally reviewed the radiological images as listed and agreed with the findings in the report. No results found.

## 2024-01-02 NOTE — Patient Instructions (Signed)
 [facility] - A DEPT OF [facility]  Discharge Instructions: Thank you for choosing [facility] to provide your oncology and hematology care.  If you have a lab appointment with the Cancer Center, please go directly to the Cancer Center and check in at the registration area.  Wear comfortable clothing and clothing appropriate for easy access to any Portacath or PICC line.   We strive to give you quality time with your provider. You may need to reschedule your appointment if you arrive late (15 or more minutes).  Arriving late affects you and other patients whose appointments are after yours.  Also, if you miss three or more appointments without notifying the office, you may be dismissed from the clinic at the provider's discretion.      For prescription refill requests, have your pharmacy contact our office and allow 72 hours for refills to be completed.    Today you received the following chemotherapy and/or immunotherapy agents KEYTRUDA  and VENOFER       To help prevent nausea and vomiting after your treatment, we encourage you to take your nausea medication as directed.  BELOW ARE SYMPTOMS THAT SHOULD BE REPORTED IMMEDIATELY: *FEVER GREATER THAN 100.4 F (38 C) OR HIGHER *CHILLS OR SWEATING *NAUSEA AND VOMITING THAT IS NOT CONTROLLED WITH YOUR NAUSEA MEDICATION *UNUSUAL SHORTNESS OF BREATH *UNUSUAL BRUISING OR BLEEDING *URINARY PROBLEMS (pain or burning when urinating, or frequent urination) *BOWEL PROBLEMS (unusual diarrhea, constipation, pain near the anus) TENDERNESS IN MOUTH AND THROAT WITH OR WITHOUT PRESENCE OF ULCERS (sore throat, sores in mouth, or a toothache) UNUSUAL RASH, SWELLING OR PAIN  UNUSUAL VAGINAL DISCHARGE OR ITCHING   Items with * indicate a potential emergency and should be followed up as soon as possible or go to the Emergency Department if any problems should occur.  Please show the CHEMOTHERAPY ALERT CARD or  IMMUNOTHERAPY ALERT CARD at check-in to the Emergency Department and triage nurse.  Should you have questions after your visit or need to cancel or reschedule your appointment, please contact [facility] - A DEPT OF [facility]  [phone] and follow the prompts.  Office hours are 8:00 a.m. to 4:30 p.m. Monday - Friday. Please note that voicemails left after 4:00 p.m. may not be returned until the following business day.  We are closed weekends and major holidays. You have access to a nurse at all times for urgent questions. Please call the main number to the clinic [phone] and follow the prompts.  For any non-urgent questions, you may also contact your provider using MyChart. We now offer e-Visits for anyone 33 and older to request care online for non-urgent symptoms. For details visit [email or web address].   Also download the MyChart app! Go to the app store, search MyChart, open the app, select , and log in with your MyChart username and password.   Pembrolizumab  Injection What is this medication? PEMBROLIZUMAB  (PEM broe LIZ ue mab) treats some types of cancer. It works by helping your immune system slow or stop the spread of cancer cells. It is a monoclonal antibody. This medicine may be used for other purposes; ask your health care provider or pharmacist if you have questions. COMMON BRAND NAME(S): Keytruda  What should I tell my care team before I take this medication? They need to know if you have any of these conditions: Allogeneic stem cell transplant (uses someone else's stem cells) Autoimmune diseases, such  as Crohn disease, ulcerative colitis, lupus History of chest radiation Nervous system problems, such as Guillain-Barre syndrome, myasthenia gravis Organ transplant An unusual or allergic reaction to pembrolizumab , other medications, foods, dyes, or preservatives Pregnant or trying to get pregnant Breast-feeding How should I  use this medication? This medication is injected into a vein. It is given by your care team in a hospital or clinic setting. A special MedGuide will be given to you before each treatment. Be sure to read this information carefully each time. Talk to your care team about the use of this medication in children. While it may be prescribed for children as young as 6 months for selected conditions, precautions do apply. Overdosage: If you think you have taken too much of this medicine contact a poison control center or emergency room at once. NOTE: This medicine is only for you. Do not share this medicine with others. What if I miss a dose? Keep appointments for follow-up doses. It is important not to miss your dose. Call your care team if you are unable to keep an appointment. What may interact with this medication? Interactions have not been studied. This list may not describe all possible interactions. Give your health care provider a list of all the medicines, herbs, non-prescription drugs, or dietary supplements you use. Also tell them if you smoke, drink alcohol, or use illegal drugs. Some items may interact with your medicine. What should I watch for while using this medication? Your condition will be monitored carefully while you are receiving this medication. You may need blood work while taking this medication. This medication may cause serious skin reactions. They can happen weeks to months after starting the medication. Contact your care team right away if you notice fevers or flu-like symptoms with a rash. The rash may be red or purple and then turn into blisters or peeling of the skin. You may also notice a red rash with swelling of the face, lips, or lymph nodes in your neck or under your arms. Tell your care team right away if you have any change in your eyesight. Talk to your care team if you may be pregnant. Serious birth defects can occur if you take this medication during pregnancy and  for 4 months after the last dose. You will need a negative pregnancy test before starting this medication. Contraception is recommended while taking this medication and for 4 months after the last dose. Your care team can help you find the option that works for you. Do not breastfeed while taking this medication and for 4 months after the last dose. What side effects may I notice from receiving this medication? Side effects that you should report to your care team as soon as possible: Allergic reactions--skin rash, itching, hives, swelling of the face, lips, tongue, or throat Dry cough, shortness of breath or trouble breathing Eye pain, redness, irritation, or discharge with blurry or decreased vision Heart muscle inflammation--unusual weakness or fatigue, shortness of breath, chest pain, fast or irregular heartbeat, dizziness, swelling of the ankles, feet, or hands Hormone gland problems--headache, sensitivity to light, unusual weakness or fatigue, dizziness, fast or irregular heartbeat, increased sensitivity to cold or heat, excessive sweating, constipation, hair loss, increased thirst or amount of urine, tremors or shaking, irritability Infusion reactions--chest pain, shortness of breath or trouble breathing, feeling faint or lightheaded Kidney injury (glomerulonephritis)--decrease in the amount of urine, red or dark brown urine, foamy or bubbly urine, swelling of the ankles, hands, or feet Liver injury--right upper  belly pain, loss of appetite, nausea, light-colored stool, dark yellow or brown urine, yellowing skin or eyes, unusual weakness or fatigue Pain, tingling, or numbness in the hands or feet, muscle weakness, change in vision, confusion or trouble speaking, loss of balance or coordination, trouble walking, seizures Rash, fever, and swollen lymph nodes Redness, blistering, peeling, or loosening of the skin, including inside the mouth Sudden or severe stomach pain, bloody diarrhea, fever,  nausea, vomiting Side effects that usually do not require medical attention (report to your care team if they continue or are bothersome): Bone, joint, or muscle pain Diarrhea Fatigue Loss of appetite Nausea Skin rash This list may not describe all possible side effects. Call your doctor for medical advice about side effects. You may report side effects to FDA at 1-800-FDA-1088. Where should I keep my medication? This medication is given in a hospital or clinic. It will not be stored at home. NOTE: This sheet is a summary. It may not cover all possible information. If you have questions about this medicine, talk to your doctor, pharmacist, or health care provider.  2024 Elsevier/Gold Standard (2021-06-12 00:00:00)   Iron  Sucrose Injection What is this medication? IRON  SUCROSE (EYE ern SOO krose) treats low levels of iron  (iron  deficiency anemia) in people with kidney disease. Iron  is a mineral that plays an important role in making red blood cells, which carry oxygen from your lungs to the rest of your body. This medicine may be used for other purposes; ask your health care provider or pharmacist if you have questions. COMMON BRAND NAME(S): Venofer  What should I tell my care team before I take this medication? They need to know if you have any of these conditions: Anemia not caused by low iron  levels Heart disease High levels of iron  in the blood Kidney disease Liver disease An unusual or allergic reaction to iron , other medications, foods, dyes, or preservatives Pregnant or trying to get pregnant Breastfeeding How should I use this medication? This medication is infused into a vein. It is given by your care team in a hospital or clinic setting. Talk to your care team about the use of this medication in children. While it may be prescribed for children as young as 2 years for selected conditions, precautions do apply. Overdosage: If you think you have taken too much of this medicine  contact a poison control center or emergency room at once. NOTE: This medicine is only for you. Do not share this medicine with others. What if I miss a dose? Keep appointments for follow-up doses. It is important not to miss your dose. Call your care team if you are unable to keep an appointment. What may interact with this medication? Do not take this medication with any of the following: Deferoxamine Dimercaprol Other iron  products This medication may also interact with the following: Chloramphenicol Deferasirox This list may not describe all possible interactions. Give your health care provider a list of all the medicines, herbs, non-prescription drugs, or dietary supplements you use. Also tell them if you smoke, drink alcohol, or use illegal drugs. Some items may interact with your medicine. What should I watch for while using this medication? Visit your care team for regular checks on your progress. Tell your care team if your symptoms do not start to get better or if they get worse. You may need blood work done while you are taking this medication. You may need to eat more foods that contain iron . Talk to your care team. Foods  that contain iron  include whole grains or cereals, dried fruits, beans, peas, leafy green vegetables, and organ meats (liver, kidney). What side effects may I notice from receiving this medication? Side effects that you should report to your care team as soon as possible: Allergic reactions--skin rash, itching, hives, swelling of the face, lips, tongue, or throat Low blood pressure--dizziness, feeling faint or lightheaded, blurry vision Shortness of breath Side effects that usually do not require medical attention (report to your care team if they continue or are bothersome): Flushing Headache Joint pain Muscle pain Nausea Pain, redness, or irritation at injection site This list may not describe all possible side effects. Call your doctor for medical advice  about side effects. You may report side effects to FDA at 1-800-FDA-1088. Where should I keep my medication? This medication is given in a hospital or clinic. It will not be stored at home. NOTE: This sheet is a summary. It may not cover all possible information. If you have questions about this medicine, talk to your doctor, pharmacist, or health care provider.  2024 Elsevier/Gold Standard (2022-09-18 00:00:00)

## 2024-01-02 NOTE — Assessment & Plan Note (Addendum)
 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  S/p neoadjuvant chemotherapy with Carboplatin , taxol  Keytruda  Q3 weeks x 4 cycle  followed by [facility] + Keytrua with D4 GCSF x 4 cycles.  S/p bilateral mastectomy. no residual disease. ypT0 ypN0 (sn)  --> reconstruction in progress. S/p adjuvant RT  Patient has BRCA 1 mutation, no residual disease, does not qualify Olarparib eligibility.   Recommend adjuvant Keytrya every 3 weeks, plan for 9 cycles.  Labs are reviewed and discussed with patient. Proceed with cycle 4 Keytruda .

## 2024-01-02 NOTE — Assessment & Plan Note (Signed)
 Recommend OTC calcium  1200mg  and Vitamin D 1000 units supplementation

## 2024-01-02 NOTE — Assessment & Plan Note (Signed)
 Immunotherapy plan as listed above

## 2024-01-05 ENCOUNTER — Encounter: Payer: Self-pay | Admitting: Oncology

## 2024-01-16 ENCOUNTER — Inpatient Hospital Stay

## 2024-01-16 NOTE — H&P (Signed)
 Chief Complaint:   Patient ID: Penny Gomez is a 38 y.o. female presenting with Pre Op Consulting (Sign consents)  on 01/16/2024  HPI: Menometrorrhagia, with hx of BRCA1 gene mutation and history of triple negative breast cancer, has completed childbearing.  - Hx of PE 12/2022 with port-a-cath and sepsis - Hx of ectopic with bilateral salpingectomy - Hx of c/s x2    Past Medical History:  has a past medical history of Breast cancer (CMS/HHS-HCC).  Past Surgical History:  has a past surgical history that includes Cesarean section; port-a-cath placement (02/10/2023); mastectomy bilateral simple (Bilateral, 07/24/2023); and REMOVAL, TISSUE EXPANDER, BREAST, BILATERAL, WITH BILATERAL IMPLANT IMPLANT INSERTION (09/11/2023). Family History: family history includes Alcohol abuse in her father; Breast cancer in her maternal aunt and mother; Diabetes in her father, maternal aunt, and mother; High blood pressure (Hypertension) in her brother, maternal aunt, and mother. Social History:  reports that she has never smoked. She has never used smokeless tobacco. She reports that she does not currently use alcohol. She reports that she does not use drugs. OB/GYN History:  OB History    Gravida 3  Para 2  Term 2  Preterm    AB 1  Living      SAB    IAB    Ectopic 1  Molar    Multiple    Live Births         Allergies: has no known allergies. Medications:  Current Outpatient Medications:    sennosides (SENOKOT) 8.6 mg tablet, Take 17.2 mg by mouth, Disp: , Rfl:    tranexamic acid (LYSTEDA) 650 mg tablet, Take 2 tablets (1,300 mg total) by mouth 3 (three) times daily Take for a maximum of 4-5 days during monthly menstruation., Disp: 30 tablet, Rfl: 1  Review of Systems: No SOB, no palpitations or chest pain, no new lower extremity edema, no nausea or vomiting or bowel or bladder complaints. See HPI for gyn specific ROS.   Exam:    BP 117/78   Pulse 106    Ht 157.5 cm (5' 2)   Wt 65.7 kg (144 lb 12.8 oz)   LMP 01/10/2024 (Exact Date)   BMI 26.48 kg/m   General: Patient is well-groomed, well-nourished, appears stated age in no acute distress   HEENT: head is atraumatic and normocephalic, trachea is midline, neck is supple with no palpable nodules   CV: Regular rhythm and normal heart rate, no murmur   Pulm: Clear to auscultation throughout lung fields with no wheezing, crackles, or rhonchi. 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   Caprini score: 5 - plan for 7 days lovenox  postop  The patient and I discussed the technical aspects of the procedure including the potential for risks and complications.  These include but are not limited to the risk of infection requiring post-operative antibiotics or further procedures.  We talked about the risk of injury to adjacent organs including bladder, bowel, ureter, blood vessels or nerves.  We talked about the need to convert to an open incision.  We talked about the possible need for blood transfusion.  We talked about postop complications such as thromboembolic or cardiopulmonary complications.  All of her questions were answered.  Her preoperative exam was completed and the appropriate consents were signed. She is scheduled to undergo this procedure in the near future.  [name], MD

## 2024-01-20 ENCOUNTER — Other Ambulatory Visit: Payer: Self-pay

## 2024-01-21 ENCOUNTER — Inpatient Hospital Stay
Admission: RE | Admit: 2024-01-21 | Discharge: 2024-01-21 | Attending: Obstetrics and Gynecology | Admitting: Obstetrics and Gynecology

## 2024-01-21 ENCOUNTER — Other Ambulatory Visit: Payer: Self-pay

## 2024-01-21 NOTE — Patient Instructions (Addendum)
 Your procedure is scheduled on:  [facility] DECEMBER 17  Report to the Registration Desk on the 1st floor of the [facility] To find out your arrival time, please call [phone] between 1PM - 3PM on:  TUESDAY  DECEMBER 16  If your arrival time is 6:00 am, do not arrive before that time as the [facility] entrance doors do not open until 6:00 am.  REMEMBER: Instructions that are not followed completely may result in serious medical risk, up to and including death; or upon the discretion of your surgeon and anesthesiologist your surgery may need to be rescheduled.  Do not eat food after midnight the night before surgery.  No gum chewing or hard candies.  You may however, drink CLEAR liquids up to 2 hours before you are scheduled to arrive for your surgery. Do not drink anything within 2 hours of your scheduled arrival time.  Clear liquids include: - water   - apple juice without pulp - gatorade (not RED colors) - black coffee or tea (Do NOT add milk or creamers to the coffee or tea) Do NOT drink anything that is not on this list.   One week prior to surgery: Stop Anti-inflammatories (NSAIDS) such as Advil , Aleve, Ibuprofen , Motrin , Naproxen, Naprosyn and Aspirin based products such as Excedrin, Goody's Powder, BC Powder. Stop ANY OVER THE COUNTER supplements until after surgery.  You may however, continue to take Tylenol  if needed for pain up until the day of surgery.  Continue taking all of your other prescription medications up until the day of surgery.  ON THE DAY OF SURGERY DO NOT TAKE ANY MEDICATIONS   No Alcohol for 24 hours before or after surgery.  Do not use any recreational drugs for at least a week (preferably 2 weeks) before your surgery.  Please be advised that the combination of cocaine and anesthesia may have negative outcomes, up to and including death. If you test positive for cocaine, your surgery will be cancelled.  On the morning of surgery brush your  teeth with toothpaste and water , you may rinse your mouth with mouthwash if you wish. Do not swallow any toothpaste or mouthwash.  Use CHG Soap as directed on instruction sheet.  Do not wear jewelry, make-up, hairpins, clips or nail polish.  For welded (permanent) jewelry: bracelets, anklets, waist bands, etc.  Please have this removed prior to surgery.  If it is not removed, there is a chance that hospital personnel will need to cut it off on the day of surgery.  Do not wear lotions, powders, or perfumes.   Do not shave body hair from the neck down 48 hours before surgery.  Do not bring valuables to the hospital. [facility] is not responsible for any missing/lost belongings or valuables.   Notify your doctor if there is any change in your medical condition (cold, fever, infection).  Wear comfortable clothing (specific to your surgery type) to the hospital.  After surgery, you can help prevent lung complications by doing breathing exercises.  Take deep breaths and cough every 1-2 hours. Your doctor may order a device called an Incentive Spirometer to help you take deep breaths.  When coughing or sneezing, hold a pillow firmly against your incision with both hands. This is called splinting. Doing this helps protect your incision. It also decreases belly discomfort.  If you are being admitted to the hospital overnight, leave your suitcase in the car. After surgery it may be brought to your room.  In case of  increased patient census, it may be necessary for you, the patient, to continue your postoperative care in the Same Day Surgery department.  If you are being discharged the day of surgery, you will not be allowed to drive home. You will need a responsible individual to drive you home and stay with you for 24 hours after surgery.   If you are taking public transportation, you will need to have a responsible individual with you.  Please call the Pre-admissions Testing Dept. at  [phone] if you have any questions about these instructions.  Surgery Visitation Policy:  Patients having surgery or a procedure may have two visitors.  Children under the age of 69 must have an adult with them who is not the patient.   Merchandiser, Retail to address health-related social needs:  [email or web address]                                                                                                             Preparing for Surgery with CHLORHEXIDINE  GLUCONATE (CHG) Soap  Chlorhexidine  Gluconate (CHG) Soap  o An antiseptic cleaner that kills germs and bonds with the skin to continue killing germs even after washing  o Used for showering the night before surgery and morning of surgery  Before surgery, you can play an important role by reducing the number of germs on your skin.  CHG (Chlorhexidine  gluconate) soap is an antiseptic cleanser which kills germs and bonds with the skin to continue killing germs even after washing.  Please do not use if you have an allergy to CHG or antibacterial soaps. If your skin becomes reddened/irritated stop using the CHG.  1. Shower the NIGHT BEFORE SURGERY with CHG soap.  2. If you choose to wash your hair, wash your hair first as usual with your normal shampoo.  3. After shampooing, rinse your hair and body thoroughly to remove the shampoo.  4. Use CHG as you would any other liquid soap. You can apply CHG directly to the skin and wash gently with a clean washcloth.  5. Apply the CHG soap to your body only from the neck down. Do not use on open wounds or open sores. Avoid contact with your eyes, ears, mouth, and genitals (private parts). Wash face and genitals (private parts) with your normal soap.  6. Wash thoroughly, paying special attention to the area where your surgery will be performed.  7. Thoroughly rinse your body with warm water .  8. Do not shower/wash with your normal soap after using and  rinsing off the CHG soap.  9. Do not use lotions, oils, etc., after showering with CHG.  10. Pat yourself dry with a clean towel.  11. Wear clean pajamas to bed the night before surgery.  12. Place clean sheets on your bed the night of your shower and do not sleep with pets.  13. Do not apply any deodorants/lotions/powders.  14. Please wear clean clothes to the hospital.  15. Remember to brush your teeth with your regular toothpaste.

## 2024-01-23 ENCOUNTER — Telehealth: Payer: Self-pay

## 2024-01-23 ENCOUNTER — Encounter: Payer: Self-pay | Admitting: Oncology

## 2024-01-23 ENCOUNTER — Inpatient Hospital Stay

## 2024-01-23 ENCOUNTER — Inpatient Hospital Stay: Attending: Oncology

## 2024-01-23 ENCOUNTER — Other Ambulatory Visit: Payer: Self-pay

## 2024-01-23 ENCOUNTER — Inpatient Hospital Stay: Attending: Oncology | Admitting: Oncology

## 2024-01-23 ENCOUNTER — Ambulatory Visit

## 2024-01-23 ENCOUNTER — Encounter
Admission: RE | Admit: 2024-01-23 | Discharge: 2024-01-23 | Disposition: A | Source: Ambulatory Visit | Attending: Obstetrics and Gynecology | Admitting: Obstetrics and Gynecology

## 2024-01-23 VITALS — BP 113/68 | HR 52

## 2024-01-23 VITALS — BP 115/84 | HR 55 | Temp 98.6°F | Resp 20 | Wt 139.7 lb

## 2024-01-23 DIAGNOSIS — Z803 Family history of malignant neoplasm of breast: Secondary | ICD-10-CM | POA: Insufficient documentation

## 2024-01-23 DIAGNOSIS — Z171 Estrogen receptor negative status [ER-]: Secondary | ICD-10-CM | POA: Diagnosis not present

## 2024-01-23 DIAGNOSIS — Z5112 Encounter for antineoplastic immunotherapy: Secondary | ICD-10-CM

## 2024-01-23 DIAGNOSIS — Z1722 Progesterone receptor negative status: Secondary | ICD-10-CM | POA: Insufficient documentation

## 2024-01-23 DIAGNOSIS — Z15060 Genetic susceptibility to colorectal cancer: Secondary | ICD-10-CM | POA: Diagnosis not present

## 2024-01-23 DIAGNOSIS — Z1505 Genetic susceptibility to malignant neoplasm of fallopian tube(s): Secondary | ICD-10-CM | POA: Diagnosis not present

## 2024-01-23 DIAGNOSIS — D508 Other iron deficiency anemias: Secondary | ICD-10-CM | POA: Diagnosis not present

## 2024-01-23 DIAGNOSIS — Z86711 Personal history of pulmonary embolism: Secondary | ICD-10-CM

## 2024-01-23 DIAGNOSIS — C50312 Malignant neoplasm of lower-inner quadrant of left female breast: Secondary | ICD-10-CM | POA: Insufficient documentation

## 2024-01-23 DIAGNOSIS — Z95828 Presence of other vascular implants and grafts: Secondary | ICD-10-CM

## 2024-01-23 DIAGNOSIS — Z7901 Long term (current) use of anticoagulants: Secondary | ICD-10-CM | POA: Diagnosis not present

## 2024-01-23 DIAGNOSIS — Z1502 Genetic susceptibility to malignant neoplasm of ovary: Secondary | ICD-10-CM | POA: Insufficient documentation

## 2024-01-23 DIAGNOSIS — Z90710 Acquired absence of both cervix and uterus: Secondary | ICD-10-CM | POA: Insufficient documentation

## 2024-01-23 DIAGNOSIS — Z1732 Human epidermal growth factor receptor 2 negative status: Secondary | ICD-10-CM | POA: Diagnosis not present

## 2024-01-23 DIAGNOSIS — Z9079 Acquired absence of other genital organ(s): Secondary | ICD-10-CM | POA: Diagnosis not present

## 2024-01-23 DIAGNOSIS — Z1589 Genetic susceptibility to other disease: Secondary | ICD-10-CM

## 2024-01-23 DIAGNOSIS — Z1501 Genetic susceptibility to malignant neoplasm of breast: Secondary | ICD-10-CM

## 2024-01-23 DIAGNOSIS — C50919 Malignant neoplasm of unspecified site of unspecified female breast: Secondary | ICD-10-CM

## 2024-01-23 DIAGNOSIS — Z15068 Genetic susceptibility to other malignant neoplasm of digestive system: Secondary | ICD-10-CM | POA: Diagnosis not present

## 2024-01-23 DIAGNOSIS — Z1509 Genetic susceptibility to other malignant neoplasm: Secondary | ICD-10-CM | POA: Diagnosis not present

## 2024-01-23 DIAGNOSIS — D500 Iron deficiency anemia secondary to blood loss (chronic): Secondary | ICD-10-CM

## 2024-01-23 DIAGNOSIS — Z86018 Personal history of other benign neoplasm: Secondary | ICD-10-CM | POA: Diagnosis not present

## 2024-01-23 DIAGNOSIS — Z90722 Acquired absence of ovaries, bilateral: Secondary | ICD-10-CM | POA: Diagnosis not present

## 2024-01-23 DIAGNOSIS — Z800 Family history of malignant neoplasm of digestive organs: Secondary | ICD-10-CM | POA: Insufficient documentation

## 2024-01-23 DIAGNOSIS — Z9882 Breast implant status: Secondary | ICD-10-CM | POA: Insufficient documentation

## 2024-01-23 DIAGNOSIS — Z9013 Acquired absence of bilateral breasts and nipples: Secondary | ICD-10-CM | POA: Insufficient documentation

## 2024-01-23 DIAGNOSIS — Z8701 Personal history of pneumonia (recurrent): Secondary | ICD-10-CM | POA: Insufficient documentation

## 2024-01-23 DIAGNOSIS — N939 Abnormal uterine and vaginal bleeding, unspecified: Secondary | ICD-10-CM

## 2024-01-23 DIAGNOSIS — Z801 Family history of malignant neoplasm of trachea, bronchus and lung: Secondary | ICD-10-CM | POA: Diagnosis not present

## 2024-01-23 DIAGNOSIS — I2699 Other pulmonary embolism without acute cor pulmonale: Secondary | ICD-10-CM

## 2024-01-23 LAB — CMP (CANCER CENTER ONLY)
ALT: 35 U/L (ref 0–44)
AST: 26 U/L (ref 15–41)
Albumin: 4 g/dL (ref 3.5–5.0)
Alkaline Phosphatase: 107 U/L (ref 38–126)
Anion gap: 9 (ref 5–15)
BUN: 10 mg/dL (ref 6–20)
CO2: 24 mmol/L (ref 22–32)
Calcium: 9.1 mg/dL (ref 8.9–10.3)
Chloride: 106 mmol/L (ref 98–111)
Creatinine: 0.63 mg/dL (ref 0.44–1.00)
GFR, Estimated: >60 mL/min (ref >60)
Glucose, Bld: 83 mg/dL (ref 70–99)
Potassium: 4.1 mmol/L (ref 3.5–5.1)
Sodium: 139 mmol/L (ref 135–145)
Total Bilirubin: 0.4 mg/dL (ref 0.0–1.2)
Total Protein: 7 g/dL (ref 6.5–8.1)

## 2024-01-23 LAB — TYPE AND SCREEN
ABO/RH(D): B POS
Antibody Screen: NEGATIVE

## 2024-01-23 LAB — CBC WITH DIFFERENTIAL (CANCER CENTER ONLY)
Abs Immature Granulocytes: 0.01 K/uL (ref 0.00–0.07)
Basophils Absolute: 0 K/uL (ref 0.0–0.1)
Basophils Relative: 0 %
Eosinophils Absolute: 0 K/uL (ref 0.0–0.5)
Eosinophils Relative: 0 %
HCT: 33 % — ABNORMAL LOW (ref 36.0–46.0)
Hemoglobin: 10.3 g/dL — ABNORMAL LOW (ref 12.0–15.0)
Immature Granulocytes: 0 %
Lymphocytes Relative: 30 %
Lymphs Abs: 1.2 K/uL (ref 0.7–4.0)
MCH: 24.8 pg — ABNORMAL LOW (ref 26.0–34.0)
MCHC: 31.2 g/dL (ref 30.0–36.0)
MCV: 79.5 fL — ABNORMAL LOW (ref 80.0–100.0)
Monocytes Absolute: 0.3 K/uL (ref 0.1–1.0)
Monocytes Relative: 7 %
Neutro Abs: 2.5 K/uL (ref 1.7–7.7)
Neutrophils Relative %: 63 %
Platelet Count: 222 K/uL (ref 150–400)
RBC: 4.15 MIL/uL (ref 3.87–5.11)
RDW: 17.4 % — ABNORMAL HIGH (ref 11.5–15.5)
WBC Count: 3.9 K/uL — ABNORMAL LOW (ref 4.0–10.5)
nRBC: 0 % (ref 0.0–0.2)

## 2024-01-23 MED ORDER — IRON SUCROSE 20 MG/ML IV SOLN
200.0000 mg | Freq: Once | INTRAVENOUS | Status: AC
  Administered 2024-01-23: 200 mg via INTRAVENOUS

## 2024-01-23 MED ORDER — SODIUM CHLORIDE 0.9 % IV SOLN
200.0000 mg | Freq: Once | INTRAVENOUS | Status: AC
  Administered 2024-01-23: 200 mg via INTRAVENOUS
  Filled 2024-01-23: qty 200

## 2024-01-23 MED ORDER — ALTEPLASE 2 MG IJ SOLR
2.0000 mg | Freq: Once | INTRAMUSCULAR | Status: AC
  Administered 2024-01-23: 2 mg
  Filled 2024-01-23: qty 2

## 2024-01-23 MED ORDER — SODIUM CHLORIDE 0.9 % IV SOLN
INTRAVENOUS | Status: DC
  Filled 2024-01-23: qty 250

## 2024-01-23 NOTE — Progress Notes (Signed)
 Hematology/Oncology Progress note [phone] [phone]       CHIEF COMPLAINTS/PURPOSE OF CONSULTATION:  Left triple negative breast cancer.   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  S/p neoadjuvant chemotherapy with Carboplatin , taxol  Keytruda  Q3 weeks x 4 cycle  followed by [facility] + Keytrua with D4 GCSF x 4 cycles.  S/p bilateral mastectomy. no residual disease. ypT0 ypN0 (sn)  --> reconstruction in progress. S/p adjuvant RT  Patient has BRCA 1 mutation, no residual disease, does not qualify Olarparib eligibility.   Recommend adjuvant Keytrya every 3 weeks, plan for 9 cycles.  Labs are reviewed and discussed with patient. Proceed with cycle 5 Keytruda .    BRCA1 gene mutation positive Once she finished treatment of breast cancer, recommend risk-reducing salpingo oophorectomy- planned in December 2025 pancreatic screening at age 70 (or 10 years younger than the earliest exocrine pancreatic cancer diagnosis in the family, whichever is earlier)  Family members are encouraged to consider genetic testing for this familial pathogenic variant.   Encounter for antineoplastic immunotherapy Immunotherapy plan as listed above  Hypocalcemia Recommend OTC calcium  1200mg  and Vitamin D 1000 units supplementation  IDA (iron  deficiency anemia) Labs are reviewed and discussed with patient. Lab Results  Component Value Date   HGB 10.3 (L) 01/23/2024   TIBC 417 12/12/2023   IRONPCTSAT 6 (L) 12/12/2023   FERRITIN 5 (L) 12/12/2023   Plan  IV Venofer  treatments x 4  Port-A-Cath in place No blood return. Cathflow incubation.  If not working plan dye study  Orders Placed This Encounter  Procedures    CBC with Differential (Cancer Center Only)    Standing Status:   Future    Expected Date:   03/12/2024    Expiration Date:   03/12/2025   CMP (Cancer Center only)    Standing Status:   Future    Expected Date:   03/12/2024    Expiration Date:   03/12/2025   Follow up 3 weeks.  All questions were answered. The patient knows to call the clinic with any problems, questions or concerns.  Call Babara, MD, PhD [facility] Hematology Oncology 01/23/2024    HISTORY OF PRESENTING ILLNESS:  Penny Gomez 38 y.o. female presents to establish care for left triple negative breast cancer I have reviewed her chart and materials related to her cancer extensively and collaborated history with the patient. Summary of oncologic history is as follows: Oncology History  Invasive carcinoma of breast (HCC)  07/24/2022 Mammogram   She noticed left breast mass for 1 month.  Bilateral diagnostic mammogram showed Suspicious palpable left breast mass 8 o'clock position. Cortically thickened left axillary lymph node   10/10/2022 Initial Diagnosis   Invasive carcinoma of breast (HCC)  10/02/2022  Left breast mass biopsy and left axillary lymph node biopsy.  Diagnosis 1. Breast, left, needle core biopsy, 8 o'clock 6 cmfn, heart clip - INVASIVE MAMMARY CARCINOMA, NO SPECIAL TYPE. - TUBULE FORMATION: SCORE 3 - NUCLEAR PLEOMORPHISM: SCORE 3 - MITOTIC COUNT: SCORE 3 - TOTAL SCORE: 9 - OVERALL GRADE: 3 - LYMPHOVASCULAR INVASION: NOT IDENTIFIED - CANCER LENGTH: 11 MM - CALCIFICATIONS: NOT IDENTIFIED - DUCTAL CARCINOMA IN SITU: PRESENT, HIGH-GRADE - ER-, PR- HER2 - [IHC 1+] 2.  Lymph node, needle/core biopsy, left axillary, hydromark - LYMPH NODE WITH REACTIVE CHANGES; NEGATIVE FOR MALIGNANCY.  Menarche at age of 86 First live birth at age of 52 OCP use: no History of hysterectomy: no Menopausal status: premenopausal History of HRT use: no History of chest radiation: no Number of previous breast biopsies:   right breast biopsy 05/20/2019 negative for malignancy Strong family history of cancer mother breast cancer diagnosed in 39s, maternal aunts x 2 breast cancer, maternal cousins breast cancer x 2, maternal grandmother pancreatic cancer.    10/10/2022 Cancer Staging   Staging form: Breast, AJCC 8th Edition - Clinical stage from 10/10/2022: Stage IIIB (cT3, cN0, cM0, G3, ER-, PR-, HER2-) - Signed by Babara Call, MD on 10/18/2022 Stage prefix: Initial diagnosis Nuclear grade: G3 Histologic grading system: 3 grade system   10/17/2022 Imaging   Bilateral MRI breasts w wo contrast  1. In total, there is approximately 6.7 cm of abnormal enhancement, predominantly in the lower-inner inferior left breast, with the ultrasound-guided biopsy marking clip centrally positioned within the enhancement. There are scattered small enhancing masses in the lower inner and lower outer anterior left (included in the measurement above).   2.  No evidence of right breast malignancy.   3. The axillary lymph nodes are not included within the field of view of this MRI for adequate assessment of lymphadenopathy.    10/17/2022 Procedure   S/p medi port placement.    10/25/2022 - 10/25/2022 Chemotherapy   Patient is on Treatment Plan : BREAST ADJUVANT DOSE DENSE AC q14d / PACLitaxel  q7d     10/25/2022 - 05/19/2023 Chemotherapy   Patient is on Treatment Plan : BREAST Pembrolizumab  (200) D1 + Carboplatin  (5) D1 + Paclitaxel  (80) D1,8,15 q21d X 4 cycles / Pembrolizumab  (200) D1 + AC D1 q21d x 4 cycles      Genetic Testing   Single pathogenic variant in BRCA1 called c.190T>G identified on the [facility] Multi-Cancer+RNA panel. The remainder of testing was normal. The report date is 11/11/2022.  The Multi-Cancer + RNA Panel offered by [facility] includes sequencing and/or deletion/duplication analysis of the following 70 genes:  AIP*, ALK, APC*, ATM*, AXIN2*, BAP1*, BARD1*, BLM*, BMPR1A*, BRCA1*, BRCA2*, BRIP1*, CDC73*, CDH1*, CDK4, CDKN1B*,  CDKN2A, CHEK2*, CTNNA1*, DICER1*, EPCAM, EGFR, FH*, FLCN*, GREM1, HOXB13, KIT, LZTR1, MAX*, MBD4, MEN1*, MET, MITF, MLH1*, MSH2*, MSH3*, MSH6*, MUTYH*, NF1*, NF2*, NTHL1*, PALB2*, PDGFRA, PMS2*, POLD1*, POLE*, POT1*, PRKAR1A*, PTCH1*, PTEN*, RAD51C*, RAD51D*, RB1*, RET, SDHA*, SDHAF2*, SDHB*, SDHC*, SDHD*, SMAD4*, SMARCA4*, SMARCB1*, SMARCE1*, STK11*, SUFU*, TMEM127*, TP53*, TSC1*, TSC2*, VHL*. RNA analysis is performed for * genes.   11/24/2022 - 11/25/2022 Hospital Admission   Admitted due to acute pulmonary embolism, lower extremity US  negative for DVT.  Patietn was started on heparin  gtt and transitioned to Eliquis  at discharge.    02/10/2023 Procedure   S/p port placement.    03/10/2023 Imaging   MRI bilateral breast w wo contrast  No abnormal enhancement in the left breast consistent with response to neoadjuvant chemotherapy.   07/24/2023 Surgery   Patient underwent bilateral mastectomy 1. Breast, simple mastectomy, left, long stitch lateral, short stitch superior :      - CHANGES CONSISTENT WITH NEOADJUVANT THERAPY, NEGATIVE FOR RESIDUAL MALIGNANCY.      - SEE NOTE.      - SCLEROSING ADENOSIS AND SECRETORY CHANGE.      - BIOPSY SITE CHANGE WITH CLIP.      - UNREMARKABLE NIPPLE AND SKIN.       2. Breast, simple  mastectomy, right, long stitch lateral, short stitch superior :      - SCLEROSING ADENOSIS, FIBROUS STROMA, AND FIBROADENOMATOID CHANGE.      - UNREMARKABLE NIPPLE AND SKIN.      - NEGATIVE FOR ATYPIA AND MALIGNANCY.       3. Lymph node, sentinel, biopsy, left axillary #1 :      - TWO LYMPH NODES NEGATIVE FOR MALIGNANCY (0/2).      - SEE NOTE.      - BIOPSY SITE CHANGE INVOLVING ONE LYMPH NODE.      - FIBROUS SCARRING / CHANGES SUGGESTIVE OF NEOADJUVANT THERAPY ARE NOT      IDENTIFIED.       4. Breast, excision, left lateral margin :      - UNREMARKABLE BREAST TISSUE.      - NEGATIVE FOR ATYPIA AND MALIGNANCY.  The findings in the left simple mastectomy (specimen 1)  are       consistent with a complete pathologic response to neoadjuvant therapy.Based on       these findings and those in the left axillary sentinel lymph node excision       (specimen 3) the AJCC staging is ypT0 ypN0 (sn)     08/22/2023 -  Chemotherapy   Patient is on Treatment Plan : BREAST Pembrolizumab  (200) q21d x 27 weeks      Today she presents for evaluation prior to immunotherapy.  S/p bilateral mastectomy, ongoing plastic surgery  for reconstruction. S/p post mastectomy radiation.  She reports no new complaints. There is plan of hysterectomy in December   MEDICAL HISTORY:  Past Medical History:  Diagnosis Date   Acute pulmonary embolism (HCC)    BRCA1 gene mutation positive    Breast lump    Chronic anticoagulation    Endocarditis    Fibroid, uterine    Immunosuppressed due to chemotherapy    Microcytic anemia    MSSA bacteremia    Multifocal pneumonia    Sepsis (HCC)    due to port a cath   STD (sexually transmitted disease)    From Medical Hx;   Thrombocytopenia    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   Vascular port complication     SURGICAL HISTORY: Past Surgical History:  Procedure Laterality Date   BREAST BIOPSY Right 05/20/2019   Affirm Bx- X- clip, neg   BREAST BIOPSY Left 10/02/2022   US  Bx, path pending   BREAST BIOPSY Left 10/02/2022   Us  Bx Node- path pending   BREAST BIOPSY Left 10/02/2022   US  LT BREAST BX W LOC DEV 1ST LESION IMG BX SPEC US  GUIDE 10/02/2022 [facility]   BREAST RECONSTRUCTION Bilateral 07/24/2023   Procedure: RECONSTRUCTION, BREAST;  Surgeon: Lowery Estefana RAMAN, DO;  Location: [facility];  Service: Plastics;  Laterality: Bilateral;  BILATERAL IMMEDIATE BREAST RECONSTRUCTION WITH EXPANDER AND FLEX HD PLACEMENT   CESAREAN SECTION     x 2   LAPAROSCOPIC BILATERAL SALPINGECTOMY Bilateral 02/09/2022   Procedure: LAPAROSCOPIC BILATERAL SALPINGECTOMY;  Surgeon: Connell Davies, MD;  Location: [facility];  Service:  Gynecology;  Laterality: Bilateral;   MASTECTOMY W/ SENTINEL NODE BIOPSY Bilateral 07/24/2023   Procedure: MASTECTOMY WITH SENTINEL LYMPH NODE BIOPSY;  Surgeon: Tye Millet, DO;  Location: [facility];  Service: General;  Laterality: Bilateral;  SN biopsy left only   PORTACATH PLACEMENT Right 10/17/2022   Procedure: INSERTION PORT-A-CATH;  Surgeon: Desiderio Schanz, MD;  Location: [facility];  Service: General;  Laterality: Right;   PORTACATH  PLACEMENT Left 02/10/2023   Procedure: INSERTION PORT-A-CATH;  Surgeon: Tye Millet, DO;  Location: [facility];  Service: General;  Laterality: Left;   REMOVAL OF BILATERAL TISSUE EXPANDERS WITH PLACEMENT OF BILATERAL BREAST IMPLANTS Bilateral 09/11/2023   Procedure: REMOVAL, TISSUE EXPANDER, BREAST, BILATERAL, WITH BILATERAL IMPLANT IMPLANT INSERTION;  Surgeon: Lowery Estefana RAMAN, DO;  Location: [facility];  Service: Plastics;  Laterality: Bilateral;  right expander removal and replacement.   TEE WITHOUT CARDIOVERSION N/A 01/02/2023   Procedure: TRANSESOPHAGEAL ECHOCARDIOGRAM (TEE);  Surgeon: Perla Evalene PARAS, MD;  Location: [facility];  Service: Cardiovascular;  Laterality: N/A;   TUBAL LIGATION      SOCIAL HISTORY: Social History   Socioeconomic History   Marital status: Single    Spouse name: Not on file   Number of children: 2   Years of education: Not on file   Highest education level: High school graduate  Occupational History   Not on file  Tobacco Use   Smoking status: Never    Passive exposure: Never   Smokeless tobacco: Never  Vaping Use   Vaping status: Never Used  Substance and Sexual Activity   Alcohol use: Never   Drug use: No   Sexual activity: Yes    Birth control/protection: Surgical  Other Topics Concern   Not on file  Social History Narrative   Lives with mom and 2 kids   Social Drivers of Health   Tobacco Use: Low Risk (01/23/2024)   Patient History    Smoking Tobacco Use: Never    Smokeless Tobacco Use:  Never    Passive Exposure: Never  Financial Resource Strain: Low Risk  (11/21/2023)   Received from [facility]   Overall Financial Resource Strain (CARDIA)    Difficulty of Paying Living Expenses: Not hard at all  Food Insecurity: No Food Insecurity (11/21/2023)   Received from [facility]   Epic    Within the past 12 months, you worried that your food would run out before you got the money to buy more.: Never true    Within the past 12 months, the food you bought just didn't last and you didn't have money to get more.: Never true  Transportation Needs: No Transportation Needs (11/21/2023)   Received from [facility] - Transportation    In the past 12 months, has lack of transportation kept you from medical appointments or from getting medications?: No    Lack of Transportation (Non-Medical): No  Physical Activity: Not on file  Stress: No Stress Concern Present (10/10/2022)   [employer] - Occupational Stress Questionnaire    Feeling of Stress : Only a little  Social Connections: Not on file  Intimate Partner Violence: Not At Risk (12/31/2022)   Humiliation, Afraid, Rape, and Kick questionnaire    Fear of Current or Ex-Partner: No    Emotionally Abused: No    Physically Abused: No    Sexually Abused: No  Depression (PHQ2-9): Low Risk (01/02/2024)   Depression (PHQ2-9)    PHQ-2 Score: 0  Alcohol Screen: Not on file  Housing: Low Risk  (11/21/2023)   Received from [facility]   Epic    In the last 12 months, was there a time when you were not able to pay the mortgage or rent on time?: No    In the past 12 months, how many times have you moved where you were living?: 0  At any time in the past 12 months, were you homeless or living in a shelter (including now)?: No  Utilities: Not At Risk (11/21/2023)   Received from [facility]   Epic    In the past  12 months has the electric, gas, oil, or water  company threatened to shut off services in your home?: No  Health Literacy: Adequate Health Literacy (10/10/2022)   B1300 Health Literacy    Frequency of need for help with medical instructions: Never    FAMILY HISTORY: Family History  Problem Relation Age of Onset   Breast cancer Mother 21       no GT   Breast cancer Maternal Aunt 33   Breast cancer Maternal Aunt    Lung cancer Maternal Uncle    Lung cancer Maternal Uncle    Bone cancer Paternal Aunt    Pancreatic cancer Maternal Grandmother 62   Lung cancer Maternal Grandfather 57   Breast cancer Cousin 40   Breast cancer Other 33       gene pos?    ALLERGIES:  has no known allergies.  MEDICATIONS:  Current Outpatient Medications  Medication Sig Dispense Refill   Calcium -Phosphorus-Vitamin D (CALCIUM  GUMMIES PO) Take 2 each by mouth in the morning.     senna (SENOKOT) 8.6 MG TABS tablet Take 2 tablets (17.2 mg total) by mouth daily. 120 tablet 2   No current facility-administered medications for this visit.   Facility-Administered Medications Ordered in Other Visits  Medication Dose Route Frequency Provider Last Rate Last Admin   0.9 %  sodium chloride  infusion   Intravenous Continuous Babara Call, MD 10 mL/hr at 01/23/24 1006 New Bag at 01/23/24 1006   pembrolizumab  (KEYTRUDA ) 200 mg in sodium chloride  0.9 % 50 mL chemo infusion  200 mg Intravenous Once Babara Call, MD        Review of Systems  Constitutional:  Negative for appetite change, chills, fatigue and fever.  HENT:   Negative for hearing loss and voice change.   Eyes:  Negative for eye problems.  Respiratory:  Negative for chest tightness and cough.   Cardiovascular:  Negative for chest pain.  Gastrointestinal:  Negative for abdominal distention, abdominal pain and blood in stool.  Endocrine: Negative for hot flashes.  Genitourinary:  Negative for difficulty urinating and frequency.   Musculoskeletal:  Negative for  arthralgias.  Skin:  Negative for itching and rash.  Neurological:  Negative for extremity weakness and headaches.  Hematological:  Negative for adenopathy.  Psychiatric/Behavioral:  Negative for confusion.      PHYSICAL EXAMINATION: ECOG PERFORMANCE STATUS: 0 - Asymptomatic  Vitals:   01/23/24 0927  BP: 115/84  Pulse: (!) 55  Resp: 20  Temp: 98.6 F (37 C)  SpO2: 100%   Filed Weights   01/23/24 0927  Weight: 139 lb 11.2 oz (63.4 kg)    Physical Exam Constitutional:      General: She is not in acute distress.    Appearance: She is not diaphoretic.  HENT:     Head: Normocephalic and atraumatic.  Eyes:     General: No scleral icterus. Cardiovascular:     Rate and Rhythm: Normal rate and regular rhythm.  Pulmonary:     Effort: Pulmonary effort is normal. No respiratory distress.     Breath sounds: No wheezing.  Abdominal:     General: Bowel sounds are normal. There is no distension.     Palpations: Abdomen is soft.  Musculoskeletal:  General: Normal range of motion.     Cervical back: Normal range of motion and neck supple.  Skin:    General: Skin is warm and dry.     Findings: No erythema.     Comments: Left anterior chest wall medi port  Neurological:     Mental Status: She is alert and oriented to person, place, and time. Mental status is at baseline.     Motor: No abnormal muscle tone.  Psychiatric:        Mood and Affect: Mood and affect normal.     LABORATORY DATA:  I have reviewed the data as listed    Latest Ref Rng & Units 01/23/2024    8:53 AM 01/02/2024    8:12 AM 12/12/2023    8:14 AM  CBC  WBC 4.0 - 10.5 K/uL 3.9  4.3  5.0   Hemoglobin 12.0 - 15.0 g/dL 89.6  89.7  89.9   Hematocrit 36.0 - 46.0 % 33.0  32.6  32.4   Platelets 150 - 400 K/uL 222  202  215       Latest Ref Rng & Units 01/23/2024    8:53 AM 01/02/2024    8:12 AM 12/12/2023    8:14 AM  CMP  Glucose 70 - 99 mg/dL 83  79  897   BUN 6 - 20 mg/dL 10  9  12     Creatinine 0.44 - 1.00 mg/dL 9.36  9.36  9.36   Sodium 135 - 145 mmol/L 139  137  137   Potassium 3.5 - 5.1 mmol/L 4.1  3.6  4.0   Chloride 98 - 111 mmol/L 106  107  108   CO2 22 - 32 mmol/L 24  22  21    Calcium  8.9 - 10.3 mg/dL 9.1  8.7  8.7   Total Protein 6.5 - 8.1 g/dL 7.0  7.0  6.7   Total Bilirubin 0.0 - 1.2 mg/dL 0.4  0.5  0.6   Alkaline Phos 38 - 126 U/L 107  83  80   AST 15 - 41 U/L 26  26  23    ALT 0 - 44 U/L 35  21  17      RADIOGRAPHIC STUDIES: I have personally reviewed the radiological images as listed and agreed with the findings in the report. No results found.

## 2024-01-23 NOTE — Assessment & Plan Note (Signed)
 No blood return. Cathflow incubation.  If not working plan dye study

## 2024-01-23 NOTE — Assessment & Plan Note (Addendum)
 Labs are reviewed and discussed with patient. Lab Results  Component Value Date   HGB 10.3 (L) 01/23/2024   TIBC 417 12/12/2023   IRONPCTSAT 6 (L) 12/12/2023   FERRITIN 5 (L) 12/12/2023   Plan  IV Venofer  treatments x 4

## 2024-01-23 NOTE — Assessment & Plan Note (Signed)
 Once she finished treatment of breast cancer, recommend risk-reducing salpingo oophorectomy- planned in December 2025 pancreatic screening at age 38 (or 9 years younger than the earliest exocrine pancreatic cancer diagnosis in the family, whichever is earlier)  Family members are encouraged to consider genetic testing for this familial pathogenic variant.

## 2024-01-23 NOTE — Assessment & Plan Note (Signed)
 Immunotherapy plan as listed above

## 2024-01-23 NOTE — Assessment & Plan Note (Signed)
 Recommend OTC calcium  1200mg  and Vitamin D 1000 units supplementation

## 2024-01-23 NOTE — Assessment & Plan Note (Addendum)
 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  S/p neoadjuvant chemotherapy with Carboplatin , taxol  Keytruda  Q3 weeks x 4 cycle  followed by [facility] + Keytrua with D4 GCSF x 4 cycles.  S/p bilateral mastectomy. no residual disease. ypT0 ypN0 (sn)  --> reconstruction in progress. S/p adjuvant RT  Patient has BRCA 1 mutation, no residual disease, does not qualify Olarparib eligibility.   Recommend adjuvant Keytrya every 3 weeks, plan for 9 cycles.  Labs are reviewed and discussed with patient. Proceed with cycle 5 Keytruda .

## 2024-01-23 NOTE — Telephone Encounter (Signed)
 Patient no blood return via [place] scheduled patient Tue 12/16 Check in [facility]. Patient aware ad verbalizes understanding

## 2024-01-24 LAB — T4: T4, Total: 6.7 ug/dL (ref 4.5–12.0)

## 2024-01-25 ENCOUNTER — Other Ambulatory Visit: Payer: Self-pay

## 2024-01-26 ENCOUNTER — Inpatient Hospital Stay

## 2024-01-27 ENCOUNTER — Ambulatory Visit: Payer: Self-pay | Admitting: Oncology

## 2024-01-27 ENCOUNTER — Ambulatory Visit
Admission: RE | Admit: 2024-01-27 | Discharge: 2024-01-27 | Disposition: A | Discharge status: Home / Self Care | Source: Ambulatory Visit | Attending: Oncology | Admitting: Oncology

## 2024-01-27 ENCOUNTER — Other Ambulatory Visit: Payer: Self-pay

## 2024-01-27 DIAGNOSIS — Z95828 Presence of other vascular implants and grafts: Secondary | ICD-10-CM

## 2024-01-27 DIAGNOSIS — C50919 Malignant neoplasm of unspecified site of unspecified female breast: Secondary | ICD-10-CM

## 2024-01-27 DIAGNOSIS — Z452 Encounter for adjustment and management of vascular access device: Secondary | ICD-10-CM | POA: Insufficient documentation

## 2024-01-27 DIAGNOSIS — I2699 Other pulmonary embolism without acute cor pulmonale: Secondary | ICD-10-CM | POA: Insufficient documentation

## 2024-01-27 HISTORY — PX: IR CV LINE INJECTION: IMG2294

## 2024-01-27 MED ORDER — POVIDONE-IODINE 10 % EX SWAB
2.0000 | Freq: Once | CUTANEOUS | Status: AC
  Administered 2024-01-28: 07:00:00 2 via TOPICAL

## 2024-01-27 MED ORDER — ENOXAPARIN SODIUM 40 MG/0.4ML IJ SOSY
40.0000 mg | PREFILLED_SYRINGE | INTRAMUSCULAR | Status: AC
  Administered 2024-01-28: 07:00:00 40 mg via SUBCUTANEOUS

## 2024-01-27 MED ORDER — HEPARIN SOD (PORK) LOCK FLUSH 100 UNIT/ML IV SOLN
INTRAVENOUS | Status: AC
  Filled 2024-01-27: qty 5

## 2024-01-27 MED ORDER — CHLORHEXIDINE GLUCONATE 0.12 % MT SOLN
15.0000 mL | Freq: Once | OROMUCOSAL | Status: AC
  Administered 2024-01-28: 07:00:00 15 mL via OROMUCOSAL

## 2024-01-27 MED ORDER — ORAL CARE MOUTH RINSE: 15.0000 mL | Freq: Once | OROMUCOSAL | Status: AC

## 2024-01-27 MED ORDER — IOHEXOL 300 MG/ML  SOLN
50.0000 mL | Freq: Once | INTRAMUSCULAR | Status: AC | PRN
  Administered 2024-01-27: 15:00:00 5 mL

## 2024-01-27 MED ORDER — LACTATED RINGERS IV SOLN: INTRAVENOUS | Status: DC

## 2024-01-27 MED ORDER — CEFAZOLIN SODIUM-DEXTROSE 2-4 GM/100ML-% IV SOLN
2.0000 g | INTRAVENOUS | Status: AC
  Administered 2024-01-28: 08:00:00 2 g via INTRAVENOUS

## 2024-01-27 MED ORDER — METRONIDAZOLE 500 MG PO TABS
500.0000 mg | ORAL_TABLET | ORAL | Status: DC
  Filled 2024-01-27: qty 1

## 2024-01-27 MED ORDER — HEPARIN SOD (PORK) LOCK FLUSH 100 UNIT/ML IV SOLN
500.0000 [IU] | Freq: Once | INTRAVENOUS | Status: AC
  Administered 2024-01-27: 15:00:00 500 [IU] via INTRAVENOUS

## 2024-01-28 ENCOUNTER — Encounter: Payer: Self-pay | Admitting: Oncology

## 2024-01-28 ENCOUNTER — Encounter: Admission: RE | Disposition: A | Payer: Self-pay | Source: Home / Self Care | Attending: Obstetrics and Gynecology

## 2024-01-28 ENCOUNTER — Ambulatory Visit

## 2024-01-28 ENCOUNTER — Other Ambulatory Visit: Payer: Self-pay

## 2024-01-28 ENCOUNTER — Encounter: Payer: Self-pay | Admitting: Obstetrics and Gynecology

## 2024-01-28 ENCOUNTER — Ambulatory Visit
Admission: RE | Admit: 2024-01-28 | Discharge: 2024-01-28 | Disposition: A | Source: Home / Self Care | Attending: Obstetrics and Gynecology | Admitting: Obstetrics and Gynecology

## 2024-01-28 DIAGNOSIS — N8312 Corpus luteum cyst of left ovary: Secondary | ICD-10-CM | POA: Insufficient documentation

## 2024-01-28 DIAGNOSIS — N888 Other specified noninflammatory disorders of cervix uteri: Secondary | ICD-10-CM | POA: Insufficient documentation

## 2024-01-28 DIAGNOSIS — Z1501 Genetic susceptibility to malignant neoplasm of breast: Secondary | ICD-10-CM | POA: Diagnosis present

## 2024-01-28 DIAGNOSIS — Z853 Personal history of malignant neoplasm of breast: Secondary | ICD-10-CM | POA: Insufficient documentation

## 2024-01-28 DIAGNOSIS — N8301 Follicular cyst of right ovary: Secondary | ICD-10-CM | POA: Diagnosis not present

## 2024-01-28 DIAGNOSIS — Z86711 Personal history of pulmonary embolism: Secondary | ICD-10-CM | POA: Insufficient documentation

## 2024-01-28 DIAGNOSIS — N8000 Endometriosis of the uterus, unspecified: Secondary | ICD-10-CM | POA: Diagnosis not present

## 2024-01-28 DIAGNOSIS — Z9013 Acquired absence of bilateral breasts and nipples: Secondary | ICD-10-CM | POA: Diagnosis not present

## 2024-01-28 DIAGNOSIS — N920 Excessive and frequent menstruation with regular cycle: Secondary | ICD-10-CM | POA: Diagnosis present

## 2024-01-28 DIAGNOSIS — N939 Abnormal uterine and vaginal bleeding, unspecified: Secondary | ICD-10-CM

## 2024-01-28 DIAGNOSIS — D259 Leiomyoma of uterus, unspecified: Secondary | ICD-10-CM | POA: Diagnosis not present

## 2024-01-28 DIAGNOSIS — N736 Female pelvic peritoneal adhesions (postinfective): Secondary | ICD-10-CM | POA: Diagnosis not present

## 2024-01-28 DIAGNOSIS — Z803 Family history of malignant neoplasm of breast: Secondary | ICD-10-CM | POA: Diagnosis not present

## 2024-01-28 DIAGNOSIS — Z15060 Genetic susceptibility to colorectal cancer: Secondary | ICD-10-CM

## 2024-01-28 DIAGNOSIS — N8311 Corpus luteum cyst of right ovary: Secondary | ICD-10-CM | POA: Diagnosis not present

## 2024-01-28 DIAGNOSIS — N8302 Follicular cyst of left ovary: Secondary | ICD-10-CM | POA: Insufficient documentation

## 2024-01-28 DIAGNOSIS — Z1509 Genetic susceptibility to other malignant neoplasm: Secondary | ICD-10-CM

## 2024-01-28 DIAGNOSIS — D649 Anemia, unspecified: Secondary | ICD-10-CM

## 2024-01-28 HISTORY — PX: CYSTOSCOPY: SHX5120

## 2024-01-28 HISTORY — PX: ROBOTIC ASSISTED LAPAROSCOPIC LYSIS OF ADHESION: SHX6080

## 2024-01-28 HISTORY — PX: ROBOTIC ASSISTED TOTAL HYSTERECTOMY WITH BILATERAL SALPINGO OOPHERECTOMY: SHX6086

## 2024-01-28 LAB — POCT PREGNANCY, URINE: Preg Test, Ur: NEGATIVE

## 2024-01-28 SURGERY — HYSTERECTOMY, TOTAL, ROBOT-ASSISTED, LAPAROSCOPIC, WITH BILATERAL SALPINGO-OOPHORECTOMY
Anesthesia: General | Site: Uterus

## 2024-01-28 MED ORDER — LIDOCAINE HCL (PF) 2 % IJ SOLN
INTRAMUSCULAR | Status: AC
  Filled 2024-01-28: qty 5

## 2024-01-28 MED ORDER — 0.9 % SODIUM CHLORIDE (POUR BTL) OPTIME
TOPICAL | Status: DC | PRN
  Administered 2024-01-28: 08:00:00 500 mL

## 2024-01-28 MED ORDER — KETAMINE HCL 10 MG/ML IJ SOLN
INTRAMUSCULAR | Status: DC | PRN
  Administered 2024-01-28: 09:00:00 10 mg via INTRAVENOUS
  Administered 2024-01-28: 08:00:00 20 mg via INTRAVENOUS

## 2024-01-28 MED ORDER — ACETAMINOPHEN EXTRA STRENGTH 500 MG PO TABS
1000.0000 mg | ORAL_TABLET | Freq: Four times a day (QID) | ORAL | 0 refills | Status: AC
  Filled 2024-01-28: qty 24, 3d supply, fill #0

## 2024-01-28 MED ORDER — ROCURONIUM BROMIDE 100 MG/10ML IV SOLN
INTRAVENOUS | Status: DC | PRN
  Administered 2024-01-28: 10:00:00 10 mg via INTRAVENOUS
  Administered 2024-01-28: 08:00:00 50 mg via INTRAVENOUS
  Administered 2024-01-28 (×2): 30 mg via INTRAVENOUS
  Administered 2024-01-28: 10:00:00 10 mg via INTRAVENOUS

## 2024-01-28 MED ORDER — SUCCINYLCHOLINE CHLORIDE 200 MG/10ML IV SOSY
PREFILLED_SYRINGE | INTRAVENOUS | Status: AC
  Filled 2024-01-28: qty 10

## 2024-01-28 MED ORDER — MIDAZOLAM HCL 2 MG/2ML IJ SOLN
INTRAMUSCULAR | Status: AC
  Filled 2024-01-28: qty 2

## 2024-01-28 MED ORDER — SEVOFLURANE IN SOLN
RESPIRATORY_TRACT | Status: AC
  Filled 2024-01-28: qty 250

## 2024-01-28 MED ORDER — SUGAMMADEX SODIUM 200 MG/2ML IV SOLN
INTRAVENOUS | Status: DC | PRN
  Administered 2024-01-28: 10:00:00 200 mg via INTRAVENOUS
  Administered 2024-01-28: 10:00:00 100 mg via INTRAVENOUS

## 2024-01-28 MED ORDER — DEXMEDETOMIDINE HCL IN NACL 200 MCG/50ML IV SOLN
INTRAVENOUS | Status: DC | PRN
  Administered 2024-01-28: 10:00:00 4 ug via INTRAVENOUS

## 2024-01-28 MED ORDER — ONDANSETRON HCL 4 MG/2ML IJ SOLN
INTRAMUSCULAR | Status: AC
  Filled 2024-01-28: qty 2

## 2024-01-28 MED ORDER — EPHEDRINE 5 MG/ML INJ
INTRAVENOUS | Status: AC
  Filled 2024-01-28: qty 5

## 2024-01-28 MED ORDER — OXYCODONE HCL 5 MG PO TABS
5.0000 mg | ORAL_TABLET | ORAL | 0 refills | Status: AC | PRN
  Filled 2024-01-28: qty 15, 3d supply, fill #0

## 2024-01-28 MED ORDER — ROCURONIUM BROMIDE 10 MG/ML (PF) SYRINGE
PREFILLED_SYRINGE | INTRAVENOUS | Status: AC
  Filled 2024-01-28: qty 10

## 2024-01-28 MED ORDER — ENOXAPARIN SODIUM 40 MG/0.4ML IJ SOSY
PREFILLED_SYRINGE | INTRAMUSCULAR | Status: AC
  Filled 2024-01-28: qty 0.4

## 2024-01-28 MED ORDER — SODIUM CHLORIDE 0.9 % IR SOLN
Status: DC | PRN
  Administered 2024-01-28: 10:00:00 500 mL via INTRAVESICAL

## 2024-01-28 MED ORDER — LIDOCAINE HCL (CARDIAC) PF 100 MG/5ML IV SOSY
PREFILLED_SYRINGE | INTRAVENOUS | Status: DC | PRN
  Administered 2024-01-28: 08:00:00 60 mg via INTRAVENOUS

## 2024-01-28 MED ORDER — OXYCODONE HCL 5 MG PO TABS
5.0000 mg | ORAL_TABLET | Freq: Once | ORAL | Status: AC | PRN
  Administered 2024-01-28: 12:00:00 5 mg via ORAL

## 2024-01-28 MED ORDER — PROPOFOL 1000 MG/100ML IV EMUL
INTRAVENOUS | Status: AC
  Filled 2024-01-28: qty 100

## 2024-01-28 MED ORDER — KETAMINE HCL 50 MG/5ML IJ SOSY
PREFILLED_SYRINGE | INTRAMUSCULAR | Status: AC
  Filled 2024-01-28: qty 5

## 2024-01-28 MED ORDER — DEXAMETHASONE SOD PHOSPHATE PF 10 MG/ML IJ SOLN
INTRAMUSCULAR | Status: DC | PRN
  Administered 2024-01-28: 08:00:00 10 mg via INTRAVENOUS

## 2024-01-28 MED ORDER — CHLORHEXIDINE GLUCONATE 0.12 % MT SOLN
OROMUCOSAL | Status: AC
  Filled 2024-01-28: qty 15

## 2024-01-28 MED ORDER — CEFAZOLIN SODIUM 1 G IJ SOLR
INTRAMUSCULAR | Status: AC
  Filled 2024-01-28: qty 10

## 2024-01-28 MED ORDER — PHENYLEPHRINE HCL-NACL 20-0.9 MG/250ML-% IV SOLN
INTRAVENOUS | Status: AC
  Filled 2024-01-28: qty 250

## 2024-01-28 MED ORDER — BUPIVACAINE HCL (PF) 0.5 % IJ SOLN
INTRAMUSCULAR | Status: AC
  Filled 2024-01-28: qty 30

## 2024-01-28 MED ORDER — METRONIDAZOLE 500 MG/100ML IV SOLN
500.0000 mg | Freq: Once | INTRAVENOUS | Status: AC
  Administered 2024-01-28: 08:00:00 500 mg via INTRAVENOUS
  Filled 2024-01-28: qty 100

## 2024-01-28 MED ORDER — OXYCODONE HCL 5 MG/5ML PO SOLN: 5.0000 mg | Freq: Once | ORAL | Status: AC | PRN

## 2024-01-28 MED ORDER — CELECOXIB 200 MG PO CAPS
200.0000 mg | ORAL_CAPSULE | Freq: Two times a day (BID) | ORAL | 0 refills | Status: AC
  Filled 2024-01-28: qty 6, 3d supply, fill #0

## 2024-01-28 MED ORDER — MIDAZOLAM HCL (PF) 2 MG/2ML IJ SOLN
INTRAMUSCULAR | Status: DC | PRN
  Administered 2024-01-28: 08:00:00 2 mg via INTRAVENOUS

## 2024-01-28 MED ORDER — OXYCODONE HCL 5 MG PO TABS
ORAL_TABLET | ORAL | Status: AC
  Filled 2024-01-28: qty 1

## 2024-01-28 MED ORDER — FENTANYL CITRATE (PF) 100 MCG/2ML IJ SOLN
INTRAMUSCULAR | Status: DC | PRN
  Administered 2024-01-28 (×2): 50 ug via INTRAVENOUS

## 2024-01-28 MED ORDER — PROPOFOL 10 MG/ML IV BOLUS
INTRAVENOUS | Status: DC | PRN
  Administered 2024-01-28: 10:00:00 20 mg via INTRAVENOUS
  Administered 2024-01-28: 08:00:00 120 mg via INTRAVENOUS
  Administered 2024-01-28: 10:00:00 20 mg via INTRAVENOUS
  Administered 2024-01-28: 10:00:00 30 mg via INTRAVENOUS
  Administered 2024-01-28: 10:00:00 20 mg via INTRAVENOUS

## 2024-01-28 MED ORDER — PROPOFOL 10 MG/ML IV BOLUS
INTRAVENOUS | Status: AC
  Filled 2024-01-28: qty 60

## 2024-01-28 MED ORDER — ACETAMINOPHEN 10 MG/ML IV SOLN
INTRAVENOUS | Status: DC | PRN
  Administered 2024-01-28: 09:00:00 1000 mg via INTRAVENOUS

## 2024-01-28 MED ORDER — HYDROMORPHONE HCL 1 MG/ML IJ SOLN: 0.2500 mg | INTRAMUSCULAR | Status: DC | PRN

## 2024-01-28 MED ORDER — ONDANSETRON HCL 4 MG/2ML IJ SOLN
INTRAMUSCULAR | Status: DC | PRN
  Administered 2024-01-28: 10:00:00 4 mg via INTRAVENOUS

## 2024-01-28 MED ORDER — PROPOFOL 500 MG/50ML IV EMUL
INTRAVENOUS | Status: DC | PRN
  Administered 2024-01-28: 08:00:00 150 ug/kg/min via INTRAVENOUS

## 2024-01-28 MED ORDER — BUPIVACAINE HCL (PF) 0.5 % IJ SOLN
INTRAMUSCULAR | Status: DC | PRN
  Administered 2024-01-28: 09:00:00 15 mL

## 2024-01-28 MED FILL — Fentanyl Citrate Preservative Free (PF) Inj 100 MCG/2ML: INTRAMUSCULAR | Qty: 2 | Status: AC

## 2024-01-28 MED FILL — Ondansetron Orally Disintegrating Tab 4 MG: 4.0000 mg | ORAL | 3 days supply | Qty: 7 | Fill #0 | Status: AC

## 2024-01-28 MED FILL — Propofol IV Emul 200 MG/20ML (10 MG/ML): INTRAVENOUS | Qty: 20 | Status: AC

## 2024-01-28 SURGICAL SUPPLY — 51 items
BAG URINE DRAIN 2000ML AR STRL (UROLOGICAL SUPPLIES) ×3 IMPLANT
BLADE SURG SZ11 CARB STEEL (BLADE) ×3 IMPLANT
CANNULA CAP OBTURATR AIRSEAL 8 (CAP) ×3 IMPLANT
CATH ROBINSON RED A/P 16FR (CATHETERS) ×3 IMPLANT
CATH URTH 16FR FL 2W BLN LF (CATHETERS) ×3 IMPLANT
COVER TIP SHEARS 8 DVNC (MISCELLANEOUS) ×3 IMPLANT
DEFOGGER SCOPE WARM SEASHARP (MISCELLANEOUS) ×3 IMPLANT
DERMABOND ADVANCED .7 DNX12 (GAUZE/BANDAGES/DRESSINGS) ×3 IMPLANT
DRAPE ARM DVNC X/XI (DISPOSABLE) ×9 IMPLANT
DRAPE COLUMN DVNC XI (DISPOSABLE) ×3 IMPLANT
DRIVER NDL MEGA 8 DVNC XI (INSTRUMENTS) ×3 IMPLANT
DRIVER NDLE MEGA DVNC XI (INSTRUMENTS) ×3 IMPLANT
ELECTRODE REM PT RTRN 9FT ADLT (ELECTROSURGICAL) ×3 IMPLANT
FORCEPS BPLR FENES DVNC XI (FORCEP) ×3 IMPLANT
GAUZE 4X4 16PLY ~~LOC~~+RFID DBL (SPONGE) ×3 IMPLANT
GLOVE BIO SURGEON STRL SZ7 (GLOVE) ×12 IMPLANT
GLOVE BIOGEL PI IND STRL 7.5 (GLOVE) ×12 IMPLANT
GLOVE INDICATOR 7.5 STRL GRN (GLOVE) ×3 IMPLANT
GOWN STRL REUS W/ TWL LRG LVL3 (GOWN DISPOSABLE) ×9 IMPLANT
GRASPER SUT TROCAR 14GX15 (MISCELLANEOUS) IMPLANT
IRRIGATION STRYKERFLOW (MISCELLANEOUS) IMPLANT
IV 0.9% NACL 1000 ML (IV SOLUTION) IMPLANT
KIT PINK PAD W/HEAD ARM REST (MISCELLANEOUS) ×3 IMPLANT
LABEL OR SOLS (LABEL) ×3 IMPLANT
MANIFOLD NEPTUNE II (INSTRUMENTS) ×3 IMPLANT
MANIPULATOR VCARE LG CRV RETR (MISCELLANEOUS) IMPLANT
MANIPULATOR VCARE STD CRV RETR (MISCELLANEOUS) IMPLANT
NDL 21 GA WING INFUSION (NEEDLE) IMPLANT
NEEDLE 21 GA WING INFUSION (NEEDLE) IMPLANT
NS IRRIG 500ML POUR BTL (IV SOLUTION) ×3 IMPLANT
OBTURATOR OPTICALSTD 8 DVNC (TROCAR) ×3 IMPLANT
OCCLUDER COLPOPNEUMO (BALLOONS) ×3 IMPLANT
PACK GYN LAPAROSCOPIC (MISCELLANEOUS) ×3 IMPLANT
PAD OB MATERNITY 11 LF (PERSONAL CARE ITEMS) IMPLANT
PAD PREP OB/GYN DISP 24X41 (PERSONAL CARE ITEMS) ×3 IMPLANT
SCISSORS MNPLR CVD DVNC XI (INSTRUMENTS) ×3 IMPLANT
SCRUB CHG 4% DYNA-HEX 4OZ (MISCELLANEOUS) ×3 IMPLANT
SEAL UNIV 5-12 XI (MISCELLANEOUS) ×6 IMPLANT
SEALER VESSEL EXT DVNC XI (MISCELLANEOUS) IMPLANT
SET CYSTO IRRIGATION (SET/KITS/TRAYS/PACK) ×3 IMPLANT
SET TUBE FILTERED XL AIRSEAL (SET/KITS/TRAYS/PACK) ×3 IMPLANT
SOLUTION ELECTROSURG ANTI STCK (MISCELLANEOUS) ×3 IMPLANT
SOLUTION PREP PVP 2OZ (MISCELLANEOUS) ×3 IMPLANT
SURGILUBE 2OZ TUBE FLIPTOP (MISCELLANEOUS) ×3 IMPLANT
SUT STRATA PDS 0 30 CT-2.5 (SUTURE) ×3 IMPLANT
SUT VICRYL 0 UR6 27IN ABS (SUTURE) IMPLANT
SUTURE MNCRL 4-0 27XMF (SUTURE) ×3 IMPLANT
SUTURE STRAT PDS 2-0 23 CT-2.5 (SUTURE) ×3 IMPLANT
SYR 50ML LL SCALE MARK (SYRINGE) ×3 IMPLANT
TRAP FLUID SMOKE EVACUATOR (MISCELLANEOUS) ×3 IMPLANT
TUBING ART PRESS 48 MALE/FEM (TUBING) IMPLANT

## 2024-01-28 NOTE — Interval H&P Note (Signed)
 History and Physical Interval Note:  01/28/2024 7:27 AM  Penny Gomez  has presented today for surgery, with the diagnosis of menorrhagia, BRCA 1.  The various methods of treatment have been discussed with the patient and family. After consideration of risks, benefits and other options for treatment, the patient has consented to  Procedures: HYSTERECTOMY, TOTAL, ROBOT-ASSISTED, LAPAROSCOPIC, WITH BILATERAL SALPINGO-OOPHORECTOMY (Bilateral) CYSTOSCOPY (N/A) as a surgical intervention.  The patient's history has been reviewed, patient examined, no change in status, stable for surgery.  I have reviewed the patient's chart and labs.  Questions were answered to the patient's satisfaction.     Heather Penton

## 2024-01-28 NOTE — Anesthesia Procedure Notes (Signed)
 Procedure Name: Intubation Date/Time: 01/28/2024 7:42 AM  Performed by: Brien Sotero PARAS, CRNAPre-anesthesia Checklist: Patient identified, Emergency Drugs available, Suction available and Patient being monitored Patient Re-evaluated:Patient Re-evaluated prior to induction Oxygen Delivery Method: Circle system utilized Preoxygenation: Pre-oxygenation with 100% oxygen Induction Type: IV induction Ventilation: Mask ventilation without difficulty Laryngoscope Size: McGrath and 3 Grade View: Grade I Tube type: Oral Tube size: 7.0 mm Number of attempts: 1 Airway Equipment and Method: Stylet and Oral airway Placement Confirmation: ETT inserted through vocal cords under direct vision, positive ETCO2 and breath sounds checked- equal and bilateral Secured at: 20 cm Tube secured with: Tape Dental Injury: Teeth and Oropharynx as per pre-operative assessment

## 2024-01-28 NOTE — Op Note (Signed)
 Penny Gomez PROCEDURE DATE: 01/28/2024  PREOPERATIVE DIAGNOSIS: BRCA1, menorrhagia POSTOPERATIVE DIAGNOSIS: Fibroid uterus, endometriosis, pelvic adhesive disease.  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 Heather Penton, MD ASSISTANT: CST Anesthesiologist:  Anesthesiologist: Chesley Lendia CROME, MD CRNA: Dominica Krabbe, CRNA; Brien Sotero PARAS, CRNA Student Nurse Anesthetist: Colon Morna SQUIBB, RN   INDICATIONS: 38 y.o. F  here for definitive surgical management secondary to the indications listed under preoperative diagnoses; please see preoperative note for further details.  Risks of surgery were discussed with the patient including but not limited to: bleeding which may require transfusion or reoperation; infection which may require antibiotics; injury to bowel, bladder, ureters or other surrounding organs; need for additional procedures; thromboembolic phenomenon, incisional problems and other postoperative/anesthesia complications. Written informed consent was obtained.    FINDINGS:    External genitalia, vaginal canal and cervix negative for lesions. Intraoperative findings revealed a normal upper abdomen including bowel, liver, diaphragmatic surfaces, stomach, and omentum.   Enlarged fibroid uterus, with large left hemorrhagic ovarian cyst in the area of the broad ligament that obscured the left uterine artery.  Endometriosis retroperitoneal scarring was noted in this area as well.  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  The left ureter needed to be dissected out from the pelvic sidewall and the left hypogastric artery due to adhesions and obscuring fibroids with a large hemorrhagic cyst. Her left ovary was adherent in the pelvic sidewall and to the left side of the uterus.  Mild adhesive disease at the anterior cervix from prior C-sections.  Right lower quadrant with adhesions obscuring the appendix, which was not visualized.  Bladder without abnormalities, and excellent vigorous bilateral efflux from the ureteral openings.  ANESTHESIA:    General INTRAVENOUS FLUIDS:800  ml ESTIMATED BLOOD LOSS:10 ml URINE OUTPUT: 500 ml  SPECIMENS: Uterus, cervix, bilateral fallopian tubes and bilateral ovaries.  Pathologic specimen sent off with the SEE-FIM protocol, and the specimens were collected carefully COMPLICATIONS: None immediate  PROCEDURE IN DETAIL: After informed consent was obtained, the patient was taken to the operating room where general anesthesia was obtained without difficulty. The patient was positioned in the dorsal lithotomy position in [place] stirrups and her arms were carefully tucked at her sides and the usual precautions were taken. Deep Trendelenburg (20-25 deg) was established to confirm that she does not shift on the table.  She was prepped and draped in normal sterile fashion.  Time-out was performed and a Foley catheter was placed into the bladder. A standard VCare uterine manipulator was then placed in the uterus without incident.  Preoperative prophylactic antibiotics were given through her iv.  After infiltration of local anesthetic at the proposed trocar sites, an 8 mm incision was created at the superior umbilicus, and an AirSeal 5mm was placed under direct visualization, after confirmation of OG tube working well. Pneumoperitoneum was created to a pressure of 15 mm Hg. The camera was placed and the abdomin surveyed, noting intact bowel below  the site of entry. A survey of the pelvis and upper abdomen revealed the above findings. One right and one left lateral 8-mm robotic ports were placed under direct visualization.  The patient was placed in deepTrendelenburg and the bowel was displaced up into the upper abdomen. The robot was left side docked. The instruments were placed under direct visualization.   The ureters were identified bilaterally coursing outside of the operative field at the pelvic brim, and the left ureter was dissected out, following its course down to 2 where entered under the uterine artery.  Round ligaments were divided on each side with the EndoShears and the retroperitoneal space was opened bilaterally. The posterior leaflet of the broad was taken down to the level of the IP ligament. The anterior leaflet of the broad ligament was carefully taken down to the midline.  A bladder flap was created and the bladder was dissected down off the lower uterine segment and cervix using endoshears and electrocautery.    Ovariolysis was performed and the ovary was dissected medially with care to avoid the ureter.  The infundibulopelvic ligaments were skeletonized, sealed and divided. The IP was dissected out at least 3 cm from its insertion into the ovary, and carefully cauterized to avoid underlying tissues.  The peritoneum was taken down to the level of the internal os, and the uterine arteries skeletonized. With strong cephalad pressure from the V-care, bipolar cautery was used to seal and transect the uterine arteries, and the pedicles allowed to fall away laterally.    A colpotomy was performed circumferentially along the V-Care ring with monopolar electrocautery and the cervix was incised from the vagina using the laparoscopic scissors. The specimen was removed through the vagina, with vaginal morcellation required.    A pneumo balloon was placed in the vagina and the vaginal cuff was then closed in a running continuous fashion  using the  0 barbed PDS suture with careful attention to include the vaginal cuff angles, the uterosacral ligaments and the vaginal mucosa within the closure.  Secondary layer was used to maintain uterosacral integrity.    A modified McCall's culdoplasty was performed, as once released, the fibroids had pushed the apical area of the cervix into vaginal prolapse.  Because of the extension of the uterosacral ligaments, a culdoplasty was performed.  Hemostasis was secured with intraabdominal pressure and review of all surgical sites. The intraperitoneal pressure was dropped, and all planes of dissection, vascular pedicles and the vaginal cuff were found to be hemostatic.  The robot was undocked.   Attention was turned to the bladder and cystoscopy showed vigorous bilateral ureteral jets.  No stitches were visualized in the bladder during cystoscopy.  Figure-of-eight 0 Vicryl stitch was placed across the vaginal apex at the cervical cuff to ensure hemostasis on vaginal exam at the completion of the case.  The lateral trocars were removed under visualization.  The CO2 gas was released and several deep breaths given to remove any remaining CO2 from the peritoneal cavity.  The skin incisions were closed with 4-0 Monocryl subcuticular stitch and Dermabond.     Anesthesia was reversed without difficulty.  The patient tolerated the procedure well.  Sponge, lap and needle counts were correct x2.  The patient was taken to recovery room in excellent condition.

## 2024-01-28 NOTE — Transfer of Care (Signed)
 Immediate Anesthesia Transfer of Care Note  Patient: Penny Gomez  Procedure(s) Performed: HYSTERECTOMY, TOTAL, ROBOT-ASSISTED, LAPAROSCOPIC, WITH BILATERAL SALPINGO-OOPHORECTOMY (Bilateral: Uterus) EXCISION, ENDOMETRIOSIS, ROBOT-ASSISTED, LAPAROSCOPIC (Pelvis) LYSIS, ADHESIONS, ROBOT-ASSISTED, LAPAROSCOPIC (Pelvis) CYSTOSCOPY (Bladder)  Patient Location: PACU  Anesthesia Type:General  Level of Consciousness: drowsy  Airway & Oxygen Therapy: Patient Spontanous Breathing and Patient connected to face mask oxygen  Post-op Assessment: Report given to RN and Post -op Vital signs reviewed and stable  Post vital signs: Reviewed and stable  Last Vitals:  Vitals Value Taken Time  BP 147/134 01/28/24 10:35  Temp    Pulse 88 01/28/24 10:38  Resp 26 01/28/24 10:39  SpO2 100 % 01/28/24 10:38  Vitals shown include unfiled device data.  Last Pain:  Vitals:   01/28/24 0625  TempSrc: Temporal  PainSc: 0-No pain         Complications: No notable events documented.

## 2024-01-28 NOTE — Anesthesia Postprocedure Evaluation (Signed)
 Anesthesia Post Note  Patient: Penny Gomez  Procedure(s) Performed: HYSTERECTOMY, TOTAL, ROBOT-ASSISTED, LAPAROSCOPIC, WITH BILATERAL SALPINGO-OOPHORECTOMY (Bilateral: Uterus) EXCISION, ENDOMETRIOSIS, ROBOT-ASSISTED, LAPAROSCOPIC (Pelvis) LYSIS, ADHESIONS, ROBOT-ASSISTED, LAPAROSCOPIC (Pelvis) CYSTOSCOPY (Bladder)  Patient location during evaluation: PACU Anesthesia Type: General Level of consciousness: awake and alert Pain management: pain level controlled Vital Signs Assessment: post-procedure vital signs reviewed and stable Respiratory status: spontaneous breathing, nonlabored ventilation, respiratory function stable and patient connected to nasal cannula oxygen Cardiovascular status: blood pressure returned to baseline and stable Postop Assessment: no apparent nausea or vomiting Anesthetic complications: no   No notable events documented.   Last Vitals:  Vitals:   01/28/24 1137 01/28/24 1154  BP: 131/78 132/82  Pulse: 78 69  Resp: 20 18  Temp: 36.7 C 36.7 C  SpO2: 100% 96%    Last Pain:  Vitals:   01/28/24 1156  TempSrc:   PainSc: 5                  Lendia LITTIE Mae

## 2024-01-28 NOTE — Discharge Instructions (Signed)
 Discharge instructions after  robotically-assisted total laparoscopic hysterectomy   For the next three days, take celebrex  and acetaminophen  on a schedule.  Celebrex  is twice a day and the acetaminophen  will be 3 times a day.  You can take them together. You also have a narcotic, oxycodone , to take as needed if the above medicines don't help.  Postop constipation is a major cause of pain. Stay well hydrated, walk as you tolerate, and take over the counter senna as well as stool softeners if you need them.  Signs and Symptoms to Report Call our office at [phone] if you have any of the following.   Fever over 100.4 degrees or higher  Severe stomach pain not relieved with pain medications  Bright red bleeding thats heavier than a period that does not slow with rest  To go the bathroom a lot (frequency), you cant hold your urine (urgency), or it hurts when you empty your bladder (urinate)  Chest pain  Shortness of breath  Pain in the calves of your legs  Severe nausea and vomiting not relieved with anti-nausea medications  Signs of infection around your wounds, such as redness, hot to touch, swelling, green/yellow drainage (like pus), bad smelling discharge  Any concerns  What You Can Expect after Surgery  You may see some pink tinged, bloody fluid and bruising around the wound. This is normal.  You may notice shoulder and neck pain. This is caused by the gas used during surgery to expand your abdomen so your surgeon could get to the uterus easier.  You may have a sore throat because of the tube in your mouth during general anesthesia. This will go away in 2 to 3 days.  You may have some stomach cramps.  You may notice spotting on your panties.  You may have pain around the incision sites.   Activities after Your Discharge Follow these guidelines to help speed your recovery at home:  Do the coughing and deep breathing as you did in the hospital for 2 weeks. Use the small blue  breathing device, called the incentive spirometer for 2 weeks.  Dont drive if you are in pain or taking narcotic pain medicine. You may drive when you can safely slam on the brakes, turn the wheel forcefully, and rotate your torso comfortably. This is typically 1-2 weeks. Practice in a parking lot or side street prior to attempting to drive regularly.   Ask others to help with household chores for 4 weeks.  Do not lift anything heavier that 10 pounds for 4-6 weeks. This includes pets, children, and groceries.  Dont do strenuous activities, exercises, or sports like vacuuming, tennis, squash, etc. until your doctor says it is safe to do so. ---Maintain pelvic rest for 12 weeks. This means nothing in the vagina or rectum at all (no douching, tampons, intercourse) for 12 weeks.   Walk as you feel able. Rest often since it may take two or three weeks for your energy level to return to normal.   You may climb stairs  Avoid constipation:   -Eat fruits, vegetables, and whole grains. Eat small meals as your appetite will take time to return to normal.   -Drink 6 to 8 glasses of water  each day unless your doctor has told you to limit your fluids.   -Use a laxative or stool softener as needed if constipation becomes a problem. You may take Miralax , metamucil, Citrucil, Colace, Senekot, FiberCon, etc. If this does not relieve the constipation, try  two tablespoons of Milk Of Magnesia every 8 hours until your bowels move.   You may shower. Gently wash the wounds with a mild soap and water . Pat dry.  Do not get in a hot tub, swimming pool, etc. for 6 weeks.  Do not use lotions, oils, powders on the wounds.  Do not douche, use tampons, or have sex until your doctor says it is okay.  Take your pain medicine when you need it. The medicine may not work as well if the pain is bad.  Take the medicines you were taking before surgery. Other medications you will need are pain medications (Norco or Percocet) and nausea  medications (Zofran ).  Here is a helpful article from the website Http://mitchell.org/, regarding constipation  Here are reasons why constipation occurs after surgery: 1) During the operation and in the recovery room, most people are given opioid pain medication, primarily through an IV, to treat moderate or severe pain. Intravenous opioids include morphine , Dilaudid  and fentanyl . After surgery, patients are often prescribed opioid pain medication to take by mouth at home, including codeine, Vicodin, Norco, and Percocet. All of these medications cause constipation by slowing down the movement of your intestine. 2) Changes in your diet before surgery can be another culprit. It is common to get specific instructions to change how you normally eat or drink before your surgery, like only having liquids the day before or not having anything to eat or drink after midnight the night before surgery. For this reason, temporary dehydration may occur. This, along with not eating or only having liquids, means that you are getting less fiber than usual. Both these factors contribute to constipation. 3) Changes in your diet after surgery can also contribute to the problem. Although many people dont have dietary restrictions after operations, being under anesthesia can make you lose your appetite for several hours and maybe even days. Some people can even have nausea or vomiting. Not eating or drinking normally means that you are not getting enough fiber and you can get dehydrated, both leading to constipation. 4) Lying in a bed more than usual--which happens before, during and after surgery--combined with the medications and diet changes, all work together to slow down your colon and make your poop turn to rock.  No one likes to be constipated.  Lets face it, its not a pleasant feeling when you dont poop for days, then strain on the toilet to finally pass something large enough to cause damage. An ounce of prevention is  worth a pound of cure, so: Assume you will be constipated. Plan and prepare accordingly. Post-surgery is one of those unique situations where the temporary use of laxatives can make a world of difference. Always consult with your doctor, and recognize that if you wait several days after surgery to take a laxative, the constipation might be too severe for these over-the-counter options. It is always important to discuss all medications you plan on taking with your doctor. Ask your doctor if you can start the laxative immediately after surgery. *  Here are go-to post-surgery laxatives: Senna: Senna is an herb that acts as a stimulant laxative, meaning it increases the activity of the intestine to cause you to have a bowel movement. It comes in many forms, but senna pills are easy to take and are sold over the counter at almost all pharmacies. Since opioid pain medications slow down the activity of the intestine, it makes sense to take a medication to help reverse that side  effect. Long-term use of a stimulant laxative is not a good idea since it can make your colon lazy and not function properly; however, temporary use immediately after surgery is acceptable. In general, if you are able to eat a normal diet, taking senna soon after surgery works the best. Senna usually works within hours to produce a bowel movement, but this is less predictable when you are taking different medications after surgery. Try not to wait several days to start taking senna, as often it is too late by then. Just like with all medications or supplements, check with your doctor before starting new treatment.   Magnesium: Magnesium is an important mineral that our body needs. We get magnesium from some foods that we eat, especially foods that are high in fiber such as broccoli, almonds and whole grains. There are also magnesium-based medications used to treat constipation including milk of magnesia (magnesium hydroxide), magnesium  citrate and magnesium oxide. They work by drawing water  into the intestine, putting it into the class of osmotic laxatives. Magnesium products in low doses appear to be safe, but if taken in very large doses, can lead to problems such as irregular heartbeat, low blood pressure and even death. It can also affect other medications you might be taking, therefore it is important to discuss using magnesium with your physician and pharmacist before initiating therapy. Most over-the-counter magnesium laxatives work very well to help with the constipation related to surgery, but sometimes they work too well and lead to diarrhea. Make sure you are somewhere with easy access to a bathroom, just in case.   Bisacodyl: Bisacodyl (generic name) is sold under brand names such as Dulcolax. Much like senna, it is a stimulant laxative, meaning it makes your intestines move more quickly to push out the stool. This is another good choice to start taking as soon as your doctor says you can take a laxative after surgery. It comes in pill form and as a suppository, which is a good choice for people who cannot or are not allowed to swallow pills. Studies have shown that it works as a laxative, but like most of these medications, you should use this on a short-term basis only.   Enema: Enemas strike fear in many people, but FEAR NOT! Its nowhere near as big a deal as you may think. An enema is just a way to get some liquid into your rectum by placing a specially designed device through your anus. If you have never done one, it might seem like a painful, unpleasant, uncomfortable, complicated and lengthy procedure. But in reality, its simple, takes just a few seconds and is highly effective. The small ready-made bottles you buy at the pharmacy are much easier than the hose/large rubber container type. Those recommended positions illustrated in some instructions are generally not necessary to place the enema. Its very similar to  the insertion of a tampon, requiring a slight squat. Some extra lubrication on the enemas tip (or on your anus) will make it a breeze. In certain cases, there is no substitute for a good enema. For example, if someone has not pooped for a few days, the beginning of the poop waiting to come out can become rock hard. Passing that hard stool can lead to much pain and problems like anal fissures. Inserting a little liquid to break up the rock-hard stool will help make its passage much easier. Enemas come with different liquids. Most come with saline, but there are also mineral oil options.  You can also use warm water  in the reusable enema containers. They all work. But since saline can sometimes be irritating, so try a mineral oil or water  enema instead.  Here are commonly recommended constipation medications that do not work well for post-surgery constipation: Docusate: Docusate (generic name) most commonly referred to as Colace (brand name) is not really a laxative, but is classified as a stool softener. Although this medication is commonly prescribed, it is not recommended for several reasons: 1) there is no good medical evidence that it works 2) even if it has an effect, which is very questionable, it is minimal and cannot combat the intestinal slowing caused by the opioid medications. Skip docusate to save money and space in your pillbox for something more effective.  PEG: Miralax  (brand name) is basically a chemical called polyethylene glycol (PEG) and it has gained tremendous popularity as a laxative. This product is an osmotic laxative meaning it works by pulling water  into the stool, making it softer. This is very similar to the action of natural fiber in foods and supplements. Therefore, the effect seen by this medication is not immediate, causing a bowel movement in a day or more. Is this medication strong enough to battle the constipation related to having an operation? Maybe for some people not  prone to constipation. But for most people, other laxatives are better to prevent constipation after surgery.

## 2024-01-28 NOTE — Anesthesia Preprocedure Evaluation (Signed)
 Anesthesia Evaluation  Patient identified by MRN, date of birth, ID band Patient awake    Reviewed: Allergy & Precautions, NPO status , Patient's Chart, lab work & pertinent test results  History of Anesthesia Complications Negative for: history of anesthetic complications  Airway Mallampati: III  TM Distance: >3 FB Neck ROM: full    Dental no notable dental hx.    Pulmonary neg pulmonary ROS   Pulmonary exam normal        Cardiovascular negative cardio ROS Normal cardiovascular exam     Neuro/Psych negative neurological ROS  negative psych ROS   GI/Hepatic negative GI ROS, Neg liver ROS,,,  Endo/Other  negative endocrine ROS    Renal/GU negative Renal ROS  negative genitourinary   Musculoskeletal   Abdominal   Peds  Hematology  (+) Blood dyscrasia, anemia   Anesthesia Other Findings Past Medical History: No date: Acute pulmonary embolism (HCC) No date: BRCA1 gene mutation positive No date: Breast lump No date: Chronic anticoagulation No date: Endocarditis No date: Fibroid, uterine No date: Immunosuppressed due to chemotherapy No date: Microcytic anemia No date: MSSA bacteremia No date: Multifocal pneumonia No date: Sepsis (HCC)     Comment:  due to port a cath No date: STD (sexually transmitted disease)     Comment:  From Medical Hx; No date: Thrombocytopenia No date: Triple negative breast cancer (HCC)     Comment:  Left No date: Vascular port complication  Past Surgical History: 05/20/2019: BREAST BIOPSY; Right     Comment:  Affirm Bx- X- clip, neg 10/02/2022: BREAST BIOPSY; Left     Comment:  US  Bx, path pending 10/02/2022: BREAST BIOPSY; Left     Comment:  Us  Bx Node- path pending 10/02/2022: BREAST BIOPSY; Left     Comment:  US  LT BREAST BX W LOC DEV 1ST LESION IMG BX SPEC US                GUIDE 10/02/2022 [facility] 07/24/2023: BREAST RECONSTRUCTION; Bilateral     Comment:   Procedure: RECONSTRUCTION, BREAST;  Surgeon: Lowery Estefana RAMAN, DO;  Location: [facility];  Service: Plastics;                Laterality: Bilateral;  BILATERAL IMMEDIATE BREAST               RECONSTRUCTION WITH EXPANDER AND FLEX HD PLACEMENT No date: CESAREAN SECTION     Comment:  x 2 01/27/2024: IR CV LINE INJECTION 02/09/2022: LAPAROSCOPIC BILATERAL SALPINGECTOMY; Bilateral     Comment:  Procedure: LAPAROSCOPIC BILATERAL SALPINGECTOMY;                Surgeon: Connell Davies, MD;  Location: [facility];                Service: Gynecology;  Laterality: Bilateral; 07/24/2023: MASTECTOMY W/ SENTINEL NODE BIOPSY; Bilateral     Comment:  Procedure: MASTECTOMY WITH SENTINEL LYMPH NODE BIOPSY;                Surgeon: Tye Millet, DO;  Location: [facility];  Service:              General;  Laterality: Bilateral;  SN biopsy left only 10/17/2022: PORTACATH PLACEMENT; Right     Comment:  Procedure: INSERTION PORT-A-CATH;  Surgeon: Desiderio Schanz, MD;  Location: [facility];  Service: General;  Laterality: Right; 02/10/2023: PORTACATH PLACEMENT; Left     Comment:  Procedure: INSERTION PORT-A-CATH;  Surgeon: Tye Millet, DO;  Location: [facility];  Service: General;                Laterality: Left; 09/11/2023: REMOVAL OF BILATERAL TISSUE EXPANDERS WITH PLACEMENT OF  BILATERAL BREAST IMPLANTS; Bilateral     Comment:  Procedure: REMOVAL, TISSUE EXPANDER, BREAST, BILATERAL,               WITH BILATERAL IMPLANT IMPLANT INSERTION;  Surgeon:               Lowery Estefana RAMAN, DO;  Location: [facility];  Service: Plastics;  Laterality: Bilateral;                right expander removal and replacement. 01/02/2023: TEE WITHOUT CARDIOVERSION; N/A     Comment:  Procedure: TRANSESOPHAGEAL ECHOCARDIOGRAM (TEE);                Surgeon: Perla Evalene PARAS, MD;  Location: [facility];                Service: Cardiovascular;  Laterality:  N/A; No date: TUBAL LIGATION     Reproductive/Obstetrics negative OB ROS                              Anesthesia Physical Anesthesia Plan  ASA: 2  Anesthesia Plan: General ETT   Post-op Pain Management: Toradol  IV (intra-op)*, Ofirmev  IV (intra-op)*, Dilaudid  IV and Ketamine  IV*   Induction: Intravenous  PONV Risk Score and Plan: 3 and Ondansetron , Dexamethasone , Midazolam  and Treatment may vary due to age or medical condition  Airway Management Planned: Oral ETT  Additional Equipment:   Intra-op Plan:   Post-operative Plan: Extubation in OR  Informed Consent: I have reviewed the patients History and Physical, chart, labs and discussed the procedure including the risks, benefits and alternatives for the proposed anesthesia with the patient or authorized representative who has indicated his/her understanding and acceptance.     Dental Advisory Given  Plan Discussed with: Anesthesiologist, CRNA and Surgeon  Anesthesia Plan Comments: (Patient consented for risks of anesthesia including but not limited to:  - adverse reactions to medications - damage to eyes, teeth, lips or other oral mucosa - nerve damage due to positioning  - sore throat or hoarseness - Damage to heart, brain, nerves, lungs, other parts of body or loss of life  Patient voiced understanding and assent.)        Anesthesia Quick Evaluation

## 2024-01-29 ENCOUNTER — Encounter: Payer: Self-pay | Admitting: Obstetrics and Gynecology

## 2024-01-30 ENCOUNTER — Inpatient Hospital Stay

## 2024-01-30 VITALS — BP 104/80 | HR 91 | Temp 98.6°F | Resp 16

## 2024-01-30 DIAGNOSIS — Z5112 Encounter for antineoplastic immunotherapy: Secondary | ICD-10-CM | POA: Diagnosis not present

## 2024-01-30 DIAGNOSIS — D500 Iron deficiency anemia secondary to blood loss (chronic): Secondary | ICD-10-CM

## 2024-01-30 MED ORDER — IRON SUCROSE 20 MG/ML IV SOLN
200.0000 mg | Freq: Once | INTRAVENOUS | Status: AC
  Administered 2024-01-30: 200 mg via INTRAVENOUS
  Filled 2024-01-30: qty 10

## 2024-01-30 NOTE — Patient Instructions (Signed)

## 2024-02-02 ENCOUNTER — Telehealth: Payer: Self-pay | Admitting: *Deleted

## 2024-02-02 ENCOUNTER — Inpatient Hospital Stay

## 2024-02-02 ENCOUNTER — Inpatient Hospital Stay: Admitting: Hospice and Palliative Medicine

## 2024-02-02 ENCOUNTER — Encounter: Payer: Self-pay | Admitting: Oncology

## 2024-02-02 ENCOUNTER — Other Ambulatory Visit: Payer: Self-pay | Admitting: Hospice and Palliative Medicine

## 2024-02-02 ENCOUNTER — Encounter: Payer: Self-pay | Admitting: Hospice and Palliative Medicine

## 2024-02-02 VITALS — BP 139/56 | HR 65 | Temp 98.4°F | Resp 16 | Wt 132.0 lb

## 2024-02-02 DIAGNOSIS — R197 Diarrhea, unspecified: Secondary | ICD-10-CM

## 2024-02-02 DIAGNOSIS — C50919 Malignant neoplasm of unspecified site of unspecified female breast: Secondary | ICD-10-CM

## 2024-02-02 DIAGNOSIS — Z5112 Encounter for antineoplastic immunotherapy: Secondary | ICD-10-CM | POA: Diagnosis not present

## 2024-02-02 DIAGNOSIS — Z95828 Presence of other vascular implants and grafts: Secondary | ICD-10-CM

## 2024-02-02 LAB — CBC WITH DIFFERENTIAL (CANCER CENTER ONLY)
Abs Immature Granulocytes: 0.02 K/uL (ref 0.00–0.07)
Basophils Absolute: 0 K/uL (ref 0.0–0.1)
Basophils Relative: 0 %
Eosinophils Absolute: 0.1 K/uL (ref 0.0–0.5)
Eosinophils Relative: 2 %
HCT: 38.6 % (ref 36.0–46.0)
Hemoglobin: 12.1 g/dL (ref 12.0–15.0)
Immature Granulocytes: 0 %
Lymphocytes Relative: 18 %
Lymphs Abs: 1.2 K/uL (ref 0.7–4.0)
MCH: 24.9 pg — ABNORMAL LOW (ref 26.0–34.0)
MCHC: 31.3 g/dL (ref 30.0–36.0)
MCV: 79.6 fL — ABNORMAL LOW (ref 80.0–100.0)
Monocytes Absolute: 0.4 K/uL (ref 0.1–1.0)
Monocytes Relative: 6 %
Neutro Abs: 4.7 K/uL (ref 1.7–7.7)
Neutrophils Relative %: 74 %
Platelet Count: 208 K/uL (ref 150–400)
RBC: 4.85 MIL/uL (ref 3.87–5.11)
RDW: 19.2 % — ABNORMAL HIGH (ref 11.5–15.5)
WBC Count: 6.3 K/uL (ref 4.0–10.5)
nRBC: 0 % (ref 0.0–0.2)

## 2024-02-02 LAB — CMP (CANCER CENTER ONLY)
ALT: 20 U/L (ref 0–44)
AST: 20 U/L (ref 15–41)
Albumin: 4.2 g/dL (ref 3.5–5.0)
Alkaline Phosphatase: 114 U/L (ref 38–126)
Anion gap: 9 (ref 5–15)
BUN: 7 mg/dL (ref 6–20)
CO2: 26 mmol/L (ref 22–32)
Calcium: 9.6 mg/dL (ref 8.9–10.3)
Chloride: 103 mmol/L (ref 98–111)
Creatinine: 0.59 mg/dL (ref 0.44–1.00)
GFR, Estimated: >60 mL/min
Glucose, Bld: 98 mg/dL (ref 70–99)
Potassium: 4.5 mmol/L (ref 3.5–5.1)
Sodium: 138 mmol/L (ref 135–145)
Total Bilirubin: 0.3 mg/dL (ref 0.0–1.2)
Total Protein: 7.7 g/dL (ref 6.5–8.1)

## 2024-02-02 LAB — MAGNESIUM: Magnesium: 2.3 mg/dL (ref 1.7–2.4)

## 2024-02-02 LAB — SURGICAL PATHOLOGY

## 2024-02-02 MED ORDER — SODIUM CHLORIDE 0.9 % IV SOLN
INTRAVENOUS | Status: DC
  Filled 2024-02-02: qty 250

## 2024-02-02 MED ORDER — ALTEPLASE 2 MG IJ SOLR
2.0000 mg | Freq: Once | INTRAMUSCULAR | Status: AC | PRN
  Administered 2024-02-02: 2 mg
  Filled 2024-02-02: qty 2

## 2024-02-02 NOTE — Telephone Encounter (Signed)
 Pt called triage line as well. She was advised on lab/ smc/ possible fluid encounter today. She can come at 1pm. See phone note. Rn spoke with dr. Babara.

## 2024-02-02 NOTE — Progress Notes (Signed)
 No IVF needed today and pt senthome.

## 2024-02-02 NOTE — Progress Notes (Signed)
 "  [facility] at [facility] [phone] [phone]  Patient Care Team: Sherial Bail, MD as PCP - General (Internal Medicine) Georgina Shasta POUR, RN as Oncology Nurse Navigator Babara Call, MD as Consulting Physician (Oncology) Lenn Aran, MD as Consulting Physician (Radiation Oncology)   NAME OF PATIENT: Penny Gomez  [id number]  27-Feb-1985   DATE OF VISIT: 02/02/2024  REASON FOR CONSULT: Penny Gomez is a 38 y.o. female with multiple medical problems including 3B triple negative left breast cancer status post 4 cycles neoadjuvant chemotherapy with CarboTaxol Keytruda  followed by 4 cycles of AC Keytruda .  Patient status post bilateral mastectomy.  Status post adjuvant RT.  Patient now on treatment with adjuvant Keytruda .  INTERVAL HISTORY: Patient received cycle 5 Keytruda  on 01/23/2024.  She presents to [facility] today for evaluation of diarrhea.  Patient says that she has had 2 to 3 weeks of diarrhea, starting when she last received Keytruda .  She says frequency varies but she is generally having 3-5 loose, watery stools daily.  Diarrhea is triggered by oral intake.  She occasionally has abdominal cramping but denies distention or abdominal pain.  No nausea or vomiting.  No fever or chills.  Denies any neurologic complaints. Denies recent fevers or illnesses. Denies any easy bleeding or bruising. Reports good appetite and denies weight loss. Denies chest pain. Denies urinary complaints. Patient offers no further specific complaints today.   PAST MEDICAL HISTORY: Past Medical History:  Diagnosis Date   Acute pulmonary embolism (HCC)    BRCA1 gene mutation positive    Breast lump    Chronic anticoagulation    Endocarditis    Fibroid, uterine    Immunosuppressed due to chemotherapy    Microcytic anemia    MSSA bacteremia    Multifocal pneumonia    Sepsis (HCC)    due to port a cath   STD (sexually  transmitted disease)    From Medical Hx;   Thrombocytopenia    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   Vascular port complication     PAST SURGICAL HISTORY:  Past Surgical History:  Procedure Laterality Date   BREAST BIOPSY Right 05/20/2019   Affirm Bx- X- clip, neg   BREAST BIOPSY Left 10/02/2022   US  Bx, path pending   BREAST BIOPSY Left 10/02/2022   Us  Bx Node- path pending   BREAST BIOPSY Left 10/02/2022   US  LT BREAST BX W LOC DEV 1ST LESION IMG BX SPEC US  GUIDE 10/02/2022 [facility]   BREAST RECONSTRUCTION Bilateral 07/24/2023   Procedure: RECONSTRUCTION, BREAST;  Surgeon: Lowery Estefana RAMAN, DO;  Location: [facility];  Service: Plastics;  Laterality: Bilateral;  BILATERAL IMMEDIATE BREAST RECONSTRUCTION WITH EXPANDER AND FLEX HD PLACEMENT   CESAREAN SECTION     x 2   CYSTOSCOPY N/A 01/28/2024   Procedure: CYSTOSCOPY;  Surgeon: Verdon Keen, MD;  Location: [facility];  Service: Gynecology;  Laterality: N/A;   IR CV LINE INJECTION  01/27/2024   LAPAROSCOPIC BILATERAL SALPINGECTOMY Bilateral 02/09/2022   Procedure: LAPAROSCOPIC BILATERAL SALPINGECTOMY;  Surgeon: Connell Davies, MD;  Location: [facility];  Service: Gynecology;  Laterality: Bilateral;   MASTECTOMY W/ SENTINEL NODE BIOPSY Bilateral 07/24/2023   Procedure: MASTECTOMY WITH SENTINEL LYMPH NODE BIOPSY;  Surgeon: Tye Millet, DO;  Location: [facility];  Service: General;  Laterality: Bilateral;  SN biopsy left only   PORTACATH PLACEMENT Right 10/17/2022   Procedure: INSERTION PORT-A-CATH;  Surgeon: Desiderio Schanz, MD;  Location: [facility];  Service: General;  Laterality: Right;   PORTACATH PLACEMENT Left 02/10/2023   Procedure: INSERTION PORT-A-CATH;  Surgeon: Tye Millet, DO;  Location: [facility];  Service: General;  Laterality: Left;   REMOVAL OF BILATERAL TISSUE EXPANDERS WITH PLACEMENT OF BILATERAL BREAST IMPLANTS Bilateral 09/11/2023   Procedure: REMOVAL, TISSUE EXPANDER, BREAST, BILATERAL, WITH BILATERAL  IMPLANT IMPLANT INSERTION;  Surgeon: Lowery Estefana RAMAN, DO;  Location:  [facility];  Service: Plastics;  Laterality: Bilateral;  right expander removal and replacement.   ROBOTIC ASSISTED LAPAROSCOPIC LYSIS OF ADHESION N/A 01/28/2024   Procedure: LYSIS, ADHESIONS, ROBOT-ASSISTED, LAPAROSCOPIC;  Surgeon: Verdon Keen, MD;  Location: [facility];  Service: Gynecology;  Laterality: N/A;   ROBOTIC ASSISTED TOTAL HYSTERECTOMY WITH BILATERAL SALPINGO OOPHERECTOMY Bilateral 01/28/2024   Procedure: HYSTERECTOMY, TOTAL, ROBOT-ASSISTED, LAPAROSCOPIC, WITH BILATERAL SALPINGO-OOPHORECTOMY;  Surgeon: Verdon Keen, MD;  Location: [facility];  Service: Gynecology;  Laterality: Bilateral;  Vaginal Morcellation, Ureteral Lysis, and Modified McCall's   TEE WITHOUT CARDIOVERSION N/A 01/02/2023   Procedure: TRANSESOPHAGEAL ECHOCARDIOGRAM (TEE);  Surgeon: Gollan, Timothy J, MD;  Location: [facility];  Service: Cardiovascular;  Laterality: N/A;   TUBAL LIGATION      HEMATOLOGY/ONCOLOGY HISTORY:  Oncology History  Invasive carcinoma of breast (HCC)  07/24/2022 Mammogram   She noticed left breast mass for 1 month.  Bilateral diagnostic mammogram showed Suspicious palpable left breast mass 8 o'clock position. Cortically thickened left axillary lymph node   10/10/2022 Initial Diagnosis   Invasive carcinoma of breast (HCC)  10/02/2022  Left breast mass biopsy and left axillary lymph node biopsy.  Diagnosis 1. Breast, left, needle core biopsy, 8 o'clock 6 cmfn, heart clip - INVASIVE MAMMARY CARCINOMA, NO SPECIAL TYPE. - TUBULE FORMATION: SCORE 3 - NUCLEAR PLEOMORPHISM: SCORE 3 - MITOTIC COUNT: SCORE 3 - TOTAL SCORE: 9 - OVERALL GRADE: 3 - LYMPHOVASCULAR INVASION: NOT IDENTIFIED - CANCER LENGTH: 11 MM - CALCIFICATIONS: NOT IDENTIFIED - DUCTAL CARCINOMA IN SITU: PRESENT, HIGH-GRADE - ER-, PR- HER2 - [IHC 1+] 2. Lymph node, needle/core biopsy, left axillary, hydromark - LYMPH NODE WITH REACTIVE  CHANGES; NEGATIVE FOR MALIGNANCY.  Menarche at age of 7 First live birth at age of 25 OCP use: no History of hysterectomy: no Menopausal status: premenopausal History of HRT use: no History of chest radiation: no Number of previous breast biopsies:  right breast biopsy 05/20/2019 negative for malignancy Strong family history of cancer mother breast cancer diagnosed in 64s, maternal aunts x 2 breast cancer, maternal cousins breast cancer x 2, maternal grandmother pancreatic cancer.    10/10/2022 Cancer Staging   Staging form: Breast, AJCC 8th Edition - Clinical stage from 10/10/2022: Stage IIIB (cT3, cN0, cM0, G3, ER-, PR-, HER2-) - Signed by Babara Call, MD on 10/18/2022 Stage prefix: Initial diagnosis Nuclear grade: G3 Histologic grading system: 3 grade system   10/17/2022 Imaging   Bilateral MRI breasts w wo contrast  1. In total, there is approximately 6.7 cm of abnormal enhancement, predominantly in the lower-inner inferior left breast, with the ultrasound-guided biopsy marking clip centrally positioned within the enhancement. There are scattered small enhancing masses in the lower inner and lower outer anterior left (included in the measurement above).   2.  No evidence of right breast malignancy.   3. The axillary lymph nodes are not included within the field of view of this MRI for adequate assessment of lymphadenopathy.    10/17/2022 Procedure   S/p medi port placement.    10/25/2022 - 10/25/2022 Chemotherapy   Patient is on Treatment Plan :  BREAST ADJUVANT DOSE DENSE AC q14d / PACLitaxel  q7d     10/25/2022 - 05/19/2023 Chemotherapy   Patient is on Treatment Plan : BREAST Pembrolizumab  (200) D1 + Carboplatin  (5) D1 + Paclitaxel  (80) D1,8,15 q21d X 4 cycles / Pembrolizumab  (200) D1 + AC D1 q21d x 4 cycles      Genetic Testing   Single pathogenic variant in BRCA1 called c.190T>G identified on the [facility] Multi-Cancer+RNA panel. The remainder of testing was normal. The report date is  11/11/2022.  The Multi-Cancer + RNA Panel offered by [facility] includes sequencing and/or deletion/duplication analysis of the following 70 genes:  AIP*, ALK, APC*, ATM*, AXIN2*, BAP1*, BARD1*, BLM*, BMPR1A*, BRCA1*, BRCA2*, BRIP1*, CDC73*, CDH1*, CDK4, CDKN1B*, CDKN2A, CHEK2*, CTNNA1*, DICER1*, EPCAM, EGFR, FH*, FLCN*, GREM1, HOXB13, KIT, LZTR1, MAX*, MBD4, MEN1*, MET, MITF, MLH1*, MSH2*, MSH3*, MSH6*, MUTYH*, NF1*, NF2*, NTHL1*, PALB2*, PDGFRA, PMS2*, POLD1*, POLE*, POT1*, PRKAR1A*, PTCH1*, PTEN*, RAD51C*, RAD51D*, RB1*, RET, SDHA*, SDHAF2*, SDHB*, SDHC*, SDHD*, SMAD4*, SMARCA4*, SMARCB1*, SMARCE1*, STK11*, SUFU*, TMEM127*, TP53*, TSC1*, TSC2*, VHL*. RNA analysis is performed for * genes.   11/24/2022 - 11/25/2022 Hospital Admission   Admitted due to acute pulmonary embolism, lower extremity US  negative for DVT.  Patietn was started on heparin  gtt and transitioned to Eliquis  at discharge.    02/10/2023 Procedure   S/p port placement.    03/10/2023 Imaging   MRI bilateral breast w wo contrast  No abnormal enhancement in the left breast consistent with response to neoadjuvant chemotherapy.   07/24/2023 Surgery   Patient underwent bilateral mastectomy 1. Breast, simple mastectomy, left, long stitch lateral, short stitch superior :      - CHANGES CONSISTENT WITH NEOADJUVANT THERAPY, NEGATIVE FOR RESIDUAL MALIGNANCY.      - SEE NOTE.      - SCLEROSING ADENOSIS AND SECRETORY CHANGE.      - BIOPSY SITE CHANGE WITH CLIP.      - UNREMARKABLE NIPPLE AND SKIN.       2. Breast, simple mastectomy, right, long stitch lateral, short stitch superior :      - SCLEROSING ADENOSIS, FIBROUS STROMA, AND FIBROADENOMATOID CHANGE.      - UNREMARKABLE NIPPLE AND SKIN.      - NEGATIVE FOR ATYPIA AND MALIGNANCY.       3. Lymph node, sentinel, biopsy, left axillary #1 :      - TWO LYMPH NODES NEGATIVE FOR MALIGNANCY (0/2).      - SEE NOTE.      - BIOPSY SITE CHANGE INVOLVING ONE LYMPH NODE.      - FIBROUS  SCARRING / CHANGES SUGGESTIVE OF NEOADJUVANT THERAPY ARE NOT      IDENTIFIED.       4. Breast, excision, left lateral margin :      - UNREMARKABLE BREAST TISSUE.      - NEGATIVE FOR ATYPIA AND MALIGNANCY.  The findings in the left simple mastectomy (specimen 1) are       consistent with a complete pathologic response to neoadjuvant therapy.Based on       these findings and those in the left axillary sentinel lymph node excision       (specimen 3) the AJCC staging is ypT0 ypN0 (sn)     08/22/2023 -  Chemotherapy   Patient is on Treatment Plan : BREAST Pembrolizumab  (200) q21d x 27 weeks       ALLERGIES:  has no known allergies.  MEDICATIONS:  Current Outpatient Medications  Medication Sig Dispense Refill   Calcium -Phosphorus-Vitamin D (CALCIUM  GUMMIES  PO) Take 2 each by mouth in the morning.     ondansetron  (ZOFRAN -ODT) 4 MG disintegrating tablet Take 1 tablet (4 mg total) by mouth every 8 (eight) hours as needed for up to 7 days for nausea or vomiting. 20 tablet 0   oxyCODONE  (OXY IR/ROXICODONE ) 5 MG immediate release tablet Take 1 tablet (5 mg total) by mouth every 4 (four) hours as needed for severe pain (pain score 7-10). 15 tablet 0   senna (SENOKOT) 8.6 MG TABS tablet Take 2 tablets (17.2 mg total) by mouth daily. (Patient not taking: Reported on 02/02/2024) 120 tablet 2   No current facility-administered medications for this visit.    VITAL SIGNS: BP (!) 139/56 (BP Location: Left Arm, Patient Position: Sitting, Cuff Size: Normal)   Pulse 65   Temp 98.4 F (36.9 C) (Tympanic)   Resp 16   Wt 132 lb (59.9 kg)   LMP 01/01/2024 (Approximate)   SpO2 100%   BMI 24.94 kg/m  Filed Weights   02/02/24 1023  Weight: 132 lb (59.9 kg)    Estimated body mass index is 24.94 kg/m as calculated from the following:   Height as of 01/21/24: 5' 1 (1.549 m).   Weight as of this encounter: 132 lb (59.9 kg).  LABS: CBC:    Component Value Date/Time   WBC 3.9 (L) 01/23/2024 0853    WBC 5.9 08/17/2023 0019   HGB 10.3 (L) 01/23/2024 0853   HGB 11.3 (L) 02/06/2012 1022   HCT 33.0 (L) 01/23/2024 0853   HCT 34.1 (L) 02/08/2012 0514   PLT 222 01/23/2024 0853   PLT 169 02/06/2012 1022   MCV 79.5 (L) 01/23/2024 0853   MCV 88 02/06/2012 1022   NEUTROABS 2.5 01/23/2024 0853   NEUTROABS 6.5 02/06/2012 1022   LYMPHSABS 1.2 01/23/2024 0853   LYMPHSABS 1.4 02/06/2012 1022   MONOABS 0.3 01/23/2024 0853   MONOABS 0.7 02/06/2012 1022   EOSABS 0.0 01/23/2024 0853   EOSABS 0.1 02/06/2012 1022   BASOSABS 0.0 01/23/2024 0853   BASOSABS 0.0 02/06/2012 1022   Comprehensive Metabolic Panel:    Component Value Date/Time   NA 139 01/23/2024 0853   NA 138 12/04/2011 1032   K 4.1 01/23/2024 0853   K 3.8 12/04/2011 1032   CL 106 01/23/2024 0853   CL 106 12/04/2011 1032   CO2 24 01/23/2024 0853   CO2 24 12/04/2011 1032   BUN 10 01/23/2024 0853   BUN 5 (L) 12/04/2011 1032   CREATININE 0.63 01/23/2024 0853   CREATININE 0.38 (L) 12/04/2011 1032   GLUCOSE 83 01/23/2024 0853   GLUCOSE 77 12/04/2011 1032   CALCIUM  9.1 01/23/2024 0853   CALCIUM  8.5 12/04/2011 1032   AST 26 01/23/2024 0853   ALT 35 01/23/2024 0853   ALT 17 12/04/2011 1032   ALKPHOS 107 01/23/2024 0853   ALKPHOS 79 12/04/2011 1032   BILITOT 0.4 01/23/2024 0853   PROT 7.0 01/23/2024 0853   PROT 6.8 12/04/2011 1032   ALBUMIN 4.0 01/23/2024 0853   ALBUMIN 2.8 (L) 12/04/2011 1032    RADIOGRAPHIC STUDIES: IR CV Line Injection Result Date: 01/27/2024 CLINICAL DATA:  History of breast carcinoma and status post prior left-sided Port-A-Cath placement on 02/10/2023. The port demonstrates malfunctioning with poor blood return after access. EXAM: CONTRAST INJECTION OF PORT A CATH UNDER FLUOROSCOPY CONTRAST:  5 mL Omnipaque  300 FLUOROSCOPY: Radiation exposure index: 21 mGy Kerma PROCEDURE: Contrast was administered via the indwelling port after it was accessed. Fluoroscopic spot images were obtained  of the catheter during  injection. FINDINGS: Initial fluoroscopy demonstrates a tunneled Port-A-Cath on the left with jugular access. The tunneled portion of the catheter shows some redundancy in the subcutaneous to soft tissues. The catheter tip lies in a transverse orientation in distal aspect of the left brachiocephalic vein just before the origin of the SVC. Contrast injection demonstrates no evidence of contrast extravasation or catheter rupture. There is fibrin sheath material at the tip of the catheter with contrast emanating predominantly along the superior aspect the catheter tip. IMPRESSION: Transversely oriented catheter tip of the Port-A-Cath in the distal left brachiocephalic vein. Fibrin sheath material also at the tip of the catheter. If there continues to be malfunctioning of the Port-A-Cath, catheter revision will likely be necessary. Electronically Signed   By: Marcey Moan M.D.   On: 01/27/2024 16:53    PERFORMANCE STATUS (ECOG) : 1 - Symptomatic but completely ambulatory  Review of Systems Unless otherwise noted, a complete review of systems is negative.  Physical Exam General: NAD Cardiovascular: regular rate and rhythm Pulmonary: clear anterior/posterior fields Abdomen: soft, nontender, + bowel sounds GU: no suprapubic tenderness Extremities: no edema, no joint deformities Skin: no rashes Neurological: nonfocal  IMPRESSION/PLAN: Breast cancer -on treatment with adjuvant Keytruda   Diarrhea -will check GI panel, C. difficile PCR to rule out infectious etiology.  Will also send fecal calprotectin.  If stool studies are negative, can consider antidiarrheals and/or trial of steroids.  CBC and CMP are grossly normal.  Patient does not appear clinically dehydrated and is without significant electrolyte derangements.  ED precautions reviewed in detail.  Can consider GI referral if diarrhea persists without improvement with supportive care.  Patient unable to provide stool sample today so was sent  home with kit.  Case and plan discussed with Dr. Babara.  MD follow-up 2 weeks   Patient expressed understanding and was in agreement with this plan. She also understands that She can call clinic at any time with any questions, concerns, or complaints.   Thank you for allowing me to participate in the care of this very pleasant patient.   Time Total: 25 minutes  Visit consisted of counseling and education dealing with the complex and emotionally intense issues of symptom management in the setting of serious illness.Greater than 50%  of this time was spent counseling and coordinating care related to the above assessment and plan.  Signed by: Fonda Mower, PhD, NP-C     "

## 2024-02-02 NOTE — Telephone Encounter (Signed)
 Symptom: Diarrhea,malodorous       NURSING SYMPTOM TRIAGE ASSESSMENT & DISPOSITION  Mode of interaction:  Telephone Date of symptom triage interaction:  02/02/2024 Time of symptom triage interaction:  [id number]  Cancer diagnosis:  Invasive carcinoma of breast (HCC) Current anti-cancer treatment:  Patient is on Treatment Plan :  BREAST Pembrolizumab  (200) q21d x 27 weeks  Last treatment date:  01/23/2024   Symptom Triage Protocol:  Diarrhea  History of the problem:  Diarrhea Frequency of stools:  Frequency after she eats.  Consistency:  Liquid  Color:  Green color  Odor:  Yes-malodorous.  Undigested food or fat:  No  Mucous present:  No  Blood present:  No  Number of stools in past 24 hours:  3  Weight loss:  Yes 3-4 pounds  Urine output:  Normal voiding  Urine Character:  clear  Signs of dehydration:  none  Precipitating factors:   *patient on keytruda -- last dose 01/23/2024   Onset:  Symptom onset has been acute since 01/30/2024. Severity is described as mild-moderate. Course of her symptoms over time is acute.  Note: recent hysterectomy on 01/28/2024. Symptoms started after the hysterectomy per patient.   Duration:  Frequency after she eats  Relieving factors:  Has not tried immodium  Associated symptoms:  Weight loss-mild  Changes in ADLs:  no     Diet History  Food intolerance:  none  Food aversions:  none  Food allergies:  none  Consumption of well water :  No  Ingestion of unpasteurized milk or milk products:  No  Consumption of raw seafood:  No  Recent travel abroad:  No  Exposure to farm animals or animal feces:  No        Triage Nurse Guidance Signs and Symptoms Action  Grossly bloody stool   Signs of severe dehydration  Severe lethargy or weakness  Heart palpitations  Decreased urine output  Sunken eyes  Orthostatic hypotension  Dizziness   Temperature higher than 100.4 F WITH suspect neutropenia   Symptoms of immune-mediated colitis, such  as diarrhea accompanied by blood in the stool, severe abdominal pain, or tenderness   Symptoms of bowel perforation, such as diarrhea associated with severe abdominal pain, fever chills, nausea, and vomiting  Seek emergency care.  Call an ambulance immediately.   Excessive thirst, dry mouth  Temperature higher than 100.4 F WITHOUT suspect neutropenia  Diarrhea for more than five (5) days  More than four to six stools above baseline per day for two days  Swollen or painful abdomen  More than 10 stools per day  Weight loss of more than five (5) pounds since diarrhea began  Continued diarrhea despite antidiarrheal treatment  Decreased turgor; pinched skin does not spring back  Grade 3 or 4 nausea and vomiting  Seek urgent care within 24 hours.   Less than four (4) stools per day  Chronic diarrhea  Other family members with diarrhea  Recent travel to a foreign country  New prescription  Follow homecare instructions.  Notify MD if no improvement.      Provider Consulted  Provider name and credentials: Dr. Babara.   Provider instruction: V/o to have patient come to [facility]. R/o infection with GI panel and c-diff. Labs/ and possible fluids.      Note: Patient can come at 1pm today. She has to take her kids to get immunizations so she is not available this morning. Patient asked if she could get her influenza vaccine this morning. I asked her  to hold off on the influenza vaccine per v/o Dr. Babara until further evaluation.   Patient Instruction  Disclaimer:  Patient specific and [employer] instruction provided verbally and sent through MyChart for active users.    Consider food and fluid modifications: Drink 8-10 eight-ounce glasses of fluids per day (e.g., water , diluted cranberry juice, sports drinks, decaffeinated tea or coffee). Eat foods high in soluble fiber (e.g., bananas, oatmeal, applesauce, skinned turkey, chicken, rice, toast). Consider foods containing pectin, a natural fiber that  decreases diarrhea. These foods include beets, peeled apples, white rice, bananas, baked potatoes without skin, white bread, plain pasta, avocadoes, and asparagus tips. Eat easy-to-digest foods high in protein, calories, and potassium. - Cook all vegetables well. Raw vegetables are difficult to digest. - Eat small, frequent meals. Do not eat large meals. - Eat foods at room temperature, as hot and cold temperature foods may insti-gate diarrhea. Avoid foods high in insoluble fiber (e.g., raw fruits and vegetables, skins, seeds, legumes). Avoid milk and dairy products, caffeine , alcohol, sucrose, and sorbitol. - Avoid greasy, fatty, spicy, or fried foods and foods containing olestra. - Refrain from taking fiber supplements.  Implement a rectal skin care routine: Clean the perineal area well with mild soap and water  or aloe-based baby wipes. Apply barrier ointment (e.g., zinc oxide) for protection. Sitz baths may add comfort.  Examine the rectal area for red, scaly, or broken skin. If this is present, report it to the healthcare provider. Record the frequency, quality, and volume of stools during the course of treatment.  If diarrhea lasts more than 24 hours, notify the healthcare provider.  Also, consult a healthcare provider before taking any over-the-counter antidiarrheal medications. These can be very effective but may not be appropriate for this situation.  If treatment with immunotherapy or targeted therapies has been prescribed, consultation with the healthcare team should begin prior to starting antidiarrheal.  If prescribed, keep track of medications administered--type, amount, and frequency.  Report the Following Problems: Inability to keep fluids down for 24 hours Diarrhea lasting more than 24 hours Dark yellow urine or absence of urine production More than four (4) to six (6) bowel movements above baseline per day for two days in a row Grade 2 or higher nausea and vomiting that  accompanies diarrhea Dizziness Rectal bleeding Temperature greater than 100.4 F Swollen or painful abdomen Red, scaly, or broken skin of the rectal area  Teach Back Method used:  Yes    Nurse Triage Priority:  Urgent (obtain medical orders as indicated with instruction for 8-24 hour or sooner follow-up)   Barriers to Care:  None identified   Nurse Triage Disposition:  In-person follow-up visit:  patient called back to confirm plan of care. She was offered an apt with morning with Sidra, NP instead of this afternoon. Patient can come now and she will r/s her kids apts for another day. Patient scheduled for port lab/ [facility]- [facility] and possible IV fluids.  Of note: patient's had a dye study on 12/16 which demonstrated a fibrin sheath. She had alteplase  inj on 12/12 when she came in for her infusion. RN Hand off given to fluid clinic RN.   Protocol Source:  Ruthine HERO., & Eldonna CANDIE Pee.). (2019). Telephone triage for oncology nurses (3rd ed.). Oncology Nursing [employer].

## 2024-02-03 ENCOUNTER — Other Ambulatory Visit: Payer: Self-pay | Admitting: *Deleted

## 2024-02-03 DIAGNOSIS — R197 Diarrhea, unspecified: Secondary | ICD-10-CM

## 2024-02-03 LAB — GASTROINTESTINAL PANEL BY PCR, STOOL (REPLACES STOOL CULTURE)

## 2024-02-03 NOTE — Addendum Note (Signed)
 Addended by: SHERLEEN FONDA SAUNDERS on: 02/03/2024 04:32 PM   Modules accepted: Orders

## 2024-02-04 ENCOUNTER — Inpatient Hospital Stay

## 2024-02-04 ENCOUNTER — Telehealth: Payer: Self-pay

## 2024-02-04 ENCOUNTER — Other Ambulatory Visit: Payer: Self-pay | Admitting: Hospice and Palliative Medicine

## 2024-02-04 DIAGNOSIS — R197 Diarrhea, unspecified: Secondary | ICD-10-CM

## 2024-02-04 NOTE — Telephone Encounter (Signed)
 Spoke to patient, unable to make lab appt today. Patient would like to r/s for Monday the 29th. Patient has been rescheduled.

## 2024-02-04 NOTE — Telephone Encounter (Signed)
 Spoke with patient on behalf of Penny Gomez to follow up with patients symptoms and inform her of PCR stool came back negative but if still experiencing diarrhea we would need to get another sample. Patient states she is feeling ok but still having diarrhea. Patient would like to come in for lab appt today at 11 am. Patient has been scheduled for lab. Patient verbalized understanding.

## 2024-02-06 LAB — CALPROTECTIN, FECAL: Calprotectin, Fecal: 67 ug/g (ref 0–120)

## 2024-02-09 ENCOUNTER — Inpatient Hospital Stay

## 2024-02-09 ENCOUNTER — Telehealth: Payer: Self-pay

## 2024-02-09 ENCOUNTER — Encounter: Payer: Self-pay | Admitting: Nurse Practitioner

## 2024-02-09 ENCOUNTER — Other Ambulatory Visit: Payer: Self-pay

## 2024-02-09 ENCOUNTER — Inpatient Hospital Stay: Admitting: Nurse Practitioner

## 2024-02-09 ENCOUNTER — Encounter: Payer: Self-pay | Admitting: Oncology

## 2024-02-09 VITALS — BP 127/75 | HR 73 | Temp 97.8°F | Resp 16 | Wt 134.0 lb

## 2024-02-09 DIAGNOSIS — R197 Diarrhea, unspecified: Secondary | ICD-10-CM

## 2024-02-09 DIAGNOSIS — Z452 Encounter for adjustment and management of vascular access device: Secondary | ICD-10-CM

## 2024-02-09 DIAGNOSIS — Z95828 Presence of other vascular implants and grafts: Secondary | ICD-10-CM

## 2024-02-09 DIAGNOSIS — Z5112 Encounter for antineoplastic immunotherapy: Secondary | ICD-10-CM | POA: Diagnosis not present

## 2024-02-09 LAB — COMPREHENSIVE METABOLIC PANEL WITH GFR
ALT: 32 U/L (ref 0–44)
AST: 31 U/L (ref 15–41)
Albumin: 4.1 g/dL (ref 3.5–5.0)
Alkaline Phosphatase: 108 U/L (ref 38–126)
Anion gap: 12 (ref 5–15)
BUN: 7 mg/dL (ref 6–20)
CO2: 24 mmol/L (ref 22–32)
Calcium: 9.4 mg/dL (ref 8.9–10.3)
Chloride: 105 mmol/L (ref 98–111)
Creatinine, Ser: 0.57 mg/dL (ref 0.44–1.00)
GFR, Estimated: >60 mL/min
Glucose, Bld: 63 mg/dL — ABNORMAL LOW (ref 70–99)
Potassium: 3.6 mmol/L (ref 3.5–5.1)
Sodium: 141 mmol/L (ref 135–145)
Total Bilirubin: <0.2 mg/dL (ref 0.0–1.2)
Total Protein: 7.4 g/dL (ref 6.5–8.1)

## 2024-02-09 LAB — CBC WITH DIFFERENTIAL/PLATELET
Abs Immature Granulocytes: 0.04 K/uL (ref 0.00–0.07)
Basophils Absolute: 0 K/uL (ref 0.0–0.1)
Basophils Relative: 0 %
Eosinophils Absolute: 0 K/uL (ref 0.0–0.5)
Eosinophils Relative: 0 %
HCT: 36.8 % (ref 36.0–46.0)
Hemoglobin: 11.7 g/dL — ABNORMAL LOW (ref 12.0–15.0)
Immature Granulocytes: 1 %
Lymphocytes Relative: 23 %
Lymphs Abs: 1.4 K/uL (ref 0.7–4.0)
MCH: 25.3 pg — ABNORMAL LOW (ref 26.0–34.0)
MCHC: 31.8 g/dL (ref 30.0–36.0)
MCV: 79.7 fL — ABNORMAL LOW (ref 80.0–100.0)
Monocytes Absolute: 0.2 K/uL (ref 0.1–1.0)
Monocytes Relative: 4 %
Neutro Abs: 4.3 K/uL (ref 1.7–7.7)
Neutrophils Relative %: 72 %
Platelets: 241 K/uL (ref 150–400)
RBC: 4.62 MIL/uL (ref 3.87–5.11)
RDW: 18.6 % — ABNORMAL HIGH (ref 11.5–15.5)
WBC: 5.9 K/uL (ref 4.0–10.5)
nRBC: 0 % (ref 0.0–0.2)

## 2024-02-09 LAB — MAGNESIUM: Magnesium: 2 mg/dL (ref 1.7–2.4)

## 2024-02-09 NOTE — Progress Notes (Signed)
 "  Symptom Management Clinic  [facility] at [facility] A Department of the [facility]. [facility] [address] [place], [place] [address] [phone] (phone) [phone] (fax)  Patient Care Team: Sherial Bail, MD as PCP - General (Internal Medicine) Georgina Shasta POUR, RN as Oncology Nurse Navigator Babara Call, MD as Consulting Physician (Oncology) Lenn Aran, MD as Consulting Physician (Radiation Oncology)   Name of the patient: Penny Gomez  [id number]  Jan 25, 1986   Date of visit: 02/09/2024  Diagnosis- Breast Cancer  Chief complaint/ Reason for visit- Diarrhea  Heme/Onc history:  Oncology History  Invasive carcinoma of breast (HCC)  07/24/2022 Mammogram   She noticed left breast mass for 1 month.  Bilateral diagnostic mammogram showed Suspicious palpable left breast mass 8 o'clock position. Cortically thickened left axillary lymph node   10/10/2022 Initial Diagnosis   Invasive carcinoma of breast (HCC)  10/02/2022  Left breast mass biopsy and left axillary lymph node biopsy.  Diagnosis 1. Breast, left, needle core biopsy, 8 o'clock 6 cmfn, heart clip - INVASIVE MAMMARY CARCINOMA, NO SPECIAL TYPE. - TUBULE FORMATION: SCORE 3 - NUCLEAR PLEOMORPHISM: SCORE 3 - MITOTIC COUNT: SCORE 3 - TOTAL SCORE: 9 - OVERALL GRADE: 3 - LYMPHOVASCULAR INVASION: NOT IDENTIFIED - CANCER LENGTH: 11 MM - CALCIFICATIONS: NOT IDENTIFIED - DUCTAL CARCINOMA IN SITU: PRESENT, HIGH-GRADE - ER-, PR- HER2 - [IHC 1+] 2. Lymph node, needle/core biopsy, left axillary, hydromark - LYMPH NODE WITH REACTIVE CHANGES; NEGATIVE FOR MALIGNANCY.  Menarche at age of 47 First live birth at age of 75 OCP use: no History of hysterectomy: no Menopausal status: premenopausal History of HRT use: no History of chest radiation: no Number of previous breast biopsies:  right breast biopsy 05/20/2019 negative for malignancy Strong family history of cancer mother breast  cancer diagnosed in 41s, maternal aunts x 2 breast cancer, maternal cousins breast cancer x 2, maternal grandmother pancreatic cancer.    10/10/2022 Cancer Staging   Staging form: Breast, AJCC 8th Edition - Clinical stage from 10/10/2022: Stage IIIB (cT3, cN0, cM0, G3, ER-, PR-, HER2-) - Signed by Babara Call, MD on 10/18/2022 Stage prefix: Initial diagnosis Nuclear grade: G3 Histologic grading system: 3 grade system   10/17/2022 Imaging   Bilateral MRI breasts w wo contrast  1. In total, there is approximately 6.7 cm of abnormal enhancement, predominantly in the lower-inner inferior left breast, with the ultrasound-guided biopsy marking clip centrally positioned within the enhancement. There are scattered small enhancing masses in the lower inner and lower outer anterior left (included in the measurement above).   2.  No evidence of right breast malignancy.   3. The axillary lymph nodes are not included within the field of view of this MRI for adequate assessment of lymphadenopathy.    10/17/2022 Procedure   S/p medi port placement.    10/25/2022 - 10/25/2022 Chemotherapy   Patient is on Treatment Plan : BREAST ADJUVANT DOSE DENSE AC q14d / PACLitaxel  q7d     10/25/2022 - 05/19/2023 Chemotherapy   Patient is on Treatment Plan : BREAST Pembrolizumab  (200) D1 + Carboplatin  (5) D1 + Paclitaxel  (80) D1,8,15 q21d X 4 cycles / Pembrolizumab  (200) D1 + AC D1 q21d x 4 cycles      Genetic Testing   Single pathogenic variant in BRCA1 called c.190T>G identified on the [facility] Multi-Cancer+RNA panel. The remainder of testing was normal. The report date is 11/11/2022.  The Multi-Cancer + RNA Panel offered by [facility] includes sequencing and/or deletion/duplication analysis  of the following 70 genes:  AIP*, ALK, APC*, ATM*, AXIN2*, BAP1*, BARD1*, BLM*, BMPR1A*, BRCA1*, BRCA2*, BRIP1*, CDC73*, CDH1*, CDK4, CDKN1B*, CDKN2A, CHEK2*, CTNNA1*, DICER1*, EPCAM, EGFR, FH*, FLCN*, GREM1, HOXB13, KIT, LZTR1, MAX*, MBD4, MEN1*,  MET, MITF, MLH1*, MSH2*, MSH3*, MSH6*, MUTYH*, NF1*, NF2*, NTHL1*, PALB2*, PDGFRA, PMS2*, POLD1*, POLE*, POT1*, PRKAR1A*, PTCH1*, PTEN*, RAD51C*, RAD51D*, RB1*, RET, SDHA*, SDHAF2*, SDHB*, SDHC*, SDHD*, SMAD4*, SMARCA4*, SMARCB1*, SMARCE1*, STK11*, SUFU*, TMEM127*, TP53*, TSC1*, TSC2*, VHL*. RNA analysis is performed for * genes.   11/24/2022 - 11/25/2022 Hospital Admission   Admitted due to acute pulmonary embolism, lower extremity US  negative for DVT.  Patietn was started on heparin  gtt and transitioned to Eliquis  at discharge.    02/10/2023 Procedure   S/p port placement.    03/10/2023 Imaging   MRI bilateral breast w wo contrast  No abnormal enhancement in the left breast consistent with response to neoadjuvant chemotherapy.   07/24/2023 Surgery   Patient underwent bilateral mastectomy 1. Breast, simple mastectomy, left, long stitch lateral, short stitch superior :      - CHANGES CONSISTENT WITH NEOADJUVANT THERAPY, NEGATIVE FOR RESIDUAL MALIGNANCY.      - SEE NOTE.      - SCLEROSING ADENOSIS AND SECRETORY CHANGE.      - BIOPSY SITE CHANGE WITH CLIP.      - UNREMARKABLE NIPPLE AND SKIN.       2. Breast, simple mastectomy, right, long stitch lateral, short stitch superior :      - SCLEROSING ADENOSIS, FIBROUS STROMA, AND FIBROADENOMATOID CHANGE.      - UNREMARKABLE NIPPLE AND SKIN.      - NEGATIVE FOR ATYPIA AND MALIGNANCY.       3. Lymph node, sentinel, biopsy, left axillary #1 :      - TWO LYMPH NODES NEGATIVE FOR MALIGNANCY (0/2).      - SEE NOTE.      - BIOPSY SITE CHANGE INVOLVING ONE LYMPH NODE.      - FIBROUS SCARRING / CHANGES SUGGESTIVE OF NEOADJUVANT THERAPY ARE NOT      IDENTIFIED.       4. Breast, excision, left lateral margin :      - UNREMARKABLE BREAST TISSUE.      - NEGATIVE FOR ATYPIA AND MALIGNANCY.  The findings in the left simple mastectomy (specimen 1) are       consistent with a complete pathologic response to neoadjuvant therapy.Based on       these  findings and those in the left axillary sentinel lymph node excision       (specimen 3) the AJCC staging is ypT0 ypN0 (sn)     08/22/2023 -  Chemotherapy   Patient is on Treatment Plan : BREAST Pembrolizumab  (200) q21d x 27 weeks       Interval history- Penny Gomez is a 38 year old female with above history of breast cancer, currently on maintenance keytruda , last on 01/23/24, who presents to symptom management clinic for complaints of post-prandial diarrhea x 3 weeks. She otherwise feels well- no fevers, cramping, heartburn, nausea. Denies pain. No new supplements or medications. Doesn't seem to be associated with any particular foods. Denies history of chronic or recurring diarrhea. Denies blood in her stool. Feels well hydrated and fluids don't cause symptoms. No weight loss. Denies recent travel, un-treated water . She did have a risk reducing TLH-BSO with Dr Verdon on 01/28/24 and received preoperative prophylactic antibiotics. She was seen by Sidra Mower, NP on 02/02/24 for symptoms. GI panel by  pcr was negative at that time. Fecal calprotectin was borderline at 67.   Review of systems- Review of Systems  Constitutional:  Negative for chills, fever, malaise/fatigue and weight loss.  HENT:  Negative for hearing loss, nosebleeds, sore throat and tinnitus.   Respiratory:  Negative for cough, hemoptysis, shortness of breath and wheezing.   Cardiovascular:  Negative for chest pain, palpitations and leg swelling.  Gastrointestinal:  Positive for diarrhea. Negative for abdominal pain, blood in stool, constipation, melena, nausea and vomiting.  Genitourinary:  Negative for dysuria and urgency.  Musculoskeletal:  Negative for back pain, falls, joint pain and myalgias.  Skin:  Negative for itching and rash.  Neurological:  Negative for dizziness, tingling, sensory change, loss of consciousness, weakness and headaches.  Endo/Heme/Allergies:  Negative for environmental allergies. Does not  bruise/bleed easily.  Psychiatric/Behavioral:  Negative for depression. The patient is not nervous/anxious and does not have insomnia.     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   Vascular port complication    Past Surgical History:  Procedure Laterality Date   BREAST BIOPSY Right 05/20/2019   Affirm Bx- X- clip, neg   BREAST BIOPSY Left 10/02/2022   US  Bx, path pending   BREAST BIOPSY Left 10/02/2022   Us  Bx Node- path pending   BREAST BIOPSY Left 10/02/2022   US  LT BREAST BX W LOC DEV 1ST LESION IMG BX SPEC US  GUIDE 10/02/2022 [facility]   BREAST RECONSTRUCTION Bilateral 07/24/2023   Procedure: RECONSTRUCTION, BREAST;  Surgeon: Lowery Estefana RAMAN, DO;  Location: [facility];  Service: Plastics;  Laterality: Bilateral;  BILATERAL IMMEDIATE BREAST RECONSTRUCTION WITH EXPANDER AND FLEX HD PLACEMENT   CESAREAN SECTION     x 2   CYSTOSCOPY N/A 01/28/2024   Procedure: CYSTOSCOPY;  Surgeon: Verdon Keen, MD;  Location: [facility];  Service: Gynecology;  Laterality: N/A;   IR CV LINE INJECTION  01/27/2024   LAPAROSCOPIC BILATERAL SALPINGECTOMY Bilateral 02/09/2022   Procedure: LAPAROSCOPIC BILATERAL SALPINGECTOMY;  Surgeon: Connell Davies, MD;  Location: [facility];  Service: Gynecology;  Laterality: Bilateral;   MASTECTOMY W/ SENTINEL NODE BIOPSY Bilateral 07/24/2023   Procedure: MASTECTOMY WITH SENTINEL LYMPH NODE BIOPSY;  Surgeon: Tye Millet, DO;  Location: [facility];  Service: General;  Laterality: Bilateral;  SN biopsy left only   PORTACATH PLACEMENT Right  10/17/2022   Procedure: INSERTION PORT-A-CATH;  Surgeon: Desiderio Schanz, MD;  Location: [facility];  Service: General;  Laterality: Right;   PORTACATH PLACEMENT Left 02/10/2023   Procedure: INSERTION PORT-A-CATH;  Surgeon: Tye Millet, DO;  Location: [facility];  Service: General;  Laterality: Left;   REMOVAL OF BILATERAL TISSUE EXPANDERS WITH PLACEMENT OF BILATERAL BREAST IMPLANTS Bilateral 09/11/2023   Procedure: REMOVAL, TISSUE EXPANDER, BREAST, BILATERAL, WITH BILATERAL IMPLANT IMPLANT INSERTION;  Surgeon: Lowery Estefana RAMAN, DO;  Location: [facility];  Service: Plastics;  Laterality: Bilateral;  right expander removal and replacement.   ROBOTIC ASSISTED LAPAROSCOPIC LYSIS OF ADHESION N/A  01/28/2024   Procedure: LYSIS, ADHESIONS, ROBOT-ASSISTED, LAPAROSCOPIC;  Surgeon: Verdon Keen, MD;  Location: [facility];  Service: Gynecology;  Laterality: N/A;   ROBOTIC ASSISTED TOTAL HYSTERECTOMY WITH BILATERAL SALPINGO OOPHERECTOMY Bilateral 01/28/2024   Procedure: HYSTERECTOMY, TOTAL, ROBOT-ASSISTED, LAPAROSCOPIC, WITH BILATERAL SALPINGO-OOPHORECTOMY;  Surgeon: Verdon Keen, MD;  Location: [facility];  Service: Gynecology;  Laterality: Bilateral;  Vaginal Morcellation, Ureteral Lysis, and Modified McCall's   TEE WITHOUT CARDIOVERSION N/A 01/02/2023   Procedure: TRANSESOPHAGEAL ECHOCARDIOGRAM (TEE);  Surgeon: Perla Evalene PARAS, MD;  Location: [facility];  Service: Cardiovascular;  Laterality: N/A;   TUBAL LIGATION     Social History   Socioeconomic History   Marital status: Single    Spouse name: Not on file   Number of children: 2   Years of education: Not on file   Highest education level: High school graduate  Occupational History   Not on file  Tobacco Use   Smoking status: Never    Passive exposure: Never   Smokeless tobacco: Never  Vaping Use   Vaping status: Never Used  Substance and Sexual Activity   Alcohol use: Never   Drug use: No   Sexual activity: Yes    Birth  control/protection: Surgical  Other Topics Concern   Not on file  Social History Narrative   Lives with mom and 2 kids   Social Drivers of Health   Tobacco Use: Low Risk (02/09/2024)   Patient History    Smoking Tobacco Use: Never    Smokeless Tobacco Use: Never    Passive Exposure: Never  Financial Resource Strain: Low Risk  (11/21/2023)   Received from [facility]   Overall Financial Resource Strain (CARDIA)    Difficulty of Paying Living Expenses: Not hard at all  Food Insecurity: No Food Insecurity (11/21/2023)   Received from [facility]   Epic    Within the past 12 months, you worried that your food would run out before you got the money to buy more.: Never true    Within the past 12 months, the food you bought just didn't last and you didn't have money to get more.: Never true  Transportation Needs: No Transportation Needs (11/21/2023)   Received from [facility] - [facility]    In the past 12 months, has lack of transportation kept you from medical appointments or from getting medications?: No    Lack of Transportation (Non-Medical): No  Physical Activity: Not on file  Stress: No Stress Concern Present (10/10/2022)   [employer] - Occupational Stress Questionnaire    Feeling of Stress : Only a little  Social Connections: Not on file  Intimate Partner Violence: Not At Risk (12/31/2022)   Humiliation, Afraid, Rape, and Kick questionnaire    Fear of Current or Ex-Partner: No    Emotionally Abused: No    Physically Abused: No    Sexually Abused: No  Depression (PHQ2-9): Low Risk (02/09/2024)   Depression (PHQ2-9)    PHQ-2 Score: 0  Alcohol Screen: Not on file  Housing: Low Risk  (11/21/2023)   Received from [facility]   Epic    In the last 12 months, was there a time when you were not able to pay the mortgage or rent on time?: No    In the past 12  months, how many times have you moved where you were living?: 0    At any time in the past 12 months,  were you homeless or living in a shelter (including now)?: No  Utilities: Not At Risk (11/21/2023)   Received from [facility]   Epic    In the past 12 months has the electric, gas, oil, or water  company threatened to shut off services in your home?: No  Health Literacy: Adequate Health Literacy (10/10/2022)   B1300 Health Literacy    Frequency of need for help with medical instructions: Never    Family History  Problem Relation Age of Onset   Breast cancer Mother 17       no GT   Breast cancer Maternal Aunt 33   Breast cancer Maternal Aunt    Lung cancer Maternal Uncle    Lung cancer Maternal Uncle    Bone cancer Paternal Aunt    Pancreatic cancer Maternal Grandmother 35   Lung cancer Maternal Grandfather 52   Breast cancer Cousin 40   Breast cancer Other 33       gene pos?    Current Medications[2]  Physical exam:  Vitals:   02/09/24 0859  BP: 127/75  Pulse: 73  Resp: 16  Temp: 97.8 F (36.6 C)  TempSrc: Tympanic  SpO2: 100%  Weight: 134 lb (60.8 kg)   Physical Exam Vitals reviewed.  Constitutional:      Appearance: She is not ill-appearing.  HENT:     Mouth/Throat:     Mouth: Mucous membranes are moist.  Cardiovascular:     Rate and Rhythm: Normal rate and regular rhythm.  Pulmonary:     Effort: No respiratory distress.  Abdominal:     General: There is no distension.     Tenderness: There is no abdominal tenderness. There is no rebound.  Skin:    Coloration: Skin is not jaundiced or pale.  Neurological:     Mental Status: She is alert and oriented to person, place, and time.  Psychiatric:        Mood and Affect: Mood normal.        Behavior: Behavior normal.        Latest Ref Rng & Units 02/09/2024    9:18 AM  CMP  Glucose 70 - 99 mg/dL 63   BUN 6 - 20 mg/dL 7   Creatinine 9.55 - 8.99 mg/dL 9.42   Sodium 864 - 854 mmol/L 141    Potassium 3.5 - 5.1 mmol/L 3.6   Chloride 98 - 111 mmol/L 105   CO2 22 - 32 mmol/L 24   Calcium  8.9 - 10.3 mg/dL 9.4   Total Protein 6.5 - 8.1 g/dL 7.4   Total Bilirubin 0.0 - 1.2 mg/dL <9.7   Alkaline Phos 38 - 126 U/L 108   AST 15 - 41 U/L 31   ALT 0 - 44 U/L 32       Latest Ref Rng & Units 02/09/2024    9:18 AM  CBC  WBC 4.0 - 10.5 K/uL 5.9   Hemoglobin 12.0 - 15.0 g/dL 88.2   Hematocrit 63.9 - 46.0 % 36.8   Platelets 150 - 400 K/uL 241    IR CV Line Injection Result Date: 01/27/2024 CLINICAL DATA:  History of breast carcinoma and status post prior left-sided Port-A-Cath placement on 02/10/2023. The port demonstrates malfunctioning with poor blood return after access. EXAM: CONTRAST INJECTION OF PORT A CATH UNDER FLUOROSCOPY CONTRAST:  5 mL Omnipaque  300 FLUOROSCOPY: Radiation exposure index: 21 mGy Kerma PROCEDURE: Contrast was administered via the indwelling port after it was accessed. Fluoroscopic spot images were  obtained of the catheter during injection. FINDINGS: Initial fluoroscopy demonstrates a tunneled Port-A-Cath on the left with jugular access. The tunneled portion of the catheter shows some redundancy in the subcutaneous to soft tissues. The catheter tip lies in a transverse orientation in distal aspect of the left brachiocephalic vein just before the origin of the SVC. Contrast injection demonstrates no evidence of contrast extravasation or catheter rupture. There is fibrin sheath material at the tip of the catheter with contrast emanating predominantly along the superior aspect the catheter tip. IMPRESSION: Transversely oriented catheter tip of the Port-A-Cath in the distal left brachiocephalic vein. Fibrin sheath material also at the tip of the catheter. If there continues to be malfunctioning of the Port-A-Cath, catheter revision will likely be necessary. Electronically Signed   By: Marcey Moan M.D.   On: 01/27/2024 16:53    Assessment and plan- Patient is a 38 y.o.  female undergoing treatment for triple negative breast cancer who presents to symptom management clinic for complaints of:   Post-prandial diarrhea- GI panel was negative. Fecal calprotectin was low borderline. Well appearing and symptoms aren't worsening. Recent antibiotics and hospitalization/surgery on 01/28/24. IO related diarrhea vs colitis vs infectious vs others. Will send c diff today. If negative, plan to treat with course of prednisone 20 mg daily x 5 days for possible IO induced diarrhea. Can also trial Imodium vs lomotil and consider GI referral. Labs today reviewed and appears well hdyrated without evidence of weight loss, hypotension, of electrolyte disarrangements. Hold off on IV fluids.  History of Breast Cancer- s/p 4 cycles of neoadjuvant chemo with carbo-taxol -keytruda  followed by 4 cycles of AC-Keytruda  and bilateral mastectomy then adjuvant RT. She is now s/p cycle 5 of maintenance keytruda . She will follow up with Dr Babara as scheduled.  Port- a-cath: not able to draw blood. Previously no response to cath-flow. Underwent dye study which showed fibrin sheath. Discussed port revision or fibrin sheath stripping vs removal w/o replacement. She will consider options and contact clinic with decision.   Disposition:  Follow up as scheduled- la   Visit Diagnosis 1. Diarrhea, unspecified type   2. Encounter for care related to Port-a-Cath    Patient expressed understanding and was in agreement with this plan. She also understands that She can call clinic at any time with any questions, concerns, or complaints.   Thank you for allowing me to participate in the care of this very pleasant patient.   Tinnie Dawn, DNP, AGNP-C, AOCNP [facility] [phone]    [1] No Known Allergies [2]  Current Outpatient Medications:    Calcium -Phosphorus-Vitamin D (CALCIUM  GUMMIES PO), Take 2 each by mouth in the morning., Disp: , Rfl:    oxyCODONE  (OXY IR/ROXICODONE ) 5 MG  immediate release tablet, Take 1 tablet (5 mg total) by mouth every 4 (four) hours as needed for severe pain (pain score 7-10)., Disp: 15 tablet, Rfl: 0   senna (SENOKOT) 8.6 MG TABS tablet, Take 2 tablets (17.2 mg total) by mouth daily. (Patient not taking: Reported on 02/02/2024), Disp: 120 tablet, Rfl: 2  "

## 2024-02-09 NOTE — Telephone Encounter (Signed)
 Pt scheduled for Fibrin sheath stripping on Wed 12/31 @ 12p, arrive 11a. H &V entrance, NPO after midnight and pt will need a driver. Pt aware of appt details.

## 2024-02-10 ENCOUNTER — Other Ambulatory Visit (HOSPITAL_COMMUNITY): Payer: Self-pay | Admitting: Radiology

## 2024-02-10 ENCOUNTER — Telehealth: Payer: Self-pay

## 2024-02-10 DIAGNOSIS — C50919 Malignant neoplasm of unspecified site of unspecified female breast: Secondary | ICD-10-CM

## 2024-02-10 NOTE — Telephone Encounter (Signed)
 Patient for IR Port Fibrin Sheath removal on Wed 02/11/24, I called and spoke with the patient on the phone and gave pre-procedure instructions. Pt was made aware to be here at 11a, NPO after MN prior to procedure as well as driver post procedure/recovery/discharge. Pt stated understanding.  Called 02/10/24

## 2024-02-10 NOTE — Progress Notes (Signed)
 "   Chief Complaint:  Port-a-catheter malfunction  Procedure: Port-a-catheter revision w/ removal of current port and replacement with new port-a-catheter  Referring Provider(s): Dr. JENEANE Cap  Supervising Physician: Jenna Hacker  Patient Status: [facility] - Out-pt  History of Present Illness: Penny Gomez is a 38 y.o. female with a history of triple negative left breast cancer s/p neoadjuvant chemotherapy, bilateral mastectomy, and total hysterectomy with bilateral salpingo-oophorectomy currently on maintenance therapy with keytruda . She has a left-sided port-a-catheter placed by Dr. Tye on 02/10/23 that was previously injected by Dr. KANDICE Moan on 01/27/24 due to poor blood return after access. At that time, fibrin sheath material was noted at the tip of the catheter, with a recommendation for possible revision should the port continue to malfunction. Unfortunately, she has continued to have poor blood return and has been referred back to IR for fibrin sheath stripping. After reviewing previous patient imaging, it was determined that the best course of action would be removal of the current left sided port and replacement with a new port. Dr. Jenna discussed the various options as well as risks/benefits of each with the patient at the bedside and she is agreeable to removing her current port and placing a new one on either the right or the left.   Patient is currently resting in bed in no acute distress. She denies any recent fevers/chills, nausea/vomiting, abdominal pain, chest pain, or shortness of breath. NPO since midnight. All questions and concerns answered at the bedside.   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    Microcytic anemia    MSSA bacteremia    Multifocal pneumonia     Sepsis (HCC)    due to port a cath   STD (sexually transmitted disease)    From Medical Hx;   Thrombocytopenia    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   Vascular port complication     Past Surgical History:  Procedure Laterality Date   BREAST BIOPSY Right 05/20/2019   Affirm Bx- X- clip, neg   BREAST BIOPSY Left 10/02/2022   US  Bx, path pending   BREAST BIOPSY Left 10/02/2022   Us  Bx Node- path pending   BREAST BIOPSY Left 10/02/2022   US  LT BREAST BX W LOC DEV 1ST LESION IMG BX SPEC US  GUIDE 10/02/2022 [facility]   BREAST RECONSTRUCTION Bilateral 07/24/2023   Procedure: RECONSTRUCTION, BREAST;  Surgeon: Lowery Estefana RAMAN, DO;  Location: [facility] ORS;  Service: Plastics;  Laterality: Bilateral;  BILATERAL IMMEDIATE BREAST RECONSTRUCTION WITH EXPANDER AND FLEX HD PLACEMENT   CESAREAN SECTION     x 2   CYSTOSCOPY N/A 01/28/2024   Procedure: CYSTOSCOPY;  Surgeon: Verdon Keen, MD;  Location: [facility] OR[facility];  Service: Gynecology;  Laterality: N/A;   IR CV LINE INJECTION  01/27/2024   LAPAROSCOPIC BILATERAL SALPINGECTOMY Bilateral 02/09/2022   Procedure: LAPAROSCOPIC BILATERAL SALPINGECTOMY;  Surgeon: Connell Davies, MD;  Location: [facility] ORS;  Service: Gynecology;  Laterality: Bilateral;   MASTECTOMY W/ SENTINEL NODE BIOPSY Bilateral 07/24/2023   Procedure: MASTECTOMY WITH SENTINEL LYMPH NODE BIOPSY;  Surgeon: Tye Millet, DO;  Location: [facility] OR[facility];  Service: General;  Laterality: Bilateral;  SN biopsy left only   PORTACATH PLACEMENT Right 10/17/2022   Procedure: INSERTION PORT-A-CATH;  Surgeon: Desiderio Schanz, MD;  Location: [facility];  Service: General;  Laterality: Right;   PORTACATH PLACEMENT Left 02/10/2023   Procedure: INSERTION PORT-A-CATH;  Surgeon: Tye Millet, DO;  Location: [facility];  Service: General;  Laterality: Left;   REMOVAL OF BILATERAL TISSUE EXPANDERS WITH PLACEMENT OF BILATERAL BREAST IMPLANTS Bilateral 09/11/2023   Procedure: REMOVAL, TISSUE EXPANDER, BREAST,  BILATERAL, WITH BILATERAL IMPLANT IMPLANT INSERTION;  Surgeon: Lowery Estefana RAMAN, DO;  Location: [facility];  Service: Plastics;  Laterality: Bilateral;  right expander removal and replacement.   ROBOTIC ASSISTED LAPAROSCOPIC LYSIS OF ADHESION N/A 01/28/2024   Procedure: LYSIS, ADHESIONS, ROBOT-ASSISTED, LAPAROSCOPIC;  Surgeon: Verdon Keen, MD;  Location: [facility];  Service: Gynecology;  Laterality: N/A;   ROBOTIC ASSISTED TOTAL HYSTERECTOMY WITH BILATERAL SALPINGO OOPHERECTOMY Bilateral 01/28/2024   Procedure: HYSTERECTOMY, TOTAL, ROBOT-ASSISTED, LAPAROSCOPIC, WITH BILATERAL SALPINGO-OOPHORECTOMY;  Surgeon: Verdon Keen, MD;  Location: [facility] OR[facility];  Service: Gynecology;  Laterality: Bilateral;  Vaginal Morcellation, Ureteral Lysis, and Modified McCall's   TEE WITHOUT CARDIOVERSION N/A 01/02/2023   Procedure: TRANSESOPHAGEAL ECHOCARDIOGRAM (TEE);  Surgeon: Perla Evalene PARAS, MD;  Location: [facility];  Service: Cardiovascular;  Laterality: N/A;   TUBAL LIGATION      Allergies: Patient has no allergy information on record.  Medications: Prior to Admission medications  Medication Sig Start Date End Date Taking? Authorizing Provider  Calcium -Phosphorus-Vitamin D (CALCIUM  GUMMIES PO) Take 2 each by mouth in the morning.    [provider]  oxyCODONE  (OXY IR/ROXICODONE ) 5 MG immediate release tablet Take 1 tablet (5 mg total) by mouth every 4 (four) hours as needed for severe pain (pain score 7-10). 01/28/24   Verdon Keen, MD  senna (SENOKOT) 8.6 MG TABS tablet Take 2 tablets (17.2 mg total) by mouth daily. Patient not taking: Reported on 02/02/2024 11/15/22   Babara Call, MD     Family History  Problem Relation Age of Onset   Breast cancer Mother 68       no GT   Breast cancer Maternal Aunt 33   Breast cancer Maternal Aunt    Lung cancer Maternal Uncle    Lung cancer Maternal Uncle    Bone cancer Paternal Aunt    Pancreatic cancer Maternal Grandmother 81    Lung cancer Maternal Grandfather 78   Breast cancer Cousin 40   Breast cancer Other 33       gene pos?    Social History   Socioeconomic History   Marital status: Single    Spouse name: Not on file   Number of children: 2   Years of education: Not on file   Highest education level: High school graduate  Occupational History   Not on file  Tobacco Use   Smoking status: Never    Passive exposure: Never   Smokeless tobacco: Never  Vaping Use   Vaping status: Never Used  Substance and Sexual Activity   Alcohol use: Never   Drug use: No   Sexual activity: Yes    Birth control/protection: Surgical  Other Topics Concern   Not on file  Social History Narrative   Lives with mom and 2 kids   Social Drivers of Health   Tobacco Use: Low Risk (02/11/2024)   Patient History    Smoking Tobacco Use: Never    Smokeless Tobacco Use: Never    Passive Exposure: Never  Financial Resource Strain: Low Risk  (11/21/2023)   Received from [facility]   Overall Financial Resource Strain (CARDIA)  Difficulty of Paying Living Expenses: Not hard at all  Food Insecurity: No Food Insecurity (11/21/2023)   Received from [facility]   Epic    Within the past 12 months, you worried that your food would run out before you got the money to buy more.: Never true    Within the past 12 months, the food you bought just didn't last and you didn't have money to get more.: Never true  Transportation Needs: No Transportation Needs (11/21/2023)   Received from [facility] - Transportation    In the past 12 months, has lack of transportation kept you from medical appointments or from getting medications?: No    Lack of Transportation (Non-Medical): No  Physical Activity: Not on file  Stress: No Stress Concern Present (10/10/2022)   [employer] - Occupational Stress Questionnaire    Feeling of Stress : Only a little   Social Connections: Not on file  Depression (PHQ2-9): Low Risk (02/09/2024)   Depression (PHQ2-9)    PHQ-2 Score: 0  Alcohol Screen: Not on file  Housing: Low Risk  (11/21/2023)   Received from [facility]   Epic    In the last 12 months, was there a time when you were not able to pay the mortgage or rent on time?: No    In the past 12 months, how many times have you moved where you were living?: 0    At any time in the past 12 months, were you homeless or living in a shelter (including now)?: No  Utilities: Not At Risk (11/21/2023)   Received from [facility]   Epic    In the past 12 months has the electric, gas, oil, or water  company threatened to shut off services in your home?: No  Health Literacy: Adequate Health Literacy (10/10/2022)   B1300 Health Literacy    Frequency of need for help with medical instructions: Never    Review of Systems Patient denies any headache, chest pain, shortness of breath, abdominal pain, N/V, or fever/chills. All other systems are negative.   Vital Signs: BP 100/71   Pulse (!) 55   Temp 98.1 F (36.7 C) (Temporal)   Resp 16   Ht 5' 2 (1.575 m)   Wt 135 lb (61.2 kg)   LMP 01/01/2024 (Approximate)   SpO2 100%   BMI 24.69 kg/m    Physical Exam Vitals reviewed.  Constitutional:      Appearance: Normal appearance.  HENT:     Mouth/Throat:     Mouth: Mucous membranes are moist.     Pharynx: Oropharynx is clear.  Cardiovascular:     Rate and Rhythm: Normal rate and regular rhythm.     Heart sounds: Normal heart sounds.     Comments: Left sided port palpable. Scar from previous right sided port visible on upper right chest wall. No tenderness to palpation of either area Pulmonary:     Effort: Pulmonary effort is normal.     Breath sounds: Normal breath sounds.  Abdominal:     General: Abdomen is flat.     Palpations: Abdomen is soft.     Tenderness: There is no abdominal tenderness.  Skin:     General: Skin is warm and dry.  Neurological:     Mental Status: She is alert and oriented to person, place, and time.  Psychiatric:        Behavior: Behavior normal.  Imaging: IR CV Line Injection Result Date: 01/27/2024 CLINICAL DATA:  History of breast carcinoma and status post prior left-sided Port-A-Cath placement on 02/10/2023. The port demonstrates malfunctioning with poor blood return after access. EXAM: CONTRAST INJECTION OF PORT A CATH UNDER FLUOROSCOPY CONTRAST:  5 mL Omnipaque  300 FLUOROSCOPY: Radiation exposure index: 21 mGy Kerma PROCEDURE: Contrast was administered via the indwelling port after it was accessed. Fluoroscopic spot images were obtained of the catheter during injection. FINDINGS: Initial fluoroscopy demonstrates a tunneled Port-A-Cath on the left with jugular access. The tunneled portion of the catheter shows some redundancy in the subcutaneous to soft tissues. The catheter tip lies in a transverse orientation in distal aspect of the left brachiocephalic vein just before the origin of the SVC. Contrast injection demonstrates no evidence of contrast extravasation or catheter rupture. There is fibrin sheath material at the tip of the catheter with contrast emanating predominantly along the superior aspect the catheter tip. IMPRESSION: Transversely oriented catheter tip of the Port-A-Cath in the distal left brachiocephalic vein. Fibrin sheath material also at the tip of the catheter. If there continues to be malfunctioning of the Port-A-Cath, catheter revision will likely be necessary. Electronically Signed   By: Marcey Moan M.D.   On: 01/27/2024 16:53    Labs:  CBC: Recent Labs    01/02/24 0812 01/23/24 0853 02/02/24 1120 02/09/24 0918  WBC 4.3 3.9* 6.3 5.9  HGB 10.2* 10.3* 12.1 11.7*  HCT 32.6* 33.0* 38.6 36.8  PLT 202 222 208 241    COAGS: No results for input(s): INR, APTT in the last 8760 hours.  BMP: Recent Labs    01/02/24 0812  01/23/24 0853 02/02/24 1120 02/09/24 0918  NA 137 139 138 141  K 3.6 4.1 4.5 3.6  CL 107 106 103 105  CO2 22 24 26 24   GLUCOSE 79 83 98 63*  BUN 9 10 7 7   CALCIUM  8.7* 9.1 9.6 9.4  CREATININE 0.63 0.63 0.59 0.57  GFRNONAA >60 >60 >60 >60    LIVER FUNCTION TESTS: Recent Labs    01/02/24 0812 01/23/24 0853 02/02/24 1120 02/09/24 0918  BILITOT 0.5 0.4 0.3 <0.2  AST 26 26 20 31   ALT 21 35 20 32  ALKPHOS 83 107 114 108  PROT 7.0 7.0 7.7 7.4  ALBUMIN 3.7 4.0 4.2 4.1    TUMOR MARKERS: No results for input(s): AFPTM, CEA, CA199, CHROMGRNA in the last 8760 hours.  Assessment and Plan:  Triple Negative Breast Cancer on maintenance therapy w/ malfunctioning port: Penny Gomez is a 38 y.o. female with a history of triple negative breast cancer s/p adjuvant chemotherapy, bilateral mastectomy, and total hysterectomy with bilateral salpingo-oophorectomy currently on maintenance therapy with Keytruda . She was previously seen by Dr. Moan in IR on 12/16 for a port-injection due to poor blood return. At that time she was noted to have a fibrin sheath at the catheter tip and was recommended to have a port-revision if she continued to have issues with access. She presents to [facility] Interventional Radiology department for an image-guided port-a-catheter revision with likely removal of current port and replacement with new port with Dr. KANDICE Banner. Procedure to be performed under moderate sedation.  Risks and benefits of image guided port-a-catheter revision was discussed with the patient including, but not limited to bleeding, infection, pneumothorax, or fibrin sheath development and need for additional procedures.  All of the patient's questions were answered, patient is agreeable to proceed. Consent signed and in chart.   Thank you  for allowing our service to participate in CORLISS COGGESHALL 's care.    Electronically Signed: Marinell Igarashi M Roni Scow, PA-C   02/11/2024, 11:56  AM     I spent a total of 30 Minutes in face to face in clinical consultation, greater than 50% of which was counseling/coordinating care for port-a-catheter revision.  "

## 2024-02-11 ENCOUNTER — Encounter: Payer: Self-pay | Admitting: Radiology

## 2024-02-11 ENCOUNTER — Other Ambulatory Visit: Payer: Self-pay | Admitting: Oncology

## 2024-02-11 ENCOUNTER — Ambulatory Visit
Admission: RE | Admit: 2024-02-11 | Discharge: 2024-02-11 | Disposition: A | Discharge status: Home / Self Care | Source: Ambulatory Visit | Attending: Oncology | Admitting: Oncology

## 2024-02-11 ENCOUNTER — Other Ambulatory Visit: Payer: Self-pay

## 2024-02-11 DIAGNOSIS — Z95828 Presence of other vascular implants and grafts: Secondary | ICD-10-CM

## 2024-02-11 DIAGNOSIS — C50912 Malignant neoplasm of unspecified site of left female breast: Secondary | ICD-10-CM | POA: Insufficient documentation

## 2024-02-11 DIAGNOSIS — Z90722 Acquired absence of ovaries, bilateral: Secondary | ICD-10-CM | POA: Diagnosis not present

## 2024-02-11 DIAGNOSIS — C50919 Malignant neoplasm of unspecified site of unspecified female breast: Secondary | ICD-10-CM

## 2024-02-11 DIAGNOSIS — Z9013 Acquired absence of bilateral breasts and nipples: Secondary | ICD-10-CM | POA: Insufficient documentation

## 2024-02-11 DIAGNOSIS — Z90710 Acquired absence of both cervix and uterus: Secondary | ICD-10-CM | POA: Diagnosis not present

## 2024-02-11 DIAGNOSIS — T82594A Other mechanical complication of infusion catheter, initial encounter: Secondary | ICD-10-CM | POA: Diagnosis not present

## 2024-02-11 HISTORY — PX: IR REMOVAL TUN ACCESS W/ PORT W/O FL MOD SED: IMG2290

## 2024-02-11 HISTORY — PX: IR IMAGING GUIDED PORT INSERTION: IMG5740

## 2024-02-11 MED ORDER — MIDAZOLAM HCL 5 MG/5ML IJ SOLN
INTRAMUSCULAR | Status: AC | PRN
  Administered 2024-02-11: .5 mg via INTRAVENOUS
  Administered 2024-02-11: 1 mg via INTRAVENOUS
  Administered 2024-02-11: .5 mg via INTRAVENOUS

## 2024-02-11 MED ORDER — HEPARIN SOD (PORK) LOCK FLUSH 100 UNIT/ML IV SOLN
500.0000 [IU] | Freq: Once | INTRAVENOUS | Status: AC
  Administered 2024-02-11: 500 [IU] via INTRAVENOUS

## 2024-02-11 MED ORDER — ONDANSETRON 4 MG PO TBDP
4.0000 mg | ORAL_TABLET | Freq: Once | ORAL | Status: AC
  Administered 2024-02-11: 4 mg via ORAL

## 2024-02-11 MED ORDER — MIDAZOLAM HCL 2 MG/2ML IJ SOLN
INTRAMUSCULAR | Status: AC
  Filled 2024-02-11: qty 2

## 2024-02-11 MED ORDER — LIDOCAINE HCL 1 % IJ SOLN
30.0000 mL | Freq: Once | INTRAMUSCULAR | Status: AC
  Administered 2024-02-11: 25 mL via INTRADERMAL

## 2024-02-11 MED ORDER — CEFAZOLIN SODIUM-DEXTROSE 2-4 GM/100ML-% IV SOLN
INTRAVENOUS | Status: AC
  Filled 2024-02-11: qty 100

## 2024-02-11 MED ORDER — ONDANSETRON 4 MG PO TBDP
ORAL_TABLET | ORAL | Status: AC
  Filled 2024-02-11: qty 2

## 2024-02-11 MED ORDER — CEFAZOLIN SODIUM-DEXTROSE 2-4 GM/100ML-% IV SOLN
INTRAVENOUS | Status: AC | PRN
  Administered 2024-02-11: 2 g via INTRAVENOUS

## 2024-02-11 MED ORDER — FENTANYL CITRATE (PF) 100 MCG/2ML IJ SOLN
INTRAMUSCULAR | Status: AC
  Filled 2024-02-11: qty 2

## 2024-02-11 MED ORDER — FENTANYL CITRATE (PF) 100 MCG/2ML IJ SOLN
INTRAMUSCULAR | Status: AC | PRN
  Administered 2024-02-11: 50 ug via INTRAVENOUS
  Administered 2024-02-11 (×2): 25 ug via INTRAVENOUS

## 2024-02-11 MED ORDER — LIDOCAINE HCL 1 % IJ SOLN
INTRAMUSCULAR | Status: AC
  Filled 2024-02-11: qty 40

## 2024-02-11 MED ORDER — HEPARIN SOD (PORK) LOCK FLUSH 100 UNIT/ML IV SOLN
INTRAVENOUS | Status: AC
  Filled 2024-02-11: qty 5

## 2024-02-11 MED ORDER — SODIUM CHLORIDE 0.9 % IV SOLN: INTRAVENOUS | Status: DC

## 2024-02-11 NOTE — Discharge Instructions (Signed)
 Implanted [facility] Guide  An implanted port is a type of central line that is placed under the skin. Central lines are used to provide IV access when treatment or nutrition needs to be given through a persons veins. Implanted ports are used for long-term IV access. An implanted port may be placed because: You need IV medicine that would be irritating to the small veins in your hands or arms. You need long-term IV medicines, such as antibiotics. You need IV nutrition for a long period. You need frequent blood draws for lab tests. You need dialysis.   Implanted ports are usually placed in the chest area, but they can also be placed in the upper arm, the abdomen, or the leg. An implanted port has two main parts: Reservoir. The reservoir is round and will appear as a small, raised area under your skin. The reservoir is the part where a needle is inserted to give medicines or draw blood. Catheter. The catheter is a thin, flexible tube that extends from the reservoir. The catheter is placed into a large vein. Medicine that is inserted into the reservoir goes into the catheter and then into the vein.   How will I care for my incision  You may shower today no rubbing over site Please remove dressing in 24hrs not other skin care is needed  How is my port accessed? Special steps must be taken to access the port: Before the port is accessed, a numbing cream can be placed on the skin. This helps numb the skin over the port site. Your health care provider uses a sterile technique to access the port. Your health care provider must put on a mask and sterile gloves. The skin over your port is cleaned carefully with an antiseptic and allowed to dry. The port is gently pinched between sterile gloves, and a needle is inserted into the port. Only non-coring port needles should be used to access the port. Once the port is accessed, a blood return should be checked. This helps ensure that the port is in the  vein and is not clogged. If your port needs to remain accessed for a constant infusion, a clear (transparent) bandage will be placed over the needle site. The bandage and needle will need to be changed every week, or as directed by your health care provider.   What is flushing? Flushing helps keep the port from getting clogged. Follow your health care providers instructions on how and when to flush the port. Ports are usually flushed with saline solution or a medicine called heparin . The need for flushing will depend on how the port is used. If the port is used for intermittent medicines or blood draws, the port will need to be flushed: After medicines have been given. After blood has been drawn. As part of routine maintenance. If a constant infusion is running, the port may not need to be flushed.   How long will my port stay implanted? The port can stay in for as long as your health care provider thinks it is needed. When it is time for the port to come out, surgery will be done to remove it. The procedure is similar to the one performed when the port was put in. When should I seek immediate medical care? When you have an implanted port, you should seek immediate medical care if: You notice a bad smell coming from the incision site. You have swelling, redness, or drainage at the incision site. You have more swelling  or pain at the port site or the surrounding area. You have a fever that is not controlled with medicine.   This information is not intended to replace advice given to you by your health care provider. Make sure you discuss any questions you have with your health care provider. Document Released: 01/28/2005 Document Revised: 07/06/2015 Document Reviewed: 10/05/2012 Elsevier Interactive Patient Education  2017 [employer].

## 2024-02-11 NOTE — Progress Notes (Signed)
 Patient clinically stable post IR Port removal/Port placement on right, per Dr Jenna rear well. Vitals stable post procedure. Received Versed  2 mg along with Fentanyl  100 mcg IV for procedure. Repot given to Jerel Cashing RN post procedure/specials./16

## 2024-02-11 NOTE — Progress Notes (Signed)
 Patient complaining of slight nausea. Orders received for 4mg  zofran  ODT and given to patient. Patient observed by RN. Patient states improvement and ready to discharge to home.

## 2024-02-11 NOTE — Procedures (Signed)
 Interventional Radiology Procedure Note  Procedure: Chest Port placement and removal   Complications: None  Estimated Blood Loss: < 10 mL  Findings: right jugular vein access for placement of a SL chest port.  Catheter tip at cavoatrial junction.  Left port removed.  Cordella DELENA Banner, MD

## 2024-02-13 ENCOUNTER — Ambulatory Visit

## 2024-02-13 ENCOUNTER — Other Ambulatory Visit

## 2024-02-13 ENCOUNTER — Ambulatory Visit: Admitting: Oncology

## 2024-02-13 ENCOUNTER — Inpatient Hospital Stay

## 2024-02-13 ENCOUNTER — Inpatient Hospital Stay: Admitting: Nurse Practitioner

## 2024-02-20 ENCOUNTER — Inpatient Hospital Stay

## 2024-02-20 ENCOUNTER — Encounter: Payer: Self-pay | Admitting: Oncology

## 2024-02-20 ENCOUNTER — Other Ambulatory Visit: Payer: Self-pay

## 2024-02-20 ENCOUNTER — Inpatient Hospital Stay: Attending: Oncology

## 2024-02-20 ENCOUNTER — Inpatient Hospital Stay: Attending: Oncology | Admitting: Oncology

## 2024-02-20 VITALS — BP 108/81 | HR 62 | Temp 97.4°F | Resp 16

## 2024-02-20 VITALS — BP 117/53 | HR 64 | Temp 97.4°F | Ht 62.0 in | Wt 134.0 lb

## 2024-02-20 DIAGNOSIS — R197 Diarrhea, unspecified: Secondary | ICD-10-CM | POA: Diagnosis not present

## 2024-02-20 DIAGNOSIS — C50919 Malignant neoplasm of unspecified site of unspecified female breast: Secondary | ICD-10-CM

## 2024-02-20 DIAGNOSIS — Z1502 Genetic susceptibility to malignant neoplasm of ovary: Secondary | ICD-10-CM | POA: Insufficient documentation

## 2024-02-20 DIAGNOSIS — Z9013 Acquired absence of bilateral breasts and nipples: Secondary | ICD-10-CM | POA: Diagnosis not present

## 2024-02-20 DIAGNOSIS — Z86711 Personal history of pulmonary embolism: Secondary | ICD-10-CM | POA: Diagnosis not present

## 2024-02-20 DIAGNOSIS — Z7901 Long term (current) use of anticoagulants: Secondary | ICD-10-CM | POA: Diagnosis not present

## 2024-02-20 DIAGNOSIS — Z800 Family history of malignant neoplasm of digestive organs: Secondary | ICD-10-CM | POA: Diagnosis not present

## 2024-02-20 DIAGNOSIS — Z5112 Encounter for antineoplastic immunotherapy: Secondary | ICD-10-CM

## 2024-02-20 DIAGNOSIS — N951 Menopausal and female climacteric states: Secondary | ICD-10-CM | POA: Diagnosis not present

## 2024-02-20 DIAGNOSIS — Z801 Family history of malignant neoplasm of trachea, bronchus and lung: Secondary | ICD-10-CM | POA: Diagnosis not present

## 2024-02-20 DIAGNOSIS — Z803 Family history of malignant neoplasm of breast: Secondary | ICD-10-CM | POA: Insufficient documentation

## 2024-02-20 DIAGNOSIS — Z23 Encounter for immunization: Secondary | ICD-10-CM | POA: Diagnosis not present

## 2024-02-20 DIAGNOSIS — Z1722 Progesterone receptor negative status: Secondary | ICD-10-CM | POA: Insufficient documentation

## 2024-02-20 DIAGNOSIS — D509 Iron deficiency anemia, unspecified: Secondary | ICD-10-CM | POA: Diagnosis not present

## 2024-02-20 DIAGNOSIS — Z1505 Genetic susceptibility to malignant neoplasm of fallopian tube(s): Secondary | ICD-10-CM | POA: Diagnosis not present

## 2024-02-20 DIAGNOSIS — D500 Iron deficiency anemia secondary to blood loss (chronic): Secondary | ICD-10-CM

## 2024-02-20 DIAGNOSIS — C50312 Malignant neoplasm of lower-inner quadrant of left female breast: Secondary | ICD-10-CM | POA: Diagnosis present

## 2024-02-20 DIAGNOSIS — Z15060 Genetic susceptibility to colorectal cancer: Secondary | ICD-10-CM

## 2024-02-20 DIAGNOSIS — Z15068 Genetic susceptibility to other malignant neoplasm of digestive system: Secondary | ICD-10-CM

## 2024-02-20 DIAGNOSIS — Z1732 Human epidermal growth factor receptor 2 negative status: Secondary | ICD-10-CM | POA: Diagnosis not present

## 2024-02-20 DIAGNOSIS — E876 Hypokalemia: Secondary | ICD-10-CM

## 2024-02-20 DIAGNOSIS — Z1589 Genetic susceptibility to other disease: Secondary | ICD-10-CM

## 2024-02-20 DIAGNOSIS — Z171 Estrogen receptor negative status [ER-]: Secondary | ICD-10-CM | POA: Diagnosis not present

## 2024-02-20 DIAGNOSIS — Z1501 Genetic susceptibility to malignant neoplasm of breast: Secondary | ICD-10-CM | POA: Diagnosis not present

## 2024-02-20 DIAGNOSIS — K921 Melena: Secondary | ICD-10-CM | POA: Diagnosis not present

## 2024-02-20 DIAGNOSIS — Z1509 Genetic susceptibility to other malignant neoplasm: Secondary | ICD-10-CM

## 2024-02-20 LAB — CMP (CANCER CENTER ONLY)
ALT: 36 U/L (ref 0–44)
AST: 28 U/L (ref 15–41)
Albumin: 4 g/dL (ref 3.5–5.0)
Alkaline Phosphatase: 113 U/L (ref 38–126)
Anion gap: 12 (ref 5–15)
BUN: 9 mg/dL (ref 6–20)
CO2: 22 mmol/L (ref 22–32)
Calcium: 9.4 mg/dL (ref 8.9–10.3)
Chloride: 107 mmol/L (ref 98–111)
Creatinine: 0.69 mg/dL (ref 0.44–1.00)
GFR, Estimated: >60 mL/min
Glucose, Bld: 167 mg/dL — ABNORMAL HIGH (ref 70–99)
Potassium: 3.4 mmol/L — ABNORMAL LOW (ref 3.5–5.1)
Sodium: 141 mmol/L (ref 135–145)
Total Bilirubin: 0.2 mg/dL (ref 0.0–1.2)
Total Protein: 7.1 g/dL (ref 6.5–8.1)

## 2024-02-20 LAB — CBC WITH DIFFERENTIAL (CANCER CENTER ONLY)
Abs Immature Granulocytes: 0.02 K/uL (ref 0.00–0.07)
Basophils Absolute: 0 K/uL (ref 0.0–0.1)
Basophils Relative: 0 %
Eosinophils Absolute: 0.1 K/uL (ref 0.0–0.5)
Eosinophils Relative: 3 %
HCT: 36.1 % (ref 36.0–46.0)
Hemoglobin: 11.2 g/dL — ABNORMAL LOW (ref 12.0–15.0)
Immature Granulocytes: 0 %
Lymphocytes Relative: 29 %
Lymphs Abs: 1.4 K/uL (ref 0.7–4.0)
MCH: 24.9 pg — ABNORMAL LOW (ref 26.0–34.0)
MCHC: 31 g/dL (ref 30.0–36.0)
MCV: 80.2 fL (ref 80.0–100.0)
Monocytes Absolute: 0.2 K/uL (ref 0.1–1.0)
Monocytes Relative: 5 %
Neutro Abs: 3 K/uL (ref 1.7–7.7)
Neutrophils Relative %: 63 %
Platelet Count: 192 K/uL (ref 150–400)
RBC: 4.5 MIL/uL (ref 3.87–5.11)
RDW: 18.7 % — ABNORMAL HIGH (ref 11.5–15.5)
WBC Count: 4.7 K/uL (ref 4.0–10.5)
nRBC: 0 % (ref 0.0–0.2)

## 2024-02-20 LAB — IRON AND TIBC
Iron: 45 ug/dL (ref 28–170)
Saturation Ratios: 15 % (ref 10.4–31.8)
TIBC: 300 ug/dL (ref 250–450)
UIBC: 255 ug/dL

## 2024-02-20 LAB — FERRITIN: Ferritin: 112 ng/mL (ref 11–307)

## 2024-02-20 MED ORDER — POTASSIUM CHLORIDE CRYS ER 20 MEQ PO TBCR: 20.0000 meq | EXTENDED_RELEASE_TABLET | Freq: Every day | ORAL | 0 refills | Status: AC

## 2024-02-20 MED ORDER — SODIUM CHLORIDE 0.9 % IV SOLN
INTRAVENOUS | Status: DC
  Filled 2024-02-20: qty 250

## 2024-02-20 MED ORDER — SODIUM CHLORIDE 0.9 % IV SOLN
200.0000 mg | Freq: Once | INTRAVENOUS | Status: AC
  Administered 2024-02-20: 200 mg via INTRAVENOUS
  Filled 2024-02-20: qty 8

## 2024-02-20 MED ORDER — IRON SUCROSE 20 MG/ML IV SOLN
200.0000 mg | Freq: Once | INTRAVENOUS | Status: AC
  Administered 2024-02-20: 200 mg via INTRAVENOUS
  Filled 2024-02-20: qty 10

## 2024-02-20 NOTE — Assessment & Plan Note (Addendum)
 Resolved now.  ? Immunotherapy induced. Close monitor to see if symptoms recurr after this cycle of immunotherapy.   Blood in stool,refer to GI

## 2024-02-20 NOTE — Assessment & Plan Note (Signed)
 Once she finished treatment of breast cancer,s/p risk-reducing salpingo oophorectomy in December 2025 pancreatic screening at age 39 (or 24 years younger than the earliest exocrine pancreatic cancer diagnosis in the family, whichever is earlier)  Family members are encouraged to consider genetic testing for this familial pathogenic variant.

## 2024-02-20 NOTE — Progress Notes (Signed)
 " Hematology/Oncology Progress note [phone] Fax:([phone]       CHIEF COMPLAINTS/PURPOSE OF CONSULTATION:  Left triple negative breast cancer.   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  S/p neoadjuvant chemotherapy with Carboplatin , taxol  Keytruda  Q3 weeks x 4 cycle  followed by [facility] + Keytrua with D4 GCSF x 4 cycles.  S/p bilateral mastectomy. no residual disease. ypT0 ypN0 (sn)  --> reconstruction in progress. S/p adjuvant RT  Patient has BRCA 1 mutation, no residual disease, does not qualify Olarparib eligibility.   Recommend adjuvant Keytrya every 3 weeks, plan for 9 cycles.  Labs are reviewed and discussed with patient. Proceed with cycle 6 Keytruda .    IDA (iron  deficiency anemia) Labs are reviewed and discussed with patient. Lab Results  Component Value Date   HGB 11.2 (L) 02/20/2024   TIBC 417 12/12/2023   IRONPCTSAT 6 (L) 12/12/2023   FERRITIN 5 (L) 12/12/2023   Hb improved after 4 Venofer  treatments and decreased again after her recent surgery.  Differential diagnosis: blood loss from recent surgery, blood loss from GI tract, immunotherapy induced colitis.   Will repeat iron  panel today.  Will give her one more dose of Venofer  200mg  today  BRCA1 gene mutation positive Once she finished treatment of breast cancer,s/p risk-reducing salpingo oophorectomy in December 2025 pancreatic screening at age 39 (or 10 years younger than the earliest exocrine pancreatic cancer diagnosis in the family, whichever is earlier)  Family members are encouraged to consider genetic testing for this familial pathogenic variant.   Encounter for antineoplastic immunotherapy Immunotherapy plan as  listed above  Hypocalcemia Recommend OTC calcium  1200mg  and Vitamin D 1000 units supplementation  Diarrhea Resolved now.  ? Immunotherapy induced. Close monitor to see if symptoms recurr after this cycle of immunotherapy.   Blood in stool,refer to GI  Hypokalemia Recommend potassium chloride  20meq daily x 3   Orders Placed This Encounter  Procedures   Iron  and TIBC    Standing Status:   Future    Number of Occurrences:   1    Expected Date:   02/20/2024    Expiration Date:   05/20/2024   Ferritin    Standing Status:   Future    Number of Occurrences:   1    Expected Date:   02/20/2024    Expiration Date:   05/20/2024   Ambulatory referral to Gastroenterology    Referral Priority:   Routine    Referral Type:   Consultation    Referral Reason:   Specialty Services Required    Number of Visits Requested:   1   Follow up 3 weeks.  All questions were answered. The patient knows to call the clinic with any problems, questions or concerns.  Call Babara, MD, PhD [facility] Hematology Oncology 02/20/2024    HISTORY OF PRESENTING ILLNESS:  Penny Gomez 39 y.o. female presents to establish care for left triple negative breast cancer I have reviewed her chart and materials related to her cancer extensively and collaborated history with the patient. Summary of oncologic history is as follows: Oncology History  Invasive carcinoma of breast (HCC)  07/24/2022 Mammogram   She noticed left breast mass for 1 month.  Bilateral diagnostic  mammogram showed Suspicious palpable left breast mass 8 o'clock position. Cortically thickened left axillary lymph node   10/10/2022 Initial Diagnosis   Invasive carcinoma of breast (HCC)  10/02/2022  Left breast mass biopsy and left axillary lymph node biopsy.  Diagnosis 1. Breast, left, needle core biopsy, 8 o'clock 6 cmfn, heart clip - INVASIVE MAMMARY CARCINOMA, NO SPECIAL TYPE. - TUBULE FORMATION: SCORE 3 - NUCLEAR PLEOMORPHISM: SCORE 3 - MITOTIC  COUNT: SCORE 3 - TOTAL SCORE: 9 - OVERALL GRADE: 3 - LYMPHOVASCULAR INVASION: NOT IDENTIFIED - CANCER LENGTH: 11 MM - CALCIFICATIONS: NOT IDENTIFIED - DUCTAL CARCINOMA IN SITU: PRESENT, HIGH-GRADE - ER-, PR- HER2 - [IHC 1+] 2. Lymph node, needle/core biopsy, left axillary, hydromark - LYMPH NODE WITH REACTIVE CHANGES; NEGATIVE FOR MALIGNANCY.  Menarche at age of 58 First live birth at age of 88 OCP use: no History of hysterectomy: no Menopausal status: premenopausal History of HRT use: no History of chest radiation: no Number of previous breast biopsies:  right breast biopsy 05/20/2019 negative for malignancy Strong family history of cancer mother breast cancer diagnosed in 43s, maternal aunts x 2 breast cancer, maternal cousins breast cancer x 2, maternal grandmother pancreatic cancer.    10/10/2022 Cancer Staging   Staging form: Breast, AJCC 8th Edition - Clinical stage from 10/10/2022: Stage IIIB (cT3, cN0, cM0, G3, ER-, PR-, HER2-) - Signed by Babara Call, MD on 10/18/2022 Stage prefix: Initial diagnosis Nuclear grade: G3 Histologic grading system: 3 grade system   10/17/2022 Imaging   Bilateral MRI breasts w wo contrast  1. In total, there is approximately 6.7 cm of abnormal enhancement, predominantly in the lower-inner inferior left breast, with the ultrasound-guided biopsy marking clip centrally positioned within the enhancement. There are scattered small enhancing masses in the lower inner and lower outer anterior left (included in the measurement above).   2.  No evidence of right breast malignancy.   3. The axillary lymph nodes are not included within the field of view of this MRI for adequate assessment of lymphadenopathy.    10/17/2022 Procedure   S/p medi port placement.    10/25/2022 - 10/25/2022 Chemotherapy   Patient is on Treatment Plan : BREAST ADJUVANT DOSE DENSE AC q14d / PACLitaxel  q7d     10/25/2022 - 05/19/2023 Chemotherapy   Patient is on Treatment Plan : BREAST  Pembrolizumab  (200) D1 + Carboplatin  (5) D1 + Paclitaxel  (80) D1,8,15 q21d X 4 cycles / Pembrolizumab  (200) D1 + AC D1 q21d x 4 cycles      Genetic Testing   Single pathogenic variant in BRCA1 called c.190T>G identified on the [facility] Multi-Cancer+RNA panel. The remainder of testing was normal. The report date is 11/11/2022.  The Multi-Cancer + RNA Panel offered by [facility] includes sequencing and/or deletion/duplication analysis of the following 70 genes:  AIP*, ALK, APC*, ATM*, AXIN2*, BAP1*, BARD1*, BLM*, BMPR1A*, BRCA1*, BRCA2*, BRIP1*, CDC73*, CDH1*, CDK4, CDKN1B*, CDKN2A, CHEK2*, CTNNA1*, DICER1*, EPCAM, EGFR, FH*, FLCN*, GREM1, HOXB13, KIT, LZTR1, MAX*, MBD4, MEN1*, MET, MITF, MLH1*, MSH2*, MSH3*, MSH6*, MUTYH*, NF1*, NF2*, NTHL1*, PALB2*, PDGFRA, PMS2*, POLD1*, POLE*, POT1*, PRKAR1A*, PTCH1*, PTEN*, RAD51C*, RAD51D*, RB1*, RET, SDHA*, SDHAF2*, SDHB*, SDHC*, SDHD*, SMAD4*, SMARCA4*, SMARCB1*, SMARCE1*, STK11*, SUFU*, TMEM127*, TP53*, TSC1*, TSC2*, VHL*. RNA analysis is performed for * genes.   11/24/2022 - 11/25/2022 Hospital Admission   Admitted due to acute pulmonary embolism, lower extremity US  negative for DVT.  Patietn was started on heparin  gtt and transitioned to Eliquis  at discharge.    02/10/2023 Procedure   S/p port  placement.    03/10/2023 Imaging   MRI bilateral breast w wo contrast  No abnormal enhancement in the left breast consistent with response to neoadjuvant chemotherapy.   07/24/2023 Surgery   Patient underwent bilateral mastectomy 1. Breast, simple mastectomy, left, long stitch lateral, short stitch superior :      - CHANGES CONSISTENT WITH NEOADJUVANT THERAPY, NEGATIVE FOR RESIDUAL MALIGNANCY.      - SEE NOTE.      - SCLEROSING ADENOSIS AND SECRETORY CHANGE.      - BIOPSY SITE CHANGE WITH CLIP.      - UNREMARKABLE NIPPLE AND SKIN.       2. Breast, simple mastectomy, right, long stitch lateral, short stitch superior :      - SCLEROSING ADENOSIS, FIBROUS STROMA,  AND FIBROADENOMATOID CHANGE.      - UNREMARKABLE NIPPLE AND SKIN.      - NEGATIVE FOR ATYPIA AND MALIGNANCY.       3. Lymph node, sentinel, biopsy, left axillary #1 :      - TWO LYMPH NODES NEGATIVE FOR MALIGNANCY (0/2).      - SEE NOTE.      - BIOPSY SITE CHANGE INVOLVING ONE LYMPH NODE.      - FIBROUS SCARRING / CHANGES SUGGESTIVE OF NEOADJUVANT THERAPY ARE NOT      IDENTIFIED.       4. Breast, excision, left lateral margin :      - UNREMARKABLE BREAST TISSUE.      - NEGATIVE FOR ATYPIA AND MALIGNANCY.  The findings in the left simple mastectomy (specimen 1) are       consistent with a complete pathologic response to neoadjuvant therapy.Based on       these findings and those in the left axillary sentinel lymph node excision       (specimen 3) the AJCC staging is ypT0 ypN0 (sn)     08/22/2023 -  Chemotherapy   Patient is on Treatment Plan : BREAST Pembrolizumab  (200) q21d x 27 weeks      S/p bilateral mastectomy, ongoing plastic surgery  for reconstruction. S/p post mastectomy radiation.  S/p TSH BSO in December 2025  Today she presents for evaluation prior to immunotherapy.  She reports diarrhea which usually happened after meals. She was seen by [facility] during the interval. GI stool panel was negative. C diff was tested as her symptoms soon resolved.   Today she denies SOB, abdominal pain, diarrhea.  Occasional she notices blood in the stool.    MEDICAL HISTORY:  Past Medical History:  Diagnosis Date   Acute pulmonary embolism (HCC)    BRCA1 gene mutation positive    Breast lump    Chronic anticoagulation    Endocarditis    Fibroid, uterine    Immunosuppressed due to chemotherapy    Microcytic anemia    MSSA bacteremia    Multifocal pneumonia    Sepsis (HCC)    due to port a cath   STD (sexually transmitted disease)    From Medical Hx;   Thrombocytopenia    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   Vascular port complication     SURGICAL HISTORY: Past  Surgical History:  Procedure Laterality Date   BREAST BIOPSY Right 05/20/2019   Affirm Bx- X- clip, neg   BREAST BIOPSY Left 10/02/2022   US  Bx, path pending   BREAST BIOPSY Left 10/02/2022   Us  Bx Node- path pending   BREAST BIOPSY Left 10/02/2022   US  LT  BREAST BX W LOC DEV 1ST LESION IMG BX SPEC US  GUIDE 10/02/2022 [facility]   BREAST RECONSTRUCTION Bilateral 07/24/2023   Procedure: RECONSTRUCTION, BREAST;  Surgeon: Lowery Estefana RAMAN, DO;  Location: [facility];  Service: Plastics;  Laterality: Bilateral;  BILATERAL IMMEDIATE BREAST RECONSTRUCTION WITH EXPANDER AND FLEX HD PLACEMENT   CESAREAN SECTION     x 2   CYSTOSCOPY N/A 01/28/2024   Procedure: CYSTOSCOPY;  Surgeon: Verdon Keen, MD;  Location: [facility];  Service: Gynecology;  Laterality: N/A;   IR CV LINE INJECTION  01/27/2024   IR IMAGING GUIDED PORT INSERTION  02/11/2024   IR REMOVAL TUN ACCESS W/ PORT W/O FL MOD SED  02/11/2024   LAPAROSCOPIC BILATERAL SALPINGECTOMY Bilateral 02/09/2022   Procedure: LAPAROSCOPIC BILATERAL SALPINGECTOMY;  Surgeon: Connell Davies, MD;  Location: [facility];  Service: Gynecology;  Laterality: Bilateral;   MASTECTOMY W/ SENTINEL NODE BIOPSY Bilateral 07/24/2023   Procedure: MASTECTOMY WITH SENTINEL LYMPH NODE BIOPSY;  Surgeon: Tye Millet, DO;  Location: [facility];  Service: General;  Laterality: Bilateral;  SN biopsy left only   PORTACATH PLACEMENT Right 10/17/2022   Procedure: INSERTION PORT-A-CATH;  Surgeon: Desiderio Schanz, MD;  Location: [facility];  Service: General;  Laterality: Right;   PORTACATH PLACEMENT Left 02/10/2023   Procedure: INSERTION PORT-A-CATH;  Surgeon: Tye Millet, DO;  Location: [facility];  Service: General;  Laterality: Left;   REMOVAL OF BILATERAL TISSUE EXPANDERS WITH PLACEMENT OF BILATERAL BREAST IMPLANTS Bilateral 09/11/2023   Procedure: REMOVAL, TISSUE EXPANDER, BREAST, BILATERAL, WITH BILATERAL IMPLANT IMPLANT INSERTION;  Surgeon: Lowery Estefana RAMAN, DO;  Location:  [facility];  Service: Plastics;  Laterality: Bilateral;  right expander removal and replacement.   ROBOTIC ASSISTED LAPAROSCOPIC LYSIS OF ADHESION N/A 01/28/2024   Procedure: LYSIS, ADHESIONS, ROBOT-ASSISTED, LAPAROSCOPIC;  Surgeon: Verdon Keen, MD;  Location: [facility];  Service: Gynecology;  Laterality: N/A;   ROBOTIC ASSISTED TOTAL HYSTERECTOMY WITH BILATERAL SALPINGO OOPHERECTOMY Bilateral 01/28/2024   Procedure: HYSTERECTOMY, TOTAL, ROBOT-ASSISTED, LAPAROSCOPIC, WITH BILATERAL SALPINGO-OOPHORECTOMY;  Surgeon: Verdon Keen, MD;  Location: [facility];  Service: Gynecology;  Laterality: Bilateral;  Vaginal Morcellation, Ureteral Lysis, and Modified McCall's   TEE WITHOUT CARDIOVERSION N/A 01/02/2023   Procedure: TRANSESOPHAGEAL ECHOCARDIOGRAM (TEE);  Surgeon: Perla Evalene PARAS, MD;  Location: [facility];  Service: Cardiovascular;  Laterality: N/A;   TUBAL LIGATION      SOCIAL HISTORY: Social History   Socioeconomic History   Marital status: Single    Spouse name: Not on file   Number of children: 2   Years of education: Not on file   Highest education level: High school graduate  Occupational History   Not on file  Tobacco Use   Smoking status: Never    Passive exposure: Never   Smokeless tobacco: Never  Vaping Use   Vaping status: Never Used  Substance and Sexual Activity   Alcohol use: Never   Drug use: No   Sexual activity: Yes    Birth control/protection: Surgical  Other Topics Concern   Not on file  Social History Narrative   Lives with mom and 2 kids   Social Drivers of Health   Tobacco Use: Low Risk (02/20/2024)   Patient History    Smoking Tobacco Use: Never    Smokeless Tobacco Use: Never    Passive Exposure: Never  Financial Resource Strain: Low Risk  (11/21/2023)   Received from [facility]   Overall Financial Resource Strain (CARDIA)    Difficulty of Paying Living Expenses: Not hard at  all  Food Insecurity: No Food  Insecurity (11/21/2023)   Received from [facility]   Epic    Within the past 12 months, you worried that your food would run out before you got the money to buy more.: Never true    Within the past 12 months, the food you bought just didn't last and you didn't have money to get more.: Never true  Transportation Needs: No Transportation Needs (11/21/2023)   Received from [facility] - Transportation    In the past 12 months, has lack of transportation kept you from medical appointments or from getting medications?: No    Lack of Transportation (Non-Medical): No  Physical Activity: Not on file  Stress: No Stress Concern Present (10/10/2022)   [employer] - Occupational Stress Questionnaire    Feeling of Stress : Only a little  Social Connections: Not on file  Intimate Partner Violence: Not At Risk (12/31/2022)   Humiliation, Afraid, Rape, and Kick questionnaire    Fear of Current or Ex-Partner: No    Emotionally Abused: No    Physically Abused: No    Sexually Abused: No  Depression (PHQ2-9): Low Risk (02/20/2024)   Depression (PHQ2-9)    PHQ-2 Score: 0  Alcohol Screen: Not on file  Housing: Low Risk  (11/21/2023)   [facility] from [facility]   Epic    In the last 12 months, was there a time when you were not able to pay the mortgage or rent on time?: No    In the past 12 months, how many times have you moved where you were living?: 0    At any time in the past 12 months, were you homeless or living in a shelter (including now)?: No  Utilities: Not At Risk (11/21/2023)   Received from [facility]   Epic    In the past 12 months has the electric, gas, oil, or water  company threatened to shut off services in your home?: No  Health Literacy: Adequate Health Literacy (10/10/2022)   B1300 Health Literacy    Frequency of need for help with medical instructions: Never    FAMILY  HISTORY: Family History  Problem Relation Age of Onset   Breast cancer Mother 94       no GT   Breast cancer Maternal Aunt 33   Breast cancer Maternal Aunt    Lung cancer Maternal Uncle    Lung cancer Maternal Uncle    Bone cancer Paternal Aunt    Pancreatic cancer Maternal Grandmother 3   Lung cancer Maternal Grandfather 57   Breast cancer Cousin 40   Breast cancer Other 33       gene pos?    ALLERGIES:  has no allergies on file.  MEDICATIONS:  Current Outpatient Medications  Medication Sig Dispense Refill   Calcium -Phosphorus-Vitamin D (CALCIUM  GUMMIES PO) Take 2 each by mouth in the morning. (Patient not taking: Reported on 02/20/2024)     oxyCODONE  (OXY IR/ROXICODONE ) 5 MG immediate release tablet Take 1 tablet (5 mg total) by mouth every 4 (four) hours as needed for severe pain (pain score 7-10). (Patient not taking: Reported on 02/20/2024) 15 tablet 0   senna (SENOKOT) 8.6 MG TABS tablet Take 2 tablets (17.2 mg total) by mouth daily. (Patient not taking: Reported on 02/20/2024) 120 tablet 2   No current facility-administered medications for this visit.   Facility-Administered Medications Ordered in Other Visits  Medication Dose Route Frequency Provider Last Rate Last Admin   0.9 %  sodium chloride  infusion   Intravenous Continuous Babara Call, MD 10 mL/hr at 02/20/24 0900 New Bag at 02/20/24 0900   pembrolizumab  (KEYTRUDA ) 200 mg in sodium chloride  0.9 % 50 mL chemo infusion  200 mg Intravenous Once Babara Call, MD 116 mL/hr at 02/20/24 0927 200 mg at 02/20/24 9072    Review of Systems  Constitutional:  Negative for appetite change, chills, fatigue and fever.  HENT:   Negative for hearing loss and voice change.   Eyes:  Negative for eye problems.  Respiratory:  Negative for chest tightness and cough.   Cardiovascular:  Negative for chest pain.  Gastrointestinal:  Positive for blood in stool. Negative for abdominal distention and abdominal pain.  Endocrine: Negative for hot  flashes.  Genitourinary:  Negative for difficulty urinating and frequency.   Musculoskeletal:  Negative for arthralgias.  Skin:  Negative for itching and rash.  Neurological:  Negative for extremity weakness and headaches.  Hematological:  Negative for adenopathy.  Psychiatric/Behavioral:  Negative for confusion.      PHYSICAL EXAMINATION: ECOG PERFORMANCE STATUS: 0 - Asymptomatic  Vitals:   02/20/24 0825  BP: (!) 117/53  Pulse: 64  Temp: (!) 97.4 F (36.3 C)  SpO2: 100%   Filed Weights   02/20/24 0825  Weight: 134 lb (60.8 kg)    Physical Exam Constitutional:      General: She is not in acute distress.    Appearance: She is not diaphoretic.  HENT:     Head: Normocephalic and atraumatic.  Eyes:     General: No scleral icterus. Cardiovascular:     Rate and Rhythm: Normal rate and regular rhythm.  Pulmonary:     Effort: Pulmonary effort is normal. No respiratory distress.     Breath sounds: No wheezing.  Abdominal:     General: Bowel sounds are normal. There is no distension.     Palpations: Abdomen is soft.  Musculoskeletal:        General: Normal range of motion.     Cervical back: Normal range of motion and neck supple.  Skin:    General: Skin is warm and dry.     Findings: No erythema.     Comments: Left anterior chest wall medi port  Neurological:     Mental Status: She is alert and oriented to person, place, and time. Mental status is at baseline.     Motor: No abnormal muscle tone.  Psychiatric:        Mood and Affect: Mood and affect normal.     LABORATORY DATA:  I have reviewed the data as listed    Latest Ref Rng & Units 02/20/2024    8:10 AM 02/09/2024    9:18 AM 02/02/2024   11:20 AM  CBC  WBC 4.0 - 10.5 K/uL 4.7  5.9  6.3   Hemoglobin 12.0 - 15.0 g/dL 88.7  88.2  87.8   Hematocrit 36.0 - 46.0 % 36.1  36.8  38.6   Platelets 150 - 400 K/uL 192  241  208       Latest Ref Rng & Units 02/20/2024    8:10 AM 02/09/2024    9:18 AM 02/02/2024    11:20 AM  CMP  Glucose 70 - 99 mg/dL 832  63  98   BUN 6 - 20 mg/dL 9  7  7    Creatinine 0.44 - 1.00 mg/dL 9.30  9.42  9.40  Sodium 135 - 145 mmol/L 141  141  138   Potassium 3.5 - 5.1 mmol/L 3.4  3.6  4.5   Chloride 98 - 111 mmol/L 107  105  103   CO2 22 - 32 mmol/L 22  24  26    Calcium  8.9 - 10.3 mg/dL 9.4  9.4  9.6   Total Protein 6.5 - 8.1 g/dL 7.1  7.4  7.7   Total Bilirubin 0.0 - 1.2 mg/dL 0.2  <9.7  0.3   Alkaline Phos 38 - 126 U/L 113  108  114   AST 15 - 41 U/L 28  31  20    ALT 0 - 44 U/L 36  32  20      RADIOGRAPHIC STUDIES: I have personally reviewed the radiological images as listed and agreed with the findings in the report. IR IMAGING GUIDED PORT INSERTION Result Date: 02/11/2024 CLINICAL DATA:  Left-sided breast cancer. Malfunctioning left-sided chest port. Planned placement of a right-sided chest port for durable venous access and ongoing therapy. EXAM: Chest port catheter placement TECHNIQUE: Procedure performed using fluoroscopy and ultrasound CONTRAST:  None RADIOPHARMACEUTICALS:  None FLUOROSCOPY: 1.7  MGy COMPARISON:  None FINDINGS: The patient was placed in supine position on the IR gantry and the right upper chest and neck were prepped and draped in the usual sterile fashion. The nurse administered intravenous fentanyl  and Versed  under my supervision and the nurse had no other injuries other than monitoring the patient and administering medications. I was present for the entire duration of procedure. 2 mg intravenous Versed  and 100 mcg intravenous fentanyl  were administered for a total sedation time of 42 minutes. Ultrasound guidance was used to investigate the right internal jugular vein which was anechoic and compressible indicating patency. The needle was then advanced from a scan negative through the soft tissue into the right internal jugular vein under ultrasound guidance. A final image was obtained and stored in the patient's permanent medical record. Access was  then exchanged over a guidewire which was advanced under fluoroscopic guidance. The needle was removed and replaced with a micropuncture sheath. Approximately 2 inches below the clavicle the port pocket was created with a subsequent incision. The catheter was then tunneled from the port pocket to the venotomy site overlying the right internal jugular vein. Access was then exchanged over an 035 guidewire for peel-away sheath which was advanced over the guidewire under fluoroscopic guidance. The catheter was then advanced through the peel-away sheath to the sinoatrial junction. Sheath was removed. The catheter was then cut at the port pocket and connected to chest port. The chest port was tested for function and finally function well. The chest port was then flushed with heparin  and a port pocket was closed with 4-0 suture. Final image was obtained demonstrating satisfactory position of chest port. The final count of all materials was satisfactory. IMPRESSION: 1. Satisfactory placement of right internal jugular vein single-lumen chest port. Catheter tip at the cavoatrial junction. 2.  Okay to use and power inject chest port. Electronically Signed   By: Cordella Banner   On: 02/11/2024 13:55   IR REMOVAL TUN ACCESS W/ PORT W/O FL MOD SED Result Date: 02/11/2024 INDICATION: Malpositioned and partially retracted left-sided chest port. Chest port not functioning. Planned removal. EXAM: REMOVAL left IJ VEIN PORT-A-CATH MEDICATIONS: Ancef  2 g IV; The antibiotic was administered within an appropriate time interval prior to skin puncture. ANESTHESIA/SEDATION: Moderate (conscious) sedation was employed during this procedure. A total of Versed  2 mg and  Fentanyl  100 mcg was administered intravenously by the radiology nurse. Total intra-service moderate Sedation Time: 42 minutes. The patient's level of consciousness and vital signs were monitored continuously by radiology nursing throughout the procedure under my direct  supervision. COMPLICATIONS: None immediate. PROCEDURE: Informed written consent was obtained from the patient after a thorough discussion of the procedural risks, benefits and alternatives. All questions were addressed. Maximal Sterile Barrier Technique was utilized including caps, mask, sterile gowns, sterile gloves, sterile drape, hand hygiene and skin antiseptic. A timeout was performed prior to the initiation of the procedure. The left chest was prepped and draped in a sterile fashion. Lidocaine  was utilized for local anesthesia. An incision was made over the previously healed surgical incision. Utilizing blunt dissection, the port catheter and reservoir were removed from the underlying subcutaneous tissue in their entirety. Securing sutures were also removed. The pocket was irrigated with a copious amount of sterile normal saline. The pocket was closed with interrupted 3-0 Vicryl stitches. The subcutaneous tissue was closed with 3-0 Vicryl interrupted subcutaneous stitches. A 4-0 Vicryl running subcuticular stitch was utilized to approximate the skin. Dermabond was applied. IMPRESSION: Successful left IJ vein Port-A-Cath explant. Electronically Signed   By: Cordella Banner   On: 02/11/2024 13:48   IR CV Line Injection Result Date: 01/27/2024 CLINICAL DATA:  History of breast carcinoma and status post prior left-sided Port-A-Cath placement on 02/10/2023. The port demonstrates malfunctioning with poor blood return after access. EXAM: CONTRAST INJECTION OF PORT A CATH UNDER FLUOROSCOPY CONTRAST:  5 mL Omnipaque  300 FLUOROSCOPY: Radiation exposure index: 21 mGy Kerma PROCEDURE: Contrast was administered via the indwelling port after it was accessed. Fluoroscopic spot images were obtained of the catheter during injection. FINDINGS: Initial fluoroscopy demonstrates a tunneled Port-A-Cath on the left with jugular access. The tunneled portion of the catheter shows some redundancy in the subcutaneous to soft  tissues. The catheter tip lies in a transverse orientation in distal aspect of the left brachiocephalic vein just before the origin of the SVC. Contrast injection demonstrates no evidence of contrast extravasation or catheter rupture. There is fibrin sheath material at the tip of the catheter with contrast emanating predominantly along the superior aspect the catheter tip. IMPRESSION: Transversely oriented catheter tip of the Port-A-Cath in the distal left brachiocephalic vein. Fibrin sheath material also at the tip of the catheter. If there continues to be malfunctioning of the Port-A-Cath, catheter revision will likely be necessary. Electronically Signed   By: Marcey Moan M.D.   On: 01/27/2024 16:53      "

## 2024-02-20 NOTE — Assessment & Plan Note (Signed)
 Recommend OTC calcium  1200mg  and Vitamin D 1000 units supplementation

## 2024-02-20 NOTE — Assessment & Plan Note (Signed)
 Immunotherapy plan as listed above

## 2024-02-20 NOTE — Assessment & Plan Note (Addendum)
 Labs are reviewed and discussed with patient. Lab Results  Component Value Date   HGB 11.2 (L) 02/20/2024   TIBC 417 12/12/2023   IRONPCTSAT 6 (L) 12/12/2023   FERRITIN 5 (L) 12/12/2023   Hb improved after 4 Venofer  treatments and decreased again after her recent surgery.  Differential diagnosis: blood loss from recent surgery, blood loss from GI tract, immunotherapy induced colitis.   Will repeat iron  panel today.  Will give her one more dose of Venofer  200mg  today

## 2024-02-20 NOTE — Assessment & Plan Note (Signed)
 Recommend potassium chloride daily x 3

## 2024-02-20 NOTE — Assessment & Plan Note (Addendum)
 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  S/p neoadjuvant chemotherapy with Carboplatin , taxol  Keytruda  Q3 weeks x 4 cycle  followed by [facility] + Keytrua with D4 GCSF x 4 cycles.  S/p bilateral mastectomy. no residual disease. ypT0 ypN0 (sn)  --> reconstruction in progress. S/p adjuvant RT  Patient has BRCA 1 mutation, no residual disease, does not qualify Olarparib eligibility.   Recommend adjuvant Keytrya every 3 weeks, plan for 9 cycles.  Labs are reviewed and discussed with patient. Proceed with cycle 6 Keytruda .

## 2024-02-29 ENCOUNTER — Other Ambulatory Visit: Payer: Self-pay

## 2024-03-10 ENCOUNTER — Other Ambulatory Visit: Payer: Self-pay

## 2024-03-10 DIAGNOSIS — R197 Diarrhea, unspecified: Secondary | ICD-10-CM

## 2024-03-10 DIAGNOSIS — Z5112 Encounter for antineoplastic immunotherapy: Secondary | ICD-10-CM | POA: Diagnosis not present

## 2024-03-10 LAB — C DIFFICILE QUICK SCREEN W PCR REFLEX
C Diff antigen: NEGATIVE
C Diff interpretation: NOT DETECTED
C Diff toxin: NEGATIVE

## 2024-03-12 ENCOUNTER — Inpatient Hospital Stay

## 2024-03-12 ENCOUNTER — Encounter: Payer: Self-pay | Admitting: Oncology

## 2024-03-12 ENCOUNTER — Inpatient Hospital Stay: Admitting: Oncology

## 2024-03-12 VITALS — BP 118/82 | HR 61 | Temp 96.6°F | Resp 18 | Wt 134.9 lb

## 2024-03-12 DIAGNOSIS — Z5112 Encounter for antineoplastic immunotherapy: Secondary | ICD-10-CM | POA: Diagnosis not present

## 2024-03-12 DIAGNOSIS — R197 Diarrhea, unspecified: Secondary | ICD-10-CM | POA: Diagnosis not present

## 2024-03-12 DIAGNOSIS — C50919 Malignant neoplasm of unspecified site of unspecified female breast: Secondary | ICD-10-CM

## 2024-03-12 LAB — CMP (CANCER CENTER ONLY)
ALT: 28 U/L (ref 0–44)
AST: 27 U/L (ref 15–41)
Albumin: 4.1 g/dL (ref 3.5–5.0)
Alkaline Phosphatase: 121 U/L (ref 38–126)
Anion gap: 11 (ref 5–15)
BUN: 8 mg/dL (ref 6–20)
CO2: 23 mmol/L (ref 22–32)
Calcium: 9.3 mg/dL (ref 8.9–10.3)
Chloride: 107 mmol/L (ref 98–111)
Creatinine: 0.52 mg/dL (ref 0.44–1.00)
GFR, Estimated: >60 mL/min
Glucose, Bld: 125 mg/dL — ABNORMAL HIGH (ref 70–99)
Potassium: 3.5 mmol/L (ref 3.5–5.1)
Sodium: 141 mmol/L (ref 135–145)
Total Bilirubin: 0.3 mg/dL (ref 0.0–1.2)
Total Protein: 7 g/dL (ref 6.5–8.1)

## 2024-03-12 LAB — CBC WITH DIFFERENTIAL (CANCER CENTER ONLY)
Abs Immature Granulocytes: 0.02 10*3/uL (ref 0.00–0.07)
Basophils Absolute: 0 10*3/uL (ref 0.0–0.1)
Basophils Relative: 0 %
Eosinophils Absolute: 0.1 10*3/uL (ref 0.0–0.5)
Eosinophils Relative: 1 %
HCT: 37.5 % (ref 36.0–46.0)
Hemoglobin: 12 g/dL (ref 12.0–15.0)
Immature Granulocytes: 0 %
Lymphocytes Relative: 21 %
Lymphs Abs: 1.2 10*3/uL (ref 0.7–4.0)
MCH: 26.3 pg (ref 26.0–34.0)
MCHC: 32 g/dL (ref 30.0–36.0)
MCV: 82.2 fL (ref 80.0–100.0)
Monocytes Absolute: 0.3 10*3/uL (ref 0.1–1.0)
Monocytes Relative: 5 %
Neutro Abs: 4.4 10*3/uL (ref 1.7–7.7)
Neutrophils Relative %: 73 %
Platelet Count: 184 10*3/uL (ref 150–400)
RBC: 4.56 MIL/uL (ref 3.87–5.11)
RDW: 19.7 % — ABNORMAL HIGH (ref 11.5–15.5)
WBC Count: 6 10*3/uL (ref 4.0–10.5)
nRBC: 0 % (ref 0.0–0.2)

## 2024-03-12 MED ORDER — SODIUM CHLORIDE 0.9 % IV SOLN
INTRAVENOUS | Status: DC
  Filled 2024-03-12: qty 250

## 2024-03-12 MED ORDER — INFLUENZA VIRUS VACC SPLIT PF (FLUZONE) 0.5 ML IM SUSY
0.5000 mL | PREFILLED_SYRINGE | INTRAMUSCULAR | Status: AC
  Administered 2024-03-12: 0.5 mL via INTRAMUSCULAR
  Filled 2024-03-12: qty 0.5

## 2024-03-12 MED ORDER — SODIUM CHLORIDE 0.9 % IV SOLN
200.0000 mg | Freq: Once | INTRAVENOUS | Status: AC
  Administered 2024-03-12: 200 mg via INTRAVENOUS
  Filled 2024-03-12: qty 8

## 2024-03-12 NOTE — Assessment & Plan Note (Addendum)
 C diff negative.  possible Immunotherapy induced. Recommend PRN imodium.

## 2024-03-12 NOTE — Assessment & Plan Note (Signed)
 Immunotherapy plan as listed above

## 2024-03-12 NOTE — Patient Instructions (Addendum)
 [facility] - A DEPT OF [facility]  Discharge Instructions: Thank you for choosing [facility] to provide your oncology and hematology care.  If you have a lab appointment with the Cancer Center, please go directly to the Cancer Center and check in at the registration area.  Wear comfortable clothing and clothing appropriate for easy access to any Portacath or PICC line.   We strive to give you quality time with your provider. You may need to reschedule your appointment if you arrive late (15 or more minutes).  Arriving late affects you and other patients whose appointments are after yours.  Also, if you miss three or more appointments without notifying the office, you may be dismissed from the clinic at the providers discretion.      For prescription refill requests, have your pharmacy contact our office and allow 72 hours for refills to be completed.    Today you received the following chemotherapy and/or immunotherapy agents Keytruda       To help prevent nausea and vomiting after your treatment, we encourage you to take your nausea medication as directed.  BELOW ARE SYMPTOMS THAT SHOULD BE REPORTED IMMEDIATELY: *FEVER GREATER THAN 100.4 F (38 C) OR HIGHER *CHILLS OR SWEATING *NAUSEA AND VOMITING THAT IS NOT CONTROLLED WITH YOUR NAUSEA MEDICATION *UNUSUAL SHORTNESS OF BREATH *UNUSUAL BRUISING OR BLEEDING *URINARY PROBLEMS (pain or burning when urinating, or frequent urination) *BOWEL PROBLEMS (unusual diarrhea, constipation, pain near the anus) TENDERNESS IN MOUTH AND THROAT WITH OR WITHOUT PRESENCE OF ULCERS (sore throat, sores in mouth, or a toothache) UNUSUAL RASH, SWELLING OR PAIN  UNUSUAL VAGINAL DISCHARGE OR ITCHING   Items with * indicate a potential emergency and should be followed up as soon as possible or go to the Emergency Department if any problems should occur.  Please show the CHEMOTHERAPY ALERT CARD or IMMUNOTHERAPY  ALERT CARD at check-in to the Emergency Department and triage nurse.  Should you have questions after your visit or need to cancel or reschedule your appointment, please contact [facility] - A DEPT OF [facility]  [phone] and follow the prompts.  Office hours are 8:00 a.m. to 4:30 p.m. Monday - Friday. Please note that voicemails left after 4:00 p.m. may not be returned until the following business day.  We are closed weekends and major holidays. You have access to a nurse at all times for urgent questions. Please call the main number to the clinic [phone] and follow the prompts.  For any non-urgent questions, you may also contact your provider using MyChart. We now offer e-Visits for anyone 58 and older to request care online for non-urgent symptoms. For details visit [email or web address].   Also download the MyChart app! Go to the app store, search MyChart, open the app, select , and log in with your MyChart username and password.  Pembrolizumab  Injection What is this medication? PEMBROLIZUMAB  (PEM broe LIZ ue mab) treats some types of cancer. It works by helping your immune system slow or stop the spread of cancer cells. It is a monoclonal antibody. This medicine may be used for other purposes; ask your health care provider or pharmacist if you have questions. COMMON BRAND NAME(S): Keytruda  What should I tell my care team before I take this medication? They need to know if you have any of these conditions: Autoimmune conditions, such as Crohn disease, ulcerative colitis, lupus Have had radiation therapy to your  chest area Have received or plan to receive a stem cell transplant that uses donor stem cells (allogeneic) Nervous system problems, such as Guillain-Barre syndrome or myasthenia gravis Organ or tissue transplant An unusual or allergic reaction to pembrolizumab , other medications, foods, dyes, or preservatives Pregnant or  trying to get pregnant Breastfeeding How should I use this medication? This medication is injected into a vein. It is given by your care team in a hospital or clinic setting. A special MedGuide will be given to you before each treatment. Be sure to read this information carefully each time. Talk to your care team about the use of this medication in children. While it may be prescribed for children as young as 6 months for selected conditions, precautions do apply. Overdosage: If you think you have taken too much of this medicine contact a poison control center or emergency room at once. NOTE: This medicine is only for you. Do not share this medicine with others. What if I miss a dose? Keep appointments for follow-up doses. It is important not to miss your dose. Call your care team if you are unable to keep an appointment. What may interact with this medication? Interactions have not been studied. This list may not describe all possible interactions. Give your health care provider a list of all the medicines, herbs, non-prescription drugs, or dietary supplements you use. Also tell them if you smoke, drink alcohol, or use illegal drugs. Some items may interact with your medicine. What should I watch for while using this medication? Your condition will be monitored carefully while you are receiving this medication. You may need blood work while taking this medication. This medication may cause serious skin reactions. They can happen weeks to months after starting the medication. Contact your care team right away if you notice fevers or flu-like symptoms with a rash. The rash may be red or purple and then turn into blisters or peeling of the skin. You may also notice a red rash with swelling of the face, lips, or lymph nodes in your neck or under your arms. Tell your care team right away if you have any change in your eyesight. Talk to your care team if you may be pregnant. Serious birth defects can  occur if you take this medication during pregnancy and for 4 months after the last dose. You will need a negative pregnancy test before starting this medication. Contraception is recommended while taking this medication and for 4 months after the last dose. Your care team can help you find the option that works for you. Do not breastfeed while taking this medication and for 4 months after the last dose. What side effects may I notice from receiving this medication? Side effects that you should report to your care team as soon as possible: Allergic reactions--skin rash, itching, hives, swelling of the face, lips, tongue, or throat Dry cough, shortness of breath or trouble breathing Eye pain, redness, irritation, or discharge with blurry or decreased vision Heart muscle inflammation--unusual weakness or fatigue, shortness of breath, chest pain, fast or irregular heartbeat, dizziness, swelling of the ankles, feet, or hands Hormone gland problems--headache, sensitivity to light, unusual weakness or fatigue, dizziness, fast or irregular heartbeat, increased sensitivity to cold or heat, excessive sweating, constipation, hair loss, increased thirst or amount of urine, tremors or shaking, irritability Infusion reactions--chest pain, shortness of breath or trouble breathing, feeling faint or lightheaded Kidney injury (glomerulonephritis)--decrease in the amount of urine, red or dark brown urine, foamy or bubbly  urine, swelling of the ankles, hands, or feet Liver injury--right upper belly pain, loss of appetite, nausea, light-colored stool, dark yellow or brown urine, yellowing skin or eyes, unusual weakness or fatigue Pain, tingling, or numbness in the hands or feet, muscle weakness, change in vision, confusion or trouble speaking, loss of balance or coordination, trouble walking, seizures Rash, fever, and swollen lymph nodes Redness, blistering, peeling, or loosening of the skin, including inside the  mouth Sudden or severe stomach pain, bloody diarrhea, fever, nausea, vomiting Side effects that usually do not require medical attention (report to your care team if they continue or are bothersome): Bone, joint, or muscle pain Diarrhea Fatigue Loss of appetite Nausea Skin rash This list may not describe all possible side effects. Call your doctor for medical advice about side effects. You may report side effects to FDA at 1-800-FDA-1088. Where should I keep my medication? This medication is given in a hospital or clinic. It will not be stored at home. NOTE: This sheet is a summary. It may not cover all possible information. If you have questions about this medicine, talk to your doctor, pharmacist, or health care provider.  2025 Elsevier/Gold Standard (2023-12-01 00:00:00) Influenza Vaccine Injection What is this medication? INFLUENZA VACCINE (in floo EN zuh vak SEEN) reduces the risk of the influenza (flu). It does not treat influenza. It is still possible to get influenza after receiving this vaccine, but the symptoms may be less severe or not last as long. It works by helping your immune system learn how to fight off a future infection. This medicine may be used for other purposes; ask your health care provider or pharmacist if you have questions. COMMON BRAND NAME(S): Afluria Trivalent, FLUAD Trivalent, Fluarix Trivalent, Flublok Trivalent, FLUCELVAX Trivalent, Flulaval Trivalent, Fluzone  Trivalent What should I tell my care team before I take this medication? They need to know if you have any of these conditions: Bleeding disorder like hemophilia Fever or infection Guillain-Barre syndrome or other neurological problems Immune system problems Infection with the human immunodeficiency virus (HIV) or AIDS Low blood platelet counts Multiple sclerosis An unusual or allergic reaction to influenza virus vaccine, latex, other medications, foods, dyes, or preservatives. Different brands of  vaccines contain different allergens. Some may contain latex or eggs. Talk to your care team about your allergies to make sure that you get the right vaccine. Pregnant or trying to get pregnant Breastfeeding How should I use this medication? This vaccine is injected into a muscle or under the skin. It is given by your care team. A copy of Vaccine Information Statements will be given before each vaccination. Be sure to read this sheet carefully each time. This sheet may change often. Talk to your care team to see which vaccines are right for you. Some vaccines should not be used in all age groups. Overdosage: If you think you have taken too much of this medicine contact a poison control center or emergency room at once. NOTE: This medicine is only for you. Do not share this medicine with others. What if I miss a dose? This does not apply. What may interact with this medication? Certain medications that lower your immune system, such as etanercept, anakinra, infliximab, adalimumab Certain medications that prevent or treat blood clots, such as warfarin Chemotherapy or radiation therapy Phenytoin Steroid medications, such as prednisone or cortisone Theophylline Vaccines This list may not describe all possible interactions. Give your health care provider a list of all the medicines, herbs, non-prescription drugs, or dietary  supplements you use. Also tell them if you smoke, drink alcohol, or use illegal drugs. Some items may interact with your medicine. What should I watch for while using this medication? Report any side effects that do not go away with your care team. Call your care team if any unusual symptoms occur within 6 weeks of receiving this vaccine. You may still catch the flu, but the illness is not usually as bad. You cannot get the flu from the vaccine. The vaccine will not protect against colds or other illnesses that may cause fever. The vaccine is needed every year. What side effects  may I notice from receiving this medication? Side effects that you should report to your care team as soon as possible: Allergic reactions--skin rash, itching, hives, swelling of the face, lips, tongue, or throat Side effects that usually do not require medical attention (report these to your care team if they continue or are bothersome): Chills Fatigue Headache Joint pain Loss of appetite Muscle pain Nausea Pain, redness, or irritation at injection site This list may not describe all possible side effects. Call your doctor for medical advice about side effects. You may report side effects to FDA at 1-800-FDA-1088. Where should I keep my medication? The vaccine is only given by your care team. It will not be stored at home. NOTE: This sheet is a summary. It may not cover all possible information. If you have questions about this medicine, talk to your doctor, pharmacist, or health care provider.  2024 Elsevier/Gold Standard (2021-07-10 00:00:00)

## 2024-03-12 NOTE — Progress Notes (Signed)
 " Hematology/Oncology Progress note [phone] [phone]       CHIEF COMPLAINTS/PURPOSE OF CONSULTATION:  Left triple negative breast cancer.   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  S/p neoadjuvant chemotherapy with Carboplatin , taxol  Keytruda  Q3 weeks x 4 cycle  followed by [facility] + Keytrua with D4 GCSF x 4 cycles.  S/p bilateral mastectomy. no residual disease. ypT0 ypN0 (sn)  --> reconstruction in progress. S/p adjuvant RT  Patient has BRCA 1 mutation, no residual disease, does not qualify Olarparib eligibility.   Recommend adjuvant Keytrya every 3 weeks, plan for 9 cycles.  Labs are reviewed and discussed with patient. Proceed with cycle 7 Keytruda .    Diarrhea C diff negative.  possible Immunotherapy induced. Recommend PRN imodium.    Encounter for antineoplastic immunotherapy Immunotherapy plan as listed above  Orders Placed This Encounter  Procedures   CBC with Differential (Cancer Center Only)    Standing Status:   Future    Expected Date:   04/02/2024    Expiration Date:   04/02/2025   CMP (Cancer Center only)    Standing Status:   Future    Expected Date:   04/02/2024    Expiration Date:   04/02/2025   T4    Standing Status:   Future    Expected Date:   04/02/2024    Expiration Date:   04/02/2025   TSH    Standing Status:   Future    Expected Date:   04/02/2024    Expiration Date:   04/02/2025   CBC with Differential (Cancer Center Only)    Standing Status:   Future    Expected Date:   04/23/2024    Expiration Date:   04/23/2025   CMP (Cancer Center only)    Standing Status:   Future    Expected Date:   04/23/2024    Expiration Date:   04/23/2025   T4    Standing  Status:   Future    Expected Date:   04/23/2024    Expiration Date:   04/23/2025   TSH    Standing Status:   Future    Expected Date:   04/23/2024    Expiration Date:   04/23/2025   Follow up 3 weeks.  All questions were answered. The patient knows to call the clinic with any problems, questions or concerns.  Call Babara, MD, PhD [facility] Hematology Oncology 03/12/2024    HISTORY OF PRESENTING ILLNESS:  Penny Gomez 39 y.o. female presents to establish care for left triple negative breast cancer I have reviewed her chart and materials related to her cancer extensively and collaborated history with the patient. Summary of oncologic history is as follows: Oncology History  Invasive carcinoma of breast (HCC)  07/24/2022 Mammogram   She noticed left breast mass for 1 month.  Bilateral diagnostic mammogram showed Suspicious palpable left breast mass 8 o'clock position. Cortically thickened left axillary lymph node   10/10/2022 Initial Diagnosis   Invasive carcinoma of breast (HCC)  10/02/2022  Left breast mass biopsy and left axillary lymph node biopsy.  Diagnosis 1. Breast, left, needle core biopsy, 8 o'clock 6 cmfn, heart clip - INVASIVE MAMMARY CARCINOMA, NO SPECIAL TYPE. - TUBULE  FORMATION: SCORE 3 - NUCLEAR PLEOMORPHISM: SCORE 3 - MITOTIC COUNT: SCORE 3 - TOTAL SCORE: 9 - OVERALL GRADE: 3 - LYMPHOVASCULAR INVASION: NOT IDENTIFIED - CANCER LENGTH: 11 MM - CALCIFICATIONS: NOT IDENTIFIED - DUCTAL CARCINOMA IN SITU: PRESENT, HIGH-GRADE - ER-, PR- HER2 - [IHC 1+] 2. Lymph node, needle/core biopsy, left axillary, hydromark - LYMPH NODE WITH REACTIVE CHANGES; NEGATIVE FOR MALIGNANCY.  Menarche at age of 65 First live birth at age of 75 OCP use: no History of hysterectomy: no Menopausal status: premenopausal History of HRT use: no History of chest radiation: no Number of previous breast biopsies:  right breast biopsy 05/20/2019 negative for malignancy Strong family history of  cancer mother breast cancer diagnosed in 70s, maternal aunts x 2 breast cancer, maternal cousins breast cancer x 2, maternal grandmother pancreatic cancer.    10/10/2022 Cancer Staging   Staging form: Breast, AJCC 8th Edition - Clinical stage from 10/10/2022: Stage IIIB (cT3, cN0, cM0, G3, ER-, PR-, HER2-) - Signed by Babara Call, MD on 10/18/2022 Stage prefix: Initial diagnosis Nuclear grade: G3 Histologic grading system: 3 grade system   10/17/2022 Imaging   Bilateral MRI breasts w wo contrast  1. In total, there is approximately 6.7 cm of abnormal enhancement, predominantly in the lower-inner inferior left breast, with the ultrasound-guided biopsy marking clip centrally positioned within the enhancement. There are scattered small enhancing masses in the lower inner and lower outer anterior left (included in the measurement above).   2.  No evidence of right breast malignancy.   3. The axillary lymph nodes are not included within the field of view of this MRI for adequate assessment of lymphadenopathy.    10/17/2022 Procedure   S/p medi port placement.    10/25/2022 - 10/25/2022 Chemotherapy   Patient is on Treatment Plan : BREAST ADJUVANT DOSE DENSE AC q14d / PACLitaxel  q7d     10/25/2022 - 05/19/2023 Chemotherapy   Patient is on Treatment Plan : BREAST Pembrolizumab  (200) D1 + Carboplatin  (5) D1 + Paclitaxel  (80) D1,8,15 q21d X 4 cycles / Pembrolizumab  (200) D1 + AC D1 q21d x 4 cycles      Genetic Testing   Single pathogenic variant in BRCA1 called c.190T>G identified on the [facility] Multi-Cancer+RNA panel. The remainder of testing was normal. The report date is 11/11/2022.  The Multi-Cancer + RNA Panel offered by [facility] includes sequencing and/or deletion/duplication analysis of the following 70 genes:  AIP*, ALK, APC*, ATM*, AXIN2*, BAP1*, BARD1*, BLM*, BMPR1A*, BRCA1*, BRCA2*, BRIP1*, CDC73*, CDH1*, CDK4, CDKN1B*, CDKN2A, CHEK2*, CTNNA1*, DICER1*, EPCAM, EGFR, FH*, FLCN*, GREM1, HOXB13, KIT,  LZTR1, MAX*, MBD4, MEN1*, MET, MITF, MLH1*, MSH2*, MSH3*, MSH6*, MUTYH*, NF1*, NF2*, NTHL1*, PALB2*, PDGFRA, PMS2*, POLD1*, POLE*, POT1*, PRKAR1A*, PTCH1*, PTEN*, RAD51C*, RAD51D*, RB1*, RET, SDHA*, SDHAF2*, SDHB*, SDHC*, SDHD*, SMAD4*, SMARCA4*, SMARCB1*, SMARCE1*, STK11*, SUFU*, TMEM127*, TP53*, TSC1*, TSC2*, VHL*. RNA analysis is performed for * genes.   11/24/2022 - 11/25/2022 Hospital Admission   Admitted due to acute pulmonary embolism, lower extremity US  negative for DVT.  Patietn was started on heparin  gtt and transitioned to Eliquis  at discharge.    02/10/2023 Procedure   S/p port placement.    03/10/2023 Imaging   MRI bilateral breast w wo contrast  No abnormal enhancement in the left breast consistent with response to neoadjuvant chemotherapy.   07/24/2023 Surgery   Patient underwent bilateral mastectomy 1. Breast, simple mastectomy, left, long stitch lateral, short stitch superior :      - CHANGES CONSISTENT WITH NEOADJUVANT THERAPY, NEGATIVE FOR RESIDUAL  MALIGNANCY.      - SEE NOTE.      - SCLEROSING ADENOSIS AND SECRETORY CHANGE.      - BIOPSY SITE CHANGE WITH CLIP.      - UNREMARKABLE NIPPLE AND SKIN.       2. Breast, simple mastectomy, right, long stitch lateral, short stitch superior :      - SCLEROSING ADENOSIS, FIBROUS STROMA, AND FIBROADENOMATOID CHANGE.      - UNREMARKABLE NIPPLE AND SKIN.      - NEGATIVE FOR ATYPIA AND MALIGNANCY.       3. Lymph node, sentinel, biopsy, left axillary #1 :      - TWO LYMPH NODES NEGATIVE FOR MALIGNANCY (0/2).      - SEE NOTE.      - BIOPSY SITE CHANGE INVOLVING ONE LYMPH NODE.      - FIBROUS SCARRING / CHANGES SUGGESTIVE OF NEOADJUVANT THERAPY ARE NOT      IDENTIFIED.       4. Breast, excision, left lateral margin :      - UNREMARKABLE BREAST TISSUE.      - NEGATIVE FOR ATYPIA AND MALIGNANCY.  The findings in the left simple mastectomy (specimen 1) are       consistent with a complete pathologic response to neoadjuvant  therapy.Based on       these findings and those in the left axillary sentinel lymph node excision       (specimen 3) the AJCC staging is ypT0 ypN0 (sn)     08/22/2023 -  Chemotherapy   Patient is on Treatment Plan : BREAST Pembrolizumab  (200) q21d x 27 weeks      S/p bilateral mastectomy, ongoing plastic surgery  for reconstruction. S/p post mastectomy radiation.  S/p TSH BSO in December 2025  Today she presents for evaluation prior to immunotherapy.  Occasional diarrhea, 1-2 episodes every 2 days.  Today she denies SOB, abdominal pain. No additional episode of blood in stool.    MEDICAL HISTORY:  Past Medical History:  Diagnosis Date   Acute pulmonary embolism (HCC)    BRCA1 gene mutation positive    Breast lump    Chronic anticoagulation    Endocarditis    Fibroid, uterine    Immunosuppressed due to chemotherapy    Microcytic anemia    MSSA bacteremia    Multifocal pneumonia    Sepsis (HCC)    due to port a cath   STD (sexually transmitted disease)    From Medical Hx;   Thrombocytopenia    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   Vascular port complication     SURGICAL HISTORY: Past Surgical History:  Procedure Laterality Date   BREAST BIOPSY Right 05/20/2019   Affirm Bx- X- clip, neg   BREAST BIOPSY Left 10/02/2022   US  Bx, path pending   BREAST BIOPSY Left 10/02/2022   Us  Bx Node- path pending   BREAST BIOPSY Left 10/02/2022   US  LT BREAST BX W LOC DEV 1ST LESION IMG BX SPEC US  GUIDE 10/02/2022 [facility]   BREAST RECONSTRUCTION Bilateral 07/24/2023   Procedure: RECONSTRUCTION, BREAST;  Surgeon: Lowery Estefana RAMAN, DO;  Location: [facility];  Service: Plastics;  Laterality: Bilateral;  BILATERAL IMMEDIATE BREAST RECONSTRUCTION WITH EXPANDER AND FLEX HD PLACEMENT   CESAREAN SECTION     x 2   CYSTOSCOPY N/A 01/28/2024   Procedure: CYSTOSCOPY;  Surgeon: Verdon Keen, MD;  Location: [facility];  Service: Gynecology;  Laterality: N/A;   IR CV LINE  INJECTION  01/27/2024   IR IMAGING GUIDED PORT INSERTION  02/11/2024   IR REMOVAL TUN ACCESS W/ PORT W/O FL MOD SED  02/11/2024   LAPAROSCOPIC BILATERAL SALPINGECTOMY Bilateral 02/09/2022   Procedure: LAPAROSCOPIC BILATERAL SALPINGECTOMY;  Surgeon: Connell Davies, MD;  Location: [facility];  Service: Gynecology;  Laterality: Bilateral;   MASTECTOMY W/ SENTINEL NODE BIOPSY Bilateral 07/24/2023   Procedure: MASTECTOMY WITH SENTINEL LYMPH NODE BIOPSY;  Surgeon: Tye Millet, DO;  Location: [facility];  Service: General;  Laterality: Bilateral;  SN biopsy left only   PORTACATH PLACEMENT Right 10/17/2022   Procedure: INSERTION PORT-A-CATH;  Surgeon: Desiderio Schanz, MD;  Location: [facility];  Service: General;  Laterality: Right;   PORTACATH PLACEMENT Left 02/10/2023   Procedure: INSERTION PORT-A-CATH;  Surgeon: Tye Millet, DO;  Location: [facility];  Service: General;  Laterality: Left;   REMOVAL OF BILATERAL TISSUE EXPANDERS WITH PLACEMENT OF BILATERAL BREAST IMPLANTS Bilateral 09/11/2023   Procedure: REMOVAL, TISSUE EXPANDER, BREAST, BILATERAL, WITH BILATERAL IMPLANT IMPLANT INSERTION;  Surgeon: Lowery Estefana RAMAN, DO;  Location: [facility];  Service: Plastics;  Laterality: Bilateral;  right expander removal and replacement.   ROBOTIC ASSISTED LAPAROSCOPIC LYSIS OF ADHESION N/A 01/28/2024   Procedure: LYSIS, ADHESIONS, ROBOT-ASSISTED, LAPAROSCOPIC;  Surgeon: Verdon Keen, MD;  Location: [facility];  Service: Gynecology;  Laterality: N/A;   ROBOTIC ASSISTED TOTAL HYSTERECTOMY WITH BILATERAL SALPINGO OOPHERECTOMY Bilateral 01/28/2024   Procedure: HYSTERECTOMY, TOTAL, ROBOT-ASSISTED, LAPAROSCOPIC, WITH BILATERAL SALPINGO-OOPHORECTOMY;  Surgeon: Verdon Keen, MD;  Location: [facility];  Service: Gynecology;  Laterality: Bilateral;  Vaginal Morcellation, Ureteral Lysis, and Modified McCall's   TEE WITHOUT CARDIOVERSION N/A 01/02/2023   Procedure: TRANSESOPHAGEAL ECHOCARDIOGRAM (TEE);  Surgeon:  Perla Evalene PARAS, MD;  Location: [facility];  Service: Cardiovascular;  Laterality: N/A;   TUBAL LIGATION      SOCIAL HISTORY: Social History   Socioeconomic History   Marital status: Single    Spouse name: Not on file   Number of children: 2   Years of education: Not on file   Highest education level: High school graduate  Occupational History   Not on file  Tobacco Use   Smoking status: Never    Passive exposure: Never   Smokeless tobacco: Never  Vaping Use   Vaping status: Never Used  Substance and Sexual Activity   Alcohol use: Never   Drug use: No   Sexual activity: Yes    Birth control/protection: Surgical  Other Topics Concern   Not on file  Social History Narrative   Lives with mom and 2 kids   Social Drivers of Health   Tobacco Use: Low Risk (03/12/2024)   Patient History    Smoking Tobacco Use: Never    Smokeless Tobacco Use: Never    Passive Exposure: Never  Financial Resource Strain: Low Risk  (11/21/2023)   [facility]   Overall Financial Resource Strain (CARDIA)    Difficulty of Paying Living Expenses: Not hard at all  Food Insecurity: No Food Insecurity (11/21/2023)   Received from [facility]   Epic    Within the past 12 months, you worried that your food would run out before you got the money to buy more.: Never true    Within the past 12 months, the food you bought just didn't last and you didn't have money to get more.: Never true  Transportation Needs: No Transportation Needs (11/21/2023)   Received from [facility]   [facility] - Transportation  In the past 12 months, has lack of transportation kept you from medical appointments or from getting medications?: No    Lack of Transportation (Non-Medical): No  Physical Activity: Not on file  Stress: No Stress Concern Present (10/10/2022)   [employer] - Occupational Stress Questionnaire    Feeling of  Stress : Only a little  Social Connections: Not on file  Intimate Partner Violence: Not At Risk (12/31/2022)   Humiliation, Afraid, Rape, and Kick questionnaire    Fear of Current or Ex-Partner: No    Emotionally Abused: No    Physically Abused: No    Sexually Abused: No  Depression (PHQ2-9): Low Risk (03/12/2024)   Depression (PHQ2-9)    PHQ-2 Score: 0  Alcohol Screen: Not on file  Housing: Low Risk  (11/21/2023)   Received from [facility]   Epic    In the last 12 months, was there a time when you were not able to pay the mortgage or rent on time?: No    In the past 12 months, how many times have you moved where you were living?: 0    At any time in the past 12 months, were you homeless or living in a shelter (including now)?: No  Utilities: Not At Risk (11/21/2023)   Received from [facility]   Epic    In the past 12 months has the electric, gas, oil, or water  company threatened to shut off services in your home?: No  Health Literacy: Adequate Health Literacy (10/10/2022)   B1300 Health Literacy    Frequency of need for help with medical instructions: Never    FAMILY HISTORY: Family History  Problem Relation Age of Onset   Breast cancer Mother 50       no GT   Breast cancer Maternal Aunt 33   Breast cancer Maternal Aunt    Lung cancer Maternal Uncle    Lung cancer Maternal Uncle    Bone cancer Paternal Aunt    Pancreatic cancer Maternal Grandmother 79   Lung cancer Maternal Grandfather 86   Breast cancer Cousin 40   Breast cancer Other 33       gene pos?    ALLERGIES:  has no known allergies.  MEDICATIONS:  Current Outpatient Medications  Medication Sig Dispense Refill   Calcium -Phosphorus-Vitamin D (CALCIUM  GUMMIES PO) Take 2 each by mouth in the morning. (Patient not taking: Reported on 03/12/2024)     oxyCODONE  (OXY IR/ROXICODONE ) 5 MG immediate release tablet Take 1 tablet (5 mg total) by mouth every 4 (four) hours as needed  for severe pain (pain score 7-10). (Patient not taking: Reported on 03/12/2024) 15 tablet 0   potassium chloride  SA (KLOR-CON  M) 20 MEQ tablet Take 1 tablet (20 mEq total) by mouth daily. (Patient not taking: Reported on 03/12/2024) 3 tablet 0   senna (SENOKOT) 8.6 MG TABS tablet Take 2 tablets (17.2 mg total) by mouth daily. (Patient not taking: Reported on 03/12/2024) 120 tablet 2   No current facility-administered medications for this visit.   Facility-Administered Medications Ordered in Other Visits  Medication Dose Route Frequency Provider Last Rate Last Admin   0.9 %  sodium chloride  infusion   Intravenous Continuous Babara Call, MD   Stopped at 03/12/24 1039    Review of Systems  Constitutional:  Negative for appetite change, chills, fatigue and fever.  HENT:   Negative for hearing loss and voice change.   Eyes:  Negative for eye problems.  Respiratory:  Negative for chest tightness and cough.   Cardiovascular:  Negative for chest pain.  Gastrointestinal:  Positive for blood in stool. Negative for abdominal distention and abdominal pain.  Endocrine: Negative for hot flashes.  Genitourinary:  Negative for difficulty urinating and frequency.   Musculoskeletal:  Negative for arthralgias.  Skin:  Negative for itching and rash.  Neurological:  Negative for extremity weakness and headaches.  Hematological:  Negative for adenopathy.  Psychiatric/Behavioral:  Negative for confusion.      PHYSICAL EXAMINATION: ECOG PERFORMANCE STATUS: 0 - Asymptomatic  Vitals:   03/12/24 0837  BP: 118/82  Pulse: 61  Resp: 18  Temp: (!) 96.6 F (35.9 C)  SpO2: 100%   Filed Weights   03/12/24 0837  Weight: 134 lb 14.4 oz (61.2 kg)    Physical Exam Constitutional:      General: She is not in acute distress.    Appearance: She is not diaphoretic.  HENT:     Head: Normocephalic and atraumatic.  Eyes:     General: No scleral icterus. Cardiovascular:     Rate and Rhythm: Normal rate and regular  rhythm.  Pulmonary:     Effort: Pulmonary effort is normal. No respiratory distress.     Breath sounds: No wheezing.  Abdominal:     General: Bowel sounds are normal. There is no distension.     Palpations: Abdomen is soft.  Musculoskeletal:        General: Normal range of motion.     Cervical back: Normal range of motion and neck supple.  Skin:    General: Skin is warm and dry.     Findings: No erythema.     Comments: Left anterior chest wall medi port  Neurological:     Mental Status: She is alert and oriented to person, place, and time. Mental status is at baseline.     Motor: No abnormal muscle tone.  Psychiatric:        Mood and Affect: Mood and affect normal.     LABORATORY DATA:  I have reviewed the data as listed    Latest Ref Rng & Units 03/12/2024    8:21 AM 02/20/2024    8:10 AM 02/09/2024    9:18 AM  CBC  WBC 4.0 - 10.5 K/uL 6.0  4.7  5.9   Hemoglobin 12.0 - 15.0 g/dL 87.9  88.7  88.2   Hematocrit 36.0 - 46.0 % 37.5  36.1  36.8   Platelets 150 - 400 K/uL 184  192  241       Latest Ref Rng & Units 03/12/2024    8:21 AM 02/20/2024    8:10 AM 02/09/2024    9:18 AM  CMP  Glucose 70 - 99 mg/dL 874  832  63   BUN 6 - 20 mg/dL 8  9  7    Creatinine 0.44 - 1.00 mg/dL 9.47  9.30  9.42   Sodium 135 - 145 mmol/L 141  141  141   Potassium 3.5 - 5.1 mmol/L 3.5  3.4  3.6   Chloride 98 - 111 mmol/L 107  107  105   CO2 22 - 32 mmol/L 23  22  24    Calcium  8.9 - 10.3 mg/dL 9.3  9.4  9.4   Total Protein 6.5 - 8.1 g/dL 7.0  7.1  7.4   Total Bilirubin 0.0 - 1.2 mg/dL 0.3  0.2  <9.7   Alkaline Phos 38 - 126 U/L 121  113  108  AST 15 - 41 U/L 27  28  31    ALT 0 - 44 U/L 28  36  32      RADIOGRAPHIC STUDIES: I have personally reviewed the radiological images as listed and agreed with the findings in the report. IR IMAGING GUIDED PORT INSERTION Result Date: 02/11/2024 CLINICAL DATA:  Left-sided breast cancer. Malfunctioning left-sided chest port. Planned placement of a  right-sided chest port for durable venous access and ongoing therapy. EXAM: Chest port catheter placement TECHNIQUE: Procedure performed using fluoroscopy and ultrasound CONTRAST:  None RADIOPHARMACEUTICALS:  None FLUOROSCOPY: 1.7  MGy COMPARISON:  None FINDINGS: The patient was placed in supine position on the IR gantry and the right upper chest and neck were prepped and draped in the usual sterile fashion. The nurse administered intravenous fentanyl  and Versed  under my supervision and the nurse had no other injuries other than monitoring the patient and administering medications. I was present for the entire duration of procedure. 2 mg intravenous Versed  and 100 mcg intravenous fentanyl  were administered for a total sedation time of 42 minutes. Ultrasound guidance was used to investigate the right internal jugular vein which was anechoic and compressible indicating patency. The needle was then advanced from a scan negative through the soft tissue into the right internal jugular vein under ultrasound guidance. A final image was obtained and stored in the patient's permanent medical record. Access was then exchanged over a guidewire which was advanced under fluoroscopic guidance. The needle was removed and replaced with a micropuncture sheath. Approximately 2 inches below the clavicle the port pocket was created with a subsequent incision. The catheter was then tunneled from the port pocket to the venotomy site overlying the right internal jugular vein. Access was then exchanged over an 035 guidewire for peel-away sheath which was advanced over the guidewire under fluoroscopic guidance. The catheter was then advanced through the peel-away sheath to the sinoatrial junction. Sheath was removed. The catheter was then cut at the port pocket and connected to chest port. The chest port was tested for function and finally function well. The chest port was then flushed with heparin  and a port pocket was closed with 4-0  suture. Final image was obtained demonstrating satisfactory position of chest port. The final count of all materials was satisfactory. IMPRESSION: 1. Satisfactory placement of right internal jugular vein single-lumen chest port. Catheter tip at the cavoatrial junction. 2.  Okay to use and power inject chest port. Electronically Signed   By: Cordella Banner   On: 02/11/2024 13:55   IR REMOVAL TUN ACCESS W/ PORT W/O FL MOD SED Result Date: 02/11/2024 INDICATION: Malpositioned and partially retracted left-sided chest port. Chest port not functioning. Planned removal. EXAM: REMOVAL left IJ VEIN PORT-A-CATH MEDICATIONS: Ancef  2 g IV; The antibiotic was administered within an appropriate time interval prior to skin puncture. ANESTHESIA/SEDATION: Moderate (conscious) sedation was employed during this procedure. A total of Versed  2 mg and Fentanyl  100 mcg was administered intravenously by the radiology nurse. Total intra-service moderate Sedation Time: 42 minutes. The patient's level of consciousness and vital signs were monitored continuously by radiology nursing throughout the procedure under my direct supervision. COMPLICATIONS: None immediate. PROCEDURE: Informed written consent was obtained from the patient after a thorough discussion of the procedural risks, benefits and alternatives. All questions were addressed. Maximal Sterile Barrier Technique was utilized including caps, mask, sterile gowns, sterile gloves, sterile drape, hand hygiene and skin antiseptic. A timeout was performed prior to the initiation of the procedure. The left  chest was prepped and draped in a sterile fashion. Lidocaine  was utilized for local anesthesia. An incision was made over the previously healed surgical incision. Utilizing blunt dissection, the port catheter and reservoir were removed from the underlying subcutaneous tissue in their entirety. Securing sutures were also removed. The pocket was irrigated with a copious amount of  sterile normal saline. The pocket was closed with interrupted 3-0 Vicryl stitches. The subcutaneous tissue was closed with 3-0 Vicryl interrupted subcutaneous stitches. A 4-0 Vicryl running subcuticular stitch was utilized to approximate the skin. Dermabond was applied. IMPRESSION: Successful left IJ vein Port-A-Cath explant. Electronically Signed   By: Cordella Banner   On: 02/11/2024 13:48      "

## 2024-03-14 ENCOUNTER — Other Ambulatory Visit: Payer: Self-pay

## 2024-03-23 ENCOUNTER — Ambulatory Visit

## 2024-04-02 ENCOUNTER — Inpatient Hospital Stay

## 2024-04-02 ENCOUNTER — Inpatient Hospital Stay: Admitting: Oncology

## 2024-04-23 ENCOUNTER — Inpatient Hospital Stay

## 2024-04-23 ENCOUNTER — Inpatient Hospital Stay: Admitting: Oncology
